# Patient Record
Sex: Female | Born: 1935 | Race: White | Hispanic: No | Smoking: Never smoker
Health system: Southern US, Community
[De-identification: ages and names within clinical notes are randomized; demographics above are authoritative.]

## PROBLEM LIST (undated history)

## (undated) DIAGNOSIS — J449 Chronic obstructive pulmonary disease, unspecified: Secondary | ICD-10-CM

## (undated) DIAGNOSIS — E46 Unspecified protein-calorie malnutrition: Secondary | ICD-10-CM

## (undated) DIAGNOSIS — J9620 Acute and chronic respiratory failure, unspecified whether with hypoxia or hypercapnia: Secondary | ICD-10-CM

## (undated) DIAGNOSIS — R600 Localized edema: Secondary | ICD-10-CM

## (undated) DIAGNOSIS — E039 Hypothyroidism, unspecified: Secondary | ICD-10-CM

## (undated) DIAGNOSIS — K589 Irritable bowel syndrome without diarrhea: Secondary | ICD-10-CM

## (undated) DIAGNOSIS — I10 Essential (primary) hypertension: Secondary | ICD-10-CM

## (undated) DIAGNOSIS — D509 Iron deficiency anemia, unspecified: Secondary | ICD-10-CM

## (undated) DIAGNOSIS — E079 Disorder of thyroid, unspecified: Secondary | ICD-10-CM

## (undated) DIAGNOSIS — R2681 Unsteadiness on feet: Secondary | ICD-10-CM

## (undated) DIAGNOSIS — I4891 Unspecified atrial fibrillation: Secondary | ICD-10-CM

## (undated) DIAGNOSIS — G4700 Insomnia, unspecified: Secondary | ICD-10-CM

## (undated) DIAGNOSIS — F329 Major depressive disorder, single episode, unspecified: Secondary | ICD-10-CM

## (undated) DIAGNOSIS — R1310 Dysphagia, unspecified: Secondary | ICD-10-CM

## (undated) DIAGNOSIS — N189 Chronic kidney disease, unspecified: Secondary | ICD-10-CM

## (undated) DIAGNOSIS — H532 Diplopia: Secondary | ICD-10-CM

## (undated) DIAGNOSIS — B370 Candidal stomatitis: Secondary | ICD-10-CM

## (undated) DIAGNOSIS — D72829 Elevated white blood cell count, unspecified: Secondary | ICD-10-CM

## (undated) DIAGNOSIS — J189 Pneumonia, unspecified organism: Secondary | ICD-10-CM

## (undated) DIAGNOSIS — R5381 Other malaise: Secondary | ICD-10-CM

## (undated) DIAGNOSIS — K224 Dyskinesia of esophagus: Secondary | ICD-10-CM

## (undated) DIAGNOSIS — F32A Depression, unspecified: Secondary | ICD-10-CM

## (undated) DIAGNOSIS — G7000 Myasthenia gravis without (acute) exacerbation: Secondary | ICD-10-CM

## (undated) DIAGNOSIS — K5900 Constipation, unspecified: Secondary | ICD-10-CM

## (undated) DIAGNOSIS — I480 Paroxysmal atrial fibrillation: Secondary | ICD-10-CM

## (undated) DIAGNOSIS — J309 Allergic rhinitis, unspecified: Secondary | ICD-10-CM

## (undated) DIAGNOSIS — R42 Dizziness and giddiness: Secondary | ICD-10-CM

## (undated) DIAGNOSIS — D649 Anemia, unspecified: Secondary | ICD-10-CM

## (undated) DIAGNOSIS — K219 Gastro-esophageal reflux disease without esophagitis: Secondary | ICD-10-CM

## (undated) HISTORY — PX: ABDOMINAL HYSTERECTOMY: SHX81

## (undated) HISTORY — DX: Diplopia: H53.2

## (undated) HISTORY — DX: Insomnia, unspecified: G47.00

## (undated) HISTORY — DX: Major depressive disorder, single episode, unspecified: F32.9

## (undated) HISTORY — DX: Myasthenia gravis without (acute) exacerbation: G70.00

## (undated) HISTORY — DX: Dyskinesia of esophagus: K22.4

## (undated) HISTORY — DX: Hypothyroidism, unspecified: E03.9

## (undated) HISTORY — PX: APPENDECTOMY: SHX54

## (undated) HISTORY — PX: TOTAL KNEE ARTHROPLASTY: SHX125

## (undated) HISTORY — DX: Dysphagia, unspecified: R13.10

## (undated) HISTORY — DX: Iron deficiency anemia, unspecified: D50.9

## (undated) HISTORY — DX: Unspecified atrial fibrillation: I48.91

## (undated) HISTORY — DX: Chronic kidney disease, unspecified: N18.9

## (undated) HISTORY — DX: Candidal stomatitis: B37.0

## (undated) HISTORY — PX: TOTAL HIP ARTHROPLASTY: SHX124

## (undated) HISTORY — DX: Acute and chronic respiratory failure, unspecified whether with hypoxia or hypercapnia: J96.20

## (undated) HISTORY — DX: Unsteadiness on feet: R26.81

## (undated) HISTORY — DX: Depression, unspecified: F32.A

## (undated) HISTORY — DX: Other malaise: R53.81

## (undated) HISTORY — DX: Allergic rhinitis, unspecified: J30.9

## (undated) HISTORY — DX: Unspecified protein-calorie malnutrition: E46

## (undated) HISTORY — DX: Paroxysmal atrial fibrillation: I48.0

## (undated) HISTORY — DX: Constipation, unspecified: K59.00

## (undated) HISTORY — DX: Gastro-esophageal reflux disease without esophagitis: K21.9

## (undated) HISTORY — DX: Irritable bowel syndrome, unspecified: K58.9

## (undated) HISTORY — DX: Localized edema: R60.0

## (undated) HISTORY — DX: Pneumonia, unspecified organism: J18.9

## (undated) HISTORY — DX: Disorder of thyroid, unspecified: E07.9

## (undated) HISTORY — DX: Elevated white blood cell count, unspecified: D72.829

## (undated) HISTORY — DX: Anemia, unspecified: D64.9

## (undated) HISTORY — DX: Chronic obstructive pulmonary disease, unspecified: J44.9

---

## 2011-05-15 DIAGNOSIS — R1900 Intra-abdominal and pelvic swelling, mass and lump, unspecified site: Secondary | ICD-10-CM | POA: Insufficient documentation

## 2011-05-15 DIAGNOSIS — Z800 Family history of malignant neoplasm of digestive organs: Secondary | ICD-10-CM | POA: Insufficient documentation

## 2011-10-05 DIAGNOSIS — I471 Supraventricular tachycardia: Secondary | ICD-10-CM | POA: Insufficient documentation

## 2011-10-05 DIAGNOSIS — I4719 Other supraventricular tachycardia: Secondary | ICD-10-CM | POA: Insufficient documentation

## 2012-03-24 DIAGNOSIS — Z0000 Encounter for general adult medical examination without abnormal findings: Secondary | ICD-10-CM | POA: Insufficient documentation

## 2012-09-07 DIAGNOSIS — R296 Repeated falls: Secondary | ICD-10-CM | POA: Insufficient documentation

## 2013-11-19 DIAGNOSIS — I272 Pulmonary hypertension, unspecified: Secondary | ICD-10-CM | POA: Insufficient documentation

## 2013-11-22 DIAGNOSIS — W19XXXA Unspecified fall, initial encounter: Secondary | ICD-10-CM | POA: Insufficient documentation

## 2013-11-22 DIAGNOSIS — Y92009 Unspecified place in unspecified non-institutional (private) residence as the place of occurrence of the external cause: Secondary | ICD-10-CM

## 2013-11-24 DIAGNOSIS — R001 Bradycardia, unspecified: Secondary | ICD-10-CM | POA: Insufficient documentation

## 2014-07-01 DIAGNOSIS — N390 Urinary tract infection, site not specified: Secondary | ICD-10-CM | POA: Insufficient documentation

## 2014-08-13 ENCOUNTER — Emergency Department (HOSPITAL_COMMUNITY)
Admission: EM | Admit: 2014-08-13 | Discharge: 2014-08-14 | Disposition: A | Discharge status: Home / Self Care | Payer: Medicare Other | Attending: Emergency Medicine | Admitting: Emergency Medicine

## 2014-08-13 ENCOUNTER — Emergency Department (HOSPITAL_COMMUNITY): Payer: Medicare Other

## 2014-08-13 ENCOUNTER — Encounter (HOSPITAL_COMMUNITY): Payer: Self-pay | Admitting: *Deleted

## 2014-08-13 DIAGNOSIS — Y998 Other external cause status: Secondary | ICD-10-CM | POA: Diagnosis not present

## 2014-08-13 DIAGNOSIS — W1830XA Fall on same level, unspecified, initial encounter: Secondary | ICD-10-CM | POA: Insufficient documentation

## 2014-08-13 DIAGNOSIS — Y92009 Unspecified place in unspecified non-institutional (private) residence as the place of occurrence of the external cause: Secondary | ICD-10-CM | POA: Diagnosis not present

## 2014-08-13 DIAGNOSIS — R300 Dysuria: Secondary | ICD-10-CM | POA: Insufficient documentation

## 2014-08-13 DIAGNOSIS — R0602 Shortness of breath: Secondary | ICD-10-CM | POA: Insufficient documentation

## 2014-08-13 DIAGNOSIS — W19XXXA Unspecified fall, initial encounter: Secondary | ICD-10-CM

## 2014-08-13 DIAGNOSIS — Y9389 Activity, other specified: Secondary | ICD-10-CM | POA: Diagnosis not present

## 2014-08-13 DIAGNOSIS — S0990XA Unspecified injury of head, initial encounter: Secondary | ICD-10-CM | POA: Diagnosis not present

## 2014-08-13 DIAGNOSIS — S199XXA Unspecified injury of neck, initial encounter: Secondary | ICD-10-CM | POA: Insufficient documentation

## 2014-08-13 HISTORY — DX: Disorder of thyroid, unspecified: E07.9

## 2014-08-13 HISTORY — DX: Essential (primary) hypertension: I10

## 2014-08-13 HISTORY — DX: Dizziness and giddiness: R42

## 2014-08-13 LAB — CBC
HCT: 37.5 % (ref 36.0–46.0)
Hemoglobin: 11.6 g/dL — ABNORMAL LOW (ref 12.0–15.0)
MCH: 25.8 pg — AB (ref 26.0–34.0)
MCHC: 30.9 g/dL (ref 30.0–36.0)
MCV: 83.3 fL (ref 78.0–100.0)
PLATELETS: 148 10*3/uL — AB (ref 150–400)
RBC: 4.5 MIL/uL (ref 3.87–5.11)
RDW: 14.8 % (ref 11.5–15.5)
WBC: 8.8 10*3/uL (ref 4.0–10.5)

## 2014-08-13 LAB — BASIC METABOLIC PANEL
ANION GAP: 9 (ref 5–15)
BUN: 20 mg/dL (ref 6–20)
CALCIUM: 8.9 mg/dL (ref 8.9–10.3)
CO2: 26 mmol/L (ref 22–32)
Chloride: 103 mmol/L (ref 101–111)
Creatinine, Ser: 0.59 mg/dL (ref 0.44–1.00)
GLUCOSE: 123 mg/dL — AB (ref 65–99)
POTASSIUM: 3.4 mmol/L — AB (ref 3.5–5.1)
Sodium: 138 mmol/L (ref 135–145)

## 2014-08-13 LAB — URINALYSIS, ROUTINE W REFLEX MICROSCOPIC
BILIRUBIN URINE: NEGATIVE
GLUCOSE, UA: NEGATIVE mg/dL
Hgb urine dipstick: NEGATIVE
KETONES UR: NEGATIVE mg/dL
NITRITE: NEGATIVE
PH: 6.5 (ref 5.0–8.0)
Protein, ur: NEGATIVE mg/dL
Specific Gravity, Urine: 1.016 (ref 1.005–1.030)
Urobilinogen, UA: 0.2 mg/dL (ref 0.0–1.0)

## 2014-08-13 LAB — URINE MICROSCOPIC-ADD ON

## 2014-08-13 LAB — I-STAT TROPONIN, ED: Troponin i, poc: 0.01 ng/mL (ref 0.00–0.08)

## 2014-08-13 LAB — D-DIMER, QUANTITATIVE (NOT AT ARMC): D DIMER QUANT: 2.18 ug{FEU}/mL — AB (ref 0.00–0.48)

## 2014-08-13 MED ORDER — SODIUM CHLORIDE 0.9 % IV BOLUS (SEPSIS)
1000.0000 mL | Freq: Once | INTRAVENOUS | Status: AC
  Administered 2014-08-13: 1000 mL via INTRAVENOUS

## 2014-08-13 MED ORDER — ACETAMINOPHEN 325 MG PO TABS
650.0000 mg | ORAL_TABLET | Freq: Once | ORAL | Status: AC
Start: 2014-08-13 — End: 2014-08-13
  Administered 2014-08-13: 650 mg via ORAL
  Filled 2014-08-13: qty 2

## 2014-08-13 MED ORDER — IOHEXOL 350 MG/ML SOLN
100.0000 mL | Freq: Once | INTRAVENOUS | Status: AC | PRN
  Administered 2014-08-13: 100 mL via INTRAVENOUS

## 2014-08-13 NOTE — ED Provider Notes (Signed)
CSN: [id number]     Arrival date & time 08/13/14  1940 History   First MD Initiated Contact with Patient 08/13/14 2007     Chief Complaint  Patient presents with  . Fall     (Consider location/radiation/quality/duration/timing/severity/associated sxs/prior Treatment) HPI  81 year old female presents from home after a fall. She states she has a long-standing history of vertigo and has been getting dizzy more more often. These falls have been ongoing for several months. Usually seen at [facility]. She states she's been taking her Antivert but with no relief. Today she has fallen 3 times, states that she is walking and it seems to feel lightheaded, no room spinning sensation. She denies passing out, but also seems to be having a hard time telling exactly what happened. She states she has been getting short of breath when this happens. Denies chest pain but does seem to think there is some palpitations involved. Denies a known history of arrhythmia or heart problems but is currently on amiodarone and she does not know why. The patient is a difficult historian. Does endorse dysuria but states she's had a UTI for over one year.  No past medical history on file. No past surgical history on file. No family history on file. History  Substance Use Topics  . Smoking status: Not on file  . Smokeless tobacco: Not on file  . Alcohol Use: Not on file   OB History    No data available     Review of Systems  Respiratory: Positive for shortness of breath.   Cardiovascular: Negative for chest pain and leg swelling.  Gastrointestinal: Negative for vomiting, abdominal pain and diarrhea.  Genitourinary: Positive for dysuria.  Musculoskeletal: Positive for arthralgias and neck pain.  Neurological: Positive for dizziness, light-headedness and headaches. Negative for syncope.  All other systems reviewed and are negative.     Allergies  Review of patient's allergies indicates not on file.  Home  Medications   Prior to Admission medications   Not on File   BP 154/78 mmHg  Pulse 62  Temp(Src) 98.5 F (36.9 C) (Oral)  Resp 20  SpO2 100% Physical Exam  Constitutional: She is oriented to person, place, and time. She appears well-developed and well-nourished. Cervical collar in place.  HENT:  Head: Normocephalic and atraumatic.  Right Ear: External ear normal.  Left Ear: External ear normal.  Nose: Nose normal.  Eyes: Right eye exhibits no discharge. Left eye exhibits no discharge.  Neck: Neck supple. Spinous process tenderness and muscular tenderness present.  Cardiovascular: Normal rate, regular rhythm and normal heart sounds.   No murmur heard. Pulmonary/Chest: Effort normal and breath sounds normal.  Abdominal: Soft. There is no tenderness.  Neurological: She is alert and oriented to person, place, and time.  CN 2-12 grossly intact. 5/5 strength in all 4 extremities. Normal finger to nose  Skin: Skin is warm and dry.  Vitals reviewed.   ED Course  Procedures (including critical care time) Labs Review Labs Reviewed  CBC - Abnormal; Notable for the following:    Hemoglobin 11.6 (*)    MCH 25.8 (*)    Platelets 148 (*)    All other components within normal limits  BASIC METABOLIC PANEL - Abnormal; Notable for the following:    Potassium 3.4 (*)    Glucose, Bld 123 (*)    All other components within normal limits  D-DIMER, QUANTITATIVE - Abnormal; Notable for the following:    D-Dimer, Quant 2.18 (*)    All  other components within normal limits  URINALYSIS, ROUTINE W REFLEX MICROSCOPIC  I-STAT TROPOININ, ED    Imaging Review Dg Chest 2 View  08/13/2014   CLINICAL DATA:  Dizziness, fall.  EXAM: CHEST  2 VIEW  COMPARISON:  None.  FINDINGS: Heart is borderline in size. No confluent airspace opacities or effusions. No acute bony abnormality.  IMPRESSION: Cardiomegaly.  No active disease.   Electronically Signed   By: Charlett Nose M.D.   On: 08/13/2014 20:39   Dg  Cervical Spine Complete  08/13/2014   CLINICAL DATA:  Patient with dizziness status post fall. Patient stated she hit her head. Complains of headaches.  EXAM: CERVICAL SPINE  4+ VIEWS  COMPARISON:  None.  FINDINGS: Visualization through the T1 vertebral body on the lateral view. Mild grade 1 anterolisthesis of C6 on C7, potentially secondary to degenerative facet changes. Relative preservation of the vertebral body and intervertebral disc space heights. Prevertebral soft tissues are unremarkable. Craniocervical junction is unremarkable. Lateral masses articulate appropriately with the dens. Nonspecific opacification within the right peritracheal region.  IMPRESSION: Markedly limited exam secondary to low bone density and difficulty with patient positioning. Grade 1 anterolisthesis of C6 on C7, potentially secondary to degenerative facet arthropathy. If there is persistent concern for nondisplaced cervical spine fracture, correlation with CT is recommended.  Opacification within the right peritracheal region visualized within the upper thorax. Recommend correlation with dedicated chest radiography and possible CT as clinically indicated.   Electronically Signed   By: Annia Belt M.D.   On: 08/13/2014 20:41   Ct Head Wo Contrast  08/13/2014   CLINICAL DATA:  Dizziness and fall, head injury. Now with headache. Multiple recent falls.  EXAM: CT HEAD WITHOUT CONTRAST  CT CERVICAL SPINE WITHOUT CONTRAST  TECHNIQUE: Multidetector CT imaging of the head and cervical spine was performed following the standard protocol without intravenous contrast. Multiplanar CT image reconstructions of the cervical spine were also generated.  COMPARISON:  None.  FINDINGS: CT HEAD FINDINGS  No intracranial hemorrhage, mass effect, or midline shift. No hydrocephalus. The basilar cisterns are patent. No evidence of territorial infarct. No intracranial fluid collection. Mild cerebral and cerebellar atrophy with moderate chronic small vessel  ischemic change. Calvarium is intact. Minimal opacification of left ethmoid air cells, paranasal sinuses are otherwise clear. Mastoid air cells are well aerated.  CT CERVICAL SPINE FINDINGS  Minimal degenerative-type anterolisthesis of C4 on C5 and C6 on C7. The alignment is otherwise maintained. There is near complete disc space loss and bony fusion of C7-T1, may be congenital. The cervical disc spaces are otherwise preserved, there is disc space narrowing at T1-T2. Vertebral body heights are preserved. There is no fracture. The dens is intact. There are no jumped or perched facets. There is multilevel diffuse facet arthropathy throughout the cervical spine. Mild endplate spurring throughout. No prevertebral soft tissue edema.  IMPRESSION: 1. No acute intracranial abnormality. Mild atrophy and moderate chronic small vessel ischemic change. 2. Degenerative change in the cervical spine without acute fracture or subluxation.   Electronically Signed   By: Rubye Oaks M.D.   On: 08/13/2014 22:34   Ct Angio Chest Pe W/cm &/or Wo Cm  08/14/2014   CLINICAL DATA:  Acute onset of dizziness and syncope. Elevated D-dimer. Initial encounter.  EXAM: CT ANGIOGRAPHY CHEST WITH CONTRAST  TECHNIQUE: Multidetector CT imaging of the chest was performed using the standard protocol during bolus administration of intravenous contrast. Multiplanar CT image reconstructions and MIPs were obtained to evaluate the  vascular anatomy.  CONTRAST:  100mL OMNIPAQUE IOHEXOL 350 MG/ML SOLN  COMPARISON:  Chest radiograph performed earlier today at 08:15 p.m.  FINDINGS: There is no evidence of pulmonary embolus.  Mild bibasilar atelectasis is noted. The lungs are otherwise clear. There is no evidence of significant focal consolidation, pleural effusion or pneumothorax. No masses are identified; no abnormal focal contrast enhancement is seen.  The thoracic aorta is borderline normal in size. Scattered coronary artery calcifications are seen. The  heart is mildly enlarged. Trace pericardial fluid remains within normal limits. The great vessels are grossly unremarkable in appearance. A 1.4 cm right paratracheal node is noted. No additional mediastinal lymphadenopathy is seen. No axillary lymphadenopathy is seen. The visualized portions of the thyroid gland are unremarkable in appearance.  The visualized portions of the liver and spleen are unremarkable.  No acute osseous abnormalities are seen. There is chronic osseous fusion at C7-T1. There is mild chronic compression deformity involving vertebral body T8, and the patient is status post vertebroplasty at T12.  Review of the MIP images confirms the above findings.  IMPRESSION: 1. No evidence of pulmonary embolus. 2. Mild bibasilar atelectasis noted; lungs otherwise clear. 3. Mild cardiomegaly. Scattered coronary artery calcifications seen. 4. 1.4 cm right paratracheal node noted. This is nonspecific and could reflect an underlying infectious or granulomatous process.   Electronically Signed   By: Roanna RaiderJeffery  Chang M.D.   On: 08/14/2014 00:25   Ct Cervical Spine Wo Contrast  08/13/2014   CLINICAL DATA:  Dizziness and fall, head injury. Now with headache. Multiple recent falls.  EXAM: CT HEAD WITHOUT CONTRAST  CT CERVICAL SPINE WITHOUT CONTRAST  TECHNIQUE: Multidetector CT imaging of the head and cervical spine was performed following the standard protocol without intravenous contrast. Multiplanar CT image reconstructions of the cervical spine were also generated.  COMPARISON:  None.  FINDINGS: CT HEAD FINDINGS  No intracranial hemorrhage, mass effect, or midline shift. No hydrocephalus. The basilar cisterns are patent. No evidence of territorial infarct. No intracranial fluid collection. Mild cerebral and cerebellar atrophy with moderate chronic small vessel ischemic change. Calvarium is intact. Minimal opacification of left ethmoid air cells, paranasal sinuses are otherwise clear. Mastoid air cells are well  aerated.  CT CERVICAL SPINE FINDINGS  Minimal degenerative-type anterolisthesis of C4 on C5 and C6 on C7. The alignment is otherwise maintained. There is near complete disc space loss and bony fusion of C7-T1, may be congenital. The cervical disc spaces are otherwise preserved, there is disc space narrowing at T1-T2. Vertebral body heights are preserved. There is no fracture. The dens is intact. There are no jumped or perched facets. There is multilevel diffuse facet arthropathy throughout the cervical spine. Mild endplate spurring throughout. No prevertebral soft tissue edema.  IMPRESSION: 1. No acute intracranial abnormality. Mild atrophy and moderate chronic small vessel ischemic change. 2. Degenerative change in the cervical spine without acute fracture or subluxation.   Electronically Signed   By: Rubye OaksMelanie  Ehinger M.D.   On: 08/13/2014 22:34     EKG Interpretation   Date/Time:  Friday Aug 13 2014 20:04:06 EDT Ventricular Rate:  60 PR Interval:  210 QRS Duration: 89 QT Interval:  483 QTC Calculation: 483 R Axis:   41 Text Interpretation:  Sinus rhythm Anteroseptal infarct, old Minimal ST  depression, anterolateral leads No old tracing to compare Confirmed by  Sigfredo Schreier  MD, Dequita Schleicher ([id number]) on 08/13/2014 8:53:34 PM      MDM   Final diagnoses:  Fall, initial encounter  Patient with frequent falls, son endorses that this is been ongoing for quite some time. No acute bony injury. [facility]. Did not pass out. Records viewed that show history of paroxysmal Afib but no palpitations or arrythmias noted currently. Echo from 8/15 shows no significant valvular disease. Given patient endorsed dyspnea a workup for PE was obtained and is negative. Discussed with hospitalist, Dr. Julian ReilGardner, who evaluated patient and after discussion with family they want to go home given this is more of a long-standing problem. D/c to nursing facility with strict return precautions and close f/u with  PCP.    Pricilla LovelessScott Karielle Davidow, MD 08/14/14 [id number]

## 2014-08-13 NOTE — ED Notes (Signed)
Pt in via EMS, pt was walking at nursing home and tripped and fell, unsure if she hit her head, c/o upper shoulder/neck pain, tender to palpation, denies LOC, alert and oriented, no distress noted

## 2014-08-13 NOTE — ED Notes (Signed)
Patient transported to X-ray 

## 2014-08-13 NOTE — ED Notes (Signed)
Per patient, she was feeling dizzy and fell, reports that she hit her head, c/o headaches, states she has had multiple episodes like this recently where she gets weak and falls, unsure of reason from her MD. Pt lives alone, fall was unwitnessed. Alert and oriented.

## 2014-08-13 NOTE — ED Notes (Signed)
[facility] Expected date:  Expected time:  Means of arrival:  Comments: EMS/fall/neck and back pain

## 2014-08-14 NOTE — ED Notes (Signed)
Pt ambulating independently w/ steady gait on d/c in no acute distress, A&Ox4.D/c instructions reviewed w/ pt and family - pt and family deny any further questions or concerns at present.  

## 2014-08-14 NOTE — Discharge Instructions (Signed)
Fall Prevention and Home Safety °Falls cause injuries and can affect all age groups. It is possible to use preventive measures to significantly decrease the likelihood of falls. There are many simple measures which can make your home safer and prevent falls. °OUTDOORS °· Repair cracks and edges of walkways and driveways. °· Remove high doorway thresholds. °· Trim shrubbery on the main path into your home. °· Have good outside lighting. °· Clear walkways of tools, rocks, debris, and clutter. °· Check that handrails are not broken and are securely fastened. Both sides of steps should have handrails. °· Have leaves, snow, and ice cleared regularly. °· Use sand or salt on walkways during winter months. °· In the garage, clean up grease or oil spills. °BATHROOM °· Install night lights. °· Install grab bars by the toilet and in the tub and shower. °· Use non-skid mats or decals in the tub or shower. °· Place a plastic non-slip stool in the shower to sit on, if needed. °· Keep floors dry and clean up all water on the floor immediately. °· Remove soap buildup in the tub or shower on a regular basis. °· Secure bath mats with non-slip, double-sided rug tape. °· Remove throw rugs and tripping hazards from the floors. °BEDROOMS °· Install night lights. °· Make sure a bedside light is easy to reach. °· Do not use oversized bedding. °· Keep a telephone by your bedside. °· Have a firm chair with side arms to use for getting dressed. °· Remove throw rugs and tripping hazards from the floor. °KITCHEN °· Keep handles on pots and pans turned toward the center of the stove. Use back burners when possible. °· Clean up spills quickly and allow time for drying. °· Avoid walking on wet floors. °· Avoid hot utensils and knives. °· Position shelves so they are not too high or low. °· Place commonly used objects within easy reach. °· If necessary, use a sturdy step stool with a grab bar when reaching. °· Keep electrical cables out of the  way. °· Do not use floor polish or wax that makes floors slippery. If you must use wax, use non-skid floor wax. °· Remove throw rugs and tripping hazards from the floor. °STAIRWAYS °· Never leave objects on stairs. °· Place handrails on both sides of stairways and use them. Fix any loose handrails. Make sure handrails on both sides of the stairways are as long as the stairs. °· Check carpeting to make sure it is firmly attached along stairs. Make repairs to worn or loose carpet promptly. °· Avoid placing throw rugs at the top or bottom of stairways, or properly secure the rug with carpet tape to prevent slippage. Get rid of throw rugs, if possible. °· Have an electrician put in a light switch at the top and bottom of the stairs. °OTHER FALL PREVENTION TIPS °· Wear low-heel or rubber-soled shoes that are supportive and fit well. Wear closed toe shoes. °· When using a stepladder, make sure it is fully opened and both spreaders are firmly locked. Do not climb a closed stepladder. °· Add color or contrast paint or tape to grab bars and handrails in your home. Place contrasting color strips on first and last steps. °· Learn and use mobility aids as needed. Install an electrical emergency response system. °· Turn on lights to avoid dark areas. Replace light bulbs that burn out immediately. Get light switches that glow. °· Arrange furniture to create clear pathways. Keep furniture in the same place. °·   Firmly attach carpet with non-skid or double-sided tape. °· Eliminate uneven floor surfaces. °· Select a carpet pattern that does not visually hide the edge of steps. °· Be aware of all pets. °OTHER HOME SAFETY TIPS °· Set the water temperature for 120° F (48.8° C). °· Keep emergency numbers on or near the telephone. °· Keep smoke detectors on every level of the home and near sleeping areas. °Document Released: 03/02/2002 Document Revised: 09/11/2011 Document Reviewed: 06/01/2011 °Exit[employer]® Patient Information ©2015  [employer]. This information is not intended to replace advice given to you by your health care provider. Make sure you discuss any questions you have with your health care provider. ° °Fall Prevention in Hospitals °As a hospital patient, your condition and the treatments you receive can increase your risk for falls. Some additional risk factors for falls in a hospital include: °· Being in an unfamiliar environment. °· Being on bed rest. °· Your surgery. °· Taking certain medicines. °· Your tubing requirements, such as intravenous (IV) therapy or catheters. °It is important that you learn how to decrease fall risks while at the hospital. Below are important tips that can help prevent falls. °SAFETY TIPS FOR PREVENTING FALLS °Talk about your risk of falling. °· Ask your caregiver why you are at risk for falling. Is it your medicine, illness, tubing placement, or something else? °· Make a plan with your caregiver to keep you safe from falls. °· Ask your caregiver or pharmacist about side effect of your medicines. Some medicines can make you dizzy or affect your coordination. °Ask for help. °· Ask for help before getting out of bed. You may need to press your call button. °· Ask for assistance in getting you safely to the toilet. °· Ask for a walker or cane to be put at your bedside. Ask that most of the side rails on your bed be placed up before your caregiver leaves the room. °· Ask family or friends to sit with you. °· Ask for things that are out of your reach, such as your glasses, hearing aids, telephone, bedside table, or call button. °Follow these tips to avoid falling: °· Stay lying or seated, rather than standing, while waiting for help. °· Wear rubber-soled slippers or shoes whenever you walk in the hospital. °· Avoid quick, sudden movements. °¨ Change positions slowly. °¨ Sit on the side of your bed before standing. °¨ Stand up slowly and wait before you start to walk. °· Let your caregiver know if there  is a spill on the floor. °· Pay careful attention to the medical equipment, electrical cords, and tubes around you. °· When you need help, use your call button by your bed or in the bathroom. Wait for one of your caregivers to help you. °· If you feel dizzy or unsure of your footing, return to bed and wait for assistance. °· Avoid being distracted by the TV, telephone, or another person in your room. °· Do not lean or support yourself on rolling objects, such as IV poles or bedside tables. °Document Released: 03/09/2000 Document Revised: 02/27/2012 Document Reviewed: 11/18/2011 °ExitCare® Patient Information ©2015 [employer]. This information is not intended to replace advice given to you by your health care provider. Make sure you discuss any questions you have with your health care provider. ° °

## 2014-08-15 LAB — URINE CULTURE
CULTURE: NO GROWTH
Colony Count: NO GROWTH

## 2014-08-19 ENCOUNTER — Emergency Department (HOSPITAL_COMMUNITY): Payer: Medicare Other

## 2014-08-19 ENCOUNTER — Encounter (HOSPITAL_COMMUNITY): Payer: Self-pay | Admitting: Emergency Medicine

## 2014-08-19 ENCOUNTER — Emergency Department (HOSPITAL_COMMUNITY)
Admission: EM | Admit: 2014-08-19 | Discharge: 2014-08-19 | Disposition: A | Discharge status: Home / Self Care | Payer: Medicare Other | Attending: Emergency Medicine | Admitting: Emergency Medicine

## 2014-08-19 DIAGNOSIS — Y9389 Activity, other specified: Secondary | ICD-10-CM | POA: Diagnosis not present

## 2014-08-19 DIAGNOSIS — Z79899 Other long term (current) drug therapy: Secondary | ICD-10-CM | POA: Diagnosis not present

## 2014-08-19 DIAGNOSIS — Y9289 Other specified places as the place of occurrence of the external cause: Secondary | ICD-10-CM | POA: Insufficient documentation

## 2014-08-19 DIAGNOSIS — W01198A Fall on same level from slipping, tripping and stumbling with subsequent striking against other object, initial encounter: Secondary | ICD-10-CM | POA: Diagnosis not present

## 2014-08-19 DIAGNOSIS — Z792 Long term (current) use of antibiotics: Secondary | ICD-10-CM | POA: Diagnosis not present

## 2014-08-19 DIAGNOSIS — Z7982 Long term (current) use of aspirin: Secondary | ICD-10-CM | POA: Diagnosis not present

## 2014-08-19 DIAGNOSIS — S0990XA Unspecified injury of head, initial encounter: Secondary | ICD-10-CM | POA: Diagnosis present

## 2014-08-19 DIAGNOSIS — Z7951 Long term (current) use of inhaled steroids: Secondary | ICD-10-CM | POA: Insufficient documentation

## 2014-08-19 DIAGNOSIS — Y998 Other external cause status: Secondary | ICD-10-CM | POA: Insufficient documentation

## 2014-08-19 DIAGNOSIS — I10 Essential (primary) hypertension: Secondary | ICD-10-CM | POA: Diagnosis not present

## 2014-08-19 DIAGNOSIS — E079 Disorder of thyroid, unspecified: Secondary | ICD-10-CM | POA: Insufficient documentation

## 2014-08-19 LAB — CBG MONITORING, ED: Glucose-Capillary: 97 mg/dL (ref 65–99)

## 2014-08-19 MED ORDER — ACETAMINOPHEN 325 MG PO TABS
650.0000 mg | ORAL_TABLET | Freq: Once | ORAL | Status: AC
  Administered 2014-08-19: 650 mg via ORAL
  Filled 2014-08-19: qty 2

## 2014-08-19 NOTE — ED Notes (Signed)
Bed: WA17 Expected date:  Expected time:  Means of arrival:  Comments: resus

## 2014-08-19 NOTE — ED Notes (Signed)
Pt also reports that yesterday she had some emesis, but none today

## 2014-08-19 NOTE — ED Provider Notes (Signed)
CSN: [id number]     Arrival date & time 08/19/14  1341 History   First MD Initiated Contact with Patient 08/19/14 1357     Chief Complaint  Patient presents with  . Fall      HPI Patient has a long-standing history of frequent falls.  She had another fall today at the nursing facility when she was transferring to a chair.  She fell and struck the back of her head.  She presents now with a small hematoma to her posterior scalp.  Patient's family is requesting evaluation of the hematoma for underlying injury.  The patient has long-standing history of frequent falls and the family does not want this worked up any further.  This is been occurring for approximately one year.  Her doctors are well aware of this and have done extensive workup.  Patient reports mild headache this time.  She denies neck pain.  No weakness of her arms or legs.  No chest pain shortness breath.  Denies abdominal pain.   Past Medical History  Diagnosis Date  . Hypertension   . Thyroid disease   . Vertigo    History reviewed. No pertinent past surgical history. History reviewed. No pertinent family history. History  Substance Use Topics  . Smoking status: Never Smoker   . Smokeless tobacco: Not on file  . Alcohol Use: Not on file   OB History    No data available     Review of Systems  All other systems reviewed and are negative.     Allergies  Review of patient's allergies indicates no known allergies.  Home Medications   Prior to Admission medications   Medication Sig Start Date End Date Taking? Authorizing Provider  acetaminophen (TYLENOL) 325 MG tablet Take 325 mg by mouth every 4 (four) hours as needed for mild pain, moderate pain, fever or headache.   Yes Historical Provider, MD  acidophilus (RISAQUAD) CAPS capsule Take 1 capsule by mouth 3 (three) times daily.   Yes Historical Provider, MD  amiodarone (PACERONE) 200 MG tablet Take 200 mg by mouth 2 (two) times daily.   Yes Historical Provider,  MD  amitriptyline (ELAVIL) 25 MG tablet Take 25 mg by mouth at bedtime.   Yes Historical Provider, MD  aspirin 325 MG tablet Take 325 mg by mouth daily.   Yes Historical Provider, MD  cholecalciferol (VITAMIN D) 1000 UNITS tablet Take 2,000 Units by mouth daily.   Yes Historical Provider, MD  doxycycline (VIBRA-TABS) 100 MG tablet Take 100 mg by mouth 2 (two) times daily.   Yes Historical Provider, MD  esomeprazole (NEXIUM) 40 MG capsule Take 40 mg by mouth daily at 12 noon.   Yes Historical Provider, MD  fluticasone (FLONASE) 50 MCG/ACT nasal spray Place 1 spray into both nostrils 2 (two) times daily.   Yes Historical Provider, MD  levothyroxine (SYNTHROID, LEVOTHROID) 75 MCG tablet Take 75 mcg by mouth daily before breakfast.   Yes Historical Provider, MD  meclizine (ANTIVERT) 12.5 MG tablet Take 12.5 mg by mouth 2 (two) times daily.   Yes Historical Provider, MD  metoprolol (LOPRESSOR) 50 MG tablet Take 50 mg by mouth daily.   Yes Historical Provider, MD  Multiple Vitamins-Minerals (MULTIVITAMIN GUMMIES ADULT PO) Take 1 tablet by mouth daily.   Yes Historical Provider, MD  ondansetron (ZOFRAN) 4 MG tablet Take 4 mg by mouth every 6 (six) hours as needed for nausea or vomiting.   Yes Historical Provider, MD  PARoxetine (PAXIL) 40 MG tablet  Take 40 mg by mouth at bedtime.   Yes Historical Provider, MD  polyethylene glycol (MIRALAX / GLYCOLAX) packet Take 17 g by mouth every Monday, Wednesday, and Friday.   Yes Historical Provider, MD  senna (SENOKOT) 8.6 MG tablet Take 1 tablet by mouth 2 (two) times daily.   Yes Historical Provider, MD  sulfamethoxazole-trimethoprim (BACTRIM DS,SEPTRA DS) 800-160 MG per tablet Take 1 tablet by mouth 2 (two) times daily.   Yes Historical Provider, MD  traZODone (DESYREL) 100 MG tablet Take 100 mg by mouth at bedtime.   Yes Historical Provider, MD  vitamin C (ASCORBIC ACID) 500 MG tablet Take 500 mg by mouth daily.   Yes Historical Provider, MD   BP 94/54 mmHg   Pulse 43  Temp(Src) 98.8 F (37.1 C) (Oral)  Resp 16  SpO2 94% Physical Exam  Constitutional: She is oriented to person, place, and time. She appears well-developed and well-nourished. No distress.  HENT:  Head: Normocephalic.  Small hematoma to posterior left parietal scalp.  No laceration.  Eyes: EOM are normal.  Neck: Normal range of motion.  No C-spine tenderness or step-off.  C-spine cleared by [facility].  Cardiovascular: Normal rate, regular rhythm and normal heart sounds.   Pulmonary/Chest: Effort normal and breath sounds normal.  Abdominal: Soft. She exhibits no distension. There is no tenderness.  Musculoskeletal: Normal range of motion.  Neurological: She is alert and oriented to person, place, and time.  Skin: Skin is warm and dry.  Psychiatric: She has a normal mood and affect. Judgment normal.  Nursing note and vitals reviewed.   ED Course  Procedures (including critical care time) Labs Review Labs Reviewed  CBG MONITORING, ED    Imaging Review No results found.   EKG Interpretation   Date/Time:  Thursday Aug 19 2014 13:47:19 EDT Ventricular Rate:  59 PR Interval:  243 QRS Duration: 90 QT Interval:  470 QTC Calculation: 466 R Axis:   -16 Text Interpretation:  Sinus rhythm Prolonged PR interval Borderline left  axis deviation No significant change was found Confirmed by Harmon Bommarito  MD,  Caryn Bee ([id number]) on 08/19/2014 3:19:57 PM      MDM   Final diagnoses:  None    Patient will undergo CT imaging of her head.  This is negative she will be discharged home.  She'll continue to follow-up with her primary care physicians.  I recommended that with her increasing frequent falls over the past year the family may want to move the patient to wheelchair for any movement.  Azalia Bilis, MD 08/19/14 [phone]

## 2014-08-19 NOTE — Progress Notes (Signed)
CSW attempted to meet with pt at bedside. However, she was transported to Radiology. Son was present. Son confirms [facility]. Also, he confirms that the pt is from [facility].  Son states that the pt falls often. He states that she receives assistance from staff with completing ADL's. She states that she can usually put on her own clothes.  Son informed CSW that the pt receives PT X3 a week while at the facility.  Paula DublinSon/Guy Bergemann [phone]  Trish MageBrittney Lorrine Killilea, LCSWA [phone] ED CSW 08/19/2014 6:26 PM

## 2014-08-19 NOTE — ED Notes (Signed)
Pt from [facility] [address]. Hx dementia, oriented per norm. Pt was eating in dining room, was standing up but slipped instead of standing. Pt has knot on the back of her head. Pt on aspirin, no other thinners. Pt also complains of dizziness, but says she is normally dizzy. Also complains of neck pain, but has chronic neck pain. EMS reports pt's HR varied between low 40s and 60s during transport.

## 2014-08-19 NOTE — ED Notes (Signed)
Bed: RESA Expected date:  Expected time:  Means of arrival:  Comments: 81 y/o F fall

## 2014-08-20 ENCOUNTER — Encounter (HOSPITAL_COMMUNITY): Payer: Self-pay | Admitting: Emergency Medicine

## 2014-08-20 ENCOUNTER — Emergency Department (HOSPITAL_COMMUNITY): Payer: Medicare Other

## 2014-08-20 ENCOUNTER — Emergency Department (HOSPITAL_COMMUNITY)
Admission: EM | Admit: 2014-08-20 | Discharge: 2014-08-20 | Disposition: A | Discharge status: Home / Self Care | Payer: Medicare Other | Attending: Emergency Medicine | Admitting: Emergency Medicine

## 2014-08-20 DIAGNOSIS — Y998 Other external cause status: Secondary | ICD-10-CM | POA: Insufficient documentation

## 2014-08-20 DIAGNOSIS — Z7951 Long term (current) use of inhaled steroids: Secondary | ICD-10-CM | POA: Diagnosis not present

## 2014-08-20 DIAGNOSIS — S60811A Abrasion of right wrist, initial encounter: Secondary | ICD-10-CM | POA: Insufficient documentation

## 2014-08-20 DIAGNOSIS — S065X0A Traumatic subdural hemorrhage without loss of consciousness, initial encounter: Secondary | ICD-10-CM | POA: Diagnosis not present

## 2014-08-20 DIAGNOSIS — Z79899 Other long term (current) drug therapy: Secondary | ICD-10-CM | POA: Insufficient documentation

## 2014-08-20 DIAGNOSIS — Z7982 Long term (current) use of aspirin: Secondary | ICD-10-CM | POA: Diagnosis not present

## 2014-08-20 DIAGNOSIS — W1839XA Other fall on same level, initial encounter: Secondary | ICD-10-CM | POA: Diagnosis not present

## 2014-08-20 DIAGNOSIS — Z23 Encounter for immunization: Secondary | ICD-10-CM | POA: Insufficient documentation

## 2014-08-20 DIAGNOSIS — F0390 Unspecified dementia without behavioral disturbance: Secondary | ICD-10-CM | POA: Insufficient documentation

## 2014-08-20 DIAGNOSIS — S199XXA Unspecified injury of neck, initial encounter: Secondary | ICD-10-CM | POA: Insufficient documentation

## 2014-08-20 DIAGNOSIS — S51811A Laceration without foreign body of right forearm, initial encounter: Secondary | ICD-10-CM | POA: Diagnosis not present

## 2014-08-20 DIAGNOSIS — Y9389 Activity, other specified: Secondary | ICD-10-CM | POA: Insufficient documentation

## 2014-08-20 DIAGNOSIS — E039 Hypothyroidism, unspecified: Secondary | ICD-10-CM | POA: Diagnosis not present

## 2014-08-20 DIAGNOSIS — I10 Essential (primary) hypertension: Secondary | ICD-10-CM | POA: Diagnosis not present

## 2014-08-20 DIAGNOSIS — S0990XA Unspecified injury of head, initial encounter: Secondary | ICD-10-CM | POA: Diagnosis present

## 2014-08-20 DIAGNOSIS — Y92008 Other place in unspecified non-institutional (private) residence as the place of occurrence of the external cause: Secondary | ICD-10-CM | POA: Diagnosis not present

## 2014-08-20 DIAGNOSIS — S065X9A Traumatic subdural hemorrhage with loss of consciousness of unspecified duration, initial encounter: Secondary | ICD-10-CM

## 2014-08-20 DIAGNOSIS — R42 Dizziness and giddiness: Secondary | ICD-10-CM

## 2014-08-20 DIAGNOSIS — Z792 Long term (current) use of antibiotics: Secondary | ICD-10-CM | POA: Insufficient documentation

## 2014-08-20 DIAGNOSIS — S065XAA Traumatic subdural hemorrhage with loss of consciousness status unknown, initial encounter: Secondary | ICD-10-CM

## 2014-08-20 LAB — BASIC METABOLIC PANEL
Anion gap: 12 (ref 5–15)
BUN: 24 mg/dL — ABNORMAL HIGH (ref 6–20)
CALCIUM: 9.1 mg/dL (ref 8.9–10.3)
CO2: 22 mmol/L (ref 22–32)
CREATININE: 0.41 mg/dL — AB (ref 0.44–1.00)
Chloride: 103 mmol/L (ref 101–111)
GFR calc Af Amer: >60 mL/min (ref >60)
GFR calc non Af Amer: >60 mL/min (ref >60)
Glucose, Bld: 131 mg/dL — ABNORMAL HIGH (ref 65–99)
POTASSIUM: 4.2 mmol/L (ref 3.5–5.1)
Sodium: 137 mmol/L (ref 135–145)

## 2014-08-20 LAB — CBC
HEMATOCRIT: 38.3 % (ref 36.0–46.0)
Hemoglobin: 12.1 g/dL (ref 12.0–15.0)
MCH: 26.2 pg (ref 26.0–34.0)
MCHC: 31.6 g/dL (ref 30.0–36.0)
MCV: 83.1 fL (ref 78.0–100.0)
Platelets: 180 10*3/uL (ref 150–400)
RBC: 4.61 MIL/uL (ref 3.87–5.11)
RDW: 14.8 % (ref 11.5–15.5)
WBC: 6.7 10*3/uL (ref 4.0–10.5)

## 2014-08-20 MED ORDER — TETANUS-DIPHTH-ACELL PERTUSSIS 5-2.5-18.5 LF-MCG/0.5 IM SUSP
0.5000 mL | Freq: Once | INTRAMUSCULAR | Status: AC
  Administered 2014-08-20: 0.5 mL via INTRAMUSCULAR
  Filled 2014-08-20: qty 0.5

## 2014-08-20 MED ORDER — ACETAMINOPHEN 325 MG PO TABS
650.0000 mg | ORAL_TABLET | Freq: Once | ORAL | Status: AC
  Administered 2014-08-20: 650 mg via ORAL
  Filled 2014-08-20: qty 2

## 2014-08-20 NOTE — ED Notes (Signed)
Bed: [id number] Expected date:  Expected time:  Means of arrival:  Comments: EMS- 81yo F, dizzy/fall, no thinners

## 2014-08-20 NOTE — ED Notes (Signed)
All paperwork sent with [facility]AR.

## 2014-08-20 NOTE — ED Notes (Addendum)
Per EMS-from [facility], c/o multiple falls over the past few days d/t dizziness. Unknown if patient has hx dementia. Alert with some memory impairment. [facility] staff reports patient's orientation is baseline. In c-collar, reported cervical spine pain on palpation. C/o lumbar pain but this pain is not new/worse. Reports she is usually on Trazadone but this medication was stopped approximately a week ago and patient reports decreased rest/sleep. Able to stand. Orthostatic vitals-negative. VS: CBG 135 mg/dl, 12 lead EKG unremarkable (SR), HR 60. Also c/o nausea.   20 G PIV in right FA. 4 mg Zofran given.

## 2014-08-20 NOTE — Progress Notes (Signed)
Pt seen by this ED CM on 08/19/14 after no pcp was listed.  Cm called and spoke with med tech to find out pt is seen by Dr Florentina JennyHenry Tripp - house MD. CM had updated this information and provided contact information for Dr Redmond Schoolripp to son that was at bedside and  he stated he did not have name of facility MD. Son was at bedside Reports he was just returning back to town from a trip and drove directly to ED after called from Facility. Son reported pt had therapy seeing her 3 times a week and pt & son confirmed she has a walker and a w/c.  Cm had recommended to son that the pt start to use her w/c more to prevent future falls   08/20/14 1131 Cm spoke with ED SW about pt after noting 3 ED visits in last 6 months - mostly in May 2016 for falls - ? Higher level of care. SW confirms facility offers Assisted living services.  No son in pt room , nursing states son not present with pt 1143 Received call from ED SW who spoke with facility staff Pt is forgetting to use walker. Facility will increase checks on pt and leave her room door open and son will encourage use of w/c vs walker to possible prevent further falls

## 2014-08-20 NOTE — ED Provider Notes (Signed)
CSN: [id number]     Arrival date & time 08/20/14  0754 History   First MD Initiated Contact with Patient 08/20/14 [phone]     Chief Complaint  Patient presents with  . Fall  . Dizziness   Level V caveat dementia History is obtained from patient's son, Amilia Vandenbrink and from nurse Milderd Meager at assisted-living facility. (Consider location/radiation/quality/duration/timing/severity/associated sxs/prior Treatment) HPI Patient was found in her room 6 AM today after she had fallen in her room. She has had several falls over the past 3 days. Seen here yesterday had CT scan of the brain, released. Patient had not been falling until 3 days ago. She was started on meclizine 12.5 mg twice a day earlier this week for dizziness. As result today's fall she suffered a skin once to left forearm and right wrist. EMS treated patient with Zofran 4 mg IV while in route. Endocrine please patient heart cervical collar. Patient denies pain anywhere presently. Past Medical History  Diagnosis Date  . Hypertension   . Thyroid disease   . Vertigo    dementia History reviewed. No pertinent past surgical history. History reviewed. No pertinent family history. History  Substance Use Topics  . Smoking status: Never Smoker   . Smokeless tobacco: Not on file  . Alcohol Use: Not on file    lives in assisted living OB History    No data available     Review of Systems  Unable to perform ROS Musculoskeletal: Positive for gait problem.       Use his walker and wheelchair  Skin: Positive for wound.  Neurological: Positive for dizziness.    unable to perform complete review of systems, dementia   Allergies  Review of patient's allergies indicates no known allergies.  Home Medications   Prior to Admission medications   Medication Sig Start Date End Date Taking? Authorizing Provider  acetaminophen (TYLENOL) 325 MG tablet Take 325 mg by mouth every 4 (four) hours as needed for mild pain, moderate pain,  fever or headache.   Yes Historical Provider, MD  acidophilus (RISAQUAD) CAPS capsule Take 1 capsule by mouth 3 (three) times daily.   Yes Historical Provider, MD  amiodarone (PACERONE) 200 MG tablet Take 200 mg by mouth 2 (two) times daily.   Yes Historical Provider, MD  amitriptyline (ELAVIL) 25 MG tablet Take 25 mg by mouth at bedtime.   Yes Historical Provider, MD  aspirin 325 MG tablet Take 325 mg by mouth daily.   Yes Historical Provider, MD  cholecalciferol (VITAMIN D) 1000 UNITS tablet Take 2,000 Units by mouth daily.   Yes Historical Provider, MD  doxycycline (VIBRA-TABS) 100 MG tablet Take 100 mg by mouth 2 (two) times daily.   Yes Historical Provider, MD  esomeprazole (NEXIUM) 40 MG capsule Take 40 mg by mouth daily at 12 noon.   Yes Historical Provider, MD  fluticasone (FLONASE) 50 MCG/ACT nasal spray Place 1 spray into both nostrils 2 (two) times daily.   Yes Historical Provider, MD  levothyroxine (SYNTHROID, LEVOTHROID) 75 MCG tablet Take 75 mcg by mouth daily before breakfast.   Yes Historical Provider, MD  meclizine (ANTIVERT) 12.5 MG tablet Take 12.5 mg by mouth 2 (two) times daily.   Yes Historical Provider, MD  metoprolol (LOPRESSOR) 50 MG tablet Take 50 mg by mouth daily.   Yes Historical Provider, MD  Multiple Vitamins-Minerals (MULTIVITAMIN GUMMIES ADULT PO) Take 1 tablet by mouth daily.   Yes Historical Provider, MD  ondansetron (ZOFRAN) 4 MG tablet Take  4 mg by mouth every 6 (six) hours as needed for nausea or vomiting.   Yes Historical Provider, MD  PARoxetine (PAXIL) 40 MG tablet Take 40 mg by mouth at bedtime.   Yes Historical Provider, MD  polyethylene glycol (MIRALAX / GLYCOLAX) packet Take 17 g by mouth every Monday, Wednesday, and Friday.   Yes Historical Provider, MD  senna (SENOKOT) 8.6 MG tablet Take 1 tablet by mouth 2 (two) times daily.   Yes Historical Provider, MD  sulfamethoxazole-trimethoprim (BACTRIM DS,SEPTRA DS) 800-160 MG per tablet Take 1 tablet by mouth  2 (two) times daily.   Yes Historical Provider, MD  traZODone (DESYREL) 100 MG tablet Take 100 mg by mouth at bedtime.   Yes Historical Provider, MD  vitamin C (ASCORBIC ACID) 500 MG tablet Take 500 mg by mouth daily.   Yes Historical Provider, MD   BP 109/91 mmHg  Pulse 60  Temp(Src) 98.4 F (36.9 C) (Oral)  Resp 20  SpO2 94% Physical Exam  Constitutional: She appears well-developed and well-nourished.  HENT:  Head: Normocephalic and atraumatic.  Eyes: Conjunctivae are normal. Pupils are equal, round, and reactive to light.  Neck: Neck supple. No tracheal deviation present. No thyromegaly present.  Cardiovascular: Normal rate and regular rhythm.   No murmur heard. Pulmonary/Chest: Effort normal and breath sounds normal.  Abdominal: Soft. Bowel sounds are normal. She exhibits no distension. There is no tenderness.  Musculoskeletal: Normal range of motion. She exhibits tenderness. She exhibits no edema.  Cervical spine tender. Thoracic and lumbar spine nontender. Pelvis stable nontender. All 4 extremities without deformity or tenderness neurovascularly intact  Neurological: She is alert. Coordination normal.  Oriented to name and hospital states month is June, year 2016. Motor strength 5 over 5 overall cranial nerves II through XII grossly intact. Moves all extremities follow simple commands complains of dizziness when changing position in bed  Skin: Skin is warm and dry. No rash noted.  5 cm laceration, superficial over the dorsal aspect of forearm. Dime-sized abrasion over dorsal right wrist. No active bleeding  Psychiatric: She has a normal mood and affect.  Nursing note and vitals reviewed.   ED Course  Procedures (including critical care time) Labs Review Labs Reviewed  CBC  BASIC METABOLIC PANEL    Imaging Review Ct Head Wo Contrast  08/19/2014   CLINICAL DATA:  Dementia.  Dizziness after fall  EXAM: CT HEAD WITHOUT CONTRAST  TECHNIQUE: Contiguous axial images were  obtained from the base of the skull through the vertex without intravenous contrast.  COMPARISON:  Aug 13, 2014  FINDINGS: Mild diffuse atrophy is stable. There is no intracranial mass, hemorrhage, extra-axial fluid collection, or midline shift. There is patchy small vessel disease throughout the centra semiovale bilaterally, stable. No new gray-white compartment lesion. No acute infarct.  Bony calvarium appears intact. The mastoid air cells are clear. There is advanced osteoarthritic change with bony remodeling in the right mandibular condyle/right temporomandibular joint region. There is opacification of a posterior left ethmoid air cell, stable.  IMPRESSION: Stable atrophy with supratentorial small vessel disease. No intracranial mass, hemorrhage, or extra-axial fluid collection. No acute infarct. Advanced arthropathy in the right temporomandibular joint. Stable opacification of a left posterior ethmoid air cell.   Electronically Signed   By: Bretta Bang III M.D.   On: 08/19/2014 16:28     EKG Interpretation None     patient noted to have subdural hematoma on MRI scan discussed with Dr. Alfredo Batty.I also Fish farm manager. In his opinion ,  Patient not requiring surgery readily. Subdural hematoma small. He feels that she can be discharged back to assisted living facility with arrangements to have repeat CT scan in 1 week to check for expansion. He will check on CT scan and CT patient if subdural hematoma changes.Tap administered Results for orders placed or performed during the hospital encounter of 08/20/14  CBC  Result Value Ref Range   WBC 6.7 4.0 - 10.5 K/uL   RBC 4.61 3.87 - 5.11 MIL/uL   Hemoglobin 12.1 12.0 - 15.0 g/dL   HCT 16.1 09.6 - 04.5 %   MCV 83.1 78.0 - 100.0 fL   MCH 26.2 26.0 - 34.0 pg   MCHC 31.6 30.0 - 36.0 g/dL   RDW 40.9 81.1 - 91.4 %   Platelets 180 150 - 400 K/uL  Basic metabolic panel  Result Value Ref Range   Sodium 137 135 - 145 mmol/L   Potassium 4.2 3.5 - 5.1  mmol/L   Chloride 103 101 - 111 mmol/L   CO2 22 22 - 32 mmol/L   Glucose, Bld 131 (H) 65 - 99 mg/dL   BUN 24 (H) 6 - 20 mg/dL   Creatinine, Ser 7.82 (L) 0.44 - 1.00 mg/dL   Calcium 9.1 8.9 - 95.6 mg/dL   GFR calc non Af Amer >60 >60 mL/min   GFR calc Af Amer >60 >60 mL/min   Anion gap 12 5 - 15   Dg Chest 2 View  08/13/2014   CLINICAL DATA:  Dizziness, fall.  EXAM: CHEST  2 VIEW  COMPARISON:  None.  FINDINGS: Heart is borderline in size. No confluent airspace opacities or effusions. No acute bony abnormality.  IMPRESSION: Cardiomegaly.  No active disease.   Electronically Signed   By: Charlett Nose M.D.   On: 08/13/2014 20:39   Dg Cervical Spine Complete  08/13/2014   CLINICAL DATA:  Patient with dizziness status post fall. Patient stated she hit her head. Complains of headaches.  EXAM: CERVICAL SPINE  4+ VIEWS  COMPARISON:  None.  FINDINGS: Visualization through the T1 vertebral body on the lateral view. Mild grade 1 anterolisthesis of C6 on C7, potentially secondary to degenerative facet changes. Relative preservation of the vertebral body and intervertebral disc space heights. Prevertebral soft tissues are unremarkable. Craniocervical junction is unremarkable. Lateral masses articulate appropriately with the dens. Nonspecific opacification within the right peritracheal region.  IMPRESSION: Markedly limited exam secondary to low bone density and difficulty with patient positioning. Grade 1 anterolisthesis of C6 on C7, potentially secondary to degenerative facet arthropathy. If there is persistent concern for nondisplaced cervical spine fracture, correlation with CT is recommended.  Opacification within the right peritracheal region visualized within the upper thorax. Recommend correlation with dedicated chest radiography and possible CT as clinically indicated.   Electronically Signed   By: Annia Belt M.D.   On: 08/13/2014 20:41   Ct Head Wo Contrast  08/19/2014   CLINICAL DATA:  Dementia.   Dizziness after fall  EXAM: CT HEAD WITHOUT CONTRAST  TECHNIQUE: Contiguous axial images were obtained from the base of the skull through the vertex without intravenous contrast.  COMPARISON:  Aug 13, 2014  FINDINGS: Mild diffuse atrophy is stable. There is no intracranial mass, hemorrhage, extra-axial fluid collection, or midline shift. There is patchy small vessel disease throughout the centra semiovale bilaterally, stable. No new gray-white compartment lesion. No acute infarct.  Bony calvarium appears intact. The mastoid air cells are clear. There is advanced osteoarthritic change with bony remodeling  in the right mandibular condyle/right temporomandibular joint region. There is opacification of a posterior left ethmoid air cell, stable.  IMPRESSION: Stable atrophy with supratentorial small vessel disease. No intracranial mass, hemorrhage, or extra-axial fluid collection. No acute infarct. Advanced arthropathy in the right temporomandibular joint. Stable opacification of a left posterior ethmoid air cell.   Electronically Signed   By: Bretta Bang III M.D.   On: 08/19/2014 16:28   Ct Head Wo Contrast  08/13/2014   CLINICAL DATA:  Dizziness and fall, head injury. Now with headache. Multiple recent falls.  EXAM: CT HEAD WITHOUT CONTRAST  CT CERVICAL SPINE WITHOUT CONTRAST  TECHNIQUE: Multidetector CT imaging of the head and cervical spine was performed following the standard protocol without intravenous contrast. Multiplanar CT image reconstructions of the cervical spine were also generated.  COMPARISON:  None.  FINDINGS: CT HEAD FINDINGS  No intracranial hemorrhage, mass effect, or midline shift. No hydrocephalus. The basilar cisterns are patent. No evidence of territorial infarct. No intracranial fluid collection. Mild cerebral and cerebellar atrophy with moderate chronic small vessel ischemic change. Calvarium is intact. Minimal opacification of left ethmoid air cells, paranasal sinuses are otherwise  clear. Mastoid air cells are well aerated.  CT CERVICAL SPINE FINDINGS  Minimal degenerative-type anterolisthesis of C4 on C5 and C6 on C7. The alignment is otherwise maintained. There is near complete disc space loss and bony fusion of C7-T1, may be congenital. The cervical disc spaces are otherwise preserved, there is disc space narrowing at T1-T2. Vertebral body heights are preserved. There is no fracture. The dens is intact. There are no jumped or perched facets. There is multilevel diffuse facet arthropathy throughout the cervical spine. Mild endplate spurring throughout. No prevertebral soft tissue edema.  IMPRESSION: 1. No acute intracranial abnormality. Mild atrophy and moderate chronic small vessel ischemic change. 2. Degenerative change in the cervical spine without acute fracture or subluxation.   Electronically Signed   By: Rubye Oaks M.D.   On: 08/13/2014 22:34   Ct Angio Chest Pe W/cm &/or Wo Cm  08/14/2014   CLINICAL DATA:  Acute onset of dizziness and syncope. Elevated D-dimer. Initial encounter.  EXAM: CT ANGIOGRAPHY CHEST WITH CONTRAST  TECHNIQUE: Multidetector CT imaging of the chest was performed using the standard protocol during bolus administration of intravenous contrast. Multiplanar CT image reconstructions and MIPs were obtained to evaluate the vascular anatomy.  CONTRAST:  OMNIPAQUE IOHEXOL 350 MG/ML SOLN  COMPARISON:  Chest radiograph performed earlier today at 08:15 p.m.  FINDINGS: There is no evidence of pulmonary embolus.  Mild bibasilar atelectasis is noted. The lungs are otherwise clear. There is no evidence of significant focal consolidation, pleural effusion or pneumothorax. No masses are identified; no abnormal focal contrast enhancement is seen.  The thoracic aorta is borderline normal in size. Scattered coronary artery calcifications are seen. The heart is mildly enlarged. Trace pericardial fluid remains within normal limits. The great vessels are grossly  unremarkable in appearance. A 1.4 cm right paratracheal node is noted. No additional mediastinal lymphadenopathy is seen. No axillary lymphadenopathy is seen. The visualized portions of the thyroid gland are unremarkable in appearance.  The visualized portions of the liver and spleen are unremarkable.  No acute osseous abnormalities are seen. There is chronic osseous fusion at C7-T1. There is mild chronic compression deformity involving vertebral body T8, and the patient is status post vertebroplasty at T12.  Review of the MIP images confirms the above findings.  IMPRESSION: 1. No evidence of pulmonary embolus. 2.  Mild bibasilar atelectasis noted; lungs otherwise clear. 3. Mild cardiomegaly. Scattered coronary artery calcifications seen. 4. 1.4 cm right paratracheal node noted. This is nonspecific and could reflect an underlying infectious or granulomatous process.   Electronically Signed   By: Roanna Raider M.D.   On: 08/14/2014 00:25   Ct Cervical Spine Wo Contrast  08/20/2014   CLINICAL DATA:  Multiple recent falls with bilateral neck pain  EXAM: CT CERVICAL SPINE WITHOUT CONTRAST  TECHNIQUE: Multidetector CT imaging of the cervical spine was performed without intravenous contrast. Multiplanar CT image reconstructions were also generated.  COMPARISON:  Aug 13, 2014  FINDINGS: There is no demonstrable fracture. Slight anterolisthesis of C4 on C5 and C6 on C7 remain stable and is felt to be due to spondylosis. There is no new spondylolisthesis. Prevertebral soft tissues and predental space regions are normal. Bony fusion at C7-T1 is stable. Other disc spaces appear intact. There is multilevel facet hypertrophy, stable. No frank disc extrusion or stenosis. There are foci of carotid artery calcification bilaterally.  IMPRESSION: Stable multilevel osteoarthritic change. No frank disc extrusion or stenosis. Slight spondylolisthesis at C4-5 and C6-7 is stable. No new spondylolisthesis. No fracture. Calcification is  noted in both carotid arteries. There is no appreciable change compared to recent prior study.   Electronically Signed   By: Bretta Bang III M.D.   On: 08/20/2014 09:17   Ct Cervical Spine Wo Contrast  08/13/2014   CLINICAL DATA:  Dizziness and fall, head injury. Now with headache. Multiple recent falls.  EXAM: CT HEAD WITHOUT CONTRAST  CT CERVICAL SPINE WITHOUT CONTRAST  TECHNIQUE: Multidetector CT imaging of the head and cervical spine was performed following the standard protocol without intravenous contrast. Multiplanar CT image reconstructions of the cervical spine were also generated.  COMPARISON:  None.  FINDINGS: CT HEAD FINDINGS  No intracranial hemorrhage, mass effect, or midline shift. No hydrocephalus. The basilar cisterns are patent. No evidence of territorial infarct. No intracranial fluid collection. Mild cerebral and cerebellar atrophy with moderate chronic small vessel ischemic change. Calvarium is intact. Minimal opacification of left ethmoid air cells, paranasal sinuses are otherwise clear. Mastoid air cells are well aerated.  CT CERVICAL SPINE FINDINGS  Minimal degenerative-type anterolisthesis of C4 on C5 and C6 on C7. The alignment is otherwise maintained. There is near complete disc space loss and bony fusion of C7-T1, may be congenital. The cervical disc spaces are otherwise preserved, there is disc space narrowing at T1-T2. Vertebral body heights are preserved. There is no fracture. The dens is intact. There are no jumped or perched facets. There is multilevel diffuse facet arthropathy throughout the cervical spine. Mild endplate spurring throughout. No prevertebral soft tissue edema.  IMPRESSION: 1. No acute intracranial abnormality. Mild atrophy and moderate chronic small vessel ischemic change. 2. Degenerative change in the cervical spine without acute fracture or subluxation.   Electronically Signed   By: Rubye Oaks M.D.   On: 08/13/2014 22:34   Mr Brain Wo  Contrast  08/20/2014   CLINICAL DATA:  Multiple falls.  Neck pain and headaches.  Vertigo.  EXAM: MRI HEAD WITHOUT CONTRAST  TECHNIQUE: Multiplanar, multiecho pulse sequences of the brain and surrounding structures were obtained without intravenous contrast.  COMPARISON:  CT head without contrast 08/19/2014.  FINDINGS: A heterogeneous right extra-axial/subdural hematoma is new since yesterday's study. Collection measures 11 mm in maximal thickness on coronal images. There is some local mass effect with partial effacement of the sulci. Midline shift measures 4 mm maximally.  No acute parenchymal infarct is present. The ventricles are of normal size.  Moderate periventricular and subcortical white matter changes likely reflect the sequela of chronic microvascular ischemia. Mild to moderate generalized atrophy is present as well.  Flow is present in the major intracranial arteries. Bilateral lens replacements are noted. The globes and orbits are otherwise intact. The paranasal sinuses and mastoid air cells are clear. The skullbase is within normal limits. Midline structures are otherwise within normal limits.  IMPRESSION: 1. New right subdural hematoma measuring up to 11 mm in maximal thickness on coronal images. Midline shift measures up to 4 mm right to left. There is effacement of the underlying sulci. 2. Moderate generalized atrophy and diffuse white matter disease likely reflects chronic microvascular ischemic change. The atrophy great some accommodation for mass effect from the hemorrhage. 3. No acute parenchymal infarct. Critical Value/emergent results were called by telephone at the time of interpretation on 08/20/2014 at 10:14 am to Dr. Doug SouSAM Meyer Arora , who verbally acknowledged these results.   Electronically Signed   By: Marin Robertshristopher  Mattern M.D.   On: 08/20/2014 10:14     MDM   MRI brain ordered today to rule out posterior circulation stroke . I spoke Colletta MarylandwithTasha Giles , triage RN for Dr Redmond Schoolripp who will  pass the message on that patient needs CT scan of brain 1 week from now. Laceration of left forearm does not require repair. Local wound care with topical anabiotic' Will increase meclizine to 25 mg twice a day when necessary  Diagnoses #1 subdural hematoma #2 fall #3 dizziness #4 laceration left forearm #5 abrasion to right wrist  Final diagnoses:  None        Doug SouSam Leiliana Foody, MD 08/20/14 1134

## 2014-08-20 NOTE — Progress Notes (Addendum)
CSW consulted by RN CM. [facility], who shared that patient forgets to use her walker. Pt is experiencing these falls when she forgets to use her walker, especially walking to the bathroom, leaving the walker in bathroom and walking back to her room. [facility], patient is not a memory care unit, and is struggling mostly with remembering to use her walker. Pt is also being reminded to use her walker. Pt is receiving physical therapy 3x per week. [facility], patient can have increased checks due to increased falls. CSW will discuss with pt son: Paula Bird. Pt room is close to administrators and extra checks will be easy to increase. Per pt son, Paula Bird, plans to [facility] to discuss pt utilizing wheelchair more. Pt to return to ALF.   Paula CoasterKristen Raylei Losurdo, LCSW  Clinical Social Work  [facility] [phone]

## 2014-08-20 NOTE — ED Notes (Signed)
Pt has large skin tear on left forearm extending to lateral posterior side and a small skin tear on right wrist. Both dressed with xeroform, telfa pads, and a dry gauze wrapping. Left skin tear actively bleeding. Pressure applied with gauze dressing.

## 2014-08-20 NOTE — Discharge Instructions (Signed)
Increase meclizine to 25 mg twice daily as needed for dizziness. Paula Bird is prone to falls and should be kept in wheelchair at all times when not in bed. Fall precautions need to be observed. Wash wounds on left forearm and right wrist  daily with soap and water, and then place a thin layer of anabiotic ointment over the wounds and cover with a bandage. Dr. Pilar Grammesripp's office has been contacted to order her an outpatient CT scan of the brain 1 week from today. If her condition worsens for any reason [facility]. She was given a tetanus shot (Tdap) today

## 2014-08-20 NOTE — ED Notes (Signed)
Wynne DustGuy Venables (son) updated on patient condition and knows to make sure MRI/CT is repeated in one week. Also knows to make sure she is brought back to ER if any neurological changes.

## 2014-08-20 NOTE — ED Notes (Signed)
Patient transported to MRI 

## 2014-08-27 ENCOUNTER — Emergency Department (HOSPITAL_COMMUNITY)
Admission: EM | Admit: 2014-08-27 | Discharge: 2014-08-27 | Disposition: A | Discharge status: Home / Self Care | Payer: Medicare Other | Attending: Emergency Medicine | Admitting: Emergency Medicine

## 2014-08-27 ENCOUNTER — Ambulatory Visit (HOSPITAL_COMMUNITY)
Admission: RE | Admit: 2014-08-27 | Discharge: 2014-08-27 | Disposition: A | Discharge status: Home / Self Care | Payer: Medicare Other | Source: Ambulatory Visit | Attending: Emergency Medicine | Admitting: Emergency Medicine

## 2014-08-27 ENCOUNTER — Encounter (HOSPITAL_COMMUNITY): Payer: Self-pay | Admitting: Emergency Medicine

## 2014-08-27 DIAGNOSIS — Z7951 Long term (current) use of inhaled steroids: Secondary | ICD-10-CM | POA: Diagnosis not present

## 2014-08-27 DIAGNOSIS — E079 Disorder of thyroid, unspecified: Secondary | ICD-10-CM | POA: Insufficient documentation

## 2014-08-27 DIAGNOSIS — Z792 Long term (current) use of antibiotics: Secondary | ICD-10-CM | POA: Diagnosis not present

## 2014-08-27 DIAGNOSIS — Z7982 Long term (current) use of aspirin: Secondary | ICD-10-CM | POA: Insufficient documentation

## 2014-08-27 DIAGNOSIS — I10 Essential (primary) hypertension: Secondary | ICD-10-CM | POA: Diagnosis not present

## 2014-08-27 DIAGNOSIS — I6200 Nontraumatic subdural hemorrhage, unspecified: Secondary | ICD-10-CM | POA: Diagnosis present

## 2014-08-27 DIAGNOSIS — S065XAA Traumatic subdural hemorrhage with loss of consciousness status unknown, initial encounter: Secondary | ICD-10-CM

## 2014-08-27 DIAGNOSIS — K5900 Constipation, unspecified: Secondary | ICD-10-CM | POA: Insufficient documentation

## 2014-08-27 DIAGNOSIS — W1839XA Other fall on same level, initial encounter: Secondary | ICD-10-CM | POA: Diagnosis not present

## 2014-08-27 DIAGNOSIS — Y9289 Other specified places as the place of occurrence of the external cause: Secondary | ICD-10-CM | POA: Insufficient documentation

## 2014-08-27 DIAGNOSIS — Y998 Other external cause status: Secondary | ICD-10-CM | POA: Diagnosis not present

## 2014-08-27 DIAGNOSIS — Y9389 Activity, other specified: Secondary | ICD-10-CM | POA: Insufficient documentation

## 2014-08-27 DIAGNOSIS — Z79899 Other long term (current) drug therapy: Secondary | ICD-10-CM | POA: Insufficient documentation

## 2014-08-27 DIAGNOSIS — S065X9A Traumatic subdural hemorrhage with loss of consciousness of unspecified duration, initial encounter: Secondary | ICD-10-CM

## 2014-08-27 DIAGNOSIS — R296 Repeated falls: Secondary | ICD-10-CM

## 2014-08-27 DIAGNOSIS — S065X0A Traumatic subdural hemorrhage without loss of consciousness, initial encounter: Secondary | ICD-10-CM | POA: Diagnosis present

## 2014-08-27 MED ORDER — DICYCLOMINE HCL 20 MG PO TABS: 20.0000 mg | ORAL_TABLET | Freq: Two times a day (BID) | ORAL | Status: DC

## 2014-08-27 MED ORDER — FLEET ENEMA 7-19 GM/118ML RE ENEM
1.0000 | ENEMA | Freq: Once | RECTAL | Status: AC
  Administered 2014-08-27: 1 via RECTAL
  Filled 2014-08-27: qty 1

## 2014-08-27 MED ORDER — POLYETHYLENE GLYCOL 3350 17 G PO PACK: 17.0000 g | PACK | Freq: Every day | ORAL | Status: DC

## 2014-08-27 MED ORDER — ACETAMINOPHEN-CODEINE #3 300-30 MG PO TABS: 1.0000 | ORAL_TABLET | Freq: Four times a day (QID) | ORAL | Status: DC | PRN

## 2014-08-27 MED ORDER — DICYCLOMINE HCL 20 MG PO TABS
20.0000 mg | ORAL_TABLET | Freq: Once | ORAL | Status: AC
  Administered 2014-08-27: 20 mg via ORAL
  Filled 2014-08-27: qty 1

## 2014-08-27 NOTE — ED Provider Notes (Signed)
CSN: [id number]     Arrival date & time 08/27/14  1254 History   First MD Initiated Contact with Patient 08/27/14 1354     Chief Complaint  Patient presents with  .  subdural hematoma   . sent from [facility]      (Consider location/radiation/quality/duration/timing/severity/associated sxs/prior Treatment) HPI  This is an 81 year old female who presents the emergency department under the advice of radiology. Worsening subdural hematoma. The patient was seen one week ago by Dr. Remi Haggard at the [facility] emergency department. She had been seen for frequent falls. At that time. She is found to have a subdural hematoma. Dr. Rennis Chris consult with Dr. Mikal Plane who advised repeat CT scan in 1 week. Patient is noted to have had one fall during the week on Tuesday, 4 days ago. Her son who attends her states that she has had no worsening mental status changes. She does have a continued headache. She is currently taking Tylenol without significant relief. The patient is also complaining of vertigo which is poorly relieved with her meclizine. Son states that she is currently at her baseline mental status. The patient is also complaining of pain on her bruising sites as well as constipation. She complains of cramping abdominal pain. She denies any other symptoms at this time  Past Medical History  Diagnosis Date  . Hypertension   . Thyroid disease   . Vertigo    Past Surgical History  Procedure Laterality Date  . Abdominal hysterectomy    . Appendectomy     No family history on file. History  Substance Use Topics  . Smoking status: Never Smoker   . Smokeless tobacco: Not on file  . Alcohol Use: No   OB History    No data available     Review of Systems   Ten systems reviewed and are negative for acute change, except as noted in the HPI.   Allergies  Review of patient's allergies indicates no known allergies.  Home Medications   Prior to Admission medications   Medication Sig Start  Date End Date Taking? Authorizing Provider  acetaminophen (TYLENOL) 325 MG tablet Take 325 mg by mouth every 4 (four) hours as needed for mild pain, moderate pain, fever or headache.    Historical Provider, MD  acidophilus (RISAQUAD) CAPS capsule Take 1 capsule by mouth 3 (three) times daily.    Historical Provider, MD  amiodarone (PACERONE) 200 MG tablet Take 200 mg by mouth 2 (two) times daily.    Historical Provider, MD  amitriptyline (ELAVIL) 25 MG tablet Take 25 mg by mouth at bedtime.    Historical Provider, MD  aspirin 325 MG tablet Take 325 mg by mouth daily.    Historical Provider, MD  cholecalciferol (VITAMIN D) 1000 UNITS tablet Take 2,000 Units by mouth daily.    Historical Provider, MD  doxycycline (VIBRA-TABS) 100 MG tablet Take 100 mg by mouth 2 (two) times daily.    Historical Provider, MD  esomeprazole (NEXIUM) 40 MG capsule Take 40 mg by mouth daily at 12 noon.    Historical Provider, MD  fluticasone (FLONASE) 50 MCG/ACT nasal spray Place 1 spray into both nostrils 2 (two) times daily.    Historical Provider, MD  levothyroxine (SYNTHROID, LEVOTHROID) 75 MCG tablet Take 75 mcg by mouth daily before breakfast.    Historical Provider, MD  meclizine (ANTIVERT) 12.5 MG tablet Take 12.5 mg by mouth 2 (two) times daily.    Historical Provider, MD  metoprolol (LOPRESSOR) 50 MG tablet  Take 50 mg by mouth daily.    Historical Provider, MD  Multiple Vitamins-Minerals (MULTIVITAMIN GUMMIES ADULT PO) Take 1 tablet by mouth daily.    Historical Provider, MD  ondansetron (ZOFRAN) 4 MG tablet Take 4 mg by mouth every 6 (six) hours as needed for nausea or vomiting.    Historical Provider, MD  PARoxetine (PAXIL) 40 MG tablet Take 40 mg by mouth at bedtime.    Historical Provider, MD  polyethylene glycol (MIRALAX / GLYCOLAX) packet Take 17 g by mouth every Monday, Wednesday, and Friday.    Historical Provider, MD  senna (SENOKOT) 8.6 MG tablet Take 1 tablet by mouth 2 (two) times daily.    Historical  Provider, MD  sulfamethoxazole-trimethoprim (BACTRIM DS,SEPTRA DS) 800-160 MG per tablet Take 1 tablet by mouth 2 (two) times daily.    Historical Provider, MD  traZODone (DESYREL) 100 MG tablet Take 100 mg by mouth at bedtime.    Historical Provider, MD  vitamin C (ASCORBIC ACID) 500 MG tablet Take 500 mg by mouth daily.    Historical Provider, MD   BP 132/98 mmHg  Pulse 63  Resp 17  Ht 5' (1.524 m)  Wt 130 lb (58.968 kg)  BMI 25.39 kg/m2  SpO2 95% Physical Exam  Constitutional: She is oriented to person, place, and time. She appears well-developed and well-nourished. No distress.  HENT:  Head: Normocephalic and atraumatic.  Eyes: Conjunctivae and EOM are normal. Pupils are equal, round, and reactive to light. No scleral icterus.  Neck: Normal range of motion.  Cardiovascular: Normal rate, regular rhythm and normal heart sounds.  Exam reveals no gallop and no friction rub.   No murmur heard. Pulmonary/Chest: Effort normal and breath sounds normal. No respiratory distress.  Abdominal: Soft. Bowel sounds are normal. She exhibits distension. She exhibits no mass. There is tenderness. There is no guarding.  Diffuse abdominal tenderness and distention. No signs of trauma to the abdomen  Neurological: She is alert and oriented to person, place, and time.  GCS of 15 Alert and oriented to person, place and time Speech is clear and goal oriented, follows commands Major Cranial nerves without deficit, no facial droop Normal strength in upper and lower extremities bilaterally including dorsiflexion and plantar flexion, strong and equal grip strength Sensation normal to light and sharp touch Moves extremities without ataxia, coordination intact Normal finger to nose and rapid alternating movements Neg romberg, no pronator drift Gait deferred   Skin: Skin is warm and dry. She is not diaphoretic.  Multiple bruises in various stages of healing, some skin tears. No signs of infection.  Nursing  note and vitals reviewed.   ED Course  Procedures (including critical care time) Labs Review Labs Reviewed - No data to display  Imaging Review Ct Head Wo Contrast  08/27/2014   CLINICAL DATA:  [age] year old female with a history of fall. Subdural hematoma on right, identified on MRI.  EXAM: CT HEAD WITHOUT CONTRAST  TECHNIQUE: Contiguous axial images were obtained from the base of the skull through the vertex without intravenous contrast.  COMPARISON:  Multiple prior head CT, most recent 08/20/2014. Recent brain MRI 08/20/2014  FINDINGS: Unremarkable appearance of the calvarium without acute fracture or aggressive lesion.  Unremarkable appearance of the scalp soft tissues.  Unremarkable appearance of the bilateral orbits.  Bilateral lens extraction  Mastoid air cells are clear.  Trace mucosal disease of the left-sided ethmoid air cells.  Interval development hyperdense lentiform focus overlying the high frontal lobe, with greatest thickness  measuring 14 mm, an greatest diameter measuring 4.1 cm along the a inner table of the calvarium. There is focal compression of the right frontal lobe at this area.  In addition, there is enlarging mixed density right-sided subdural hemorrhage, the hyperdense component is layered posteriorly measuring as great as 11 mm.  Enlarging right-sided hemorrhage, extending from nearly the vertex to the sylvian fissure, results in worsening of right to left midline shift. Greatest shift on prior MRI measures 4 mm. There is 6 mm of right-to-left shift measured on the current at the level of the foramen of Monro. Compression of the right lateral ventricle, increased from comparison study.  New hyperdense hemorrhage layered along the posterior falx. No subarachnoid hemorrhage identified.  Gray-white attenuation/differentiation is maintained.  Calcifications of the intracranial vasculature.  IMPRESSION: Since the prior CT and MRI, there has been new, acute intracranial hemorrhage,  including a lentiform focus of new hemorrhage overlying the high right frontal lobe, resulting in focal compression of the frontal lobe. There is also a small focus of acute subdural layered along the posterior falx.  Interval enlargement of mixed density right-sided subdural hemorrhage, which extends from the vertex to the Sylvian fissure, and now results in increased right to left shift at the septum pellucidum, previously 4 mm, now 6 mm. There is associated compression of the right lateral ventricle. Greatest thickness of the subdural now measures approximately 11 mm adjacent to the right temporoparietal region.  No skull fracture.  Intracranial atherosclerosis.  These results were called by telephone at the time of interpretation on 08/27/2014 at 10:47 am to the nurse of Dr. Coletta Memos, Ms Charlsie Quest, who verbally acknowledged these results.  Signed,  Yvone Neu. Loreta Ave, DO  [facility]  [facility]   Electronically Signed   By: Gilmer Mor D.O.   On: 08/27/2014 10:50     EKG Interpretation None      MDM   Final diagnoses:  Subdural hematoma  Frequent falls  Constipation, unspecified constipation type     I spoke with Dr. Conchita Paris about the patient. He states that he is arteries spoken with Dr. Mikal Plane about the case. He states, considering her age and her DO NOT RESUSCITATE status . He is not a good candidate for surgical intervention. The patient is also alert and oriented with headache and vertigo being her only signs, although there did appear to be some new bleeds. I discussed this with her son who is in agreement with the plan as he does not wish for surgical intervention to "ever occur." He is the patient's legal healthcare power of attorney. The patient's continued complaint of constipation and asks for an enema. I'm giving the patient an enema. She is currently trying to void her bowels. She is complaining of cramping and I have given her  bentyl. Discharge patient with some changes in her medication. Dr. Conchita Paris suggests Tylenol 3 for better pain control. She is asked to follow up with Dr. Mikal Plane in his office. I have given sign out to [facility] who will discharge the patient    Arthor Captain, PA-C 08/27/14 1708  Toy Cookey, MD 08/28/14 [phone]

## 2014-08-27 NOTE — ED Notes (Signed)
[facility], has been falling in past week at nursing home [facility] [facility]) was sent here for subdural hematoma-- Dr. Shela CommonsJ. Expecting pt.

## 2014-08-27 NOTE — Discharge Instructions (Signed)
Subdural Hematoma A subdural hematoma is a collection of blood between the brain and its tough outermost membrane covering (the dura). Blood clots that form in this area push down on the brain and cause irritation. A subdural hematoma may cause parts of the brain to stop working and eventually cause death.  CAUSES A subdural hematoma is caused by bleeding from a ruptured blood vessel (hemorrhage). The bleeding results from trauma to the head, such as from a fall or motor vehicle accident. There are two types of subdural hemorrhages:  Acute. This type develops shortly after a serious blow to the head and causes blood to collect very quickly. If not diagnosed and treated promptly, severe brain injury or death can occur.  Chronic. This is when bleeding develops more slowly, over weeks or months. RISK FACTORS People at risk for subdural hematoma include older persons, infants, and alcoholics. SYMPTOMS An acute subdural hemorrhage develops over minutes to hours. Symptoms can include:  Temporary loss of consciousness.  Weakness of arms or legs on one side of the body.  Changes in vision or speech.  A severe headache.  Seizures.  Nausea and vomiting.  Increased sleepiness. A chronic subdural hemorrhage develops over weeks to months. Symptoms may develop slowly and produce less noticeable problems or changes. Symptoms include:  A mild headache.  A change in personality.  Loss of balance or difficulty walking.  Weakness, numbness, or tingling in the arms or legs.  Nausea or vomiting.  Memory loss.  Double vision.  Increased sleepiness. DIAGNOSIS Your health care provider will perform a thorough physical and neurological exam. A CT scan or MRI may also be done. If there is blood on the scan, its color will help your health care provider determine how long the hemorrhage has been there. TREATMENT If the cause is an acute subdural hemorrhage, immediate treatment is needed. In many  cases an emergency surgery is performed to drain accumulated blood or to remove the blood clot. Sometimes steroid or diuretic medicines or controlled breathing through a ventilator is needed to decrease pressure in the brain. This is especially true if there is any swelling of the brain. If the cause is a chronic subdural hemorrhage, treatment depends on a variety of factors. Sometimes no treatment is needed. If the subdural hematoma is small and causes minimal or no symptoms, you may be treated with bed rest, medicines, and observation. If the hemorrhage is large or if you have neurological symptoms, an emergency surgery is usually needed to remove the blood clot. People who develop a subdural hemorrhage are at risk of developing seizures, even after the subdural hematoma has been treated. You may be prescribed an anti-seizure (anticonvulsant) medicine for a year or longer. HOME CARE INSTRUCTIONS  Only take medicines as directed by your health care provider.  Rest if directed by your health care provider.  Keep all follow-up appointments with your health care provider.  If you play a contact sport such as football, hockey or soccer and you experienced a significant head injury, allow enough time for healing (up to 15 days) before you start playing again. A repeated injury that occurs during this fragile repair period is likely to result in hemorrhage. This is called the second impact syndrome. SEEK IMMEDIATE MEDICAL CARE IF:  You fall or experience minor trauma to your head and you are taking blood thinners. If you are on any blood thinners even a very small injury can cause a subdural hematoma. You should not hesitate to seek  medical attention regardless of how minor you think your symptoms are.  You experience a head injury and have:  Drowsiness or a decrease in alertness.  Confusion or forgetfulness.  Slurred speech.  Irrational or aggressive behavior.  Numbness or paralysis in any part  of the body.  A feeling of being sick to your stomach (nauseous) or you throw up (vomit).  Difficulty walking or poor coordination.  Double vision.  Seizures.  A bleeding disorder.  A history of heavy alcohol use.  Clear fluid draining from your nose or ears.  Personality changes.  Difficulty thinking.  Worsening symptoms. MAKE SURE YOU:  Understand these instructions.  Will watch your condition.  Will get help right away if you are not doing well or get worse. FOR MORE INFORMATION National Institute of Neurological Disorders and Stroke: [place]edoAutomobile.co.ukwww.ninds.nih.gov American Association of Neurological Surgeons: www.neurosurgerytoday.org American Academy of Neurology (AAN): ComparePet.czwww.aan.com Brain Injury Association of America: www.biausa.org Document Released: 01/28/2004 Document Revised: 12/31/2012 Document Reviewed: 09/12/2012 [facility] Patient Information 2015 [place], [place]. This information is not intended to replace advice given to you by your health care provider. Make sure you discuss any questions you have with your health care provider.  Constipation Constipation is when a person has fewer than three bowel movements a week, has difficulty having a bowel movement, or has stools that are dry, hard, or larger than normal. As people grow older, constipation is more common. If you try to fix constipation with medicines that make you have a bowel movement (laxatives), the problem may get worse. Long-term laxative use may cause the muscles of the colon to become weak. A low-fiber diet, not taking in enough fluids, and taking certain medicines may make constipation worse.  CAUSES   Certain medicines, such as antidepressants, pain medicine, iron supplements, antacids, and water pills.   Certain diseases, such as diabetes, irritable bowel syndrome (IBS), thyroid disease, or depression.   Not drinking enough water.   Not eating enough fiber-rich foods.   Stress or travel.   Lack  of physical activity or exercise.   Ignoring the urge to have a bowel movement.   Using laxatives too much.  SIGNS AND SYMPTOMS   Having fewer than three bowel movements a week.   Straining to have a bowel movement.   Having stools that are hard, dry, or larger than normal.   Feeling full or bloated.   Pain in the lower abdomen.   Not feeling relief after having a bowel movement.  DIAGNOSIS  Your health care provider will take a medical history and perform a physical exam. Further testing may be done for severe constipation. Some tests may include:  A barium enema X-ray to examine your rectum, colon, and, sometimes, your small intestine.   A sigmoidoscopy to examine your lower colon.   A colonoscopy to examine your entire colon. TREATMENT  Treatment will depend on the severity of your constipation and what is causing it. Some dietary treatments include drinking more fluids and eating more fiber-rich foods. Lifestyle treatments may include regular exercise. If these diet and lifestyle recommendations do not help, your health care provider may recommend taking over-the-counter laxative medicines to help you have bowel movements. Prescription medicines may be prescribed if over-the-counter medicines do not work.  HOME CARE INSTRUCTIONS   Eat foods that have a lot of fiber, such as fruits, vegetables, whole grains, and beans.  Limit foods high in fat and processed sugars, such as french fries, hamburgers, cookies, candies, and soda.  A fiber supplement may be added to your diet if you cannot get enough fiber from foods.   Drink enough fluids to keep your urine clear or pale yellow.   Exercise regularly or as directed by your health care provider.   Go to the restroom when you have the urge to go. Do not hold it.   Only take over-the-counter or prescription medicines as directed by your health care provider. Do not take other medicines for constipation without  talking to your health care provider first.  SEEK IMMEDIATE MEDICAL CARE IF:   You have bright red blood in your stool.   Your constipation lasts for more than 4 days or gets worse.   You have abdominal or rectal pain.   You have thin, pencil-like stools.   You have unexplained weight loss. MAKE SURE YOU:   Understand these instructions.  Will watch your condition.  Will get help right away if you are not doing well or get worse. Document Released: 12/09/2003 Document Revised: 03/17/2013 Document Reviewed: 12/22/2012 [facility] Patient Information 2015 [place], [place]. This information is not intended to replace advice given to you by your health care provider. Make sure you discuss any questions you have with your health care provider.

## 2014-09-07 ENCOUNTER — Other Ambulatory Visit (HOSPITAL_COMMUNITY): Payer: Self-pay | Admitting: Geriatric Medicine

## 2014-09-07 DIAGNOSIS — R1314 Dysphagia, pharyngoesophageal phase: Secondary | ICD-10-CM

## 2014-09-21 ENCOUNTER — Ambulatory Visit (HOSPITAL_COMMUNITY)
Admission: RE | Admit: 2014-09-21 | Discharge: 2014-09-21 | Disposition: A | Discharge status: Home / Self Care | Payer: Medicare Other | Source: Ambulatory Visit | Attending: Geriatric Medicine | Admitting: Geriatric Medicine

## 2014-09-21 DIAGNOSIS — R1310 Dysphagia, unspecified: Secondary | ICD-10-CM | POA: Diagnosis present

## 2014-09-21 DIAGNOSIS — R1314 Dysphagia, pharyngoesophageal phase: Secondary | ICD-10-CM

## 2014-09-21 NOTE — Procedures (Signed)
  Objective Swallowing Evaluation: Other (Comment)  Patient Details  Name: Paula Bird MRN: [id number] Date of Birth: 07/05/1925  Today's Date: 09/21/2014 Time: SLP Start Time (ACUTE ONLY): 1255-SLP Stop Time (ACUTE ONLY): 1335 SLP Time Calculation (min) (ACUTE ONLY): 40 min  Past Medical History:  Past Medical History  Diagnosis Date  . Hypertension   . Thyroid disease   . Vertigo    Past Surgical History:  Past Surgical History  Procedure Laterality Date  . Abdominal hysterectomy    . Appendectomy     HPI:  Other Pertinent Information: 81 yo female resident of [place], referred for MBS due to pt having dysphagia.  PMH + for recent fall with SDH, HTN, thyroid dx, osteoporosis, reflux, constipation, pna and vertigo.  Pt reports recently losing her dentures *2 months ago* with worsening dysphagia.  Pt symptoms include "food coming back up" - occuring 2-3 times a week.    No Data Recorded  Assessment / Plan / Recommendation CHL IP CLINICAL IMPRESSIONS 09/21/2014  Therapy Diagnosis WFL  Clinical Impression Pt with functional oropharyngeal swallow without aspiration or penetration of any consistency tested.  Swallow was timely without significant residuals.  Pt taxed by consumption of sequential boluses of thin with excellent airway protection.  Barium tablet given with thin transited easily through oropharynx into esophagus.  Tablet appeared to lodge at distal esophagus with referrant senation to pharynx - nectar thick liquid transited tablet into stomach.  Trace pharyngeal residuals of nectar noted at completion of MBS.   Based on findings of MBS and pt report of food "coming back up", suspect esophageal dysphagia component.  Advised pt to consume liquids with her meals and take frequent rest breaks if sense residuals that do not clear.  Small meals may be helpful to maximize comfort with intake.     Using live video, verbal and visual cues, educated pt and Bird [name] to findings and  recommendations.       CHL IP TREATMENT RECOMMENDATION 09/21/2014  Treatment Recommendations No treatment recommended at this time     CHL IP DIET RECOMMENDATION 09/21/2014  SLP Diet Recommendations Dysphagia 3 (Mech soft);Thin  Liquid Administration via Cup, straw  Medication Administration Whole meds with liquids  Compensations Small sips/bites;Slow rate;Follow solids with liquid  Postural Changes and/or Swallow Maneuvers Reflux precautions     CHL IP OTHER RECOMMENDATIONS 09/21/2014  Recommended Consults Consider esophageal assessment  Oral Care Recommendations Oral care BID  Other Recommendations (None)         CHL IP REASON FOR REFERRAL 09/21/2014  Reason for Referral Objectively evaluate swallowing function     CHL IP ORAL PHASE 09/21/2014  Oral Phase Impaired      CHL IP PHARYNGEAL PHASE 09/21/2014  Pharyngeal Phase Impaired      CHL IP CERVICAL ESOPHAGEAL PHASE 09/21/2014  Cervical Esophageal Phase Impaired  Cervical Esophageal Comment barium tablet given with thin appeared to lodge at distal esophagus with referrant sensation to pharynx, nectar bolus faciliated clearance, pt reported sensation of residuals pointing to mid-sternum upon completion of MBS     CHL IP GO 09/21/2014  Functional Assessment Tool Used mbs, clinical judgement  Functional Limitations Swallowing  Swallow Current Status (W0981(G8996) CI  Swallow Goal Status (X9147(G8997) CI  Swallow Discharge Status (W2956(G8998) CI          Donavan Burnetamara Raman Feather[place], MS [facility] SLP [phone]

## 2015-02-08 ENCOUNTER — Encounter: Payer: Self-pay | Admitting: Hematology

## 2015-02-08 ENCOUNTER — Ambulatory Visit (HOSPITAL_BASED_OUTPATIENT_CLINIC_OR_DEPARTMENT_OTHER): Payer: Medicare Other | Admitting: Hematology

## 2015-02-08 ENCOUNTER — Ambulatory Visit (HOSPITAL_BASED_OUTPATIENT_CLINIC_OR_DEPARTMENT_OTHER): Payer: Medicare Other

## 2015-02-08 VITALS — BP 182/61 | HR 58 | Temp 97.7°F | Resp 20 | Ht 60.0 in | Wt 127.9 lb

## 2015-02-08 DIAGNOSIS — D649 Anemia, unspecified: Secondary | ICD-10-CM

## 2015-02-08 DIAGNOSIS — D696 Thrombocytopenia, unspecified: Secondary | ICD-10-CM

## 2015-02-08 LAB — CBC & DIFF AND RETIC
BASO%: 0.4 % (ref 0.0–2.0)
Basophils Absolute: 0 10*3/uL (ref 0.0–0.1)
EOS%: 1.1 % (ref 0.0–7.0)
Eosinophils Absolute: 0.1 10*3/uL (ref 0.0–0.5)
HCT: 40.4 % (ref 34.8–46.6)
HGB: 13 g/dL (ref 11.6–15.9)
IMMATURE RETIC FRACT: 4.7 % (ref 1.60–10.00)
LYMPH%: 20.3 % (ref 14.0–49.7)
MCH: 28.2 pg (ref 25.1–34.0)
MCHC: 32.2 g/dL (ref 31.5–36.0)
MCV: 87.6 fL (ref 79.5–101.0)
MONO#: 0.4 10*3/uL (ref 0.1–0.9)
MONO%: 9.4 % (ref 0.0–14.0)
NEUT%: 68.8 % (ref 38.4–76.8)
NEUTROS ABS: 3.1 10*3/uL (ref 1.5–6.5)
RBC: 4.61 10*6/uL (ref 3.70–5.45)
RDW: 14.9 % — ABNORMAL HIGH (ref 11.2–14.5)
Retic %: 0.88 % (ref 0.70–2.10)
Retic Ct Abs: 40.57 10*3/uL (ref 33.70–90.70)
WBC: 4.5 10*3/uL (ref 3.9–10.3)
lymph#: 0.9 10*3/uL (ref 0.9–3.3)
nRBC: 0 % (ref 0–0)

## 2015-02-08 LAB — DRAW EXTRA CLOT TUBE

## 2015-02-08 NOTE — Progress Notes (Signed)
[facility]  [phone] [phone]  Clinic New Consult Note   Patient Care Team: [name], MD as PCP - General (Family Medicine) 02/08/2015  Referral requested by [facility]   CHIEF COMPLAINTS/PURPOSE OF CONSULTATION:  Abnormal lab, suspect thrombocytopenia  HISTORY OF PRESENTING ILLNESS:  Paula Bird 81 y.o. female is here because of abnormal lab which suspect thrombocytopenia. She is accompanied to the clinic by her daughter.  She had multiple fall at home, developed subcutaneous hematoma in June 2016, was seen at the ED, and CBC was normal at that time. Due to the frequent fall, she was moved to a skilled nursing home. According to her daughter, she is not very happy about her care at another facility. She states there is a doctor who comes to the facility, but she has not been able to see him despite her multiple requests. Due to her balance issues and multiple fall, she uses a wheelchair most of time to move around. She does have a walker, but does not use much.   She had lab tests done at the facility on 12/27/2014, which showed decreased platelet, less than 10 platelets/OIF, the platelet count was not given on the report. Her WBC was normal, hemoglobin 11.8. She was referred for further evaluation.  She has some skin bruising from the previous fall, but denies any spontaneous bruising or bleeding.  She denied any bleeding episodes including hematochezia, melana, hemoptysis, hematuria or epitaxis. No mucosal bleeding or easy bruising.    MEDICAL HISTORY:  Past Medical History  Diagnosis Date  . Hypertension   . Thyroid disease   . Vertigo     SURGICAL HISTORY: Past Surgical History  Procedure Laterality Date  . Abdominal hysterectomy    . Appendectomy      SOCIAL HISTORY: Social History   Social History  . Marital Status: Widowed    Spouse Name: N/A  . Number of Children: N/A  . Years of Education:  N/A   Occupational History  . Not on file.   Social History Main Topics  . Smoking status: Never Smoker   . Smokeless tobacco: Not on file  . Alcohol Use: No  . Drug Use: No  . Sexual Activity: Not on file   Other Topics Concern  . Not on file   Social History Narrative    FAMILY HISTORY: History reviewed. No pertinent family history.  ALLERGIES:  has No Known Allergies.  MEDICATIONS:  Current Outpatient Prescriptions  Medication Sig Dispense Refill  . acetaminophen (TYLENOL) 325 MG tablet Take 325 mg by mouth every 4 (four) hours as needed for mild pain, moderate pain, fever or headache.    Marland Kitchen acetaminophen-codeine (TYLENOL #3) 300-30 MG per tablet Take 1 tablet by mouth every 6 (six) hours as needed for moderate pain or severe pain. 15 tablet 0  . amiodarone (PACERONE) 200 MG tablet Take 200 mg by mouth 2 (two) times daily.    Marland Kitchen aspirin 325 MG tablet Take 325 mg by mouth daily.    . cholecalciferol (VITAMIN D) 1000 UNITS tablet Take 2,000 Units by mouth daily.    Marland Kitchen dicyclomine (BENTYL) 20 MG tablet Take 1 tablet (20 mg total) by mouth 2 (two) times daily. 20 tablet 0  . esomeprazole (NEXIUM) 40 MG capsule Take 40 mg by mouth daily at 12 noon.    . fluticasone (FLONASE) 50 MCG/ACT nasal spray Place 1 spray into both nostrils 2 (two) times daily.    . hydrochlorothiazide (HYDRODIURIL)  25 MG tablet Take 25 mg by mouth daily.  10  . levothyroxine (SYNTHROID, LEVOTHROID) 75 MCG tablet Take 75 mcg by mouth daily before breakfast.    . meclizine (ANTIVERT) 25 MG tablet Take 25 mg by mouth 2 (two) times daily.  3  . methimazole (TAPAZOLE) 5 MG tablet Take 5 mg by mouth 3 (three) times daily.  2  . Multiple Vitamins-Minerals (MULTIVITAMIN GUMMIES ADULT PO) Take 1 tablet by mouth daily.    . polyethylene glycol (MIRALAX / GLYCOLAX) packet Take 17 g by mouth daily. 14 each 12  . promethazine (PHENERGAN) 12.5 MG tablet Take 12.5 mg by mouth every 12 (twelve) hours as needed for nausea or  vomiting.    . ranitidine (ZANTAC) 150 MG tablet Take 150 mg by mouth at bedtime.    . senna (SENOKOT) 8.6 MG tablet Take 1 tablet by mouth 2 (two) times daily.    . traZODone (DESYREL) 100 MG tablet Take 100 mg by mouth at bedtime.    . ondansetron (ZOFRAN) 4 MG tablet Take 4 mg by mouth every 6 (six) hours as needed for nausea or vomiting.     No current facility-administered medications for this visit.    REVIEW OF SYSTEMS:   Constitutional: Denies fevers, chills or abnormal night sweats Eyes: Denies blurriness of vision, double vision or watery eyes Ears, nose, mouth, throat, and face: Denies mucositis or sore throat Respiratory: Denies cough, dyspnea or wheezes Cardiovascular: Denies palpitation, chest discomfort or lower extremity swelling Gastrointestinal:  Denies nausea, heartburn or change in bowel habits Skin: Denies abnormal skin rashes Lymphatics: Denies new lymphadenopathy or easy bruising Neurological:Denies numbness, tingling or new weaknesses Behavioral/Psych: Mood is stable, no new changes  All other systems were reviewed with the patient and are negative.  PHYSICAL EXAMINATION:   Filed Vitals:   02/08/15 1027  BP: 182/61  Pulse:   Temp:   Resp:    Filed Weights   02/08/15 1026  Weight: 127 lb 14.4 oz (58.015 kg)    GENERAL:alert, no distress and comfortable SKIN: skin color, texture, turgor are normal, no rashes or significant lesions EYES: normal, conjunctiva are pink and non-injected, sclera clear OROPHARYNX:no exudate, no erythema and lips, buccal mucosa, and tongue normal  NECK: supple, thyroid normal size, non-tender, without nodularity LYMPH:  no palpable lymphadenopathy in the cervical, axillary or inguinal LUNGS: clear to auscultation and percussion with normal breathing effort HEART: regular rate & rhythm and no murmurs and no lower extremity edema ABDOMEN:abdomen soft, non-tender and normal bowel sounds Musculoskeletal:no cyanosis of digits  and no clubbing  PSYCH: alert & oriented x 3 with fluent speech NEURO: no focal motor/sensory deficits  LABORATORY DATA:  I have reviewed the data as listed CBC Latest Ref Rng 02/08/2015 08/20/2014 08/13/2014  WBC 3.9 - 10.3 10e3/uL 4.5 6.7 8.8  Hemoglobin 11.6 - 15.9 g/dL 16.1 09.6 11.6(L)  Hematocrit 34.8 - 46.6 % 40.4 38.3 37.5  Platelets 145 - 400 10e3/uL 191 Platelet count consistent in citrate 180 148(L)      Recent Labs  08/13/14 2003 08/20/14 0818  NA 138 137  K 3.4* 4.2  CL 103 103  CO2 26 22  GLUCOSE 123* 131*  BUN 20 24*  CREATININE 0.59 0.41*  CALCIUM 8.9 9.1  GFRNONAA >60 >60  GFRAA >60 >60    RADIOGRAPHIC STUDIES: I have personally reviewed the radiological images as listed and agreed with the findings in the report. No results found.  ASSESSMENT & PLAN:  81 year old Caucasian  female, was referred for abnormal CBC   1. Questionable thrombocytopenia, probable lab error  -Her outside CBC report showed decreased platelet, the platelet count was not reported. She did not have any significant clinical signs of bleeding or easy bruising -I repeated her CBC today, including a citrated tube for platelet, which both showed platelet count normal at 190K.  -The rest of CBC were also normal.Her previous CBC was also normal in 08/2014.  -I do not think she needs further workup at this point. The previous outside abnormal platelet on her CBC on 12/27/2014 was probably a lab error.  -I suggest her to follow-up with her primary care physician, and repeat CBC in 6 months.  No need for further follow up with me, she will call if she has any questions or concerns.   All questions were answered. The patient knows to call the clinic with any problems, questions or concerns. I spent 30 minutes counseling the patient face to face. The total time spent in the appointment was 40 minutes and more than 50% was on counseling.     Malachy MoodFeng, Melford Tullier, MD 02/08/2015 11:23 AM

## 2015-08-04 ENCOUNTER — Emergency Department (HOSPITAL_COMMUNITY): Payer: Medicare Other

## 2015-08-04 ENCOUNTER — Inpatient Hospital Stay (HOSPITAL_COMMUNITY)
Admission: EM | Admit: 2015-08-04 | Discharge: 2015-08-06 | DRG: 192 | Disposition: A | Discharge status: Skilled Nursing Facility | Payer: Medicare Other | Attending: Internal Medicine | Admitting: Internal Medicine

## 2015-08-04 ENCOUNTER — Encounter (HOSPITAL_COMMUNITY): Payer: Self-pay | Admitting: Emergency Medicine

## 2015-08-04 DIAGNOSIS — Z8249 Family history of ischemic heart disease and other diseases of the circulatory system: Secondary | ICD-10-CM

## 2015-08-04 DIAGNOSIS — Z96652 Presence of left artificial knee joint: Secondary | ICD-10-CM | POA: Diagnosis present

## 2015-08-04 DIAGNOSIS — F329 Major depressive disorder, single episode, unspecified: Secondary | ICD-10-CM | POA: Diagnosis not present

## 2015-08-04 DIAGNOSIS — Z96642 Presence of left artificial hip joint: Secondary | ICD-10-CM | POA: Diagnosis present

## 2015-08-04 DIAGNOSIS — E038 Other specified hypothyroidism: Secondary | ICD-10-CM

## 2015-08-04 DIAGNOSIS — J441 Chronic obstructive pulmonary disease with (acute) exacerbation: Principal | ICD-10-CM | POA: Diagnosis present

## 2015-08-04 DIAGNOSIS — E039 Hypothyroidism, unspecified: Secondary | ICD-10-CM | POA: Diagnosis present

## 2015-08-04 DIAGNOSIS — I10 Essential (primary) hypertension: Secondary | ICD-10-CM | POA: Diagnosis present

## 2015-08-04 DIAGNOSIS — Z79899 Other long term (current) drug therapy: Secondary | ICD-10-CM

## 2015-08-04 DIAGNOSIS — D696 Thrombocytopenia, unspecified: Secondary | ICD-10-CM | POA: Diagnosis present

## 2015-08-04 DIAGNOSIS — I480 Paroxysmal atrial fibrillation: Secondary | ICD-10-CM | POA: Diagnosis present

## 2015-08-04 DIAGNOSIS — Z7982 Long term (current) use of aspirin: Secondary | ICD-10-CM

## 2015-08-04 DIAGNOSIS — Z96649 Presence of unspecified artificial hip joint: Secondary | ICD-10-CM | POA: Diagnosis present

## 2015-08-04 DIAGNOSIS — F32A Depression, unspecified: Secondary | ICD-10-CM | POA: Diagnosis present

## 2015-08-04 DIAGNOSIS — E876 Hypokalemia: Secondary | ICD-10-CM | POA: Diagnosis present

## 2015-08-04 DIAGNOSIS — R0600 Dyspnea, unspecified: Secondary | ICD-10-CM | POA: Insufficient documentation

## 2015-08-04 DIAGNOSIS — G4700 Insomnia, unspecified: Secondary | ICD-10-CM | POA: Diagnosis present

## 2015-08-04 DIAGNOSIS — Z66 Do not resuscitate: Secondary | ICD-10-CM | POA: Diagnosis present

## 2015-08-04 LAB — COMPREHENSIVE METABOLIC PANEL
ALBUMIN: 3.4 g/dL — AB (ref 3.5–5.0)
ALT: 18 U/L (ref 14–54)
AST: 20 U/L (ref 15–41)
Alkaline Phosphatase: 63 U/L (ref 38–126)
Anion gap: 9 (ref 5–15)
BUN: 24 mg/dL — AB (ref 6–20)
CHLORIDE: 99 mmol/L — AB (ref 101–111)
CO2: 29 mmol/L (ref 22–32)
CREATININE: 0.6 mg/dL (ref 0.44–1.00)
Calcium: 8.9 mg/dL (ref 8.9–10.3)
GFR calc Af Amer: >60 mL/min (ref >60)
GLUCOSE: 125 mg/dL — AB (ref 65–99)
Potassium: 3.5 mmol/L (ref 3.5–5.1)
Sodium: 137 mmol/L (ref 135–145)
Total Bilirubin: 0.3 mg/dL (ref 0.3–1.2)
Total Protein: 7 g/dL (ref 6.5–8.1)

## 2015-08-04 LAB — CBC WITH DIFFERENTIAL/PLATELET
BASOS ABS: 0 10*3/uL (ref 0.0–0.1)
Basophils Relative: 0 %
Eosinophils Absolute: 0.2 10*3/uL (ref 0.0–0.7)
Eosinophils Relative: 5 %
HEMATOCRIT: 38.1 % (ref 36.0–46.0)
Hemoglobin: 12.2 g/dL (ref 12.0–15.0)
LYMPHS ABS: 1 10*3/uL (ref 0.7–4.0)
LYMPHS PCT: 19 %
MCH: 26.2 pg (ref 26.0–34.0)
MCHC: 32 g/dL (ref 30.0–36.0)
MCV: 81.9 fL (ref 78.0–100.0)
Monocytes Absolute: 0.6 10*3/uL (ref 0.1–1.0)
Monocytes Relative: 13 %
NEUTROS PCT: 63 %
Neutro Abs: 3.1 10*3/uL (ref 1.7–7.7)
Platelets: 105 10*3/uL — ABNORMAL LOW (ref 150–400)
RBC: 4.65 MIL/uL (ref 3.87–5.11)
RDW: 15 % (ref 11.5–15.5)
WBC: 4.9 10*3/uL (ref 4.0–10.5)

## 2015-08-04 LAB — PROCALCITONIN: Procalcitonin: <0.1 ng/mL

## 2015-08-04 LAB — I-STAT TROPONIN, ED: TROPONIN I, POC: 0.01 ng/mL (ref 0.00–0.08)

## 2015-08-04 MED ORDER — BISACODYL 10 MG RE SUPP: 10.0000 mg | Freq: Every day | RECTAL | Status: DC | PRN

## 2015-08-04 MED ORDER — PAROXETINE HCL 20 MG PO TABS
40.0000 mg | ORAL_TABLET | Freq: Every day | ORAL | Status: DC
  Administered 2015-08-05 – 2015-08-06 (×2): 40 mg via ORAL
  Filled 2015-08-04 (×2): qty 2

## 2015-08-04 MED ORDER — ACETAMINOPHEN 325 MG PO TABS
325.0000 mg | ORAL_TABLET | ORAL | Status: DC | PRN
  Administered 2015-08-04: 325 mg via ORAL
  Filled 2015-08-04 (×2): qty 1

## 2015-08-04 MED ORDER — TRAZODONE HCL 100 MG PO TABS
100.0000 mg | ORAL_TABLET | Freq: Every day | ORAL | Status: DC
  Administered 2015-08-04 – 2015-08-05 (×2): 100 mg via ORAL
  Filled 2015-08-04 (×3): qty 1

## 2015-08-04 MED ORDER — METHYLPREDNISOLONE SODIUM SUCC 125 MG IJ SOLR
60.0000 mg | Freq: Four times a day (QID) | INTRAMUSCULAR | Status: DC
  Administered 2015-08-04 – 2015-08-06 (×6): 60 mg via INTRAVENOUS
  Filled 2015-08-04 (×10): qty 0.96

## 2015-08-04 MED ORDER — LEVOFLOXACIN 500 MG PO TABS
500.0000 mg | ORAL_TABLET | Freq: Once | ORAL | Status: AC
  Administered 2015-08-04: 500 mg via ORAL
  Filled 2015-08-04: qty 1

## 2015-08-04 MED ORDER — AMLODIPINE BESYLATE 5 MG PO TABS
5.0000 mg | ORAL_TABLET | Freq: Every day | ORAL | Status: DC
  Administered 2015-08-05 – 2015-08-06 (×2): 5 mg via ORAL
  Filled 2015-08-04 (×2): qty 1

## 2015-08-04 MED ORDER — PROMETHAZINE HCL 25 MG PO TABS
25.0000 mg | ORAL_TABLET | Freq: Four times a day (QID) | ORAL | Status: DC | PRN
  Administered 2015-08-05: 25 mg via ORAL
  Filled 2015-08-04 (×2): qty 1

## 2015-08-04 MED ORDER — POLYETHYLENE GLYCOL 3350 17 G PO PACK
17.0000 g | PACK | Freq: Two times a day (BID) | ORAL | Status: DC
  Administered 2015-08-04 – 2015-08-06 (×4): 17 g via ORAL
  Filled 2015-08-04 (×6): qty 1

## 2015-08-04 MED ORDER — ADULT MULTIVITAMIN W/MINERALS CH
1.0000 | ORAL_TABLET | Freq: Every day | ORAL | Status: DC
  Administered 2015-08-05 – 2015-08-06 (×2): 1 via ORAL
  Filled 2015-08-04 (×2): qty 1

## 2015-08-04 MED ORDER — LORAZEPAM 0.5 MG PO TABS
0.2500 mg | ORAL_TABLET | Freq: Once | ORAL | Status: AC
  Administered 2015-08-05: 0.25 mg via ORAL
  Filled 2015-08-04: qty 1

## 2015-08-04 MED ORDER — METHYLPREDNISOLONE SODIUM SUCC 125 MG IJ SOLR
125.0000 mg | Freq: Once | INTRAMUSCULAR | Status: DC
  Filled 2015-08-04: qty 2

## 2015-08-04 MED ORDER — LEVOFLOXACIN IN D5W 750 MG/150ML IV SOLN: 750.0000 mg | INTRAVENOUS | Status: DC

## 2015-08-04 MED ORDER — ENOXAPARIN SODIUM 40 MG/0.4ML ~~LOC~~ SOLN
40.0000 mg | SUBCUTANEOUS | Status: DC
  Administered 2015-08-04 – 2015-08-05 (×2): 40 mg via SUBCUTANEOUS
  Filled 2015-08-04 (×3): qty 0.4

## 2015-08-04 MED ORDER — IPRATROPIUM-ALBUTEROL 0.5-2.5 (3) MG/3ML IN SOLN
3.0000 mL | RESPIRATORY_TRACT | Status: DC | PRN
  Filled 2015-08-04: qty 3

## 2015-08-04 MED ORDER — ALBUTEROL SULFATE (2.5 MG/3ML) 0.083% IN NEBU
5.0000 mg | INHALATION_SOLUTION | Freq: Once | RESPIRATORY_TRACT | Status: AC
  Administered 2015-08-04: 5 mg via RESPIRATORY_TRACT
  Filled 2015-08-04: qty 6

## 2015-08-04 MED ORDER — LISINOPRIL 40 MG PO TABS
40.0000 mg | ORAL_TABLET | Freq: Every day | ORAL | Status: DC
  Administered 2015-08-05 – 2015-08-06 (×2): 40 mg via ORAL
  Filled 2015-08-04 (×2): qty 1

## 2015-08-04 MED ORDER — MIRTAZAPINE 7.5 MG PO TABS
7.5000 mg | ORAL_TABLET | Freq: Every day | ORAL | Status: DC
  Administered 2015-08-04 – 2015-08-05 (×2): 7.5 mg via ORAL
  Filled 2015-08-04 (×3): qty 1

## 2015-08-04 MED ORDER — SENNOSIDES-DOCUSATE SODIUM 8.6-50 MG PO TABS
1.0000 | ORAL_TABLET | Freq: Two times a day (BID) | ORAL | Status: DC
  Administered 2015-08-04 – 2015-08-05 (×2): 1 via ORAL
  Filled 2015-08-04 (×5): qty 1

## 2015-08-04 MED ORDER — VITAMIN D3 25 MCG (1000 UNIT) PO TABS
2000.0000 [IU] | ORAL_TABLET | Freq: Every day | ORAL | Status: DC
  Administered 2015-08-05 – 2015-08-06 (×2): 2000 [IU] via ORAL
  Filled 2015-08-04 (×2): qty 2

## 2015-08-04 MED ORDER — AMITRIPTYLINE HCL 75 MG PO TABS
75.0000 mg | ORAL_TABLET | Freq: Every day | ORAL | Status: DC
  Administered 2015-08-04 – 2015-08-05 (×2): 75 mg via ORAL
  Filled 2015-08-04 (×3): qty 1

## 2015-08-04 MED ORDER — HYDROCHLOROTHIAZIDE 25 MG PO TABS
25.0000 mg | ORAL_TABLET | Freq: Every day | ORAL | Status: DC
  Administered 2015-08-05 – 2015-08-06 (×2): 25 mg via ORAL
  Filled 2015-08-04 (×2): qty 1

## 2015-08-04 MED ORDER — DICLOFENAC SODIUM 1 % TD GEL
1.0000 "application " | Freq: Two times a day (BID) | TRANSDERMAL | Status: DC | PRN
  Filled 2015-08-04: qty 100

## 2015-08-04 MED ORDER — METHYLPREDNISOLONE SODIUM SUCC 125 MG IJ SOLR
125.0000 mg | Freq: Once | INTRAMUSCULAR | Status: AC
  Administered 2015-08-04: 125 mg via INTRAMUSCULAR
  Filled 2015-08-04: qty 2

## 2015-08-04 MED ORDER — GUAIFENESIN 100 MG/5ML PO SOLN: 10.0000 mL | Freq: Four times a day (QID) | ORAL | Status: DC | PRN

## 2015-08-04 MED ORDER — AMIODARONE HCL 100 MG PO TABS
100.0000 mg | ORAL_TABLET | Freq: Every day | ORAL | Status: DC
  Administered 2015-08-05 – 2015-08-06 (×2): 100 mg via ORAL
  Filled 2015-08-04 (×2): qty 1

## 2015-08-04 MED ORDER — POTASSIUM CHLORIDE IN NACL 20-0.9 MEQ/L-% IV SOLN
INTRAVENOUS | Status: AC
  Administered 2015-08-04: 21:00:00 via INTRAVENOUS
  Filled 2015-08-04 (×2): qty 1000

## 2015-08-04 MED ORDER — ALBUTEROL (5 MG/ML) CONTINUOUS INHALATION SOLN
10.0000 mg/h | INHALATION_SOLUTION | RESPIRATORY_TRACT | Status: DC
  Administered 2015-08-04: 10 mg/h via RESPIRATORY_TRACT
  Filled 2015-08-04: qty 20

## 2015-08-04 MED ORDER — SACCHAROMYCES BOULARDII 250 MG PO CAPS
250.0000 mg | ORAL_CAPSULE | Freq: Two times a day (BID) | ORAL | Status: DC
  Administered 2015-08-04 – 2015-08-06 (×4): 250 mg via ORAL
  Filled 2015-08-04 (×5): qty 1

## 2015-08-04 MED ORDER — LEVOTHYROXINE SODIUM 50 MCG PO TABS
50.0000 ug | ORAL_TABLET | Freq: Every day | ORAL | Status: DC
  Administered 2015-08-05 – 2015-08-06 (×2): 50 ug via ORAL
  Filled 2015-08-04 (×3): qty 1

## 2015-08-04 NOTE — ED Notes (Signed)
Pt is on RA not 6L

## 2015-08-04 NOTE — Clinical Social Work Note (Signed)
Clinical Social Work Assessment  Patient Details  Name: Paula Bird MRN: [id number] Date of Birth: 11/14/1925  Date of referral:  08/04/15               Reason for consult:   (Patient is from facility. [facility].)                Permission sought to share information with:   (None.) Permission granted to share information::  No  Name::        Agency::     Relationship::     Contact Information:     Housing/Transportation Living arrangements for the past 2 months:  [place] (Patient informed CSW that she has been living at [facility] for 1 year,) Source of Information:  Patient, Adult Children Patient Interpreter Needed:  None Criminal Activity/Legal Involvement Pertinent to Current Situation/Hospitalization:  No - Comment as needed Significant Relationships:  Adult Children (Son states that she is pt's POA.) Lives with:  Facility Resident Do you feel safe going back to the place where you live?  Yes Need for family participation in patient care:  Yes (Comment) (Son is primary support.)  Care giving concerns:  There are no care giving concerns at this time. Patient is from facility. She states that she receives assistance from staff when needed, but usually completes her ADL's independently.    Social Worker assessment / plan:  CSW met with patient at bedside. Patient was alert and oriented. Son was present. Patient confirms that she is from [place]. Patient confirms that she presents to [facility] due to cough.  Patient informed CSW that she ambulates using a walker. Son states that pt has not fallen within a year. Patient states that upon discharge she would feel safe to return to facility.  Employment status:  Retired Forensic scientist:   Nurse, mental health) PT Recommendations:  Not assessed at this time Information / Referral to community resources:   (Patient is from facility. Patient states if rehab is needes he would like home health. )  Patient/Family's Response to  care:  Patient is aware that she will be admitted and is accepting at this time.  Patient/Family's Understanding of and Emotional Response to Diagnosis, Current Treatment, and Prognosis:  Patient states that she has no questions for CSW.  Emotional Assessment Appearance:  Appears stated age Attitude/Demeanor/Rapport:   (Appropriate.) Affect (typically observed):  Accepting, Appropriate Orientation:  Oriented to Self, Oriented to Place, Oriented to  Time, Oriented to Situation Alcohol / Substance use:  Not Applicable Psych involvement (Current and /or in the community):  No (Comment)  Discharge Needs  Concerns to be addressed:  Adjustment to Illness Readmission within the last 30 days:  No Current discharge risk:  None Barriers to Discharge:  No Barriers Identified   Bernita Buffy, LCSW 08/04/2015, 10:46 PM

## 2015-08-04 NOTE — ED Provider Notes (Signed)
CSN: [id number]     Arrival date & time 08/04/15  1520 History   First MD Initiated Contact with Patient 08/04/15 1528     Chief Complaint  Patient presents with  . Cough  . Nasal Congestion     (Consider location/radiation/quality/duration/timing/severity/associated sxs/prior Treatment) HPI Patient presents by EMS from nursing home. States she's had cough and shortness of breath the last 2-3 days. Increased fatigue. Recently placed on antibiotics but has had no improvement. No new lower extremity swelling or pain. No recent falls. States she has a breathing machine that she uses occasionally. Patient does complain of central chest pain. This is worse with deep breathing and coughing. Past Medical History  Diagnosis Date  . Hypertension   . Thyroid disease   . Vertigo    Past Surgical History  Procedure Laterality Date  . Abdominal hysterectomy    . Appendectomy    . Total hip arthroplasty Left   . Total knee arthroplasty Left    Family History  Problem Relation Age of Onset  . Hypertension Mother   . Hypertension Son   . Diabetes Neg Hx    Social History  Substance Use Topics  . Smoking status: Never Smoker   . Smokeless tobacco: None  . Alcohol Use: No   OB History    No data available     Review of Systems  Constitutional: Positive for fatigue. Negative for fever and chills.  HENT: Negative for sinus pressure and sore throat.   Respiratory: Positive for cough, shortness of breath and wheezing.   Cardiovascular: Positive for chest pain.  Gastrointestinal: Negative for nausea, vomiting, abdominal pain, diarrhea and constipation.  Musculoskeletal: Negative for myalgias, back pain, neck pain and neck stiffness.  Skin: Negative for rash and wound.  Neurological: Negative for dizziness, weakness, light-headedness, numbness and headaches.  All other systems reviewed and are negative.     Allergies  Review of patient's allergies indicates no known  allergies.  Home Medications   Prior to Admission medications   Medication Sig Start Date End Date Taking? Authorizing Provider  acetaminophen (TYLENOL) 325 MG tablet Take 325 mg by mouth every 4 (four) hours as needed for mild pain, moderate pain, fever or headache.   Yes Historical Provider, MD  amiodarone (PACERONE) 200 MG tablet Take 100 mg by mouth daily.    Yes Historical Provider, MD  amitriptyline (ELAVIL) 75 MG tablet Take 75 mg by mouth at bedtime. 07/12/15  Yes Historical Provider, MD  amLODipine (NORVASC) 5 MG tablet Take 5 mg by mouth daily.   Yes Historical Provider, MD  aspirin 325 MG tablet Take 325 mg by mouth daily.   Yes Historical Provider, MD  bisacodyl (DULCOLAX) 10 MG suppository Place 10 mg rectally daily as needed for moderate constipation.   Yes Historical Provider, MD  cholecalciferol (VITAMIN D) 1000 UNITS tablet Take 2,000 Units by mouth daily.   Yes Historical Provider, MD  Cranberry 425 MG CAPS Take 425 mg by mouth 2 (two) times daily.   Yes Historical Provider, MD  diclofenac sodium (VOLTAREN) 1 % GEL Apply 1 application topically daily. 06/23/15  Yes Historical Provider, MD  GuaiFENesin 150 MG/15ML LIQD Take 1 Dose by mouth 4 (four) times daily as needed (cough).   Yes Historical Provider, MD  hydrochlorothiazide (HYDRODIURIL) 25 MG tablet Take 25 mg by mouth daily. 01/12/15  Yes Historical Provider, MD  levofloxacin (LEVAQUIN) 500 MG tablet Take 500 mg by mouth daily.   Yes Historical Provider, MD  levothyroxine (  SYNTHROID, LEVOTHROID) 50 MCG tablet Take 50 mcg by mouth daily before breakfast.   Yes Historical Provider, MD  lisinopril (PRINIVIL,ZESTRIL) 40 MG tablet Take 40 mg by mouth daily. 08/04/15  Yes Historical Provider, MD  mirtazapine (REMERON) 7.5 MG tablet Take 7.5 mg by mouth at bedtime. 07/12/15  Yes Historical Provider, MD  Multiple Vitamins-Minerals (MULTIVITAMIN GUMMIES ADULT PO) Take 1 tablet by mouth daily.   Yes Historical Provider, MD  PARoxetine  (PAXIL) 40 MG tablet Take 40 mg by mouth daily. 07/23/15  Yes Historical Provider, MD  polyethylene glycol (MIRALAX / GLYCOLAX) packet Take 17 g by mouth daily. Patient taking differently: Take 17 g by mouth 2 (two) times daily.  08/27/14  Yes Arthor CaptainAbigail Harris, PA-C  promethazine (PHENERGAN) 25 MG tablet Take 25 mg by mouth every 6 (six) hours as needed for nausea or vomiting.   Yes Historical Provider, MD  saccharomyces boulardii (FLORASTOR) 250 MG capsule Take 250 mg by mouth 2 (two) times daily.   Yes Historical Provider, MD  sennosides-docusate sodium (SENOKOT-S) 8.6-50 MG tablet Take 1 tablet by mouth 2 (two) times daily.   Yes Historical Provider, MD  SUMAtriptan (IMITREX) 25 MG tablet Take 25 mg by mouth daily as needed for migraine. May repeat in 2 hours if headache persists or recurs.   Yes Historical Provider, MD  traZODone (DESYREL) 100 MG tablet Take 100 mg by mouth at bedtime.   Yes Historical Provider, MD  acetaminophen-codeine (TYLENOL #3) 300-30 MG per tablet Take 1 tablet by mouth every 6 (six) hours as needed for moderate pain or severe pain. Patient not taking: Reported on 08/04/2015 08/27/14   Arthor CaptainAbigail Harris, PA-C  dicyclomine (BENTYL) 20 MG tablet Take 1 tablet (20 mg total) by mouth 2 (two) times daily. Patient not taking: Reported on 08/04/2015 08/27/14   Arthor CaptainAbigail Harris, PA-C   BP 122/57 mmHg  Pulse 68  Temp(Src) 97.7 F (36.5 C) (Oral)  Resp 18  Ht 5\' 1"  (1.549 m)  Wt 139 lb 15.9 oz (63.5 kg)  BMI 26.46 kg/m2  SpO2 92% Physical Exam  Constitutional: She is oriented to person, place, and time. She appears well-developed and well-nourished. No distress.  HENT:  Head: Normocephalic.  Patient with excoriated skin on the tip of her nose. There is no active bleeding. Mucosa is dry.  Eyes: EOM are normal. Pupils are equal, round, and reactive to light.  Neck: Normal range of motion. Neck supple. No JVD present.  No meningismus.  Cardiovascular: Normal rate and regular rhythm.   Exam reveals no gallop and no friction rub.   No murmur heard. Pulmonary/Chest: Effort normal. No respiratory distress. She has wheezes. She has no rales. She exhibits tenderness (tenderness to palpation across the central chest.).  Scattered rhonchi with prolonged expiratory phase and expiratory wheezing  Abdominal: Soft. Bowel sounds are normal. She exhibits no distension and no mass. There is no tenderness. There is no rebound and no guarding.  Musculoskeletal: Normal range of motion. She exhibits no edema or tenderness.  No lower extremity asymmetry or tenderness. Distal pulses are equal and intact.  Neurological: She is alert and oriented to person, place, and time.  5/5 motor in all extremities. Sensation is fully intact.  Skin: Skin is warm and dry. No rash noted. No erythema.  Psychiatric: She has a normal mood and affect. Her behavior is normal.  Nursing note and vitals reviewed.   ED Course  Procedures (including critical care time) Labs Review Labs Reviewed  CBC WITH DIFFERENTIAL/PLATELET -  Abnormal; Notable for the following:    Platelets 105 (*)    All other components within normal limits  COMPREHENSIVE METABOLIC PANEL - Abnormal; Notable for the following:    Chloride 99 (*)    Glucose, Bld 125 (*)    BUN 24 (*)    Albumin 3.4 (*)    All other components within normal limits  BASIC METABOLIC PANEL - Abnormal; Notable for the following:    Potassium 3.4 (*)    Glucose, Bld 199 (*)    BUN 30 (*)    Calcium 8.8 (*)    All other components within normal limits  CBC - Abnormal; Notable for the following:    Hemoglobin 11.0 (*)    HCT 35.2 (*)    All other components within normal limits  GLUCOSE, CAPILLARY - Abnormal; Notable for the following:    Glucose-Capillary 153 (*)    All other components within normal limits  BASIC METABOLIC PANEL - Abnormal; Notable for the following:    Glucose, Bld 164 (*)    BUN 27 (*)    All other components within normal limits   CBC - Abnormal; Notable for the following:    WBC 11.6 (*)    Hemoglobin 11.0 (*)    HCT 35.2 (*)    MCH 25.9 (*)    Platelets 122 (*)    All other components within normal limits  MRSA PCR SCREENING  CULTURE, EXPECTORATED SPUTUM-ASSESSMENT  PROCALCITONIN  PROCALCITONIN  I-STAT TROPOININ, ED    Imaging Review Dg Chest 2 View  08/04/2015  CLINICAL DATA:  Productive cough. EXAM: CHEST  2 VIEW COMPARISON:  Aug 13, 2014. FINDINGS: The heart size and mediastinal contours are within normal limits. Both lungs are clear. Old left rib fracture is noted. Atherosclerosis of thoracic aorta is noted. Anterior osteophyte formation is noted in lower thoracic spine. IMPRESSION: No active cardiopulmonary disease. Electronically Signed   By: Lupita Raider, M.D.   On: 08/04/2015 16:28   I have personally reviewed and evaluated these images and lab results as part of my medical decision-making.   EKG Interpretation   Date/Time:  Thursday Aug 04 2015 16:01:26 EDT Ventricular Rate:  77 PR Interval:  215 QRS Duration: 79 QT Interval:  383 QTC Calculation: 433 R Axis:   -29 Text Interpretation:  Sinus rhythm Borderline prolonged PR interval  Borderline left axis deviation Consider anterior infarct Nonspecific T  abnormalities, lateral leads since last tracing no significant change  Confirmed by MILLER  MD, BRIAN ([id number]) on 08/05/2015 8:10:16 PM      MDM   Final diagnoses:  Dyspnea    Patient presents with cough, shortness breath, wheezing and pleuritic chest pain.  She breathing treatment with some improvement of wheezing though in the door wheezes still present. We'll cover with Levaquin and will likely need admission. Signed out to oncoming emergency physician.  Loren Racer, MD 08/06/15 [phone]

## 2015-08-04 NOTE — ED Notes (Signed)
Bed: WA08 Expected date:  Expected time:  Means of arrival:  Comments: EMS-eval from nursing home

## 2015-08-04 NOTE — ED Notes (Signed)
Pt has not felt well for last 2 weeks.  Was seen by in house provider yesterday, given antibiotics and began them yesterday.  Feels no better today so staff wants her evaluated by ED today.  BP 140/76 P 78 95% on 2L. Afebrile.  Pt does not use O2 at facility.  Pt is from [facility] and is a DNR.

## 2015-08-04 NOTE — ED Notes (Signed)
2 attempts at IV by Cordelia PenSherry, RN

## 2015-08-04 NOTE — ED Notes (Signed)
Neb paused to give Pt sandwich and soda

## 2015-08-04 NOTE — ED Notes (Signed)
2 IV attempts by this RN 

## 2015-08-04 NOTE — ED Notes (Signed)
Pt reports good improvement in breathing and diminished coughing with completion of breathing Tx.

## 2015-08-05 DIAGNOSIS — F329 Major depressive disorder, single episode, unspecified: Secondary | ICD-10-CM | POA: Diagnosis present

## 2015-08-05 DIAGNOSIS — Z8249 Family history of ischemic heart disease and other diseases of the circulatory system: Secondary | ICD-10-CM | POA: Diagnosis not present

## 2015-08-05 DIAGNOSIS — Z66 Do not resuscitate: Secondary | ICD-10-CM | POA: Diagnosis present

## 2015-08-05 DIAGNOSIS — J441 Chronic obstructive pulmonary disease with (acute) exacerbation: Secondary | ICD-10-CM | POA: Diagnosis present

## 2015-08-05 DIAGNOSIS — D696 Thrombocytopenia, unspecified: Secondary | ICD-10-CM | POA: Diagnosis present

## 2015-08-05 DIAGNOSIS — E039 Hypothyroidism, unspecified: Secondary | ICD-10-CM | POA: Diagnosis present

## 2015-08-05 DIAGNOSIS — E876 Hypokalemia: Secondary | ICD-10-CM | POA: Diagnosis present

## 2015-08-05 DIAGNOSIS — I480 Paroxysmal atrial fibrillation: Secondary | ICD-10-CM | POA: Diagnosis present

## 2015-08-05 DIAGNOSIS — Z96649 Presence of unspecified artificial hip joint: Secondary | ICD-10-CM | POA: Diagnosis present

## 2015-08-05 DIAGNOSIS — I10 Essential (primary) hypertension: Secondary | ICD-10-CM | POA: Diagnosis present

## 2015-08-05 DIAGNOSIS — Z79899 Other long term (current) drug therapy: Secondary | ICD-10-CM | POA: Diagnosis not present

## 2015-08-05 DIAGNOSIS — G4700 Insomnia, unspecified: Secondary | ICD-10-CM | POA: Diagnosis present

## 2015-08-05 DIAGNOSIS — R0600 Dyspnea, unspecified: Secondary | ICD-10-CM | POA: Diagnosis present

## 2015-08-05 DIAGNOSIS — Z7982 Long term (current) use of aspirin: Secondary | ICD-10-CM | POA: Diagnosis not present

## 2015-08-05 DIAGNOSIS — Z96642 Presence of left artificial hip joint: Secondary | ICD-10-CM | POA: Diagnosis present

## 2015-08-05 DIAGNOSIS — Z96652 Presence of left artificial knee joint: Secondary | ICD-10-CM | POA: Diagnosis present

## 2015-08-05 LAB — CBC
HEMATOCRIT: 35.2 % — AB (ref 36.0–46.0)
HEMOGLOBIN: 11 g/dL — AB (ref 12.0–15.0)
MCH: 26.1 pg (ref 26.0–34.0)
MCHC: 31.3 g/dL (ref 30.0–36.0)
MCV: 83.6 fL (ref 78.0–100.0)
PLATELETS: UNDETERMINED 10*3/uL (ref 150–400)
RBC: 4.21 MIL/uL (ref 3.87–5.11)
RDW: 15.3 % (ref 11.5–15.5)
WBC: 4.7 10*3/uL (ref 4.0–10.5)

## 2015-08-05 LAB — BASIC METABOLIC PANEL
Anion gap: 12 (ref 5–15)
BUN: 30 mg/dL — ABNORMAL HIGH (ref 6–20)
CHLORIDE: 105 mmol/L (ref 101–111)
CO2: 24 mmol/L (ref 22–32)
CREATININE: 0.55 mg/dL (ref 0.44–1.00)
Calcium: 8.8 mg/dL — ABNORMAL LOW (ref 8.9–10.3)
GFR calc non Af Amer: >60 mL/min (ref >60)
Glucose, Bld: 199 mg/dL — ABNORMAL HIGH (ref 65–99)
POTASSIUM: 3.4 mmol/L — AB (ref 3.5–5.1)
Sodium: 141 mmol/L (ref 135–145)

## 2015-08-05 LAB — GLUCOSE, CAPILLARY: Glucose-Capillary: 153 mg/dL — ABNORMAL HIGH (ref 65–99)

## 2015-08-05 LAB — MRSA PCR SCREENING: MRSA by PCR: NEGATIVE

## 2015-08-05 MED ORDER — POTASSIUM CHLORIDE CRYS ER 20 MEQ PO TBCR
40.0000 meq | EXTENDED_RELEASE_TABLET | Freq: Once | ORAL | Status: AC
  Administered 2015-08-05: 40 meq via ORAL
  Filled 2015-08-05: qty 2

## 2015-08-05 MED ORDER — LEVOFLOXACIN IN D5W 250 MG/50ML IV SOLN
250.0000 mg | INTRAVENOUS | Status: DC
  Administered 2015-08-05: 250 mg via INTRAVENOUS
  Filled 2015-08-05: qty 50

## 2015-08-05 MED ORDER — HYDROCODONE-ACETAMINOPHEN 5-325 MG PO TABS
2.0000 | ORAL_TABLET | Freq: Once | ORAL | Status: AC
  Administered 2015-08-05: 2 via ORAL
  Filled 2015-08-05: qty 2

## 2015-08-05 NOTE — Progress Notes (Signed)
PROGRESS NOTE    Paula Bird  ZOX:[id number] DOB: 07/26/1925 DOA: 08/04/2015 PCP: Paula JennyRIPP, [name], MD  Outpatient Specialists:   Brief Narrative: Paula Bird is a pleasant 82 year old female with multiple comorbidities including chronic obstructive pulmonary disease to presented as a transfer from her skilled nursing facility to the emergency room on 08/04/2015 presented with complaints of cough, shortness of breath and wheezing. Symptoms felt to be secondary to COPD exacerbation as she was started on systemic steroids, and a religion treatments, empiric antibiotic therapy.   Assessment & Plan:   Principal Problem:   Obstructive chronic bronchitis with acute exacerbation (HCC) Active Problems:   Hypertension   Depression   Insomnia   Thrombocytopenia (HCC)   Paroxysmal atrial fibrillation (HCC)   Acute exacerbation of chronic obstructive bronchitis (HCC)   Hypothyroidism  1.  COPD exacerbation.  -Paula Bird presenting to the emergency room with complaints of cough associate with wheezing, shortness of breath, symptoms felt to be secondary to COPD exacerbation. She reported having history of secondhand exposure. -Chest x-ray did not reveal acute cardiopulmonary disease -Plan to continue Solu-Medrol 60 mg IV every 6 hours, duo nebs every 4 hours, empiric antibiotic therapy with Levaquin.  2.  History of paroxysmal atrial fibrillation -She is not anticoagulated, has CHADSVasc score of 4 -Continue aspirin therapy and amiodarone 100 mg by mouth daily  3.  Hypertension -Blood pressure stable -Continue hydrochlorothiazide 25 mg by mouth daily, lisinopril 40 mg by mouth daily and amlodipine 5 mg by mouth daily  4.  Hypothyroidism -Continue Synthroid 50 g by mouth daily  5. Hypokalemia -AM labs show potassium of 3.4, will provide oral potassium replacement with Kdur   DVT prophylaxis: Lovenox Code Status: DO NOT RESUSCITATE Family Communication:  Disposition Plan:  Anticipate discharge back to skilled nursing facility when medically stable  Antimicrobials:  Levaquin started on 08/04/2015   Subjective: This morning she complains of ongoing shortness of breath, cough, wheezing  Objective: Filed Vitals:   08/04/15 2200 08/05/15 0600 08/05/15 1454 08/05/15 1513  BP: 128/90 137/61 131/62 147/64  Pulse: 90 71 88 88  Temp: 99 F (37.2 C) 99.2 F (37.3 C) 98.6 F (37 C) 98.5 F (36.9 C)  TempSrc: Oral Oral Oral Oral  Resp: 20 22 20 18   Height:      Weight:      SpO2: 98% 98% 93% 92%    Intake/Output Summary (Last 24 hours) at 08/05/15 1643 Last data filed at 08/05/15 1417  Gross per 24 hour  Intake 1122.5 ml  Output      1 ml  Net 1121.5 ml   Filed Weights   08/04/15 2015  Weight: 63.5 kg (139 lb 15.9 oz)    Examination:  General exam: Ill-appearing although does not appear to be in acute respiratory distress Respiratory system: Diminished breath sounds bilaterally with extensive bilateral expiratory wheezes, having prolonged expiratory phase Cardiovascular system: S1 & S2 heard, RRR. No JVD, murmurs, rubs, gallops or clicks. No pedal edema. Gastrointestinal system: Abdomen is nondistended, soft and nontender. No organomegaly or masses felt. Normal bowel sounds heard. Central nervous system: Alert and oriented. No focal neurological deficits. Extremities: Symmetric 5 x 5 power. Psychiatry: She has some mild cognitive impairment    Data Reviewed: I have personally reviewed following labs and imaging studies  CBC:  Recent Labs Lab 08/04/15 1645 08/05/15 0520  WBC 4.9 4.7  NEUTROABS 3.1  --   HGB 12.2 11.0*  HCT 38.1 35.2*  MCV 81.9 83.6  PLT 105*  PLATELET CLUMPS NOTED ON SMEAR, UNABLE TO ESTIMATE   Basic Metabolic Panel:  Recent Labs Lab 08/04/15 1645 08/05/15 0520  NA 137 141  K 3.5 3.4*  CL 99* 105  CO2 29 24  GLUCOSE 125* 199*  BUN 24* 30*  CREATININE 0.60 0.55  CALCIUM 8.9 8.8*   GFR: Estimated  Creatinine Clearance: 42.3 mL/min (by C-G formula based on Cr of 0.55). Liver Function Tests:  Recent Labs Lab 08/04/15 1645  AST 20  ALT 18  ALKPHOS 63  BILITOT 0.3  PROT 7.0  ALBUMIN 3.4*   No results for input(s): LIPASE, AMYLASE in the last 168 hours. No results for input(s): AMMONIA in the last 168 hours. Coagulation Profile: No results for input(s): INR, PROTIME in the last 168 hours. Cardiac Enzymes: No results for input(s): CKTOTAL, CKMB, CKMBINDEX, TROPONINI in the last 168 hours. BNP (last 3 results) No results for input(s): PROBNP in the last 8760 hours. HbA1C: No results for input(s): HGBA1C in the last 72 hours. CBG:  Recent Labs Lab 08/05/15 0736  GLUCAP 153*   Lipid Profile: No results for input(s): CHOL, HDL, LDLCALC, TRIG, CHOLHDL, LDLDIRECT in the last 72 hours. Thyroid Function Tests: No results for input(s): TSH, T4TOTAL, FREET4, T3FREE, THYROIDAB in the last 72 hours. Anemia Panel: No results for input(s): VITAMINB12, FOLATE, FERRITIN, TIBC, IRON, RETICCTPCT in the last 72 hours. Urine analysis:    Component Value Date/Time   COLORURINE YELLOW 08/13/2014 2256   APPEARANCEUR CLEAR 08/13/2014 2256   LABSPEC 1.016 08/13/2014 2256   PHURINE 6.5 08/13/2014 2256   GLUCOSEU NEGATIVE 08/13/2014 2256   HGBUR NEGATIVE 08/13/2014 2256   BILIRUBINUR NEGATIVE 08/13/2014 2256   KETONESUR NEGATIVE 08/13/2014 2256   PROTEINUR NEGATIVE 08/13/2014 2256   UROBILINOGEN 0.2 08/13/2014 2256   NITRITE NEGATIVE 08/13/2014 2256   LEUKOCYTESUR MODERATE* 08/13/2014 2256   Sepsis Labs:  Recent Labs Lab 08/04/15 2011  PROCALCITON <0.10    Recent Results (from the past 240 hour(s))  MRSA PCR Screening     Status: None   Collection Time: 08/05/15  6:23 AM  Result Value Ref Range Status   MRSA by PCR NEGATIVE NEGATIVE Final    Comment:        The GeneXpert MRSA Assay (FDA approved for NASAL specimens only), is one component of a comprehensive MRSA  colonization surveillance program. It is not intended to diagnose MRSA infection nor to guide or monitor treatment for MRSA infections.          Radiology Studies: Dg Chest 2 View  08/04/2015  CLINICAL DATA:  Productive cough. EXAM: CHEST  2 VIEW COMPARISON:  Aug 13, 2014. FINDINGS: The heart size and mediastinal contours are within normal limits. Both lungs are clear. Old left rib fracture is noted. Atherosclerosis of thoracic aorta is noted. Anterior osteophyte formation is noted in lower thoracic spine. IMPRESSION: No active cardiopulmonary disease. Electronically Signed   By: Lupita Raider, M.D.   On: 08/04/2015 16:28        Scheduled Meds: . amiodarone  100 mg Oral Daily  . amitriptyline  75 mg Oral QHS  . amLODipine  5 mg Oral Daily  . cholecalciferol  2,000 Units Oral Daily  . enoxaparin (LOVENOX) injection  40 mg Subcutaneous Q24H  . hydrochlorothiazide  25 mg Oral Daily  . levofloxacin (LEVAQUIN) IV  250 mg Intravenous Q24H  . levothyroxine  50 mcg Oral QAC breakfast  . lisinopril  40 mg Oral Daily  . methylPREDNISolone (SOLU-MEDROL) injection  60 mg Intravenous Q6H  . mirtazapine  7.5 mg Oral QHS  . multivitamin with minerals  1 tablet Oral Daily  . PARoxetine  40 mg Oral Daily  . polyethylene glycol  17 g Oral BID  . saccharomyces boulardii  250 mg Oral BID  . senna-docusate  1 tablet Oral BID  . traZODone  100 mg Oral QHS   Continuous Infusions: . albuterol 10 mg/hr (08/04/15 1806)     LOS: 1 day    Time spent: 35 min    Jeralyn Bennett, MD [facility] Pager [phone]  If 7PM-7AM, please contact night-coverage [email or web address] Password [facility] 08/05/2015, 4:43 PM

## 2015-08-05 NOTE — H&P (Signed)
History and Physical    Paula Bird ZOX:[id number] DOB: [birth year] DOA: 08/04/2015  Referring Provider: Dr. Ranae Palms (EDP)  PCP: [name], MD  Outpatient Specialists: Dr. Mosetta Putt (hematology/oncology)   Patient coming from: SNF  Chief Complaint: Dyspnea, wheezing   HPI: Paula Bird is a 82 y.o. female with medical history significant for hypertension, hypothyroidism, depression, insomnia, paroxysmal atrial fibrillation, and chronic obstructive airways disease who presents from her SNF with 2 weeks of progressive dyspnea, cough, and wheezing. Patient reports pain in her usual state until approximately 2 weeks ago when she experienced some upper airway symptoms that she attributed to seasonal allergies. These symptoms soon gave way to dyspnea, cough productive of thick yellow sputum, and wheezing. Patient has a nebulizer at the SNF which she has been using periodically with minimal relief. She was evaluated by clinician at the SNF yesterday and started on Levaquin for suspected exacerbation in her lung disease. Her condition continued to worsen despite this therapy and she was sent into the ED for evaluation. Patient endorses some subjective fever earlier in the day, but denies chills or sweats. She endorses muscle soreness across the anterior chest that she attributes to coughing for the past 2 weeks. This chest discomfort is unchanged with exertion, but worse with cough or deep inspiration. There has been no unilateral leg swelling or tenderness, no abdominal pain, and no dysuria.  ED Course: Upon arrival to the ED, patient is found to be afebrile, saturating adequately on room air, and with vital signs stable. EKG features a sinus rhythm with first-degree AV block and T-wave flattening in the lateral leads. Chest x-ray is negative for acute cardiopulmonary disease. CMP features and elevated BUN and serum creatinine ratio and CBC is notable for thrombocytopenia with platelet count 105,000.  Troponin returns normal at 0.01. Patient exhibited significant respiratory distress and was treated with albuterol nebulizer 2. Her symptoms improved modestly with the nebulized albuterol and she was given 125 mg IV Solu-Medrol. Levaquin was also continued in the ED. Patient enjoyed some modest improvement with breathing treatments in the emergency department, but it remains significantly dyspneic with audible wheezing. She'll be admitted to the hospital for ongoing evaluation and management of dyspnea, cough, and wheeze suspected secondary to acute exacerbation and her chronic obstructive lung disease.  Review of Systems:  All other systems reviewed and apart from HPI, are negative.  Past Medical History  Diagnosis Date  . Hypertension   . Thyroid disease   . Vertigo     Past Surgical History  Procedure Laterality Date  . Abdominal hysterectomy    . Appendectomy    . Total hip arthroplasty Left   . Total knee arthroplasty Left      reports that she has never smoked. She does not have any smokeless tobacco history on file. She reports that she does not drink alcohol or use illicit drugs.  No Known Allergies  Family History  Problem Relation Age of Onset  . Hypertension Mother   . Hypertension Son   . Diabetes Neg Hx      Prior to Admission medications   Medication Sig Start Date End Date Taking? Authorizing Provider  acetaminophen (TYLENOL) 325 MG tablet Take 325 mg by mouth every 4 (four) hours as needed for mild pain, moderate pain, fever or headache.   Yes Historical Provider, MD  amiodarone (PACERONE) 200 MG tablet Take 100 mg by mouth daily.    Yes Historical Provider, MD  amitriptyline (ELAVIL) 75 MG tablet Take 75 mg  by mouth at bedtime. 07/12/15  Yes Historical Provider, MD  amLODipine (NORVASC) 5 MG tablet Take 5 mg by mouth daily.   Yes Historical Provider, MD  aspirin 325 MG tablet Take 325 mg by mouth daily.   Yes Historical Provider, MD  bisacodyl (DULCOLAX) 10 MG  suppository Place 10 mg rectally daily as needed for moderate constipation.   Yes Historical Provider, MD  cholecalciferol (VITAMIN D) 1000 UNITS tablet Take 2,000 Units by mouth daily.   Yes Historical Provider, MD  Cranberry 425 MG CAPS Take 425 mg by mouth 2 (two) times daily.   Yes Historical Provider, MD  diclofenac sodium (VOLTAREN) 1 % GEL Apply 1 application topically daily. 06/23/15  Yes Historical Provider, MD  GuaiFENesin 150 MG/15ML LIQD Take 1 Dose by mouth 4 (four) times daily as needed (cough).   Yes Historical Provider, MD  hydrochlorothiazide (HYDRODIURIL) 25 MG tablet Take 25 mg by mouth daily. 01/12/15  Yes Historical Provider, MD  levofloxacin (LEVAQUIN) 500 MG tablet Take 500 mg by mouth daily.   Yes Historical Provider, MD  levothyroxine (SYNTHROID, LEVOTHROID) 50 MCG tablet Take 50 mcg by mouth daily before breakfast.   Yes Historical Provider, MD  lisinopril (PRINIVIL,ZESTRIL) 40 MG tablet Take 40 mg by mouth daily. 08/04/15  Yes Historical Provider, MD  mirtazapine (REMERON) 7.5 MG tablet Take 7.5 mg by mouth at bedtime. 07/12/15  Yes Historical Provider, MD  Multiple Vitamins-Minerals (MULTIVITAMIN GUMMIES ADULT PO) Take 1 tablet by mouth daily.   Yes Historical Provider, MD  PARoxetine (PAXIL) 40 MG tablet Take 40 mg by mouth daily. 07/23/15  Yes Historical Provider, MD  polyethylene glycol (MIRALAX / GLYCOLAX) packet Take 17 g by mouth daily. Patient taking differently: Take 17 g by mouth 2 (two) times daily.  08/27/14  Yes Arthor CaptainAbigail Harris, PA-C  promethazine (PHENERGAN) 25 MG tablet Take 25 mg by mouth every 6 (six) hours as needed for nausea or vomiting.   Yes Historical Provider, MD  saccharomyces boulardii (FLORASTOR) 250 MG capsule Take 250 mg by mouth 2 (two) times daily.   Yes Historical Provider, MD  sennosides-docusate sodium (SENOKOT-S) 8.6-50 MG tablet Take 1 tablet by mouth 2 (two) times daily.   Yes Historical Provider, MD  SUMAtriptan (IMITREX) 25 MG tablet Take 25  mg by mouth daily as needed for migraine. May repeat in 2 hours if headache persists or recurs.   Yes Historical Provider, MD  traZODone (DESYREL) 100 MG tablet Take 100 mg by mouth at bedtime.   Yes Historical Provider, MD  acetaminophen-codeine (TYLENOL #3) 300-30 MG per tablet Take 1 tablet by mouth every 6 (six) hours as needed for moderate pain or severe pain. Patient not taking: Reported on 08/04/2015 08/27/14   Arthor CaptainAbigail Harris, PA-C  dicyclomine (BENTYL) 20 MG tablet Take 1 tablet (20 mg total) by mouth 2 (two) times daily. Patient not taking: Reported on 08/04/2015 08/27/14   Arthor CaptainAbigail Harris, PA-C    Physical Exam: Filed Vitals:   08/04/15 1526 08/04/15 1806 08/04/15 1817  BP: 145/65  119/96  Pulse: 80  80  Temp: 98.5 F (36.9 C)    TempSrc: Oral    Resp: 20  20  SpO2: 97% 95% 97%      Constitutional: In moderate respiratory distress with accessory muscle recruitment and dyspnea between sentences Eyes: PERTLA, lids and conjunctivae normal ENMT: Mucous membranes are moist. Posterior pharynx clear of any exudate or lesions.   Neck: normal, supple, no masses, no thyromegaly Respiratory: Accessory muscle use noted.  Coarse upper airways noises, audible wheezing Cardiovascular: S1 & S2 heard, regular rate and rhythm. No extremity edema. 2+ pedal pulses.    Abdomen: No distension, no tenderness, no masses palpated.  Bowel sounds normal.  Musculoskeletal: no clubbing / cyanosis. No joint deformity upper and lower extremities. Normal muscle tone.  Skin: no rashes, lesions, ulcers. No induration Neurologic: CN 2-12 grossly intact. Sensation intact, DTR normal. Strength 5/5 in all 4 limbs.  Psychiatric: Normal judgment and insight. Alert and oriented x 3. Normal mood.     Labs on Admission: I have personally reviewed following labs and imaging studies  CBC:  Recent Labs Lab 08/04/15 1645  WBC 4.9  NEUTROABS 3.1  HGB 12.2  HCT 38.1  MCV 81.9  PLT 105*   Basic Metabolic  Panel:  Recent Labs Lab 08/04/15 1645  NA 137  K 3.5  CL 99*  CO2 29  GLUCOSE 125*  BUN 24*  CREATININE 0.60  CALCIUM 8.9   GFR: CrCl cannot be calculated (Unknown ideal weight.). Liver Function Tests:  Recent Labs Lab 08/04/15 1645  AST 20  ALT 18  ALKPHOS 63  BILITOT 0.3  PROT 7.0  ALBUMIN 3.4*   No results for input(s): LIPASE, AMYLASE in the last 168 hours. No results for input(s): AMMONIA in the last 168 hours. Coagulation Profile: No results for input(s): INR, PROTIME in the last 168 hours. Cardiac Enzymes: No results for input(s): CKTOTAL, CKMB, CKMBINDEX, TROPONINI in the last 168 hours. BNP (last 3 results) No results for input(s): PROBNP in the last 8760 hours. HbA1C: No results for input(s): HGBA1C in the last 72 hours. CBG: No results for input(s): GLUCAP in the last 168 hours. Lipid Profile: No results for input(s): CHOL, HDL, LDLCALC, TRIG, CHOLHDL, LDLDIRECT in the last 72 hours. Thyroid Function Tests: No results for input(s): TSH, T4TOTAL, FREET4, T3FREE, THYROIDAB in the last 72 hours. Anemia Panel: No results for input(s): VITAMINB12, FOLATE, FERRITIN, TIBC, IRON, RETICCTPCT in the last 72 hours. Urine analysis:    Component Value Date/Time   COLORURINE YELLOW 08/13/2014 2256   APPEARANCEUR CLEAR 08/13/2014 2256   LABSPEC 1.016 08/13/2014 2256   PHURINE 6.5 08/13/2014 2256   GLUCOSEU NEGATIVE 08/13/2014 2256   HGBUR NEGATIVE 08/13/2014 2256   BILIRUBINUR NEGATIVE 08/13/2014 2256   KETONESUR NEGATIVE 08/13/2014 2256   PROTEINUR NEGATIVE 08/13/2014 2256   UROBILINOGEN 0.2 08/13/2014 2256   NITRITE NEGATIVE 08/13/2014 2256   LEUKOCYTESUR MODERATE* 08/13/2014 2256   Sepsis Labs: @LABRCNTIP (procalcitonin:4,lacticidven:4) )No results found for this or any previous visit (from the past 240 hour(s)).   Radiological Exams on Admission: Dg Chest 2 View  08/04/2015  CLINICAL DATA:  Productive cough. EXAM: CHEST  2 VIEW COMPARISON:  Aug 13, 2014. FINDINGS: The heart size and mediastinal contours are within normal limits. Both lungs are clear. Old left rib fracture is noted. Atherosclerosis of thoracic aorta is noted. Anterior osteophyte formation is noted in lower thoracic spine. IMPRESSION: No active cardiopulmonary disease. Electronically Signed   By: Lupita Raider, M.D.   On: 08/04/2015 16:28    EKG: Independently reviewed. Sinus rhythm, 1st degree AV block, T-wave flattening in lateral leads  Assessment/Plan  1. Acute exacerbation in chronic obstructive airways disease  - Does not carry formal COPD diagnosis but reports exacerbations ~once yearly that respond well to breathing treatments - Denies smoking history, significant 2nd hand smoke, or occupational exposures  - Treated with Levaquin, 125 mg IV Solu-Medrol, and continuous neb in ED  - Continue  Levaquin, Solu-Medrol 60 mg q6h, and DuoNeb q4h prn  - CXR is without infiltrate  - Supplemental O2 prn sat <92%    2. Thrombocytopenia  - Platelet count 105,000 on admission  - Has history of intermittent thrombocytopenia and had been referred to hematologist who attributed this to lab errors  - No sign of bleeding  - Hold ASA  - Repeat CBC periodically    3. Paroxysmal atrial fibrillation - In sinus rhythm on admission  - CHADS-VASc is 56 (age x2, gender, HTN)  - Not anticoagulated, taking ASA 325 qD  - Rhythm-controlled with amiodarone   4. Hypertension - At goal currently  - Managed with Norvasc, lisinopril, HCTZ at home  - Continue home medications at current dose    5. Hypothyroidism  - Appears stable  - Continue current-dose Synthroid    6. Depression, insomnia  - Stable, pt denies SI, HI, or hallucinations  - Continue Remeron, Elavil, Paxil, trazodone    DVT prophylaxis: sq Lovenox Code Status: DNR  Family Communication: Discussed with patient  Disposition Plan: Observe on med/surg unit Consults called: Respiratory therapy  Admission status:  Observation   Briscoe Deutscher MD [facility] Pager [phone]  If 7PM-7AM, please contact night-coverage [email or web address] Password [email or web address]  08/04/2015, 7:06 PM

## 2015-08-05 NOTE — Care Management Obs Status (Signed)
MEDICARE OBSERVATION STATUS NOTIFICATION   Patient Details  Name: Paula Bird MRN: [id number] Date of Birth: 11/29/1925   Medicare Observation Status Notification Given:  Yes    Geni BersMcGibboney, Thurman Sarver, RN 08/05/2015, 3:28 PM

## 2015-08-06 LAB — BASIC METABOLIC PANEL
Anion gap: 8 (ref 5–15)
BUN: 27 mg/dL — ABNORMAL HIGH (ref 6–20)
CHLORIDE: 105 mmol/L (ref 101–111)
CO2: 27 mmol/L (ref 22–32)
Calcium: 8.9 mg/dL (ref 8.9–10.3)
Creatinine, Ser: 0.45 mg/dL (ref 0.44–1.00)
GFR calc non Af Amer: >60 mL/min (ref >60)
Glucose, Bld: 164 mg/dL — ABNORMAL HIGH (ref 65–99)
POTASSIUM: 3.8 mmol/L (ref 3.5–5.1)
SODIUM: 140 mmol/L (ref 135–145)

## 2015-08-06 LAB — CBC
HEMATOCRIT: 35.2 % — AB (ref 36.0–46.0)
Hemoglobin: 11 g/dL — ABNORMAL LOW (ref 12.0–15.0)
MCH: 25.9 pg — ABNORMAL LOW (ref 26.0–34.0)
MCHC: 31.3 g/dL (ref 30.0–36.0)
MCV: 82.8 fL (ref 78.0–100.0)
PLATELETS: 122 10*3/uL — AB (ref 150–400)
RBC: 4.25 MIL/uL (ref 3.87–5.11)
RDW: 15.2 % (ref 11.5–15.5)
WBC: 11.6 10*3/uL — AB (ref 4.0–10.5)

## 2015-08-06 LAB — PROCALCITONIN: Procalcitonin: 0.21 ng/mL

## 2015-08-06 MED ORDER — IPRATROPIUM-ALBUTEROL 0.5-2.5 (3) MG/3ML IN SOLN: 3.0000 mL | RESPIRATORY_TRACT | Status: DC | PRN

## 2015-08-06 MED ORDER — PREDNISONE 10 MG (21) PO TBPK: ORAL_TABLET | ORAL | Status: DC

## 2015-08-06 NOTE — Progress Notes (Addendum)
[facility]. CSW spoke with Alida at ALF and have faxed dc summary and RX (no FL2 needed) to her. RNCM contacted for DME (nebulizer).   Patient agreeable to this plan- plan transfer via son [name](Guy). Reece LevyJanet Aerianna Losey, MSW, LCSW [phone]

## 2015-08-06 NOTE — Discharge Summary (Signed)
Physician Discharge Summary  Paula Bird:[id number] DOB: [birth year] DOA: 08/04/2015  PCP: [name], MD  Admit date: 08/04/2015 Discharge date: 08/06/2015  Time spent: 35 minutes  Recommendations for Outpatient Follow-up:   1. Please follow-up on respiratory status, she was admitted for COPD exacerbation, discharged on a steroid taper 2. She was discharged to her skilled nursing facility   Discharge Diagnoses:  Principal Problem:   Obstructive chronic bronchitis with acute exacerbation (HCC) Active Problems:   Hypertension   Depression   Insomnia   Thrombocytopenia (HCC)   Paroxysmal atrial fibrillation (HCC)   Acute exacerbation of chronic obstructive bronchitis (HCC)   Hypothyroidism   Discharge Condition: Stable  Diet recommendation: Heart healthy diet  Filed Weights   08/04/15 2015  Weight: 63.5 kg (139 lb 15.9 oz)    History of present illness:  Paula Bird is a 82 y.o. female with medical history significant for hypertension, hypothyroidism, depression, insomnia, paroxysmal atrial fibrillation, and chronic obstructive airways disease who presents from her SNF with 2 weeks of progressive dyspnea, cough, and wheezing. Patient reports pain in her usual state until approximately 2 weeks ago when she experienced some upper airway symptoms that she attributed to seasonal allergies. These symptoms soon gave way to dyspnea, cough productive of thick yellow sputum, and wheezing. Patient has a nebulizer at the SNF which she has been using periodically with minimal relief. She was evaluated by clinician at the SNF yesterday and started on Levaquin for suspected exacerbation in her lung disease. Her condition continued to worsen despite this therapy and she was sent into the ED for evaluation. Patient endorses some subjective fever earlier in the day, but denies chills or sweats. She endorses muscle soreness across the anterior chest that she attributes to coughing for the  past 2 weeks. This chest discomfort is unchanged with exertion, but worse with cough or deep inspiration. There has been no unilateral leg swelling or tenderness, no abdominal pain, and no dysuria.  Hospital Course:  [name] is a pleasant 82 year old female with multiple comorbidities including chronic obstructive pulmonary disease to presented as a transfer from her skilled nursing facility to the emergency room on 08/04/2015 presented with complaints of cough, shortness of breath and wheezing. Symptoms felt to be secondary to COPD exacerbation as she was started on systemic steroids, and a religion treatments, empiric antibiotic therapy.  1. COPD exacerbation.  -Mrs. Grafton presenting to the emergency room with complaints of cough associate with wheezing, shortness of breath, symptoms felt to be secondary to COPD exacerbation. She reported having history of secondhand exposure. -Chest x-ray did not reveal acute cardiopulmonary disease -She was discharged on a prednisone taper and albuterol nebulizer treatments as needed  2. History of paroxysmal atrial fibrillation -She is not anticoagulated, has CHADSVasc score of 4 -Continue aspirin therapy and amiodarone 100 mg by mouth daily  3. Hypertension -Blood pressure stable -Continue hydrochlorothiazide 25 mg by mouth daily, lisinopril 40 mg by mouth daily and amlodipine 5 mg by mouth daily  4. Hypothyroidism -Continue Synthroid 50 g by mouth daily  5. Hypokalemia -Resolved with potassium replacement having a potassium of 3.8 on day of discharge   Discharge Exam: Filed Vitals:   08/05/15 2106 08/06/15 0526  BP: 115/82 122/57  Pulse: 84 68  Temp: 98.6 F (37 C) 97.7 F (36.5 C)  Resp: 18 18    General: Looks better, breathing comfortably on room air, asking to go home today. Cardiovascular: Regular rate and rhythm normal S1-S2 Respiratory: Interim improvement  to lung exam, good air movement, resolving wheezes. Abdomen:  Soft nontender nondistended  Discharge Instructions   Discharge Instructions    Call MD for:  difficulty breathing, headache or visual disturbances    Complete by:  As directed      Call MD for:  extreme fatigue    Complete by:  As directed      Call MD for:  hives    Complete by:  As directed      Call MD for:  persistant dizziness or light-headedness    Complete by:  As directed      Call MD for:  persistant nausea and vomiting    Complete by:  As directed      Call MD for:  redness, tenderness, or signs of infection (pain, swelling, redness, odor or green/yellow discharge around incision site)    Complete by:  As directed      Call MD for:  severe uncontrolled pain    Complete by:  As directed      Call MD for:  temperature >100.4    Complete by:  As directed      Call MD for:    Complete by:  As directed      Diet - low sodium heart healthy    Complete by:  As directed      Increase activity slowly    Complete by:  As directed           Current Discharge Medication List    START taking these medications   Details  ipratropium-albuterol (DUONEB) 0.5-2.5 (3) MG/3ML SOLN Take 3 mLs by nebulization every 4 (four) hours as needed. Qty: 360 mL, Refills: 1    predniSONE (STERAPRED UNI-PAK 21 TAB) 10 MG (21) TBPK tablet Take 6-5-4-3-2-1 tablets by mouth daily till gone. Qty: 21 tablet, Refills: 0      CONTINUE these medications which have NOT CHANGED   Details  acetaminophen (TYLENOL) 325 MG tablet Take 325 mg by mouth every 4 (four) hours as needed for mild pain, moderate pain, fever or headache.    amiodarone (PACERONE) 200 MG tablet Take 100 mg by mouth daily.     amitriptyline (ELAVIL) 75 MG tablet Take 75 mg by mouth at bedtime. Refills: 10    amLODipine (NORVASC) 5 MG tablet Take 5 mg by mouth daily.    aspirin 325 MG tablet Take 325 mg by mouth daily.    bisacodyl (DULCOLAX) 10 MG suppository Place 10 mg rectally daily as needed for moderate constipation.     cholecalciferol (VITAMIN D) 1000 UNITS tablet Take 2,000 Units by mouth daily.    Cranberry 425 MG CAPS Take 425 mg by mouth 2 (two) times daily.    diclofenac sodium (VOLTAREN) 1 % GEL Apply 1 application topically daily. Refills: 10    GuaiFENesin 150 MG/15ML LIQD Take 1 Dose by mouth 4 (four) times daily as needed (cough).    hydrochlorothiazide (HYDRODIURIL) 25 MG tablet Take 25 mg by mouth daily. Refills: 10   Associated Diagnoses: Thrombocytopenia (HCC); Anemia, unspecified anemia type    levothyroxine (SYNTHROID, LEVOTHROID) 50 MCG tablet Take 50 mcg by mouth daily before breakfast.    lisinopril (PRINIVIL,ZESTRIL) 40 MG tablet Take 40 mg by mouth daily. Refills: 10    mirtazapine (REMERON) 7.5 MG tablet Take 7.5 mg by mouth at bedtime. Refills: 10    Multiple Vitamins-Minerals (MULTIVITAMIN GUMMIES ADULT PO) Take 1 tablet by mouth daily.    PARoxetine (PAXIL) 40 MG tablet Take  40 mg by mouth daily. Refills: 3    polyethylene glycol (MIRALAX / GLYCOLAX) packet Take 17 g by mouth daily. Qty: 14 each, Refills: 12    promethazine (PHENERGAN) 25 MG tablet Take 25 mg by mouth every 6 (six) hours as needed for nausea or vomiting.    saccharomyces boulardii (FLORASTOR) 250 MG capsule Take 250 mg by mouth 2 (two) times daily.    sennosides-docusate sodium (SENOKOT-S) 8.6-50 MG tablet Take 1 tablet by mouth 2 (two) times daily.    SUMAtriptan (IMITREX) 25 MG tablet Take 25 mg by mouth daily as needed for migraine. May repeat in 2 hours if headache persists or recurs.    traZODone (DESYREL) 100 MG tablet Take 100 mg by mouth at bedtime.    acetaminophen-codeine (TYLENOL #3) 300-30 MG per tablet Take 1 tablet by mouth every 6 (six) hours as needed for moderate pain or severe pain. Qty: 15 tablet, Refills: 0    dicyclomine (BENTYL) 20 MG tablet Take 1 tablet (20 mg total) by mouth 2 (two) times daily. Qty: 20 tablet, Refills: 0      STOP taking these medications      levofloxacin (LEVAQUIN) 500 MG tablet        No Known Allergies Follow-up Information    Follow up with [name], MD In 1 week.   Specialty:  Family Medicine   Contact information:   [address] [place] [address] [phone]        The results of significant diagnostics from this hospitalization (including imaging, microbiology, ancillary and laboratory) are listed below for reference.    Significant Diagnostic Studies: Dg Chest 2 View  08/04/2015  CLINICAL DATA:  Productive cough. EXAM: CHEST  2 VIEW COMPARISON:  Aug 13, 2014. FINDINGS: The heart size and mediastinal contours are within normal limits. Both lungs are clear. Old left rib fracture is noted. Atherosclerosis of thoracic aorta is noted. Anterior osteophyte formation is noted in lower thoracic spine. IMPRESSION: No active cardiopulmonary disease. Electronically Signed   By: Lupita Raider, M.D.   On: 08/04/2015 16:28    Microbiology: Recent Results (from the past 240 hour(s))  MRSA PCR Screening     Status: None   Collection Time: 08/05/15  6:23 AM  Result Value Ref Range Status   MRSA by PCR NEGATIVE NEGATIVE Final    Comment:        The GeneXpert MRSA Assay (FDA approved for NASAL specimens only), is one component of a comprehensive MRSA colonization surveillance program. It is not intended to diagnose MRSA infection nor to guide or monitor treatment for MRSA infections.      Labs: Basic Metabolic Panel:  Recent Labs Lab 08/04/15 1645 08/05/15 0520 08/06/15 0519  NA 137 141 140  K 3.5 3.4* 3.8  CL 99* 105 105  CO2 GLUCOSE 125* 199* 164*  BUN 24* 30* 27*  CREATININE 0.60 0.55 0.45  CALCIUM 8.9 8.8* 8.9   Liver Function Tests:  Recent Labs Lab 08/04/15 1645  AST 20  ALT 18  ALKPHOS 63  BILITOT 0.3  PROT 7.0  ALBUMIN 3.4*   No results for input(s): LIPASE, AMYLASE in the last 168 hours. No results for input(s): AMMONIA in the last 168  hours. CBC:  Recent Labs Lab 08/04/15 1645 08/05/15 0520 08/06/15 0519  WBC 4.9 4.7 11.6*  NEUTROABS 3.1  --   --   HGB 12.2 11.0* 11.0*  HCT 38.1 35.2* 35.2*  MCV 81.9 83.6 82.8  PLT 105* PLATELET CLUMPS NOTED ON SMEAR, UNABLE TO ESTIMATE 122*   Cardiac Enzymes: No results for input(s): CKTOTAL, CKMB, CKMBINDEX, TROPONINI in the last 168 hours. BNP: BNP (last 3 results) No results for input(s): BNP in the last 8760 hours.  ProBNP (last 3 results) No results for input(s): PROBNP in the last 8760 hours.  CBG:  Recent Labs Lab 08/05/15 0736  GLUCAP 153*       Signed:  Jeralyn BennettZAMORA, Harold Moncus MD.  [facility] 08/06/2015, 8:29 AM

## 2015-08-06 NOTE — Care Management Note (Signed)
Case Management Note  Patient Details  Name: Paula Bird MRN: [id number] Date of Birth: Jul 31, 1925  Subjective/Objective:    Obstructive chronic bronchitis with acute exacerbation                 Action/Plan: Discharge Planning: AVS reviewed:  Scheduled dc to ALF. CSW following for SNF. Contacted [facility] DME rep for neb machine for home. Son will transport pt back to ALF.    Expected Discharge Date:  08/06/2015             Expected Discharge Plan:  Assisted Living / Rest Home  In-House Referral:  Clinical Social Work  Discharge planning Services  CM Consult  Post Acute Care Choice:  NA Choice offered to:  NA  DME Arranged:  Nebulizer/meds DME Agency:  [facility]  HH Arranged:  NA HH Agency:  NA  Status of Service:  Completed, signed off  Medicare Important Message Given:    Date Medicare IM Given:    Medicare IM give by:    Date Additional Medicare IM Given:    Additional Medicare Important Message give by:     If discussed at Long Length of Stay Meetings, dates discussed:    Additional Comments:  Elliot CousinShavis, Amire Gossen Ellen, RN 08/06/2015, 11:12 AM

## 2015-08-08 ENCOUNTER — Inpatient Hospital Stay (HOSPITAL_COMMUNITY)
Admission: EM | Admit: 2015-08-08 | Discharge: 2015-08-16 | DRG: 190 | Disposition: A | Discharge status: Skilled Nursing Facility | Payer: Medicare Other | Attending: Internal Medicine | Admitting: Internal Medicine

## 2015-08-08 ENCOUNTER — Encounter (HOSPITAL_COMMUNITY): Payer: Self-pay | Admitting: Emergency Medicine

## 2015-08-08 ENCOUNTER — Emergency Department (HOSPITAL_COMMUNITY)
Admission: EM | Admit: 2015-08-08 | Discharge: 2015-08-08 | Disposition: A | Discharge status: Home / Self Care | Payer: Medicare Other | Source: Home / Self Care | Attending: Emergency Medicine | Admitting: Emergency Medicine

## 2015-08-08 ENCOUNTER — Emergency Department (HOSPITAL_COMMUNITY): Payer: Medicare Other

## 2015-08-08 DIAGNOSIS — R0603 Acute respiratory distress: Secondary | ICD-10-CM

## 2015-08-08 DIAGNOSIS — R05 Cough: Secondary | ICD-10-CM

## 2015-08-08 DIAGNOSIS — J189 Pneumonia, unspecified organism: Secondary | ICD-10-CM | POA: Diagnosis present

## 2015-08-08 DIAGNOSIS — K224 Dyskinesia of esophagus: Secondary | ICD-10-CM | POA: Diagnosis present

## 2015-08-08 DIAGNOSIS — J690 Pneumonitis due to inhalation of food and vomit: Secondary | ICD-10-CM | POA: Diagnosis present

## 2015-08-08 DIAGNOSIS — I10 Essential (primary) hypertension: Secondary | ICD-10-CM | POA: Diagnosis present

## 2015-08-08 DIAGNOSIS — F419 Anxiety disorder, unspecified: Secondary | ICD-10-CM | POA: Diagnosis present

## 2015-08-08 DIAGNOSIS — E038 Other specified hypothyroidism: Secondary | ICD-10-CM | POA: Diagnosis not present

## 2015-08-08 DIAGNOSIS — Z792 Long term (current) use of antibiotics: Secondary | ICD-10-CM

## 2015-08-08 DIAGNOSIS — J440 Chronic obstructive pulmonary disease with acute lower respiratory infection: Secondary | ICD-10-CM | POA: Diagnosis present

## 2015-08-08 DIAGNOSIS — Z79899 Other long term (current) drug therapy: Secondary | ICD-10-CM | POA: Insufficient documentation

## 2015-08-08 DIAGNOSIS — J40 Bronchitis, not specified as acute or chronic: Secondary | ICD-10-CM | POA: Insufficient documentation

## 2015-08-08 DIAGNOSIS — E039 Hypothyroidism, unspecified: Secondary | ICD-10-CM | POA: Diagnosis present

## 2015-08-08 DIAGNOSIS — R627 Adult failure to thrive: Secondary | ICD-10-CM | POA: Diagnosis present

## 2015-08-08 DIAGNOSIS — I480 Paroxysmal atrial fibrillation: Secondary | ICD-10-CM | POA: Diagnosis present

## 2015-08-08 DIAGNOSIS — R059 Cough, unspecified: Secondary | ICD-10-CM

## 2015-08-08 DIAGNOSIS — J9811 Atelectasis: Secondary | ICD-10-CM | POA: Diagnosis present

## 2015-08-08 DIAGNOSIS — R0902 Hypoxemia: Secondary | ICD-10-CM

## 2015-08-08 DIAGNOSIS — R0602 Shortness of breath: Secondary | ICD-10-CM | POA: Diagnosis present

## 2015-08-08 DIAGNOSIS — Z9181 History of falling: Secondary | ICD-10-CM

## 2015-08-08 DIAGNOSIS — Z96642 Presence of left artificial hip joint: Secondary | ICD-10-CM | POA: Diagnosis present

## 2015-08-08 DIAGNOSIS — Z96652 Presence of left artificial knee joint: Secondary | ICD-10-CM

## 2015-08-08 DIAGNOSIS — Z7982 Long term (current) use of aspirin: Secondary | ICD-10-CM

## 2015-08-08 DIAGNOSIS — J441 Chronic obstructive pulmonary disease with (acute) exacerbation: Secondary | ICD-10-CM | POA: Diagnosis present

## 2015-08-08 DIAGNOSIS — K219 Gastro-esophageal reflux disease without esophagitis: Secondary | ICD-10-CM | POA: Diagnosis present

## 2015-08-08 DIAGNOSIS — Z8249 Family history of ischemic heart disease and other diseases of the circulatory system: Secondary | ICD-10-CM

## 2015-08-08 DIAGNOSIS — D696 Thrombocytopenia, unspecified: Secondary | ICD-10-CM

## 2015-08-08 DIAGNOSIS — R918 Other nonspecific abnormal finding of lung field: Secondary | ICD-10-CM

## 2015-08-08 DIAGNOSIS — Z66 Do not resuscitate: Secondary | ICD-10-CM | POA: Diagnosis present

## 2015-08-08 DIAGNOSIS — Y95 Nosocomial condition: Secondary | ICD-10-CM | POA: Diagnosis present

## 2015-08-08 DIAGNOSIS — J9601 Acute respiratory failure with hypoxia: Secondary | ICD-10-CM | POA: Diagnosis present

## 2015-08-08 DIAGNOSIS — D649 Anemia, unspecified: Secondary | ICD-10-CM

## 2015-08-08 DIAGNOSIS — Z7952 Long term (current) use of systemic steroids: Secondary | ICD-10-CM | POA: Insufficient documentation

## 2015-08-08 DIAGNOSIS — R1310 Dysphagia, unspecified: Secondary | ICD-10-CM

## 2015-08-08 LAB — BASIC METABOLIC PANEL
ANION GAP: 6 (ref 5–15)
BUN: 26 mg/dL — ABNORMAL HIGH (ref 6–20)
CALCIUM: 8.6 mg/dL — AB (ref 8.9–10.3)
CO2: 31 mmol/L (ref 22–32)
CREATININE: 0.58 mg/dL (ref 0.44–1.00)
Chloride: 101 mmol/L (ref 101–111)
Glucose, Bld: 116 mg/dL — ABNORMAL HIGH (ref 65–99)
Potassium: 3.6 mmol/L (ref 3.5–5.1)
SODIUM: 138 mmol/L (ref 135–145)

## 2015-08-08 LAB — CBC
HCT: 37.4 % (ref 36.0–46.0)
HEMOGLOBIN: 11.8 g/dL — AB (ref 12.0–15.0)
MCH: 26.4 pg (ref 26.0–34.0)
MCHC: 31.6 g/dL (ref 30.0–36.0)
MCV: 83.7 fL (ref 78.0–100.0)
PLATELETS: 131 10*3/uL — AB (ref 150–400)
RBC: 4.47 MIL/uL (ref 3.87–5.11)
RDW: 15.4 % (ref 11.5–15.5)
WBC: 7.7 10*3/uL (ref 4.0–10.5)

## 2015-08-08 LAB — LACTIC ACID, PLASMA: LACTIC ACID, VENOUS: 2.1 mmol/L — AB (ref 0.5–2.0)

## 2015-08-08 LAB — GLUCOSE, CAPILLARY: Glucose-Capillary: 148 mg/dL — ABNORMAL HIGH (ref 65–99)

## 2015-08-08 LAB — TROPONIN I: Troponin I: <0.03 ng/mL (ref <0.031)

## 2015-08-08 MED ORDER — CETYLPYRIDINIUM CHLORIDE 0.05 % MT LIQD
7.0000 mL | Freq: Two times a day (BID) | OROMUCOSAL | Status: DC
  Administered 2015-08-08 – 2015-08-14 (×11): 7 mL via OROMUCOSAL

## 2015-08-08 MED ORDER — IPRATROPIUM-ALBUTEROL 0.5-2.5 (3) MG/3ML IN SOLN: 3.0000 mL | RESPIRATORY_TRACT | Status: DC

## 2015-08-08 MED ORDER — VANCOMYCIN HCL 500 MG IV SOLR
500.0000 mg | Freq: Two times a day (BID) | INTRAVENOUS | Status: DC
  Administered 2015-08-08 – 2015-08-10 (×5): 500 mg via INTRAVENOUS
  Filled 2015-08-08 (×5): qty 500

## 2015-08-08 MED ORDER — LEVOFLOXACIN 500 MG PO TABS: 500.0000 mg | ORAL_TABLET | Freq: Every day | ORAL | Status: DC

## 2015-08-08 MED ORDER — IPRATROPIUM BROMIDE 0.02 % IN SOLN
0.5000 mg | Freq: Once | RESPIRATORY_TRACT | Status: AC
  Administered 2015-08-08: 0.5 mg via RESPIRATORY_TRACT
  Filled 2015-08-08: qty 2.5

## 2015-08-08 MED ORDER — ASPIRIN 325 MG PO TABS
325.0000 mg | ORAL_TABLET | Freq: Every day | ORAL | Status: DC
  Administered 2015-08-08 – 2015-08-16 (×9): 325 mg via ORAL
  Filled 2015-08-08 (×9): qty 1

## 2015-08-08 MED ORDER — ENOXAPARIN SODIUM 40 MG/0.4ML ~~LOC~~ SOLN
40.0000 mg | SUBCUTANEOUS | Status: DC
  Administered 2015-08-08 – 2015-08-15 (×8): 40 mg via SUBCUTANEOUS
  Filled 2015-08-08 (×8): qty 0.4

## 2015-08-08 MED ORDER — AMLODIPINE BESYLATE 5 MG PO TABS
5.0000 mg | ORAL_TABLET | Freq: Every day | ORAL | Status: DC
Start: 2015-08-08 — End: 2015-08-14
  Administered 2015-08-08 – 2015-08-14 (×7): 5 mg via ORAL
  Filled 2015-08-08 (×7): qty 1

## 2015-08-08 MED ORDER — CHLORHEXIDINE GLUCONATE 0.12 % MT SOLN
15.0000 mL | Freq: Two times a day (BID) | OROMUCOSAL | Status: DC
  Administered 2015-08-08 – 2015-08-16 (×17): 15 mL via OROMUCOSAL
  Filled 2015-08-08 (×14): qty 15

## 2015-08-08 MED ORDER — IPRATROPIUM-ALBUTEROL 0.5-2.5 (3) MG/3ML IN SOLN
3.0000 mL | Freq: Three times a day (TID) | RESPIRATORY_TRACT | Status: DC
  Administered 2015-08-08 – 2015-08-09 (×2): 3 mL via RESPIRATORY_TRACT
  Filled 2015-08-08 (×5): qty 3

## 2015-08-08 MED ORDER — AMIODARONE HCL 200 MG PO TABS
200.0000 mg | ORAL_TABLET | Freq: Every day | ORAL | Status: DC
  Administered 2015-08-08 – 2015-08-16 (×9): 200 mg via ORAL
  Filled 2015-08-08 (×9): qty 1

## 2015-08-08 MED ORDER — SODIUM CHLORIDE 0.9 % IV SOLN
INTRAVENOUS | Status: AC
  Administered 2015-08-08: 14:00:00 via INTRAVENOUS

## 2015-08-08 MED ORDER — SUMATRIPTAN SUCCINATE 25 MG PO TABS: 25.0000 mg | ORAL_TABLET | Freq: Every day | ORAL | Status: DC | PRN

## 2015-08-08 MED ORDER — AMITRIPTYLINE HCL 75 MG PO TABS
75.0000 mg | ORAL_TABLET | Freq: Every day | ORAL | Status: DC
  Administered 2015-08-08 – 2015-08-15 (×8): 75 mg via ORAL
  Filled 2015-08-08 (×9): qty 1

## 2015-08-08 MED ORDER — MIRTAZAPINE 7.5 MG PO TABS
7.5000 mg | ORAL_TABLET | Freq: Every day | ORAL | Status: DC
  Administered 2015-08-08 – 2015-08-15 (×8): 7.5 mg via ORAL
  Filled 2015-08-08 (×8): qty 1

## 2015-08-08 MED ORDER — METHYLPREDNISOLONE SODIUM SUCC 125 MG IJ SOLR
60.0000 mg | Freq: Four times a day (QID) | INTRAMUSCULAR | Status: DC
  Administered 2015-08-08 – 2015-08-12 (×18): 60 mg via INTRAVENOUS
  Filled 2015-08-08: qty 0.96
  Filled 2015-08-08 (×4): qty 2
  Filled 2015-08-08: qty 0.96
  Filled 2015-08-08 (×5): qty 2
  Filled 2015-08-08 (×2): qty 0.96
  Filled 2015-08-08 (×4): qty 2
  Filled 2015-08-08: qty 0.96
  Filled 2015-08-08: qty 2
  Filled 2015-08-08: qty 0.96

## 2015-08-08 MED ORDER — LEVOTHYROXINE SODIUM 50 MCG PO TABS
50.0000 ug | ORAL_TABLET | Freq: Every day | ORAL | Status: DC
  Administered 2015-08-09 – 2015-08-16 (×8): 50 ug via ORAL
  Filled 2015-08-08 (×8): qty 1

## 2015-08-08 MED ORDER — METHYLPREDNISOLONE SODIUM SUCC 125 MG IJ SOLR
125.0000 mg | Freq: Once | INTRAMUSCULAR | Status: AC
  Administered 2015-08-08: 125 mg via INTRAVENOUS
  Filled 2015-08-08: qty 2

## 2015-08-08 MED ORDER — DEXTROSE 5 % IV SOLN
1.0000 g | Freq: Two times a day (BID) | INTRAVENOUS | Status: DC
  Administered 2015-08-08 – 2015-08-13 (×10): 1 g via INTRAVENOUS
  Filled 2015-08-08 (×11): qty 1

## 2015-08-08 MED ORDER — ALBUTEROL SULFATE (2.5 MG/3ML) 0.083% IN NEBU
5.0000 mg | INHALATION_SOLUTION | Freq: Once | RESPIRATORY_TRACT | Status: AC
  Administered 2015-08-08: 5 mg via RESPIRATORY_TRACT
  Filled 2015-08-08: qty 6

## 2015-08-08 MED ORDER — LEVOFLOXACIN 500 MG PO TABS
500.0000 mg | ORAL_TABLET | Freq: Once | ORAL | Status: AC
  Administered 2015-08-08: 500 mg via ORAL
  Filled 2015-08-08: qty 1

## 2015-08-08 MED ORDER — TRAZODONE HCL 100 MG PO TABS
100.0000 mg | ORAL_TABLET | Freq: Every day | ORAL | Status: DC
  Administered 2015-08-08 – 2015-08-15 (×8): 100 mg via ORAL
  Filled 2015-08-08 (×8): qty 1

## 2015-08-08 MED ORDER — BISACODYL 10 MG RE SUPP: 10.0000 mg | Freq: Every day | RECTAL | Status: DC | PRN

## 2015-08-08 MED ORDER — PAROXETINE HCL 20 MG PO TABS
40.0000 mg | ORAL_TABLET | Freq: Every day | ORAL | Status: DC
  Administered 2015-08-08 – 2015-08-15 (×8): 40 mg via ORAL
  Filled 2015-08-08 (×8): qty 2

## 2015-08-08 MED ORDER — SACCHAROMYCES BOULARDII 250 MG PO CAPS
250.0000 mg | ORAL_CAPSULE | Freq: Two times a day (BID) | ORAL | Status: DC
  Administered 2015-08-08 – 2015-08-16 (×17): 250 mg via ORAL
  Filled 2015-08-08 (×17): qty 1

## 2015-08-08 MED ORDER — SENNOSIDES-DOCUSATE SODIUM 8.6-50 MG PO TABS
1.0000 | ORAL_TABLET | Freq: Two times a day (BID) | ORAL | Status: DC
  Administered 2015-08-08 – 2015-08-12 (×5): 1 via ORAL
  Filled 2015-08-08 (×11): qty 1

## 2015-08-08 NOTE — ED Notes (Signed)
Bed: [id number] Expected date:  Expected time:  Means of arrival:  Comments: EMS pt needs o2

## 2015-08-08 NOTE — Discharge Instructions (Signed)
Finish the Prednisone prescription Use albuterol 2 puffs every 4 hours as needed for difficulty breathing You need oxygen 2L for the next week when oxygen is below 92% Xray shows a possible early pneuomonia - take Levaquin daily for 7 days Return to the ER for worsening symtpoms.

## 2015-08-08 NOTE — Progress Notes (Signed)
Updated Paula Bird at [facility] of pt evaluation for admission pending

## 2015-08-08 NOTE — ED Notes (Signed)
[facility] called  

## 2015-08-08 NOTE — ED Provider Notes (Signed)
CSN: [id number]     Arrival date & time 08/08/15  0005 History  By signing my name below, I, [facility], attest that this documentation has been prepared under the direction and in the presence of Eber Hong, MD. Electronically Signed: Randell Patient, ED Scribe. 08/08/2015. 2:44 AM.   Chief Complaint  Patient presents with  . Respiratory Distress   The history is provided by the patient. No language interpreter was used.   HPI Comments: Paula Bird is a 82 y.o. female brought in by EMS from [facility] who presents to the Emergency Department complaining of constant, gradually worsening, moderate SOB ongoing for the past week, worse earlier tonight. Pt states that she was diagnosed with acute bronchitis 4 days ago in the [facility] ED where she was admitted to [facility] for 2 days. She was discharged home 2 days ago with prescription medications which she has taken without relief of her symptoms. She notes that she was lying down to sleep earlier tonight when her SOB became significantly worse. She reports a productive cough but notes that the SOB is worse in severity. Denies any other symptoms currently.  She was sent here for evaluation and to determine the need for ongoing oxygen therapy as she did have some borderline hypoxia tonight  Past Medical History  Diagnosis Date  . Hypertension   . Thyroid disease   . Vertigo    Past Surgical History  Procedure Laterality Date  . Abdominal hysterectomy    . Appendectomy    . Total hip arthroplasty Left   . Total knee arthroplasty Left    Family History  Problem Relation Age of Onset  . Hypertension Mother   . Hypertension Son   . Diabetes Neg Hx    Social History  Substance Use Topics  . Smoking status: Never Smoker   . Smokeless tobacco: None  . Alcohol Use: No   OB History    No data available     Review of Systems  Respiratory: Positive for cough and shortness of breath.   All other systems reviewed and are  negative.  Allergies  Review of patient's allergies indicates no known allergies.  Home Medications   Prior to Admission medications   Medication Sig Start Date End Date Taking? Authorizing Provider  amiodarone (PACERONE) 200 MG tablet Take 100 mg by mouth daily.    Yes Historical Provider, MD  amitriptyline (ELAVIL) 75 MG tablet Take 75 mg by mouth at bedtime. 07/12/15  Yes Historical Provider, MD  amLODipine (NORVASC) 5 MG tablet Take 5 mg by mouth daily.   Yes Historical Provider, MD  aspirin 325 MG tablet Take 325 mg by mouth daily.   Yes Historical Provider, MD  cholecalciferol (VITAMIN D) 1000 UNITS tablet Take 2,000 Units by mouth daily.   Yes Historical Provider, MD  Cranberry 425 MG CAPS Take 425 mg by mouth 2 (two) times daily.   Yes Historical Provider, MD  diclofenac sodium (VOLTAREN) 1 % GEL Apply 1 application topically daily. 06/23/15  Yes Historical Provider, MD  GuaiFENesin 150 MG/15ML LIQD Take 1 Dose by mouth 4 (four) times daily. Reported on 08/08/2015   Yes Historical Provider, MD  hydrochlorothiazide (HYDRODIURIL) 25 MG tablet Take 25 mg by mouth daily. 01/12/15  Yes Historical Provider, MD  levothyroxine (SYNTHROID, LEVOTHROID) 50 MCG tablet Take 50 mcg by mouth daily before breakfast.   Yes Historical Provider, MD  lisinopril (PRINIVIL,ZESTRIL) 40 MG tablet Take 40 mg by mouth daily. 08/04/15  Yes Historical Provider,  MD  mirtazapine (REMERON) 7.5 MG tablet Take 7.5 mg by mouth at bedtime. 07/12/15  Yes Historical Provider, MD  Multiple Vitamins-Minerals (MULTIVITAMIN GUMMIES ADULT PO) Take 1 tablet by mouth daily.   Yes Historical Provider, MD  PARoxetine (PAXIL) 40 MG tablet Take 40 mg by mouth daily. 07/23/15  Yes Historical Provider, MD  polyethylene glycol (MIRALAX / GLYCOLAX) packet Take 17 g by mouth daily. Patient taking differently: Take 17 g by mouth 2 (two) times daily.  08/27/14  Yes Arthor Captain, PA-C  predniSONE (STERAPRED UNI-PAK 21 TAB) 10 MG (21) TBPK  tablet Take 6-5-4-3-2-1 tablets by mouth daily till gone. 08/06/15  Yes Jeralyn Bennett, MD  saccharomyces boulardii (FLORASTOR) 250 MG capsule Take 250 mg by mouth 2 (two) times daily.   Yes Historical Provider, MD  sennosides-docusate sodium (SENOKOT-S) 8.6-50 MG tablet Take 1 tablet by mouth 2 (two) times daily.   Yes Historical Provider, MD  traZODone (DESYREL) 100 MG tablet Take 100 mg by mouth at bedtime.   Yes Historical Provider, MD  acetaminophen (TYLENOL) 325 MG tablet Take 325 mg by mouth every 4 (four) hours as needed for mild pain, moderate pain, fever or headache.    Historical Provider, MD  acetaminophen-codeine (TYLENOL #3) 300-30 MG per tablet Take 1 tablet by mouth every 6 (six) hours as needed for moderate pain or severe pain. Patient not taking: Reported on 08/04/2015 08/27/14   Arthor Captain, PA-C  bisacodyl (DULCOLAX) 10 MG suppository Place 10 mg rectally daily as needed for moderate constipation.    Historical Provider, MD  dicyclomine (BENTYL) 20 MG tablet Take 1 tablet (20 mg total) by mouth 2 (two) times daily. Patient not taking: Reported on 08/04/2015 08/27/14   Arthor Captain, PA-C  ipratropium-albuterol (DUONEB) 0.5-2.5 (3) MG/3ML SOLN Take 3 mLs by nebulization every 4 (four) hours as needed. 08/06/15   Jeralyn Bennett, MD  levofloxacin (LEVAQUIN) 500 MG tablet Take 1 tablet (500 mg total) by mouth daily. 08/08/15   Eber Hong, MD  promethazine (PHENERGAN) 25 MG tablet Take 25 mg by mouth every 6 (six) hours as needed for nausea or vomiting.    Historical Provider, MD  SUMAtriptan (IMITREX) 25 MG tablet Take 25 mg by mouth daily as needed for migraine. May repeat in 2 hours if headache persists or recurs.    Historical Provider, MD   BP 107/52 mmHg  Pulse 76  Temp(Src) 98 F (36.7 C) (Oral)  Resp 17  SpO2 97% Physical Exam  Constitutional: She appears well-developed and well-nourished. No distress.  HENT:  Head: Normocephalic and atraumatic.  Mouth/Throat:  Oropharynx is clear and moist. No oropharyngeal exudate.  Eyes: Conjunctivae and EOM are normal. Pupils are equal, round, and reactive to light. Right eye exhibits no discharge. Left eye exhibits no discharge. No scleral icterus.  Neck: Normal range of motion. Neck supple. No JVD present. No thyromegaly present.  No JVD.  Cardiovascular: Normal rate, regular rhythm, normal heart sounds and intact distal pulses.  Exam reveals no gallop and no friction rub.   No murmur heard. Pulmonary/Chest: Effort normal. Tachypnea noted. No respiratory distress. She has wheezes. She has rhonchi. She has no rales.  Scattered rhonchi. Mild expiratory wheezes bilaterally. Tachypnea. Speaks in short sentences.  Abdominal: Soft. Bowel sounds are normal. She exhibits no distension and no mass. There is no tenderness.  Musculoskeletal: Normal range of motion. She exhibits no edema or tenderness.  No edema.  Lymphadenopathy:    She has no cervical adenopathy.  Neurological: She  is alert. Coordination normal.  Skin: Skin is warm and dry. No rash noted. No erythema.  Psychiatric: She has a normal mood and affect. Her behavior is normal.  Nursing note and vitals reviewed.   ED Course  Procedures   DIAGNOSTIC STUDIES: Oxygen Saturation is 92% on RA, low by my interpretation.    COORDINATION OF CARE: 12:08 AM Ordered breathing treatment, chest x-ray, and labs. Discussed treatment plan with pt at bedside and pt agreed to plan.   Labs Review Labs Reviewed  BASIC METABOLIC PANEL - Abnormal; Notable for the following:    Glucose, Bld 116 (*)    BUN 26 (*)    Calcium 8.6 (*)    All other components within normal limits  CBC - Abnormal; Notable for the following:    Hemoglobin 11.8 (*)    Platelets 131 (*)    All other components within normal limits  LACTIC ACID, PLASMA - Abnormal; Notable for the following:    Lactic Acid, Venous 2.1 (*)    All other components within normal limits  TROPONIN I  LACTIC  ACID, PLASMA    Imaging Review Dg Chest 2 View  08/08/2015  CLINICAL DATA:  SOB, coughing, congestion, chest discomfort for 2 weeks. Pt takes HTN meds. Not diabetic. Nonsmoker. EXAM: CHEST  2 VIEW COMPARISON:  08/04/2015 FINDINGS: Emphysematous changes in the lungs. Opacity in the left lung base could indicate consolidation or atelectasis. This is developing since previous study. No blunting of costophrenic angles. No pneumothorax. Mediastinal contours appear intact. Calcification of the aorta. Degenerative changes in the spine. Multiple thoracic compression fractures, some with kyphoplasty changes. These are unchanged since previous study and likely indicate osteoporosis. IMPRESSION: Emphysematous changes in the lungs. Developing consolidation or atelectasis in the left lung base posteriorly. Electronically Signed   By: Burman Nieves M.D.   On: 08/08/2015 01:04   I have personally reviewed and evaluated these images and lab results as part of my medical decision-making.   EKG Interpretation   Date/Time:  Monday Aug 08 2015 00:17:47 EDT Ventricular Rate:  78 PR Interval:  198 QRS Duration: 83 QT Interval:  369 QTC Calculation: 420 R Axis:   -19 Text Interpretation:  Sinus rhythm Ventricular premature complex  Borderline left axis deviation Consider anterior infarct Minimal ST  elevation, inferior leads since last tracing no significant change  Confirmed by Austine Wiedeman  MD, Jazzlin Clements ([id number]) on 08/08/2015 2:36:13 AM      MDM   Final diagnoses:  Bronchitis  Pulmonary infiltrate    I personally performed the services described in this documentation, which was scribed in my presence. The recorded information has been reviewed and is accurate.     The patient was given nebulized treatments with good improvement, she no longer is feeling short of breath, her oxygen saturation is in a more normal range, she does benefit from having 2 L of oxygen and thus I will prescribe this for short-term  use. She will also need Levaquin as her x-ray does show a possible developing infiltrate. She is not febrile, tachycardic, hypotensive or hypoxic at this time.  , Meds given in ED:  Medications  levofloxacin (LEVAQUIN) tablet 500 mg (not administered)  albuterol (PROVENTIL) (2.5 MG/3ML) 0.083% nebulizer solution 5 mg (5 mg Nebulization Given 08/08/15 0039)  methylPREDNISolone sodium succinate (SOLU-MEDROL) 125 mg/2 mL injection 125 mg (125 mg Intravenous Given 08/08/15 0036)    New Prescriptions   LEVOFLOXACIN (LEVAQUIN) 500 MG TABLET    Take 1 tablet (500 mg  total) by mouth daily.      Eber HongBrian Victorious Kundinger, MD 08/08/15 0400

## 2015-08-08 NOTE — ED Notes (Signed)
Pt sitting in bed comfortably in NAD.  Pt waiting for case management.

## 2015-08-08 NOTE — Progress Notes (Addendum)
CM noted Cm consult for  Reviewed d/c instructions for 08/06/15  Pt was on RA at time of d/c with set up services for nebulizer and meds upon d/c  [facility]/15 after worsening s/s for evaluation of need of oxygen use Pt evaluated and returned to ALF but ALF sent pt back to ED.  CM noted prior to 08/08/15 return to ALF cxr showed Emphysematous changes in the lungs. Developing consolidation or atelectasis in the left lung base posteriorly Spoke with pt and ED RN - ED RN shared a Rx EDP has written for pt to get oxygen at ALF Pt confirms she has Pt confirmed with Cm she had not been receiving oxygen at ALF after her 08/06/15 d/c  Left message for [facility] DME staff Mayra ReelLecretia  0945 updated EDP, Cyndie Chimeguyen Discussed xray noted  0955 Spoke with Mayra ReelLecretia of [facility] to confirm pt would have to either sign a waiver for payment of Oxygen until followed up by a dr or be admitted in as observation for qualification of oxygen per medicare guidelines for ALF placement since pt was not previously d/c on oxygen  1013 CM [facility] Spoke with her about oxygen medicare requirement guidelines Lanora Manislizabeth voiced concern with pt increased s/s on 08/07/15 Cm explained she would be sharing with pt her options offered by [facility] staff Lanora ManisElizabeth requests a return call and for son to be updated 1020 Cm spoke with pt and reviewed the medicare guidelines for qualifying for her oxygen at ALF Cm reviewed options to either sign a waiver for payment of Oxygen until followed up by a dr or be admitted in as observation for qualification of oxygen per medicare guidelines for ALF placement since pt was not previously d/c on oxygen Pt voiced understanding and states she prefers not to return to ALF "not like I was the other night" Discussed with pt that the EDP and admitted MD will have to follow medicare guidelines for admission to verify admission status needs Pt  voiced understanding Pt assisted to get meal on bedside table Pt states she uses a RW at ALF and was w/c bound and recently has been working on walking again Pt asked to get assist to go to bathroom with a RW after eating ED staff updated Pt gave permission for CM to speak with Lanora ManisElizabeth and her son as needed for updates 1028 CM updated EDP that pt prefers not to return to ALF "not like I was the other night" Pt to be evaluated for admission

## 2015-08-08 NOTE — ED Notes (Addendum)
Per EMS [facility] due to [facility] not having oxygen needed for pt that had been discharged from ED tonight. Pt alert and oriented x4 and on 2L O2 via . PT discharged with dx of Bronchitis with Pulmonary infiltrate.

## 2015-08-08 NOTE — ED Notes (Signed)
Patient transported to X-ray 

## 2015-08-08 NOTE — H&P (Signed)
History and Physical    Paula Bird EXB:[id number] DOB: 04-Jun-1925 DOA: 08/08/2015  PCP: [name], MD  Patient coming from: Skilled nursing facility  Chief Complaint: Shortness of breath  HPI: Paula Bird is a 82 y.o. female with medical history significant of cognitive impairment, hypertension, hypothyroidism, paroxysmal atrial fibrillation, chronic obstructive pulmonary disease, who I recently discharged from my service on 08/06/2015 where she was admitted and treated for a COPD exacerbation. During that hospitalization she was treated with systemic steroids, nebulizer treatments, empiric IV antibiotic therapy. Chest x-ray performed on 08/04/2015 did not show acute cardiopulmonary disease. She showed significant clinical improvement and was discharged back to her skilled nursing facility in stable condition. She was discharged on a steroid taper along with DuoNebs. She comes back to the emergency room today with complaints of worsening shortness of breath associate with increased mucus production, generalized weakness and malaise. Repeat chest x-ray today now showing developing consolidation at left lung base not present on previous x-ray.  ED Course: In the emergency department she presented in respiratory distress, was given nebulizer treatments as well as Levaquin. Repeat chest x-ray done in the ER showing developing consolidation involving the left lung base.   Review of Systems: As per HPI otherwise 10 point review of systems negative.   Past Medical History  Diagnosis Date  . Hypertension   . Thyroid disease   . Vertigo     Past Surgical History  Procedure Laterality Date  . Abdominal hysterectomy    . Appendectomy    . Total hip arthroplasty Left   . Total knee arthroplasty Left      reports that she has never smoked. She does not have any smokeless tobacco history on file. She reports that she does not drink alcohol or use illicit drugs.  No Known  Allergies  Family History  Problem Relation Age of Onset  . Hypertension Mother   . Hypertension Son   . Diabetes Neg Hx     Prior to Admission medications   Medication Sig Start Date End Date Taking? Authorizing Provider  acetaminophen (TYLENOL) 325 MG tablet Take 650 mg by mouth every 4 (four) hours as needed for mild pain, moderate pain, fever or headache.    Yes Historical Provider, MD  amiodarone (PACERONE) 200 MG tablet Take 200 mg by mouth daily.    Yes Historical Provider, MD  amitriptyline (ELAVIL) 75 MG tablet Take 75 mg by mouth at bedtime. 07/12/15  Yes Historical Provider, MD  amLODipine (NORVASC) 5 MG tablet Take 5 mg by mouth daily.   Yes Historical Provider, MD  aspirin 325 MG tablet Take 325 mg by mouth daily.   Yes Historical Provider, MD  bisacodyl (DULCOLAX) 10 MG suppository Place 10 mg rectally daily as needed for moderate constipation.   Yes Historical Provider, MD  cholecalciferol (VITAMIN D) 1000 UNITS tablet Take 2,000 Units by mouth daily.   Yes Historical Provider, MD  Cranberry 425 MG CAPS Take 425 mg by mouth 2 (two) times daily.   Yes Historical Provider, MD  diclofenac sodium (VOLTAREN) 1 % GEL Apply 1 application topically daily. Apply to right knee 06/23/15  Yes Historical Provider, MD  GuaiFENesin 150 MG/15ML LIQD Take 1 Dose by mouth 4 (four) times daily. Started 05/12 for 10 days   Yes Historical Provider, MD  hydrochlorothiazide (HYDRODIURIL) 25 MG tablet Take 25 mg by mouth daily. 01/12/15  Yes Historical Provider, MD  ipratropium-albuterol (DUONEB) 0.5-2.5 (3) MG/3ML SOLN Take 3 mLs by nebulization every 4 (  four) hours as needed. Patient taking differently: Take 3 mLs by nebulization every 4 (four) hours as needed (for copd).  08/06/15  Yes Jeralyn Bennett, MD  levothyroxine (SYNTHROID, LEVOTHROID) 50 MCG tablet Take 50 mcg by mouth daily before breakfast.   Yes Historical Provider, MD  lisinopril (PRINIVIL,ZESTRIL) 40 MG tablet Take 40 mg by mouth daily.  08/04/15  Yes Historical Provider, MD  mirtazapine (REMERON) 7.5 MG tablet Take 7.5 mg by mouth at bedtime. 07/12/15  Yes Historical Provider, MD  Multiple Vitamins-Minerals (MULTIVITAMIN GUMMIES ADULT PO) Take 1 tablet by mouth daily.   Yes Historical Provider, MD  PARoxetine (PAXIL) 40 MG tablet Take 40 mg by mouth daily. 07/23/15  Yes Historical Provider, MD  polyethylene glycol (MIRALAX / GLYCOLAX) packet Take 17 g by mouth daily. Patient taking differently: Take 17 g by mouth 2 (two) times daily.  08/27/14  Yes Arthor Captain, PA-C  predniSONE (STERAPRED UNI-PAK 21 TAB) 10 MG (21) TBPK tablet Take 6-5-4-3-2-1 tablets by mouth daily till gone. 08/06/15  Yes Jeralyn Bennett, MD  promethazine (PHENERGAN) 25 MG tablet Take 25 mg by mouth every 6 (six) hours as needed for nausea or vomiting.   Yes Historical Provider, MD  saccharomyces boulardii (FLORASTOR) 250 MG capsule Take 250 mg by mouth 2 (two) times daily.   Yes Historical Provider, MD  sennosides-docusate sodium (SENOKOT-S) 8.6-50 MG tablet Take 1 tablet by mouth 2 (two) times daily.   Yes Historical Provider, MD  SUMAtriptan (IMITREX) 25 MG tablet Take 25 mg by mouth daily as needed for migraine. May repeat in 2 hours if headache persists or recurs.   Yes Historical Provider, MD  traZODone (DESYREL) 100 MG tablet Take 100 mg by mouth at bedtime.   Yes Historical Provider, MD  levofloxacin (LEVAQUIN) 500 MG tablet Take 1 tablet (500 mg total) by mouth daily. 08/08/15   Eber Hong, MD    Physical Exam: Filed Vitals:   08/08/15 1016 08/08/15 1116 08/08/15 1121 08/08/15 1243  BP: 135/64  136/68 147/80  Pulse: 83  92 99  Temp:    98.3 F (36.8 C)  TempSrc:    Oral  Resp: 18  20 20   Height:    5\' 2"  (1.575 m)  Weight:    62.9 kg (138 lb 10.7 oz)  SpO2: 92% 93% 93% 94%    Constitutional: Ill-appearing, awake and alert, having dyspnea at rest Filed Vitals:   08/08/15 1016 08/08/15 1116 08/08/15 1121 08/08/15 1243  BP: 135/64  136/68  147/80  Pulse: 83  92 99  Temp:    98.3 F (36.8 C)  TempSrc:    Oral  Resp: 18  20 20   Height:    5\' 2"  (1.575 m)  Weight:    62.9 kg (138 lb 10.7 oz)  SpO2: 92% 93% 93% 94%   Eyes: PERRL, lids and conjunctivae normal ENMT: Mucous membranes are moist. Posterior pharynx clear of any exudate or lesions.Normal dentition. Dry oral mucosa Neck: normal, supple, no masses, no thyromegaly Respiratory: Having diminished breath sounds bilaterally with extensive expiratory wheezes, rhonchi, crackles. On supplemental oxygen, appears dyspneic at rest Cardiovascular: Tachycardic, Regular rate and rhythm, no murmurs / rubs / gallops. No extremity edema. 2+ pedal pulses. No carotid bruits.  Abdomen: no tenderness, no masses palpated. No hepatosplenomegaly. Bowel sounds positive.  Musculoskeletal: no clubbing / cyanosis. No joint deformity upper and lower extremities. Good ROM, no contractures. Normal muscle tone.  Skin: no rashes, lesions, ulcers. No induration Neurologic: CN 2-12 grossly intact.  Sensation intact, DTR normal. Strength 5/5 in all 4.  Psychiatric: Having mild confusion  Labs on Admission: I have personally reviewed following labs and imaging studies  CBC:  Recent Labs Lab 08/04/15 1645 08/05/15 0520 08/06/15 0519 08/08/15 0101  WBC 4.9 4.7 11.6* 7.7  NEUTROABS 3.1  --   --   --   HGB 12.2 11.0* 11.0* 11.8*  HCT 38.1 35.2* 35.2* 37.4  MCV 81.9 83.6 82.8 83.7  PLT 105* PLATELET CLUMPS NOTED ON SMEAR, UNABLE TO ESTIMATE 122* 131*   Basic Metabolic Panel:  Recent Labs Lab 08/04/15 1645 08/05/15 0520 08/06/15 0519 08/08/15 0101  NA 137 141 140 138  K 3.5 3.4* 3.8 3.6  CL 99* 105 105 101  CO2 29 24 27 31   GLUCOSE 125* 199* 164* 116*  BUN 24* 30* 27* 26*  CREATININE 0.60 0.55 0.45 0.58  CALCIUM 8.9 8.8* 8.9 8.6*   GFR: Estimated Creatinine Clearance: 43.2 mL/min (by C-G formula based on Cr of 0.58). Liver Function Tests:  Recent Labs Lab 08/04/15 1645  AST 20   ALT 18  ALKPHOS 63  BILITOT 0.3  PROT 7.0  ALBUMIN 3.4*   No results for input(s): LIPASE, AMYLASE in the last 168 hours. No results for input(s): AMMONIA in the last 168 hours. Coagulation Profile: No results for input(s): INR, PROTIME in the last 168 hours. Cardiac Enzymes:  Recent Labs Lab 08/08/15 0101  TROPONINI <0.03   BNP (last 3 results) No results for input(s): PROBNP in the last 8760 hours. HbA1C: No results for input(s): HGBA1C in the last 72 hours. CBG:  Recent Labs Lab 08/05/15 0736 08/06/15 0748  GLUCAP 153* 148*   Lipid Profile: No results for input(s): CHOL, HDL, LDLCALC, TRIG, CHOLHDL, LDLDIRECT in the last 72 hours. Thyroid Function Tests: No results for input(s): TSH, T4TOTAL, FREET4, T3FREE, THYROIDAB in the last 72 hours. Anemia Panel: No results for input(s): VITAMINB12, FOLATE, FERRITIN, TIBC, IRON, RETICCTPCT in the last 72 hours. Urine analysis:    Component Value Date/Time   COLORURINE YELLOW 08/13/2014 2256   APPEARANCEUR CLEAR 08/13/2014 2256   LABSPEC 1.016 08/13/2014 2256   PHURINE 6.5 08/13/2014 2256   GLUCOSEU NEGATIVE 08/13/2014 2256   HGBUR NEGATIVE 08/13/2014 2256   BILIRUBINUR NEGATIVE 08/13/2014 2256   KETONESUR NEGATIVE 08/13/2014 2256   PROTEINUR NEGATIVE 08/13/2014 2256   UROBILINOGEN 0.2 08/13/2014 2256   NITRITE NEGATIVE 08/13/2014 2256   LEUKOCYTESUR MODERATE* 08/13/2014 2256   Sepsis Labs: !!!!!!!!!!!!!!!!!!!!!!!!!!!!!!!!!!!!!!!!!!!! @LABRCNTIP (procalcitonin:4,lacticidven:4) ) Recent Results (from the past 240 hour(s))  MRSA PCR Screening     Status: None   Collection Time: 08/05/15  6:23 AM  Result Value Ref Range Status   MRSA by PCR NEGATIVE NEGATIVE Final    Comment:        The GeneXpert MRSA Assay (FDA approved for NASAL specimens only), is one component of a comprehensive MRSA colonization surveillance program. It is not intended to diagnose MRSA infection nor to guide or monitor treatment for MRSA  infections.      Radiological Exams on Admission: Dg Chest 2 View  08/08/2015  CLINICAL DATA:  SOB, coughing, congestion, chest discomfort for 2 weeks. Pt takes HTN meds. Not diabetic. Nonsmoker. EXAM: CHEST  2 VIEW COMPARISON:  08/04/2015 FINDINGS: Emphysematous changes in the lungs. Opacity in the left lung base could indicate consolidation or atelectasis. This is developing since previous study. No blunting of costophrenic angles. No pneumothorax. Mediastinal contours appear intact. Calcification of the aorta. Degenerative changes in the spine. Multiple  thoracic compression fractures, some with kyphoplasty changes. These are unchanged since previous study and likely indicate osteoporosis. IMPRESSION: Emphysematous changes in the lungs. Developing consolidation or atelectasis in the left lung base posteriorly. Electronically Signed   By: Burman Nieves M.D.   On: 08/08/2015 01:04    EKG: Independently reviewed.   Assessment/Plan Principal Problem:   HCAP (healthcare-associated pneumonia) Active Problems:   Hypothyroidism   Respiratory distress   Essential hypertension   COPD exacerbation (HCC)  1.  Healthcare associated pneumonia. Mrs. Mcneff is an 82 year old female who was recently hospitalized for COPD exacerbation presenting now with worsening shortness of breath over the past 24 hours. Workup in the emergency room included a chest x-ray that shows development of a left lung opacity not present on x-ray from recent hospitalization. There is concern that this could represent developing healthcare associated pneumonia, will place her on broad-spectrum IV antibody therapy with cefepime and vancomycin. Obtain blood cultures and sputum cultures. Place her on pneumonia protocol.  2.  COPD exacerbation. On exam she has diffuse expiratory wheezes, dyspnea at rest, requiring supplemental oxygen. I suspect that developing HCAP precipitating COPD exacerbation. Will treat with Solu-Medrol 60 mg  IV every 6 hours along with nebulizer treatments every 4 hours. She will be started on broad-spectrum IV antimicrobial therapy.  3.  History of paroxysmal atrial fibrillation. CHADSVasc score of 4. She is currently not anticoagulated. Initial EKG showing sinus rhythm, continue amiodarone 20 mg by mouth daily  4.  Hypertension. Continue amlodipine 5 mg by mouth daily  5.  Hypothyroidism. Continue Synthroid 50 g by mouth daily   DVT prophylaxis: Lovenox Code Status: DO NOT RESUSCITATE Family Communication: Family not present Disposition Plan: Anticipate she will require rhythm 2 nights hospitalization Consults called:  Admission status: Will admit to the inpatient service   Jeralyn Bennett MD [facility] Pager [phone]  If 7PM-7AM, please contact night-coverage [email or web address] Password [email or web address]  08/08/2015, 1:17 PM

## 2015-08-08 NOTE — Care Management Note (Signed)
Case Management Note  Patient Details  Name: Paula Bird MRN: [id number] Date of Birth: [birth year]  Subjective/Objective: 82 y/o f admitted w/PNA. Readmit-COPD. From [facility].CSW notified. Noted ED CM note.                  Action/Plan:d/c plan return back to ALF.   Expected Discharge Date:   (UNKNOWN)               Expected Discharge Plan:  Assisted Living / Rest Home  In-House Referral:  Clinical Social Work  Discharge planning Services  CM Consult  Post Acute Care Choice:    Choice offered to:     DME Arranged:    DME Agency:     HH Arranged:    HH Agency:     Status of Service:  In process, will continue to follow  Medicare Important Message Given:    Date Medicare IM Given:    Medicare IM give by:    Date Additional Medicare IM Given:    Additional Medicare Important Message give by:     If discussed at Long Length of Stay Meetings, dates discussed:    Additional Comments:  Paula Bird, Paula Clayburn, RN 08/08/2015, 2:59 PM

## 2015-08-08 NOTE — ED Provider Notes (Addendum)
At change of shift at 7:15 AM the patient is waiting for case management to help obtain oxygen and [facility] for this patient at home. She will need a small amount of nasal cannula oxygen to get her through this minor illness. I suggest 2 L by nasal cannula. I will write a prescription for this for the patient to have, she has a prescription for her antibiotic already, Levaquin once a day for the next 7 days. She continues to be appears stable  Change of shift - care signed out to Dr. Cyndie ChimeNguyen at 7:20 AM  [name], MD 08/08/15 [id number]  [name], MD 08/15/15 [phone]

## 2015-08-08 NOTE — ED Notes (Signed)
[facility]- [facility]. EMS reports patient tried to lie down to sleep and was unable to catch her breath. Patient was recently hospitalized for bronchitis and pneumonia. EMS reports O2 sat of 89% on RA and 95% on 2L of 02. Patient was given 1mg  Atrovent and 125mg  of Solumedrol by EMS.

## 2015-08-08 NOTE — ED Notes (Signed)
Pt currently eating breakfast.  RT paged for breathing tx.

## 2015-08-08 NOTE — ED Notes (Signed)
[facility] arrived.  

## 2015-08-08 NOTE — Progress Notes (Signed)
Pharmacy Antibiotic Note  Paula Bird is a 82 y.o. female admitted on 08/08/2015 with pneumonia.  Patient is from NH, recently received doses of Levaquin for acute bronchitis ([facility], was not discharged on abx) but symptoms of SOB worsened and new CXR shows developing infiltrate.  Pharmacy has been consulted for Vancomycin and Cefepime dosing.  Plan: Vancomycin 500 mg IV q12h. Cefepime 1g IV q12h. F/u cultures, SCr, trough levels as indicated.  Height: 5\' 2"  (157.5 cm) (Simultaneous filing. User may not have seen previous data.) Weight: 138 lb 10.7 oz (62.9 kg) IBW/kg (Calculated) : 50.1  Temp (24hrs), Avg:97.9 F (36.6 C), Min:97.4 F (36.3 C), Max:98.3 F (36.8 C)   Recent Labs Lab 08/04/15 1645 08/05/15 0520 08/06/15 0519 08/08/15 0101 08/08/15 0112  WBC 4.9 4.7 11.6* 7.7  --   CREATININE 0.60 0.55 0.45 0.58  --   LATICACIDVEN  --   --   --   --  2.1*    Estimated Creatinine Clearance: 43.2 mL/min (by C-G formula based on Cr of 0.58).    No Known Allergies  Antimicrobials this admission: 5/15 Vancomycin >>  5/15 Cefepime >>   Dose adjustments this admission: -  Microbiology results: 5/15 BCx: sent 5/15 strep pneumo ur ag: ordered  Thank you for allowing pharmacy to be a part of this patient's care.  Clance BollRunyon, Atilano Covelli 08/08/2015 12:54 PM

## 2015-08-08 NOTE — ED Provider Notes (Signed)
Patient reevaluated at bedside. Patient still requiring supplemental oxygen. Breathing treatment was ordered as patient did have wheezing on examination. Patient not in acute distress and eating her breakfast. Discussed with Selena BattenKim from case management. At this time we are not able to facilitate getting the supplemental oxygen outpatient. Discussed with the tried hospitalists who agreed with admission for healthcare associated pneumonia and her new oxygen requirement. Patient updated on plan of care.  Leta BaptistEmily Roe Nguyen, MD 08/08/15 [phone]

## 2015-08-09 ENCOUNTER — Inpatient Hospital Stay (HOSPITAL_COMMUNITY): Payer: Medicare Other

## 2015-08-09 LAB — HIV ANTIBODY (ROUTINE TESTING W REFLEX): HIV SCREEN 4TH GENERATION: NONREACTIVE

## 2015-08-09 MED ORDER — IPRATROPIUM-ALBUTEROL 0.5-2.5 (3) MG/3ML IN SOLN: 3.0000 mL | RESPIRATORY_TRACT | Status: DC | PRN

## 2015-08-09 MED ORDER — DIPHENHYDRAMINE HCL 12.5 MG/5ML PO ELIX
12.5000 mg | ORAL_SOLUTION | Freq: Once | ORAL | Status: AC
  Administered 2015-08-09: 12.5 mg via ORAL
  Filled 2015-08-09: qty 5

## 2015-08-09 NOTE — Evaluation (Signed)
Clinical/Bedside Swallow Evaluation Patient Details  Name: Paula Bird MRN: [id number] Date of Birth: [birth year]  Today's Date: 08/09/2015 Time: SLP Start Time (ACUTE ONLY): 1230 SLP Stop Time (ACUTE ONLY): 1300 SLP Time Calculation (min) (ACUTE ONLY): 30 min  Past Medical History:  Past Medical History  Diagnosis Date  . Hypertension   . Thyroid disease   . Vertigo    Past Surgical History:  Past Surgical History  Procedure Laterality Date  . Abdominal hysterectomy    . Appendectomy    . Total hip arthroplasty Left   . Total knee arthroplasty Left    HPI:  82 yo female adm to [facility] with HCAP. PMH + for cognitive deficit, COPD, GERD, Afib, HTN, vertigo.  Pt CXR showed left lung opacity, MD queried aspiration and ordered SLP swallow evaluation.  Prior MBS June 2016 [facility], barium tablet appeared to lodge at distal esophagus with referrant sensation to pharynx.     Assessment / Plan / Recommendation Clinical Impression  Pt presents with functional oropharyngeal swallow based on clinical swallow evaluation and reviewing instrumental evaluation *MBS* from June 2016.  She does report sensation of slow clearance of solids at times - pointing to distal pharynx.  This occured twice during the session of meal observation.  Consumption of liquids facilitated clearance.    Pt also reported some discomfort with clearance through esophagus x1- pointing to proximal esophagus.  NO s/s of aspiration noted and swallow was timely with clear voice throughout.  Symptoms of "food coming up" reported by pt in June 2016 and pt today states this has improved since that time.  She does admit to recently vomiting after a small portion of a meal.    Pt's intermittent symptoms are consistent with esophageal dysphagia with referrant sensation to pharynx.  Given her hiatal hernia and reflux - she may benefit from dedicated esophageal evalaution or GI given inconsistency of symptoms.    Provided pt and her daughter  Erskine Squibb with review of MBS from 08/2014, xerostomia and esophageal mitigation strategies using teach back.  Thanks for this referral.      Aspiration Risk  Mild aspiration risk    Diet Recommendation Regular;Thin liquid   Liquid Administration via: Cup;Straw Medication Administration: Whole meds with liquid Supervision: Patient able to self feed Compensations: Slow rate;Small sips/bites (start meals with liquids, small meals, consume room temp liquids if helpful) Postural Changes: Seated upright at 90 degrees;Remain upright for at least 30 minutes after po intake    Other  Recommendations Oral Care Recommendations: Oral care BID   Follow up Recommendations  None    Frequency and Duration   n/a             Swallow Study   General Date of Onset: 08/09/15 HPI: 82 yo female adm to [facility] with HCAP. PMH + for cognitive deficit, COPD, GERD, Afib, HTN, vertigo.  Pt CXR showed left lung opacity, MD queried aspiration and ordered SLP swallow evaluation.   Type of Study: Bedside Swallow Evaluation Previous Swallow Assessment: 08/2014 MBS, functional oropharyngeal swallow ability Diet Prior to this Study: Regular;Thin liquids Temperature Spikes Noted: No Respiratory Status: Nasal cannula Behavior/Cognition: Alert;Cooperative;Pleasant mood Oral Cavity Assessment: Within Functional Limits Oral Care Completed by SLP: No Oral Cavity - Dentition: Edentulous (daughter was bringing pt's dentures) Vision: Functional for self-feeding Self-Feeding Abilities: Able to feed self Patient Positioning: Upright in chair Baseline Vocal Quality: Normal Volitional Cough: Strong Volitional Swallow: Able to elicit    Oral/Motor/Sensory Function Overall Oral Motor/Sensory Function: Within  functional limits   Ice Chips Ice chips: Not tested   Thin Liquid Thin Liquid: Within functional limits Presentation: Cup;Self Fed    Nectar Thick Nectar Thick Liquid: Not tested   Honey Thick Honey Thick Liquid: Not tested    Puree Puree: Within functional limits Presentation: Self Fed;Spoon   Solid   GO   Solid: Within functional limits Presentation: Self Fed;Spoon        Donavan Burnetamara Floretta Petro, MS [facility] SLP [phone]

## 2015-08-09 NOTE — Progress Notes (Signed)
PROGRESS NOTE    Paula Bird  UJW:[id number] DOB: [birth year] DOA: 08/08/2015 PCP: [name], MD  Brief Narrative: 82 year old female with a history of cognitive impairment, current resident at skilled nursing facility, recently discharged from my service on 08/06/2015 at which time she was treated for COPD exacerbation. Chest x-ray performed on 08/04/2015 did not show acute cardiopulmonary disease. She showed improvement was discharged back to her skilled nursing facility. She came back to the emergency department on 08/08/2015 with worsening shortness of breath cough and increased sputum production. Chest x-ray now showing developing consolidation involving left lung base. She was admitted for healthcare associated pneumonia, on broad-spectrum IV antimicrobial therapy  Assessment & Plan:   Principal Problem:   HCAP (healthcare-associated pneumonia) Active Problems:   Hypothyroidism   Respiratory distress   Essential hypertension   COPD exacerbation (HCC)   1.  Healthcare associated pneumonia -Mrs. Yeh recently discharged from the hospital on 08/06/2015 at which time she was treated for a COPD exacerbation. X-ray during that hospitalization did not reveal acute cardiopulmonary disease. -She presented with increasing cough, shortness of breath, sputum production. X-ray performed in the emergency room showing development of left lung opacity -Plan to continue empiric IV antibiotic therapy with cefepime and vancomycin -Sputum and blood cultures are pending -Continue supportive care.  2.  COPD exacerbation -On initial evaluation had diffuse expiratory wheezes, rhonchi, dyspnea at rest -Suspect related to COPD exacerbation precipitated by infectious process -She was started on centimeter 60 mg IV every 6 hours along with nebulizer treatments every 4 hours -On my evaluation this morning showing clinical improvement -Continue systemic steroids, nebulizer treatments, IV  antimicrobials  3.  Paroxysmal atrial fibrillation -She is not anticoagulated -EKG on presentation showing sinus rhythm -Continue amiodarone 20 g by mouth daily  4.  Hypertension -Continue amlodipine 5 mg by mouth daily -Blood pressures controlled   DVT prophylaxis: Lovenox Code Status: DO NOT RESUSCITATE Family Communication: I spoke to her son who is her healthcare power of attorney over telephone conversation Disposition Plan: Continue IV antibiotics, systemic steroids, nebulizer treatments, anticipate discharge to skilled nursing facility in the next 48-72 hours  Antimicrobials:   Vancomycin started on 08/08/2015  Cefepime started on 08/08/2015   Subjective: She states for feeling a little better today, breathing easier, awake and alert  Objective: Filed Vitals:   08/08/15 1243 08/08/15 1442 08/08/15 2122 08/09/15 0425  BP: 147/80  145/68 123/69  Pulse: 99  85 75  Temp: 98.3 F (36.8 C)  98.2 F (36.8 C) 97.6 F (36.4 C)  TempSrc: Oral  Oral Oral  Resp: 20  20 20   Height: 5\' 2"  (1.575 m)     Weight: 62.9 kg (138 lb 10.7 oz)     SpO2: 94% 95% 95% 96%    Intake/Output Summary (Last 24 hours) at 08/09/15 5621 Last data filed at 08/09/15 0700  Gross per 24 hour  Intake  922.5 ml  Output      0 ml  Net  922.5 ml   Filed Weights   08/08/15 0642 08/08/15 1243  Weight: 63.05 kg (139 lb) 62.9 kg (138 lb 10.7 oz)    Examination:  General exam: She looks much better on my evaluation today Respiratory system: Interim improvement to lung exam with better air movement, decreased wheezing and rhonchi Cardiovascular system: S1 & S2 heard, RRR. No JVD, murmurs, rubs, gallops or clicks. No pedal edema. Gastrointestinal system: Abdomen is nondistended, soft and nontender. No organomegaly or masses felt. Normal bowel sounds heard.  Central nervous system: Alert and oriented. No focal neurological deficits. Extremities: Symmetric 5 x 5 power. Skin: No rashes, lesions or  ulcers Psychiatry: Judgement and insight appear normal. Mood & affect appropriate.     Data Reviewed: I have personally reviewed following labs and imaging studies  CBC:  Recent Labs Lab 08/04/15 1645 08/05/15 0520 08/06/15 0519 08/08/15 0101  WBC 4.9 4.7 11.6* 7.7  NEUTROABS 3.1  --   --   --   HGB 12.2 11.0* 11.0* 11.8*  HCT 38.1 35.2* 35.2* 37.4  MCV 81.9 83.6 82.8 83.7  PLT 105* PLATELET CLUMPS NOTED ON SMEAR, UNABLE TO ESTIMATE 122* 131*   Basic Metabolic Panel:  Recent Labs Lab 08/04/15 1645 08/05/15 0520 08/06/15 0519 08/08/15 0101  NA 137 141 140 138  K 3.5 3.4* 3.8 3.6  CL 99* 105 105 101  CO2 29 24 27 31   GLUCOSE 125* 199* 164* 116*  BUN 24* 30* 27* 26*  CREATININE 0.60 0.55 0.45 0.58  CALCIUM 8.9 8.8* 8.9 8.6*   GFR: Estimated Creatinine Clearance: 43.2 mL/min (by C-G formula based on Cr of 0.58). Liver Function Tests:  Recent Labs Lab 08/04/15 1645  AST 20  ALT 18  ALKPHOS 63  BILITOT 0.3  PROT 7.0  ALBUMIN 3.4*   No results for input(s): LIPASE, AMYLASE in the last 168 hours. No results for input(s): AMMONIA in the last 168 hours. Coagulation Profile: No results for input(s): INR, PROTIME in the last 168 hours. Cardiac Enzymes:  Recent Labs Lab 08/08/15 0101  TROPONINI <0.03   BNP (last 3 results) No results for input(s): PROBNP in the last 8760 hours. HbA1C: No results for input(s): HGBA1C in the last 72 hours. CBG:  Recent Labs Lab 08/05/15 0736 08/06/15 0748  GLUCAP 153* 148*   Lipid Profile: No results for input(s): CHOL, HDL, LDLCALC, TRIG, CHOLHDL, LDLDIRECT in the last 72 hours. Thyroid Function Tests: No results for input(s): TSH, T4TOTAL, FREET4, T3FREE, THYROIDAB in the last 72 hours. Anemia Panel: No results for input(s): VITAMINB12, FOLATE, FERRITIN, TIBC, IRON, RETICCTPCT in the last 72 hours. Sepsis Labs:  Recent Labs Lab 08/04/15 2011 08/06/15 0519 08/08/15 0112  PROCALCITON <0.10 0.21  --     LATICACIDVEN  --   --  2.1*    Recent Results (from the past 240 hour(s))  MRSA PCR Screening     Status: None   Collection Time: 08/05/15  6:23 AM  Result Value Ref Range Status   MRSA by PCR NEGATIVE NEGATIVE Final    Comment:        The GeneXpert MRSA Assay (FDA approved for NASAL specimens only), is one component of a comprehensive MRSA colonization surveillance program. It is not intended to diagnose MRSA infection nor to guide or monitor treatment for MRSA infections.   Culture, blood (routine x 2) Call MD if unable to obtain prior to antibiotics being given     Status: None (Preliminary result)   Collection Time: 08/08/15  1:04 PM  Result Value Ref Range Status   Specimen Description   Final    BLOOD LEFT HAND [facility]    Special Requests   Final    BOTTLES DRAWN AEROBIC AND ANAEROBIC BLUE 5CC RED 3CC Performed [facility]    Culture PENDING  Incomplete   Report Status PENDING  Incomplete         Radiology Studies: Dg Chest 2 View  08/08/2015  CLINICAL DATA:  SOB, coughing, congestion,  chest discomfort for 2 weeks. Pt takes HTN meds. Not diabetic. Nonsmoker. EXAM: CHEST  2 VIEW COMPARISON:  08/04/2015 FINDINGS: Emphysematous changes in the lungs. Opacity in the left lung base could indicate consolidation or atelectasis. This is developing since previous study. No blunting of costophrenic angles. No pneumothorax. Mediastinal contours appear intact. Calcification of the aorta. Degenerative changes in the spine. Multiple thoracic compression fractures, some with kyphoplasty changes. These are unchanged since previous study and likely indicate osteoporosis. IMPRESSION: Emphysematous changes in the lungs. Developing consolidation or atelectasis in the left lung base posteriorly. Electronically Signed   By: Burman Nieves M.D.   On: 08/08/2015 01:04        Scheduled Meds: . amiodarone  200 mg Oral Daily  .  amitriptyline  75 mg Oral QHS  . amLODipine  5 mg Oral Daily  . antiseptic oral rinse  7 mL Mouth Rinse q12n4p  . aspirin  325 mg Oral Daily  . ceFEPime (MAXIPIME) IV  1 g Intravenous Q12H  . chlorhexidine  15 mL Mouth Rinse BID  . enoxaparin (LOVENOX) injection  40 mg Subcutaneous Q24H  . ipratropium-albuterol  3 mL Nebulization TID  . levothyroxine  50 mcg Oral QAC breakfast  . methylPREDNISolone (SOLU-MEDROL) injection  60 mg Intravenous Q6H  . mirtazapine  7.5 mg Oral QHS  . PARoxetine  40 mg Oral QHS  . saccharomyces boulardii  250 mg Oral BID  . senna-docusate  1 tablet Oral BID  . traZODone  100 mg Oral QHS  . vancomycin  500 mg Intravenous Q12H   Continuous Infusions:    LOS: 1 day    Time spent: 35 minutes   Jeralyn Bennett, MD [facility] Pager [phone]  If 7PM-7AM, please contact night-coverage [email or web address] Password [email or web address] 08/09/2015, 8:08 AM

## 2015-08-09 NOTE — NC FL2 (Deleted)
[place] MEDICAID FL2 LEVEL OF CARE SCREENING TOOL     IDENTIFICATION  Patient Name: Paula Bird Birthdate: 06/03/1925 Sex: female Admission Date (Current Location): 08/08/2015  [facility] and [place] Number:  Producer, television/film/video and Address:  [facility],  [address], [place] [address]      Provider Number: [id number]  Attending Physician Name and Address:  Jeralyn Bennett, MD  Relative Name and Phone Number:       Current Level of Care: Hospital Recommended Level of Care: Skilled Nursing Facility Prior Approval Number:    Date Approved/Denied:   PASRR Number: [id number]  Discharge Plan: Home    Current Diagnoses: Patient Active Problem List   Diagnosis Date Noted  . HCAP (healthcare-associated pneumonia) 08/08/2015  . Respiratory distress 08/08/2015  . Essential hypertension 08/08/2015  . COPD exacerbation (HCC) 08/08/2015  . Dyspnea 08/04/2015  . Hypertension 08/04/2015  . Depression 08/04/2015  . Insomnia 08/04/2015  . Obstructive chronic bronchitis with acute exacerbation (HCC) 08/04/2015  . Thrombocytopenia (HCC) 08/04/2015  . Paroxysmal atrial fibrillation (HCC) 08/04/2015  . Acute exacerbation of chronic obstructive bronchitis (HCC) 08/04/2015  . Hypothyroidism 08/04/2015    Orientation RESPIRATION BLADDER Height & Weight     Self, Time, Situation, Place  O2 (2L) Continent Weight: 138 lb 10.7 oz (62.9 kg) Height:   (157.5 cm) (Simultaneous filing. User may not have seen previous data.)  BEHAVIORAL SYMPTOMS/MOOD NEUROLOGICAL BOWEL NUTRITION STATUS      Continent Diet (Heart)  AMBULATORY STATUS COMMUNICATION OF NEEDS Skin   Limited Assist Verbally Normal                       Personal Care Assistance Level of Assistance  Bathing, Dressing Bathing Assistance: Limited assistance   Dressing Assistance: Limited assistance     Functional Limitations Info             SPECIAL CARE FACTORS FREQUENCY  PT (By  licensed PT), OT (By licensed OT)     PT Frequency: 5 OT Frequency: 5            Contractures      Additional Factors Info  Code Status, Allergies Code Status Info: DNR Allergies Info: NKDA           Current Medications (08/09/2015):  This is the current hospital active medication list Current Facility-Administered Medications  Medication Dose Route Frequency Provider Last Rate Last Dose  . amiodarone (PACERONE) tablet 200 mg  200 mg Oral Daily Jeralyn Bennett, MD   200 mg at 08/09/15 1032  . amitriptyline (ELAVIL) tablet 75 mg  75 mg Oral QHS Jeralyn Bennett, MD   75 mg at 08/08/15 2100  . amLODipine (NORVASC) tablet 5 mg  5 mg Oral Daily Jeralyn Bennett, MD   5 mg at 08/09/15 1031  . antiseptic oral rinse (CPC / CETYLPYRIDINIUM CHLORIDE 0.05%) solution 7 mL  7 mL Mouth Rinse q12n4p Jeralyn Bennett, MD   7 mL at 08/08/15 1600  . aspirin tablet 325 mg  325 mg Oral Daily Jeralyn Bennett, MD   325 mg at 08/09/15 1031  . bisacodyl (DULCOLAX) suppository 10 mg  10 mg Rectal Daily PRN Jeralyn Bennett, MD      . ceFEPIme (MAXIPIME) 1 g in dextrose 5 % 50 mL IVPB  1 g Intravenous Q12H Maryanna Shape Runyon, RPH   1 g at 08/09/15 0336  . chlorhexidine (PERIDEX) 0.12 % solution 15 mL  15 mL Mouth Rinse BID Ezequiel  Vanessa BarbaraZamora, MD   15 mL at 08/09/15 1031  . enoxaparin (LOVENOX) injection 40 mg  40 mg Subcutaneous Q24H Jeralyn BennettEzequiel Zamora, MD   40 mg at 08/08/15 2100  . ipratropium-albuterol (DUONEB) 0.5-2.5 (3) MG/3ML nebulizer solution 3 mL  3 mL Nebulization TID Jeralyn BennettEzequiel Zamora, MD   3 mL at 08/08/15 1442  . levothyroxine (SYNTHROID, LEVOTHROID) tablet 50 mcg  50 mcg Oral QAC breakfast Jeralyn BennettEzequiel Zamora, MD   50 mcg at 08/09/15 1031  . methylPREDNISolone sodium succinate (SOLU-MEDROL) 125 mg/2 mL injection 60 mg  60 mg Intravenous Q6H Jeralyn BennettEzequiel Zamora, MD   60 mg at 08/09/15 0615  . mirtazapine (REMERON) tablet 7.5 mg  7.5 mg Oral QHS Jeralyn BennettEzequiel Zamora, MD   7.5 mg at 08/08/15 2101  . PARoxetine (PAXIL)  tablet 40 mg  40 mg Oral QHS Jeralyn BennettEzequiel Zamora, MD   40 mg at 08/08/15 2101  . saccharomyces boulardii (FLORASTOR) capsule 250 mg  250 mg Oral BID Jeralyn BennettEzequiel Zamora, MD   250 mg at 08/09/15 1031  . senna-docusate (Senokot-S) tablet 1 tablet  1 tablet Oral BID Jeralyn BennettEzequiel Zamora, MD   1 tablet at 08/08/15 2100  . SUMAtriptan (IMITREX) tablet 25 mg  25 mg Oral Daily PRN Jeralyn BennettEzequiel Zamora, MD      . traZODone (DESYREL) tablet 100 mg  100 mg Oral QHS Jeralyn BennettEzequiel Zamora, MD   100 mg at 08/08/15 2100  . vancomycin (VANCOCIN) 500 mg in sodium chloride 0.9 % 100 mL IVPB  500 mg Intravenous Q12H Teressa Lowermanda M Runyon, RPH   500 mg at 08/09/15 0129     Discharge Medications: Please see discharge summary for a list of discharge medications.  Relevant Imaging Results:  Relevant Lab Results:   Additional Information SSN: [id number]  Arlyss RepressHarrison, Jayvion Stefanski F, LCSW

## 2015-08-09 NOTE — Evaluation (Addendum)
Physical Therapy Evaluation Patient Details Name: Paula Bird MRN: [id number] DOB: 03-07-1926 Today's Date: 08/09/2015   History of Present Illness  82 yo female admitted with HCAP.   Clinical Impression  On eval, pt required Min assist for mobility-walked ~15'x1, 10'x1 with rolling walker. O2 sats 93% on 2L at rest, 90% on 2L O2 with activity. Dyspnea 2-3/4. Pt fatigues fairly easily. Pt expressed desire to be more active at ALF. Recommend ST rehab at SNF. If SNF not an option, then recommend HHPT follow up at ALF.      Follow Up Recommendations SNF    Equipment Recommendations  None recommended by PT    Recommendations for Other Services       Precautions / Restrictions Precautions Precautions: Fall Precaution Comments: monitor O2 sats Restrictions Weight Bearing Restrictions: No      Mobility  Bed Mobility Overal bed mobility: Needs Assistance Bed Mobility: Supine to Sit     Supine to sit: Supervision;HOB elevated     General bed mobility comments: for safety  Transfers Overall transfer level: Needs assistance Equipment used: Rolling walker (2 wheeled) Transfers: Sit to/from Stand Sit to Stand: Min assist         General transfer comment: small amount of assist to stabilize.   Ambulation/Gait Ambulation/Gait assistance: Min assist Ambulation Distance (Feet): 15 Feet (10'x1, 15'x1) Assistive device: Rolling walker (2 wheeled) Gait Pattern/deviations: Step-through pattern;Decreased stride length;Trunk flexed     General Gait Details: Assist to stabilize pt. Unsteady. Fatigues fairly quickly. O2 sats 90% on 2L O2. Dyspnea 2-3/4.  Stairs            Wheelchair Mobility    Modified Rankin (Stroke Patients Only)       Balance Overall balance assessment: Needs assistance;History of Falls         Standing balance support: Bilateral upper extremity supported;During functional activity Standing balance-Leahy Scale: Poor Standing balance  comment: requires RW and external assistance for safety                             Pertinent Vitals/Pain Pain Assessment: No/denies pain    Home Living Family/patient expects to be discharged to:: Assisted living               Home Equipment: Walker - 4 wheels;Wheelchair - manual      Prior Function Level of Independence: Needs assistance   Gait / Transfers Assistance Needed: uses wheelchair primarily. Pt does a little walking with rollator and assistance           Hand Dominance        Extremity/Trunk Assessment   Upper Extremity Assessment: Generalized weakness           Lower Extremity Assessment: Generalized weakness      Cervical / Trunk Assessment: Kyphotic  Communication   Communication: No difficulties  Cognition Arousal/Alertness: Awake/alert Behavior During Therapy: WFL for tasks assessed/performed Overall Cognitive Status: Within Functional Limits for tasks assessed                      General Comments      Exercises        Assessment/Plan    PT Assessment Patient needs continued PT services  PT Diagnosis Difficulty walking;Generalized weakness   PT Problem List Decreased strength;Decreased activity tolerance;Decreased balance;Decreased mobility;Decreased knowledge of use of DME;Decreased safety awareness  PT Treatment Interventions DME instruction;Gait training;Functional mobility training;Therapeutic activities;Patient/family education;Balance training;Therapeutic exercise  PT Goals (Current goals can be found in the Care Plan section) Acute Rehab PT Goals Patient Stated Goal: for facility to allow her to exercise and walk more PT Goal Formulation: With patient Time For Goal Achievement: 08/23/15 Potential to Achieve Goals: Good    Frequency Min 3X/week   Barriers to discharge        Co-evaluation               End of Session Equipment Utilized During Treatment: Gait belt Activity Tolerance:  Patient limited by fatigue Patient left: in chair;with call bell/phone within reach;with chair alarm set           Time: 1100-1127 PT Time Calculation (min) (ACUTE ONLY): 27 min   Charges:   PT Evaluation $PT Eval Low Complexity: 1 Procedure PT Treatments $Gait Training: 8-22 mins   PT G Codes:        Paula Bird, MPT Pager: [phone]

## 2015-08-09 NOTE — Progress Notes (Addendum)
CSW received consult that patient was admitted from [facility]. CSW met with patient & daughter, Paula Bird at bedside re: discharge planning. CSW reviewed PT evaluation recommending SNF vs. Return to ALF with [facility]. CSW provided patient/daughter with SNF list and sent information out to [facility] they agree to go to rehab before returning to ALF.   Patient's daughter to discuss with her brother (patinet's son, Luvenia Heller) and will call CSW with decision tomorrow.    Raynaldo Opitz, [facility] Clinical Social Worker cell #: [phone]  CLINICAL SOCIAL WORK PLACEMENT  NOTE  Date:  08/09/2015  Patient Details  Name: Paula Bird MRN: [id number] Date of Birth: December 06, 1925  Clinical Social Work is seeking post-discharge placement for this patient at the [place] level of care (*CSW will initial, date and re-position this form in  chart as items are completed):  Yes   Patient/family provided with [facility] Work Department's list of facilities offering this level of care within the geographic area requested by the patient (or if unable, by the patient's family).  Yes   Patient/family informed of their freedom to choose among providers that offer the needed level of care, that participate in Medicare, Medicaid or managed care program needed by the patient, have an available bed and are willing to accept the patient.  Yes   Patient/family informed of [facility]'s ownership interest in [facility] and [facility], as well as of the fact that they are under no obligation to receive care at these facilities.  PASRR submitted to EDS on 08/09/15     PASRR number received on 08/09/15     Existing PASRR number confirmed on       FL2 transmitted to all facilities in geographic area requested by pt/family on 08/09/15     FL2 transmitted to all facilities within larger geographic area on       Patient  informed that his/her managed care company has contracts with or will negotiate with certain facilities, including the following:        Yes   Patient/family informed of bed offers received.  Patient chooses bed at       Physician recommends and patient chooses bed at      Patient to be transferred to   on  .  Patient to be transferred to facility by       Patient family notified on   of transfer.  Name of family member notified:        PHYSICIAN       Additional Comment:    _______________________________________________ Standley Brooking, LCSW 08/09/2015, 3:54 PM

## 2015-08-09 NOTE — Care Management Note (Signed)
Case Management Note  Patient Details  Name: Paula Bird MRN: [id number] Date of Birth: 11-20-1925  Subjective/Objective: Noted on 02-if home 02 needed will need to be checked-completely in a progress note-on ra @ rest, on ra ambulating, then w/02.[facility] dme rep Mayra ReelLecretia already following for complete qualifying 02 sats,  Home 02 order.d/c plan rturn back to ALF-CSW following.                   Action/Plan:d/c ALF   Expected Discharge Date:   (UNKNOWN)               Expected Discharge Plan:  Assisted Living / Rest Home  In-House Referral:  Clinical Social Work  Discharge planning Services  CM Consult  Post Acute Care Choice:    Choice offered to:     DME Arranged:    DME Agency:     HH Arranged:    HH Agency:     Status of Service:  In process, will continue to follow  Medicare Important Message Given:    Date Medicare IM Given:    Medicare IM give by:    Date Additional Medicare IM Given:    Additional Medicare Important Message give by:     If discussed at Long Length of Stay Meetings, dates discussed:    Additional Comments:  Lanier ClamMahabir, [name], RN 08/09/2015, 12:49 PM

## 2015-08-10 ENCOUNTER — Inpatient Hospital Stay (HOSPITAL_COMMUNITY): Payer: Medicare Other

## 2015-08-10 DIAGNOSIS — I10 Essential (primary) hypertension: Secondary | ICD-10-CM

## 2015-08-10 DIAGNOSIS — J189 Pneumonia, unspecified organism: Secondary | ICD-10-CM

## 2015-08-10 DIAGNOSIS — J441 Chronic obstructive pulmonary disease with (acute) exacerbation: Secondary | ICD-10-CM

## 2015-08-10 DIAGNOSIS — E038 Other specified hypothyroidism: Secondary | ICD-10-CM

## 2015-08-10 LAB — BASIC METABOLIC PANEL
ANION GAP: 5 (ref 5–15)
BUN: 31 mg/dL — AB (ref 6–20)
CO2: 26 mmol/L (ref 22–32)
Calcium: 8.2 mg/dL — ABNORMAL LOW (ref 8.9–10.3)
Chloride: 108 mmol/L (ref 101–111)
Creatinine, Ser: 0.43 mg/dL — ABNORMAL LOW (ref 0.44–1.00)
Glucose, Bld: 145 mg/dL — ABNORMAL HIGH (ref 65–99)
POTASSIUM: 3.7 mmol/L (ref 3.5–5.1)
SODIUM: 139 mmol/L (ref 135–145)

## 2015-08-10 LAB — CBC
HEMATOCRIT: 34.1 % — AB (ref 36.0–46.0)
Hemoglobin: 10.7 g/dL — ABNORMAL LOW (ref 12.0–15.0)
MCH: 25.5 pg — ABNORMAL LOW (ref 26.0–34.0)
MCHC: 31.4 g/dL (ref 30.0–36.0)
MCV: 81.2 fL (ref 78.0–100.0)
Platelets: UNDETERMINED 10*3/uL (ref 150–400)
RBC: 4.2 MIL/uL (ref 3.87–5.11)
RDW: 15.2 % (ref 11.5–15.5)
WBC: 9.4 10*3/uL (ref 4.0–10.5)

## 2015-08-10 LAB — EXPECTORATED SPUTUM ASSESSMENT W GRAM STAIN, RFLX TO RESP C: Special Requests: NORMAL

## 2015-08-10 LAB — EXPECTORATED SPUTUM ASSESSMENT W REFEX TO RESP CULTURE

## 2015-08-10 MED ORDER — GUAIFENESIN ER 600 MG PO TB12
600.0000 mg | ORAL_TABLET | Freq: Two times a day (BID) | ORAL | Status: DC
Start: 2015-08-10 — End: 2015-08-16
  Administered 2015-08-10 – 2015-08-16 (×12): 600 mg via ORAL
  Filled 2015-08-10 (×12): qty 1

## 2015-08-10 MED ORDER — IPRATROPIUM-ALBUTEROL 0.5-2.5 (3) MG/3ML IN SOLN
3.0000 mL | Freq: Three times a day (TID) | RESPIRATORY_TRACT | Status: DC
  Administered 2015-08-10 – 2015-08-14 (×12): 3 mL via RESPIRATORY_TRACT
  Filled 2015-08-10 (×12): qty 3

## 2015-08-10 MED ORDER — FAMOTIDINE 20 MG PO TABS
20.0000 mg | ORAL_TABLET | Freq: Every day | ORAL | Status: DC
  Administered 2015-08-10 – 2015-08-15 (×6): 20 mg via ORAL
  Filled 2015-08-10 (×6): qty 1

## 2015-08-10 MED ORDER — IPRATROPIUM-ALBUTEROL 0.5-2.5 (3) MG/3ML IN SOLN: 3.0000 mL | Freq: Three times a day (TID) | RESPIRATORY_TRACT | Status: DC

## 2015-08-10 MED ORDER — FLUTICASONE PROPIONATE 50 MCG/ACT NA SUSP
2.0000 | Freq: Every day | NASAL | Status: DC
  Administered 2015-08-11 – 2015-08-16 (×6): 2 via NASAL
  Filled 2015-08-10: qty 16

## 2015-08-10 MED ORDER — ALBUTEROL SULFATE (2.5 MG/3ML) 0.083% IN NEBU
2.5000 mg | INHALATION_SOLUTION | RESPIRATORY_TRACT | Status: DC | PRN
  Administered 2015-08-11: 2.5 mg via RESPIRATORY_TRACT
  Filled 2015-08-10: qty 3

## 2015-08-10 NOTE — Progress Notes (Signed)
Pharmacy Antibiotic Note  Paula Bird is a 82 y.o. female admitted on 08/08/2015 with pneumonia.  Patient is from NH, recently received doses of Levaquin for acute bronchitis ([facility], was not discharged on abx) but symptoms of SOB worsened and new CXR shows developing infiltrate.  Pharmacy has been consulted for Vancomycin and Cefepime dosing.  Plan: Vancomycin 500 mg IV q12h and will check trough tonight prior to 6th overall vanc dose Cefepime 1g IV q12h. F/u cultures, SCr, trough levels as indicated.  Height: 5\' 2"  (157.5 cm) (Simultaneous filing. User may not have seen previous data.) Weight: 138 lb 10.7 oz (62.9 kg) IBW/kg (Calculated) : 50.1  Temp (24hrs), Avg:98.1 F (36.7 C), Min:97.9 F (36.6 C), Max:98.2 F (36.8 C)   Recent Labs Lab 08/04/15 1645 08/05/15 0520 08/06/15 0519 08/08/15 0101 08/08/15 0112 08/10/15 0454  WBC 4.9 4.7 11.6* 7.7  --  9.4  CREATININE 0.60 0.55 0.45 0.58  --  0.43*  LATICACIDVEN  --   --   --   --  2.1*  --     Estimated Creatinine Clearance: 43.2 mL/min (by C-G formula based on Cr of 0.43).    No Known Allergies  Antimicrobials this admission: 5/15 Vancomycin >>  5/15 Cefepime >>   Dose adjustments this admission: 5/18 Vt at 0100 = ___ on 500mg  q12   Microbiology results: 5/15 BCx: ngtd 5/15 strep pneumo ur ag: ordered 5/16 sputum: ordered 5/16 HIV: nonreactive   Thank you for allowing pharmacy to be a part of this patient's care.   Hessie KnowsJustin M Hannibal Skalla, PharmD, BCPS Pager [phone] 08/10/2015 10:24 AM

## 2015-08-10 NOTE — Progress Notes (Signed)
PROGRESS NOTE    Paula Bird  NWG:[id number] DOB: 1925/05/13 DOA: 08/08/2015 PCP: Paula Jenny, MD  Brief Narrative: 82 year old female with a history of cognitive impairment, current resident at skilled nursing facility, recently discharged from my service on 08/06/2015 at which time she was treated for COPD exacerbation. Chest x-ray performed on 08/04/2015 did not show acute cardiopulmonary disease. She showed improvement was discharged back to her skilled nursing facility. She came back to the emergency department on 08/08/2015 with worsening shortness of breath cough and increased sputum production. Chest x-ray now showing developing consolidation involving left lung base. She was admitted for healthcare associated pneumonia, on broad-spectrum IV antimicrobial therapy  Assessment & Plan:   Principal Problem:   HCAP (healthcare-associated pneumonia) Active Problems:   Hypothyroidism   Respiratory distress   Essential hypertension   COPD exacerbation (HCC)   1.  Healthcare associated pneumonia vs aspiration pna, acute hypoxia respiratory failure -Paula Bird recently discharged from the hospital on 08/06/2015 at which time she was treated for a COPD exacerbation. X-ray during that hospitalization did not reveal acute cardiopulmonary disease. -She presented with increasing cough, shortness of breath, sputum production. X-ray performed in the emergency room showing development of left lung opacity -Plan to continue empiric IV antibiotic therapy with cefepime and vancomycin -Sputum and blood cultures are pending -Continue supportive care. May need to discharge on oxygen  2.  COPD exacerbation -On initial evaluation had diffuse expiratory wheezes, rhonchi, dyspnea at rest, denies history of long term smoking or second hand smoking, but did report having exacerbations and bronchitic yearly, was treated with steroids in the past. -Suspect related to COPD exacerbation precipitated by  infectious process -She was started on centimeter 60 mg IV every 6 hours along with nebulizer treatments every 4 hours -Continue systemic steroids, nebulizer treatments, IV antimicrobials, no wheezing on 5/17 but consistent cough, upper airway congestion  3.  Paroxysmal atrial fibrillation -She is not anticoagulated due to h/o frequent falls -EKG on presentation showing sinus rhythm -Continue amiodarone 20 g by mouth daily  4.  Hypertension -Continue amlodipine 5 mg by mouth daily -Blood pressures controlled  5. Anxiety: continue home meds   DVT prophylaxis: Lovenox Code Status: DO NOT RESUSCITATE Family Communication: my college Dr Paula Bird  spoke to her son who is her healthcare power of attorney over telephone conversation, i talked to daughter Paula Bird in room on 5/17 Disposition Plan: Continue IV antibiotics, systemic steroids, nebulizer treatments, anticipate discharge to skilled nursing facility in the next 48-72 hours  Antimicrobials:   Vancomycin started on 08/08/2015  Cefepime started on 08/08/2015   Subjective: Persistent cough, non productive, on oxygen supplement, daughter in room  Objective: Filed Vitals:   08/09/15 1405 08/09/15 2043 08/10/15 0513 08/10/15 1420  BP:  136/73 133/53 143/57  Pulse:  78 71 78  Temp:  97.9 F (36.6 C) 98.2 F (36.8 C) 98 F (36.7 C)  TempSrc:  Oral Oral Oral  Resp:  18 20 20   Height:      Weight:      SpO2: 95% 93% 94% 96%   No intake or output data in the 24 hours ending 08/10/15 1656 Filed Weights   08/08/15 5784 08/08/15 1243  Weight: 63.05 kg (139 lb) 62.9 kg (138 lb 10.7 oz)    Examination:  General exam: frail, constant cough during evaluation Respiratory system: previously documented wheezing has resolved today, + rhonchi and upper airway sounds, no rales, Cardiovascular system: S1 & S2 heard, RRR. No JVD, murmurs, rubs, gallops  or clicks. No pedal edema. Gastrointestinal system: Abdomen is nondistended, soft and  nontender. No organomegaly or masses felt. Normal bowel sounds heard. Central nervous system: Alert and oriented. No focal neurological deficits. Extremities: Symmetric 5 x 5 power. Skin: No rashes, lesions or ulcers Psychiatry: Judgement and insight appear normal. Mood & affect appropriate.     Data Reviewed: I have personally reviewed following labs and imaging studies  CBC:  Recent Labs Lab 08/04/15 1645 08/05/15 0520 08/06/15 0519 08/08/15 0101 08/10/15 0454  WBC 4.9 4.7 11.6* 7.7 9.4  NEUTROABS 3.1  --   --   --   --   HGB 12.2 11.0* 11.0* 11.8* 10.7*  HCT 38.1 35.2* 35.2* 37.4 34.1*  MCV 81.9 83.6 82.8 83.7 81.2  PLT 105* PLATELET CLUMPS NOTED ON SMEAR, UNABLE TO ESTIMATE 122* 131* PLATELET CLUMPS NOTED ON SMEAR, UNABLE TO ESTIMATE   Basic Metabolic Panel:  Recent Labs Lab 08/04/15 1645 08/05/15 0520 08/06/15 0519 08/08/15 0101 08/10/15 0454  NA 137 141 140 138 139  K 3.5 3.4* 3.8 3.6 3.7  CL 99* 105 105 101 108  CO2 29 24 27 31 26   GLUCOSE 125* 199* 164* 116* 145*  BUN 24* 30* 27* 26* 31*  CREATININE 0.60 0.55 0.45 0.58 0.43*  CALCIUM 8.9 8.8* 8.9 8.6* 8.2*   GFR: Estimated Creatinine Clearance: 43.2 mL/min (by C-G formula based on Cr of 0.43). Liver Function Tests:  Recent Labs Lab 08/04/15 1645  AST 20  ALT 18  ALKPHOS 63  BILITOT 0.3  PROT 7.0  ALBUMIN 3.4*   No results for input(s): LIPASE, AMYLASE in the last 168 hours. No results for input(s): AMMONIA in the last 168 hours. Coagulation Profile: No results for input(s): INR, PROTIME in the last 168 hours. Cardiac Enzymes:  Recent Labs Lab 08/08/15 0101  TROPONINI <0.03   BNP (last 3 results) No results for input(s): PROBNP in the last 8760 hours. HbA1C: No results for input(s): HGBA1C in the last 72 hours. CBG:  Recent Labs Lab 08/05/15 0736 08/06/15 0748  GLUCAP 153* 148*   Lipid Profile: No results for input(s): CHOL, HDL, LDLCALC, TRIG, CHOLHDL, LDLDIRECT in the last 72  hours. Thyroid Function Tests: No results for input(s): TSH, T4TOTAL, FREET4, T3FREE, THYROIDAB in the last 72 hours. Anemia Panel: No results for input(s): VITAMINB12, FOLATE, FERRITIN, TIBC, IRON, RETICCTPCT in the last 72 hours. Sepsis Labs:  Recent Labs Lab 08/04/15 2011 08/06/15 0519 08/08/15 0112  PROCALCITON <0.10 0.21  --   LATICACIDVEN  --   --  2.1*    Recent Results (from the past 240 hour(s))  MRSA PCR Screening     Status: None   Collection Time: 08/05/15  6:23 AM  Result Value Ref Range Status   MRSA by PCR NEGATIVE NEGATIVE Final    Comment:        The GeneXpert MRSA Assay (FDA approved for NASAL specimens only), is one component of a comprehensive MRSA colonization surveillance program. It is not intended to diagnose MRSA infection nor to guide or monitor treatment for MRSA infections.   Culture, blood (routine x 2) Call MD if unable to obtain prior to antibiotics being given     Status: None (Preliminary result)   Collection Time: 08/08/15  1:04 PM  Result Value Ref Range Status   Specimen Description   Final    BLOOD LEFT HAND [facility]formed [facility]    Special Requests   Final    BOTTLES DRAWN AEROBIC  AND ANAEROBIC BLUE 5CC RED 3CC   Culture   Final    NO GROWTH 2 DAYS Performed at [facility]    Report Status PENDING  Incomplete  Culture, blood (routine x 2) Call MD if unable to obtain prior to antibiotics being given     Status: None (Preliminary result)   Collection Time: 08/08/15  1:10 PM  Result Value Ref Range Status   Specimen Description   Final    BLOOD RIGHT ARM Performed at [facility]    Special Requests   Final    IN PEDIATRIC BOTTLE  2CC Performed at [facility]    Culture   Final    NO GROWTH 2 DAYS Performed at [facility]    Report Status PENDING  Incomplete         Radiology Studies: Dg Chest 2 View  08/09/2015  CLINICAL DATA:  Followup  pneumonia EXAM: CHEST  2 VIEW COMPARISON:  08/08/2015 FINDINGS: Small to moderate left and minimal right pleural effusions. There is additional airspace opacity at the left lung base consistent with pneumonia. Atelectasis is possible. There is prominence of the right peritracheal stripe which is stable, vascular in origin based on a prior CTA dated 08/13/2014. Mild linear atelectasis is noted at the right lung base. Remainder of the lungs is clear.  No evidence of pulmonary edema. Cardiac silhouette is mildly enlarged. No mediastinal or hilar masses or convincing adenopathy. IMPRESSION: 1. Persistent left lung base opacity representing a combination of a small to moderate pleural effusion with associated pneumonia or atelectasis. 2. Minimal right pleural effusion with minor right lung base atelectasis. 3. Mild cardiomegaly.  No pulmonary edema. Electronically Signed   By: Amie Portland M.D.   On: 08/09/2015 09:48   Dg Esophagus  08/10/2015  CLINICAL DATA:  Difficulty swallowing solid food for the past couple of years. Food feels like it gets stuck in the proximal esophagus. EXAM: ESOPHOGRAM/BARIUM SWALLOW TECHNIQUE: Single contrast examination was performed using  thin barium. FLUOROSCOPY TIME:  Radiation Exposure Index (as provided by the fluoroscopic device): 7.9 mGy If the device does not provide the exposure index: Fluoroscopy Time:  dictate in minutes and seconds Number of Acquired Images:  0 COMPARISON:  None. FINDINGS: A limited examination was performed in the recumbent LPO position due to patient condition. There is lack of primary peristalsis with tertiary contractions and stasis of contrast in the esophagus. No esophageal fold thickening, stricture or obstruction. A 13 mm barium tablet passed into the stomach with the aid of thin barium. IMPRESSION: Poor esophageal motility. Electronically Signed   By: Leanna Battles M.D.   On: 08/10/2015 11:04        Scheduled Meds: . amiodarone  200 mg Oral  Daily  . amitriptyline  75 mg Oral QHS  . amLODipine  5 mg Oral Daily  . antiseptic oral rinse  7 mL Mouth Rinse q12n4p  . aspirin  325 mg Oral Daily  . ceFEPime (MAXIPIME) IV  1 g Intravenous Q12H  . chlorhexidine  15 mL Mouth Rinse BID  . enoxaparin (LOVENOX) injection  40 mg Subcutaneous Q24H  . famotidine  20 mg Oral QHS  . [START ON 08/11/2015] fluticasone  2 spray Each Nare Daily  . guaiFENesin  600 mg Oral BID  . levothyroxine  50 mcg Oral QAC breakfast  . methylPREDNISolone (SOLU-MEDROL) injection  60 mg Intravenous Q6H  . mirtazapine  7.5 mg Oral QHS  . PARoxetine  40 mg Oral QHS  . saccharomyces boulardii  250 mg Oral BID  . senna-docusate  1 tablet Oral BID  . traZODone  100 mg Oral QHS  . vancomycin  500 mg Intravenous Q12H   Continuous Infusions:    LOS: 2 days    Time spent: 35 minutes   Wania Longstreth, MD PhD [facility] Pager [phone]  If 7PM-7AM, please contact night-coverage [email or web address] Password [facility] 08/10/2015, 4:56 PM

## 2015-08-10 NOTE — Progress Notes (Signed)
Physical Therapy Treatment Patient Details Name: Paula Bird MRN: [id number] DOB: 1926-03-20 Today's Date: 08/10/2015   SATURATION QUALIFICATIONS: (This note is used to comply with regulatory documentation for home oxygen)  Patient Saturations on Room Air at Rest = 92%  Patient Saturations on Room Air while Ambulating = 85%  Patient Saturations on 2 Liters of oxygen while Ambulating = 89%    History of Present Illness 82 yo female admitted with HCAP.     PT Comments    Progressing with mobility. Discussed d/c plan-pt states she wants to return to ALF with HHPT follow up.   Follow Up Recommendations  Home health PT (at ALF. Pt states she does not want to go to rehab.)     Equipment Recommendations  None recommended by PT    Recommendations for Other Services       Precautions / Restrictions Precautions Precautions: Fall Precaution Comments: monitor O2 sats Restrictions Weight Bearing Restrictions: No    Mobility  Bed Mobility Overal bed mobility: Needs Assistance Bed Mobility: Supine to Sit     Supine to sit: Supervision;HOB elevated     General bed mobility comments: for safety  Transfers Overall transfer level: Needs assistance Equipment used: Rolling walker (2 wheeled) Transfers: Sit to/from Stand Sit to Stand: Min guard         General transfer comment: close guard for safety. VCs safety, hand placement  Ambulation/Gait Ambulation/Gait assistance: Min assist Ambulation Distance (Feet): 115 Feet Assistive device: Rolling walker (2 wheeled) Gait Pattern/deviations: Step-through pattern;Trunk flexed     General Gait Details: Assist to stabilize. One instance of LE instability. O2 sats 85% on RA, 89% 2L O2 during ambulation. dyspnea 3/4. Very fatigued after walk.    Stairs            Wheelchair Mobility    Modified Rankin (Stroke Patients Only)       Balance                                    Cognition  Arousal/Alertness: Awake/alert Behavior During Therapy: WFL for tasks assessed/performed Overall Cognitive Status: Within Functional Limits for tasks assessed                      Exercises      General Comments        Pertinent Vitals/Pain Pain Assessment: No/denies pain    Home Living                      Prior Function            PT Goals (current goals can now be found in the care plan section) Progress towards PT goals: Progressing toward goals    Frequency  Min 3X/week    PT Plan Current plan remains appropriate    Co-evaluation             End of Session Equipment Utilized During Treatment: Oxygen Activity Tolerance: Patient limited by fatigue Patient left: in chair;with call bell/phone within reach;with family/visitor present     Time: 1442-1500 PT Time Calculation (min) (ACUTE ONLY): 18 min  Charges:  $Gait Training: 8-22 mins                    G Codes:      Paula Bird, MPT Pager: [phone]

## 2015-08-11 ENCOUNTER — Inpatient Hospital Stay (HOSPITAL_COMMUNITY): Payer: Medicare Other

## 2015-08-11 LAB — COMPREHENSIVE METABOLIC PANEL
ALK PHOS: 47 U/L (ref 38–126)
ALT: 16 U/L (ref 14–54)
ANION GAP: 4 — AB (ref 5–15)
AST: 14 U/L — ABNORMAL LOW (ref 15–41)
Albumin: 2.6 g/dL — ABNORMAL LOW (ref 3.5–5.0)
BILIRUBIN TOTAL: 0.2 mg/dL — AB (ref 0.3–1.2)
BUN: 33 mg/dL — ABNORMAL HIGH (ref 6–20)
CALCIUM: 8.1 mg/dL — AB (ref 8.9–10.3)
CO2: 27 mmol/L (ref 22–32)
Chloride: 106 mmol/L (ref 101–111)
Creatinine, Ser: 0.45 mg/dL (ref 0.44–1.00)
GFR calc non Af Amer: >60 mL/min (ref >60)
GLUCOSE: 161 mg/dL — AB (ref 65–99)
Potassium: 3.6 mmol/L (ref 3.5–5.1)
Sodium: 137 mmol/L (ref 135–145)
TOTAL PROTEIN: 5.6 g/dL — AB (ref 6.5–8.1)

## 2015-08-11 LAB — CBC
HEMATOCRIT: 34.9 % — AB (ref 36.0–46.0)
HEMOGLOBIN: 11 g/dL — AB (ref 12.0–15.0)
MCH: 25.9 pg — AB (ref 26.0–34.0)
MCHC: 31.5 g/dL (ref 30.0–36.0)
MCV: 82.1 fL (ref 78.0–100.0)
Platelets: 147 10*3/uL — ABNORMAL LOW (ref 150–400)
RBC: 4.25 MIL/uL (ref 3.87–5.11)
RDW: 15.5 % (ref 11.5–15.5)
WBC: 8.9 10*3/uL (ref 4.0–10.5)

## 2015-08-11 LAB — LACTIC ACID, PLASMA: Lactic Acid, Venous: 1.8 mmol/L (ref 0.5–2.0)

## 2015-08-11 LAB — MAGNESIUM: MAGNESIUM: 2.4 mg/dL (ref 1.7–2.4)

## 2015-08-11 MED ORDER — POTASSIUM CHLORIDE CRYS ER 20 MEQ PO TBCR
40.0000 meq | EXTENDED_RELEASE_TABLET | Freq: Once | ORAL | Status: AC
  Administered 2015-08-11: 40 meq via ORAL
  Filled 2015-08-11: qty 2

## 2015-08-11 MED ORDER — FUROSEMIDE 10 MG/ML IJ SOLN
40.0000 mg | Freq: Every day | INTRAMUSCULAR | Status: DC
  Administered 2015-08-12 – 2015-08-13 (×2): 40 mg via INTRAVENOUS
  Filled 2015-08-11 (×2): qty 4

## 2015-08-11 MED ORDER — FUROSEMIDE 10 MG/ML IJ SOLN
40.0000 mg | Freq: Once | INTRAMUSCULAR | Status: AC
  Administered 2015-08-11: 40 mg via INTRAVENOUS
  Filled 2015-08-11: qty 4

## 2015-08-11 NOTE — Care Management Note (Signed)
Case Management Note  Patient Details  Name: Paula Bird MRN: [id number] Date of Birth: 1925/07/09  Subjective/Objective: Noted 02 sats qualify for home 02-these results are good for 2 days,the update needed. [facility] dme rep Lecretia aware & following. Await home 02 order w/liter flow/freq/duration within 2days of d/c.[facility] has own HHPT-they will provide. CSW following for d/c back to ALF.                   Action/Plan:d/c ALF   Expected Discharge Date:   (UNKNOWN)               Expected Discharge Plan:  Assisted Living / Rest Home  In-House Referral:  Clinical Social Work  Discharge planning Services  CM Consult  Post Acute Care Choice:    Choice offered to:     DME Arranged:    DME Agency:     HH Arranged:    HH Agency:     Status of Service:  In process, will continue to follow  Medicare Important Message Given:  Yes Date Medicare IM Given:    Medicare IM give by:    Date Additional Medicare IM Given:    Additional Medicare Important Message give by:     If discussed at Long Length of Stay Meetings, dates discussed:    Additional Comments:  Lanier ClamMahabir, Red[place] Cloe, RN 08/11/2015, 3:09 PM

## 2015-08-11 NOTE — Care Management Important Message (Signed)
Important Message  Patient Details  Name: Paula Bird MRN: [id number] Date of Birth: Aug 28, 1925   Medicare Important Message Given:  Yes    Haskell FlirtJamison, Seger Jani 08/11/2015, 8:54 AMImportant Message  Patient Details  Name: Paula Bird MRN: [id number] Date of Birth: Aug 28, 1925   Medicare Important Message Given:  Yes    Haskell FlirtJamison, Surya Folden 08/11/2015, 8:54 AM

## 2015-08-11 NOTE — Progress Notes (Signed)
PROGRESS NOTE    Paula Bird  ZOX:[id number] DOB: [birth year] DOA: 08/08/2015 PCP: Paula JennyRIPP, [name], MD  Brief Narrative: 82 year old female with a history of cognitive impairment, current resident at skilled nursing facility, recently discharged from my service on 08/06/2015 at which time she was treated for COPD exacerbation. Chest x-ray performed on 08/04/2015 did not show acute cardiopulmonary disease. She showed improvement was discharged back to her skilled nursing facility. She came back to the emergency department on 08/08/2015 with worsening shortness of breath cough and increased sputum production. Chest x-ray now showing developing consolidation involving left lung base. She was admitted for healthcare associated pneumonia, on broad-spectrum IV antimicrobial therapy  Assessment & Plan:   Principal Problem:   HCAP (healthcare-associated pneumonia) Active Problems:   Hypothyroidism   Respiratory distress   Essential hypertension   COPD exacerbation (HCC)   1.  Healthcare associated pneumonia vs aspiration pna, acute hypoxia respiratory failure -[name] recently discharged from the hospital on 08/06/2015 at which time she was treated for a COPD exacerbation. X-ray during that hospitalization did not reveal acute cardiopulmonary disease. -She presented with increasing cough, shortness of breath, sputum production. X-ray performed in the emergency room showing development of left lung opacity -Plan to continue empiric IV antibiotic therapy with cefepime and vancomycin -Sputum and blood cultures are pending -Continue supportive care. May need to discharge on oxygen, trial of lasix, repeat cxr  2.  COPD exacerbation -On initial evaluation had diffuse expiratory wheezes, rhonchi, dyspnea at rest, denies history of long term smoking or second hand smoking, but did report having exacerbations and bronchitic yearly, was treated with steroids in the past. -Suspect related to COPD  exacerbation precipitated by infectious process -She was started on centimeter 60 mg IV every 6 hours along with nebulizer treatments every 4 hours -Continue systemic steroids, nebulizer treatments, IV antimicrobials, no wheezing on 5/17 but consistent cough, upper airway congestion  3.  Paroxysmal atrial fibrillation -She is not anticoagulated due to h/o frequent falls -EKG on presentation showing sinus rhythm -Continue amiodarone 20 g by mouth daily  4.  Hypertension -Continue amlodipine 5 mg by mouth daily -Blood pressures controlled  5. Anxiety: continue home meds   DVT prophylaxis: Lovenox Code Status: DO NOT RESUSCITATE Family Communication: my college Dr Marguerite OleaZameoa  spoke to her son who is her healthcare power of attorney over telephone conversation, i talked to daughter [name] in room on 5/17 Disposition Plan:  discharge to skilled nursing facility in the next 48-72 hours  Antimicrobials:   Vancomycin started on 08/08/2015  Cefepime started on 08/08/2015   Subjective: Persistent cough, non productive, on oxygen supplement,   Objective: Filed Vitals:   08/11/15 0632 08/11/15 0911 08/11/15 1412 08/11/15 1435  BP: 130/100  136/56   Pulse: 73  77   Temp: 97.8 F (36.6 C)  98.1 F (36.7 C)   TempSrc: Oral  Oral   Resp: 24  18   Height:      Weight:      SpO2: 97% 94% 95% 97%    Intake/Output Summary (Last 24 hours) at 08/11/15 1714 Last data filed at 08/11/15 0700  Gross per 24 hour  Intake    650 ml  Output      0 ml  Net    650 ml   Filed Weights   08/08/15 0642 08/08/15 1243  Weight: 63.05 kg (139 lb) 62.9 kg (138 lb 10.7 oz)    Examination:  General exam: frail, constant cough during evaluation Respiratory system:  intermittent wheezing, + rhonchi and upper airway sounds, no rales, Cardiovascular system: S1 & S2 heard, RRR. No JVD, murmurs, rubs, gallops or clicks. No pedal edema. Gastrointestinal system: Abdomen is nondistended, soft and nontender. No  organomegaly or masses felt. Normal bowel sounds heard. Central nervous system: Alert and oriented. No focal neurological deficits. Extremities: Symmetric 5 x 5 power. Skin: No rashes, lesions or ulcers Psychiatry: Judgement and insight appear normal. Mood & affect appropriate.     Data Reviewed: I have personally reviewed following labs and imaging studies  CBC:  Recent Labs Lab 08/05/15 0520 08/06/15 0519 08/08/15 0101 08/10/15 0454 08/11/15 0523  WBC 4.7 11.6* 7.7 9.4 8.9  HGB 11.0* 11.0* 11.8* 10.7* 11.0*  HCT 35.2* 35.2* 37.4 34.1* 34.9*  MCV 83.6 82.8 83.7 81.2 82.1  PLT PLATELET CLUMPS NOTED ON SMEAR, UNABLE TO ESTIMATE 122* 131* PLATELET CLUMPS NOTED ON SMEAR, UNABLE TO ESTIMATE 147*   Basic Metabolic Panel:  Recent Labs Lab 08/05/15 0520 08/06/15 0519 08/08/15 0101 08/10/15 0454 08/11/15 0523  NA 141 140 138 139 137  K 3.4* 3.8 3.6 3.7 3.6  CL 105 105 101 108 106  CO2 24 27 31 26 27   GLUCOSE 199* 164* 116* 145* 161*  BUN 30* 27* 26* 31* 33*  CREATININE 0.55 0.45 0.58 0.43* 0.45  CALCIUM 8.8* 8.9 8.6* 8.2* 8.1*  MG  --   --   --   --  2.4   GFR: Estimated Creatinine Clearance: 43.2 mL/min (by C-G formula based on Cr of 0.45). Liver Function Tests:  Recent Labs Lab 08/11/15 0523  AST 14*  ALT 16  ALKPHOS 47  BILITOT 0.2*  PROT 5.6*  ALBUMIN 2.6*   No results for input(s): LIPASE, AMYLASE in the last 168 hours. No results for input(s): AMMONIA in the last 168 hours. Coagulation Profile: No results for input(s): INR, PROTIME in the last 168 hours. Cardiac Enzymes:  Recent Labs Lab 08/08/15 0101  TROPONINI <0.03   BNP (last 3 results) No results for input(s): PROBNP in the last 8760 hours. HbA1C: No results for input(s): HGBA1C in the last 72 hours. CBG:  Recent Labs Lab 08/05/15 0736 08/06/15 0748  GLUCAP 153* 148*   Lipid Profile: No results for input(s): CHOL, HDL, LDLCALC, TRIG, CHOLHDL, LDLDIRECT in the last 72 hours. Thyroid  Function Tests: No results for input(s): TSH, T4TOTAL, FREET4, T3FREE, THYROIDAB in the last 72 hours. Anemia Panel: No results for input(s): VITAMINB12, FOLATE, FERRITIN, TIBC, IRON, RETICCTPCT in the last 72 hours. Sepsis Labs:  Recent Labs Lab 08/04/15 2011 08/06/15 0519 08/08/15 0112 08/11/15 0523  PROCALCITON <0.10 0.21  --   --   LATICACIDVEN  --   --  2.1* 1.8    Recent Results (from the past 240 hour(s))  MRSA PCR Screening     Status: None   Collection Time: 08/05/15  6:23 AM  Result Value Ref Range Status   MRSA by PCR NEGATIVE NEGATIVE Final    Comment:        The GeneXpert MRSA Assay (FDA approved for NASAL specimens only), is one component of a comprehensive MRSA colonization surveillance program. It is not intended to diagnose MRSA infection nor to guide or monitor treatment for MRSA infections.   Culture, blood (routine x 2) Call MD if unable to obtain prior to antibiotics being given     Status: None (Preliminary result)   Collection Time: 08/08/15  1:04 PM  Result Value Ref Range Status   Specimen Description  Final    BLOOD LEFT HAND Performed at [facility]    Special Requests   Final    BOTTLES DRAWN AEROBIC AND ANAEROBIC BLUE 5CC RED 3CC   Culture   Final    NO GROWTH 3 DAYS Performed at [facility]    Report Status PENDING  Incomplete  Culture, blood (routine x 2) Call MD if unable to obtain prior to antibiotics being given     Status: None (Preliminary result)   Collection Time: 08/08/15  1:10 PM  Result Value Ref Range Status   Specimen Description   Final    BLOOD RIGHT ARM Performed at [facility]    Special Requests   Final    IN PEDIATRIC BOTTLE  2CC Performed at [facility]    Culture   Final    NO GROWTH 3 DAYS Performed at [facility]    Report Status PENDING  Incomplete  Culture, sputum-assessment     Status: None   Collection Time: 08/10/15  7:14 PM   Result Value Ref Range Status   Specimen Description SPU  Final   Special Requests Normal  Final   Sputum evaluation THIS SPECIMEN IS ACCEPTABLE FOR SPUTUM CULTURE  Final   Report Status 08/10/2015 FINAL  Final  Culture, respiratory (NON-Expectorated)     Status: None (Preliminary result)   Collection Time: 08/10/15  7:14 PM  Result Value Ref Range Status   Specimen Description SPUTUM  Final   Special Requests NONE  Final   Gram Stain   Final    FEW WBC PRESENT, PREDOMINANTLY PMN RARE SQUAMOUS EPITHELIAL CELLS PRESENT FEW GRAM POSITIVE COCCI IN PAIRS IN CLUSTERS Performed at [facility]    Culture PENDING  Incomplete   Report Status PENDING  Incomplete         Radiology Studies: Dg Esophagus  08/10/2015  CLINICAL DATA:  Difficulty swallowing solid food for the past couple of years. Food feels like it gets stuck in the proximal esophagus. EXAM: ESOPHOGRAM/BARIUM SWALLOW TECHNIQUE: Single contrast examination was performed using  thin barium. FLUOROSCOPY TIME:  Radiation Exposure Index (as provided by the fluoroscopic device): 7.9 mGy If the device does not provide the exposure index: Fluoroscopy Time:  dictate in minutes and seconds Number of Acquired Images:  0 COMPARISON:  None. FINDINGS: A limited examination was performed in the recumbent LPO position due to patient condition. There is lack of primary peristalsis with tertiary contractions and stasis of contrast in the esophagus. No esophageal fold thickening, stricture or obstruction. A 13 mm barium tablet passed into the stomach with the aid of thin barium. IMPRESSION: Poor esophageal motility. Electronically Signed   By: Leanna Battles M.D.   On: 08/10/2015 11:04        Scheduled Meds: . amiodarone  200 mg Oral Daily  . amitriptyline  75 mg Oral QHS  . amLODipine  5 mg Oral Daily  . antiseptic oral rinse  7 mL Mouth Rinse q12n4p  . aspirin  325 mg Oral Daily  . ceFEPime (MAXIPIME) IV  1 g Intravenous Q12H  .  chlorhexidine  15 mL Mouth Rinse BID  . enoxaparin (LOVENOX) injection  40 mg Subcutaneous Q24H  . famotidine  20 mg Oral QHS  . fluticasone  2 spray Each Nare Daily  . [START ON 08/12/2015] furosemide  40 mg Intravenous Daily  . guaiFENesin  600 mg Oral BID  . ipratropium-albuterol  3 mL Nebulization TID  .  levothyroxine  50 mcg Oral QAC breakfast  . methylPREDNISolone (SOLU-MEDROL) injection  60 mg Intravenous Q6H  . mirtazapine  7.5 mg Oral QHS  . PARoxetine  40 mg Oral QHS  . saccharomyces boulardii  250 mg Oral BID  . senna-docusate  1 tablet Oral BID  . traZODone  100 mg Oral QHS   Continuous Infusions:    LOS: 3 days    Time spent: 35 minutes   Salimatou Simone, MD PhD [facility] Pager [phone]  If 7PM-7AM, please contact night-coverage [email or web address] Password [email or web address] 08/11/2015, 5:14 PM

## 2015-08-11 NOTE — Progress Notes (Signed)
CSW following for [facility] when medically ready. CSW has completed FL2 & will continue to follow and assist with return. Per RNCM, Olegario MessierKathy - patient will need oxygen at discharge.    Lincoln MaxinKelly Willa Brocks, LCSW [facility] Clinical Social Worker cell #: [phone]

## 2015-08-12 DIAGNOSIS — J9601 Acute respiratory failure with hypoxia: Secondary | ICD-10-CM

## 2015-08-12 LAB — BASIC METABOLIC PANEL
ANION GAP: 6 (ref 5–15)
BUN: 40 mg/dL — ABNORMAL HIGH (ref 6–20)
CALCIUM: 8.1 mg/dL — AB (ref 8.9–10.3)
CHLORIDE: 106 mmol/L (ref 101–111)
CO2: 27 mmol/L (ref 22–32)
CREATININE: 0.49 mg/dL (ref 0.44–1.00)
GFR calc Af Amer: >60 mL/min (ref >60)
GFR calc non Af Amer: >60 mL/min (ref >60)
GLUCOSE: 158 mg/dL — AB (ref 65–99)
POTASSIUM: 4.1 mmol/L (ref 3.5–5.1)
Sodium: 139 mmol/L (ref 135–145)

## 2015-08-12 LAB — HEMOGLOBIN A1C
Hgb A1c MFr Bld: 6 % — ABNORMAL HIGH (ref 4.8–5.6)
MEAN PLASMA GLUCOSE: 126 mg/dL

## 2015-08-12 LAB — TSH: TSH: 0.04 u[IU]/mL — AB (ref 0.350–4.500)

## 2015-08-12 LAB — MAGNESIUM: MAGNESIUM: 2.4 mg/dL (ref 1.7–2.4)

## 2015-08-12 MED ORDER — METHYLPREDNISOLONE SODIUM SUCC 125 MG IJ SOLR
60.0000 mg | Freq: Two times a day (BID) | INTRAMUSCULAR | Status: DC
  Administered 2015-08-13 (×2): 60 mg via INTRAVENOUS
  Filled 2015-08-12 (×2): qty 2

## 2015-08-12 NOTE — Progress Notes (Signed)
Pharmacy Antibiotic Note  Paula Bird is a 82 y.o. female admitted on 08/08/2015 with pneumonia.  Patient is from NH, recently received doses of Levaquin for acute bronchitis ([facility], was not discharged on abx) but symptoms of SOB worsened and new CXR shows developing infiltrate.  She's currently on abx day #6 for PNA.  - 5/18 CXR: Some improvement in a left pleural effusion and basilar airspace disease - afeb, wbc wnl, scr stable (crcl~43)   Plan: - Cefepime 1g IV q12h.  _______________________  Height: 5\' 2"  (157.5 cm) (Simultaneous filing. User may not have seen previous data.) Weight: 138 lb 10.7 oz (62.9 kg) IBW/kg (Calculated) : 50.1  Temp (24hrs), Avg:98.2 F (36.8 C), Min:98.1 F (36.7 C), Max:98.4 F (36.9 C)   Recent Labs Lab 08/06/15 0519 08/08/15 0101 08/08/15 0112 08/10/15 0454 08/11/15 0523 08/12/15 0531  WBC 11.6* 7.7  --  9.4 8.9  --   CREATININE 0.45 0.58  --  0.43* 0.45 0.49  LATICACIDVEN  --   --  2.1*  --  1.8  --     Estimated Creatinine Clearance: 43.2 mL/min (by C-G formula based on Cr of 0.49).    No Known Allergies  Antimicrobials this admission: 5/15 Vancomycin >>  5/17 5/15 Cefepime >>   Microbiology results: 5/12 MRSA PCR: neg 5/15 BCx x2: ngtd 5/15 strep pneumo ur ag: pending 5/16 HIV antib: NR 5/17 sputum: few GPC in pairs, clusters   Thank you for allowing pharmacy to be a part of this patient's care.   Dorna LeitzAnh Donnamae Muilenburg, PharmD, BCPS 08/12/2015 8:37 AM

## 2015-08-12 NOTE — Progress Notes (Signed)
PT Cancellation Note  Patient Details Name: Lorenza Evangelistvelyn Cong MRN: [id number] DOB: 05-06-1925   Cancelled Treatment:    Reason Eval/Treat Not Completed: Attempted tx session-pt declined to participate at this time. Will check back as schedule allows. Thanks.    Rebeca AlertJannie Tinnie Kunin, MPT Pager: [phone]

## 2015-08-12 NOTE — Progress Notes (Signed)
PROGRESS NOTE    Paula Bird  ZOX:[id number] DOB: February 24, 1926 DOA: 08/08/2015 PCP: [name], MD  Brief Narrative: 82 year old female with a history of cognitive impairment, current resident at skilled nursing facility, recently discharged from my service on 08/06/2015 at which time she was treated for COPD exacerbation. Chest x-ray performed on 08/04/2015 did not show acute cardiopulmonary disease. She showed improvement was discharged back to her skilled nursing facility. She came back to the emergency department on 08/08/2015 with worsening shortness of breath cough and increased sputum production. Chest x-ray now showing developing consolidation involving left lung base. She was admitted for healthcare associated pneumonia, on broad-spectrum IV antimicrobial therapy  Assessment & Plan:   Principal Problem:   HCAP (healthcare-associated pneumonia) Active Problems:   Hypothyroidism   Respiratory distress   Essential hypertension   COPD exacerbation (HCC)   1.  Healthcare associated pneumonia vs aspiration pna, acute hypoxia respiratory failure -Paula Bird recently discharged from the hospital on 08/06/2015 at which time she was treated for a COPD exacerbation. X-ray during that hospitalization did not reveal acute cardiopulmonary disease. -She presented with increasing cough, shortness of breath, sputum production. X-ray performed in the emergency room showing development of left lung opacity -Plan to continue empiric IV antibiotic therapy with cefepime and vancomycin -Sputum and blood cultures are pending -Continue supportive care. May need to discharge on oxygen, continue lasix, repeat cxr showed improvement  2.  COPD exacerbation -On initial evaluation had diffuse expiratory wheezes, rhonchi, dyspnea at rest, denies history of long term smoking or second hand smoking, but did report having exacerbations and bronchitic yearly, was treated with steroids in the past. -Suspect  related to COPD exacerbation precipitated by infectious process -- consistent cough, upper airway congestion, but less wheezing, taper systemic steroids, continue nebulizer treatments, IV antimicrobials,   3.  Paroxysmal atrial fibrillation -She is not anticoagulated due to h/o frequent falls -EKG on presentation showing sinus rhythm -Continue amiodarone 20 g by mouth daily  4.  Hypertension -Continue amlodipine 5 mg by mouth daily -Blood pressures controlled  5. Anxiety: continue home meds   DVT prophylaxis: Lovenox Code Status: DO NOT RESUSCITATE Family Communication: my college Dr Marguerite Olea  spoke to her son who is her healthcare power of attorney over telephone conversation, i talked to daughter Paula Bird in room on 5/17 Disposition Plan:  discharge to skilled nursing facility in the next 48-72 hours  Antimicrobials:   Vancomycin started on 08/08/2015, stopped on 5/17  Cefepime started on 08/08/2015   Subjective: Persistent cough, less wheezing, on oxygen supplement,   Objective: Filed Vitals:   08/12/15 0827 08/12/15 1016 08/12/15 1314 08/12/15 1432  BP:  140/60  134/52  Pulse: 79  82 82  Temp:    97.8 F (36.6 C)  TempSrc:    Oral  Resp: Height:      Weight:      SpO2: 96%  95% 96%    Intake/Output Summary (Last 24 hours) at 08/12/15 2114 Last data filed at 08/12/15 0700  Gross per 24 hour  Intake    100 ml  Output      0 ml  Net    100 ml   Filed Weights   08/08/15 0642 08/08/15 1243  Weight: 63.05 kg (139 lb) 62.9 kg (138 lb 10.7 oz)    Examination:  General exam: frail, constant cough during evaluation Respiratory system: intermittent wheezing, + rhonchi and upper airway sounds, no rales, Cardiovascular system: S1 & S2 heard,  RRR. No JVD, murmurs, rubs, gallops or clicks. No pedal edema. Gastrointestinal system: Abdomen is nondistended, soft and nontender. No organomegaly or masses felt. Normal bowel sounds heard. Central nervous system: Alert  and oriented. No focal neurological deficits. Extremities: Symmetric 5 x 5 power. Skin: No rashes, lesions or ulcers Psychiatry: Judgement and insight appear normal. Mood & affect appropriate.     Data Reviewed: I have personally reviewed following labs and imaging studies  CBC:  Recent Labs Lab 08/06/15 0519 08/08/15 0101 08/10/15 0454 08/11/15 0523  WBC 11.6* 7.7 9.4 8.9  HGB 11.0* 11.8* 10.7* 11.0*  HCT 35.2* 37.4 34.1* 34.9*  MCV 82.8 83.7 81.2 82.1  PLT 122* 131* PLATELET CLUMPS NOTED ON SMEAR, UNABLE TO ESTIMATE 147*   Basic Metabolic Panel:  Recent Labs Lab 08/06/15 0519 08/08/15 0101 08/10/15 0454 08/11/15 0523 08/12/15 0531  NA 140 138 139 137 139  K 3.8 3.6 3.7 3.6 4.1  CL 105 101 108 106 106  CO2 27 31 26 27 27   GLUCOSE 164* 116* 145* 161* 158*  BUN 27* 26* 31* 33* 40*  CREATININE 0.45 0.58 0.43* 0.45 0.49  CALCIUM 8.9 8.6* 8.2* 8.1* 8.1*  MG  --   --   --  2.4 2.4   GFR: Estimated Creatinine Clearance: 43.2 mL/min (by C-G formula based on Cr of 0.49). Liver Function Tests:  Recent Labs Lab 08/11/15 0523  AST 14*  ALT 16  ALKPHOS 47  BILITOT 0.2*  PROT 5.6*  ALBUMIN 2.6*   No results for input(s): LIPASE, AMYLASE in the last 168 hours. No results for input(s): AMMONIA in the last 168 hours. Coagulation Profile: No results for input(s): INR, PROTIME in the last 168 hours. Cardiac Enzymes:  Recent Labs Lab 08/08/15 0101  TROPONINI <0.03   BNP (last 3 results) No results for input(s): PROBNP in the last 8760 hours. HbA1C:  Recent Labs  08/11/15 0523  HGBA1C 6.0*   CBG:  Recent Labs Lab 08/06/15 0748  GLUCAP 148*   Lipid Profile: No results for input(s): CHOL, HDL, LDLCALC, TRIG, CHOLHDL, LDLDIRECT in the last 72 hours. Thyroid Function Tests:  Recent Labs  08/12/15 0531  TSH 0.040*   Anemia Panel: No results for input(s): VITAMINB12, FOLATE, FERRITIN, TIBC, IRON, RETICCTPCT in the last 72 hours. Sepsis  Labs:  Recent Labs Lab 08/06/15 0519 08/08/15 0112 08/11/15 0523  PROCALCITON 0.21  --   --   LATICACIDVEN  --  2.1* 1.8    Recent Results (from the past 240 hour(s))  MRSA PCR Screening     Status: None   Collection Time: 08/05/15  6:23 AM  Result Value Ref Range Status   MRSA by PCR NEGATIVE NEGATIVE Final    Comment:        The GeneXpert MRSA Assay (FDA approved for NASAL specimens only), is one component of a comprehensive MRSA colonization surveillance program. It is not intended to diagnose MRSA infection nor to guide or monitor treatment for MRSA infections.   Culture, blood (routine x 2) Call MD if unable to obtain prior to antibiotics being given     Status: None (Preliminary result)   Collection Time: 08/08/15  1:04 PM  Result Value Ref Range Status   Specimen Description   Final    BLOOD LEFT HAND [facility]formed [facility]    Special Requests   Final    BOTTLES DRAWN AEROBIC AND ANAEROBIC BLUE 5CC RED 3CC   Culture   Final  NO GROWTH 4 DAYS Performed at [facility]    Report Status PENDING  Incomplete  Culture, blood (routine x 2) Call MD if unable to obtain prior to antibiotics being given     Status: None (Preliminary result)   Collection Time: 08/08/15  1:10 PM  Result Value Ref Range Status   Specimen Description   Final    BLOOD RIGHT ARM Performed at [facility]    Special Requests   Final    IN PEDIATRIC BOTTLE  2CC Performed at [facility]    Culture   Final    NO GROWTH 4 DAYS Performed at [facility]    Report Status PENDING  Incomplete  Culture, sputum-assessment     Status: None   Collection Time: 08/10/15  7:14 PM  Result Value Ref Range Status   Specimen Description SPU  Final   Special Requests Normal  Final   Sputum evaluation THIS SPECIMEN IS ACCEPTABLE FOR SPUTUM CULTURE  Final   Report Status 08/10/2015 FINAL  Final  Culture, respiratory  (NON-Expectorated)     Status: None (Preliminary result)   Collection Time: 08/10/15  7:14 PM  Result Value Ref Range Status   Specimen Description SPUTUM  Final   Special Requests NONE  Final   Gram Stain   Final    FEW WBC PRESENT, PREDOMINANTLY PMN RARE SQUAMOUS EPITHELIAL CELLS PRESENT FEW GRAM POSITIVE COCCI IN PAIRS IN CLUSTERS Performed at [facility]    Culture   Final    Culture reincubated for better growth Performed at [employer] [facility]    Report Status PENDING  Incomplete         Radiology Studies: Dg Chest 2 View  08/11/2015  CLINICAL DATA:  Cough and congestion.  Initial encounter. EXAM: CHEST  2 VIEW COMPARISON:  PA and lateral chest 08/09/2015. FINDINGS: Left pleural effusion and basilar airspace disease are again seen. Aeration appears mildly improved. The right lung remains clear. Heart size is enlarged. No pneumothorax. IMPRESSION: Some improvement in a left pleural effusion and basilar airspace disease. No new abnormality. Electronically Signed   By: Drusilla Kanner M.D.   On: 08/11/2015 18:42        Scheduled Meds: . amiodarone  200 mg Oral Daily  . amitriptyline  75 mg Oral QHS  . amLODipine  5 mg Oral Daily  . antiseptic oral rinse  7 mL Mouth Rinse q12n4p  . aspirin  325 mg Oral Daily  . ceFEPime (MAXIPIME) IV  1 g Intravenous Q12H  . chlorhexidine  15 mL Mouth Rinse BID  . enoxaparin (LOVENOX) injection  40 mg Subcutaneous Q24H  . famotidine  20 mg Oral QHS  . fluticasone  2 spray Each Nare Daily  . furosemide  40 mg Intravenous Daily  . guaiFENesin  600 mg Oral BID  . ipratropium-albuterol  3 mL Nebulization TID  . levothyroxine  50 mcg Oral QAC breakfast  . [START ON 08/13/2015] methylPREDNISolone (SOLU-MEDROL) injection  60 mg Intravenous Q12H  . mirtazapine  7.5 mg Oral QHS  . PARoxetine  40 mg Oral QHS  . saccharomyces boulardii  250 mg Oral BID  . senna-docusate  1 tablet Oral BID  . traZODone  100 mg Oral QHS    Continuous Infusions:    LOS: 4 days    Time spent: 35 minutes   Raeanne Deschler, MD PhD [facility] Pager [phone]  If 7PM-7AM, please contact night-coverage [email or web address] Password [facility] 08/12/2015, 9:14 PM

## 2015-08-12 NOTE — Progress Notes (Signed)
Physical Therapy Treatment Patient Details Name: Paula Bird MRN: [id number] DOB: 05-21-1925 Today's Date: 08/12/2015    History of Present Illness 82 yo female admitted with HCAP.     PT Comments    Progressing slowly with mobility. Ambulated on 2L O2 today-sats 97%. Pt continues to have fatigue and coughing spell after ambulating.   Follow Up Recommendations  Home health PT (at [facility]F-pt declined SNF)     Equipment Recommendations  None recommended by PT    Recommendations for Other Services       Precautions / Restrictions Precautions Precautions: Fall Precaution Comments: monitor O2 sats Restrictions Weight Bearing Restrictions: No    Mobility  Bed Mobility Overal bed mobility: Needs Assistance Bed Mobility: Supine to Sit;Sit to Supine     Supine to sit: Supervision Sit to supine: Supervision   General bed mobility comments: for safety  Transfers Overall transfer level: Needs assistance Equipment used: Rolling walker (2 wheeled) Transfers: Sit to/from Stand Sit to Stand: Min assist         General transfer comment: Assist to stabilize. VCs safety, hand placement  Ambulation/Gait Ambulation/Gait assistance: Min assist Ambulation Distance (Feet): 65 Feet Assistive device: Rolling walker (2 wheeled) Gait Pattern/deviations: Step-through pattern;Decreased stride length;Trunk flexed     General Gait Details: Assist to stabilize. O2 sats 97 % 2L O2 during ambulation. dyspnea 3/4. Fatigue and coughing spell after walk.    Stairs            Wheelchair Mobility    Modified Rankin (Stroke Patients Only)       Balance           Standing balance support: Bilateral upper extremity supported;During functional activity Standing balance-Leahy Scale: Poor                      Cognition Arousal/Alertness: Awake/alert Behavior During Therapy: WFL for tasks assessed/performed Overall Cognitive Status: Within Functional Limits for tasks  assessed                      Exercises      General Comments        Pertinent Vitals/Pain Pain Assessment: No/denies pain    Home Living                      Prior Function            PT Goals (current goals can now be found in the care plan section) Progress towards PT goals: Progressing toward goals    Frequency  Min 3X/week    PT Plan Current plan remains appropriate    Co-evaluation             End of Session Equipment Utilized During Treatment: Oxygen;Gait belt Activity Tolerance: Patient limited by fatigue Patient left: in bed;with call bell/phone within reach;with bed alarm set;with family/visitor present     Time: [id number] PT Time Calculation (min) (ACUTE ONLY): 13 min  Charges:  $Gait Training: 8-22 mins                    G Codes:      Rebeca AlertJannie Tashanti Dalporto, MPT Pager: [phone]

## 2015-08-12 NOTE — Progress Notes (Deleted)
Night of 5/18 to morning of 5/19: Pt used ugly language with RN and CNA. Pt kept taking telemetry monitor off and refusing it during the night. When lab came to draw blood this morning pt was swinging at lab and saying ugly words. When RN and CNA came in to do morning vital signs pt was also swinging at both RN and CNA. RN and CNA charted patient refused. 08/12/2015 6:38 AM Merlene Pullingorcoran, Thornell Muleachel C, RN

## 2015-08-13 DIAGNOSIS — R627 Adult failure to thrive: Secondary | ICD-10-CM

## 2015-08-13 LAB — BASIC METABOLIC PANEL
ANION GAP: 7 (ref 5–15)
BUN: 42 mg/dL — AB (ref 6–20)
CHLORIDE: 104 mmol/L (ref 101–111)
CO2: 29 mmol/L (ref 22–32)
Calcium: 8.1 mg/dL — ABNORMAL LOW (ref 8.9–10.3)
Creatinine, Ser: 0.48 mg/dL (ref 0.44–1.00)
GFR calc Af Amer: >60 mL/min (ref >60)
GFR calc non Af Amer: >60 mL/min (ref >60)
GLUCOSE: 151 mg/dL — AB (ref 65–99)
POTASSIUM: 3.6 mmol/L (ref 3.5–5.1)
Sodium: 140 mmol/L (ref 135–145)

## 2015-08-13 LAB — CULTURE, BLOOD (ROUTINE X 2)
CULTURE: NO GROWTH
Culture: NO GROWTH

## 2015-08-13 LAB — CULTURE, RESPIRATORY

## 2015-08-13 LAB — CULTURE, RESPIRATORY W GRAM STAIN: Culture: NORMAL

## 2015-08-13 MED ORDER — FUROSEMIDE 10 MG/ML IJ SOLN
40.0000 mg | Freq: Two times a day (BID) | INTRAMUSCULAR | Status: DC
  Administered 2015-08-13 – 2015-08-15 (×5): 40 mg via INTRAVENOUS
  Filled 2015-08-13 (×5): qty 4

## 2015-08-13 MED ORDER — LISINOPRIL 10 MG PO TABS
5.0000 mg | ORAL_TABLET | Freq: Every day | ORAL | Status: DC
  Administered 2015-08-13 – 2015-08-16 (×4): 5 mg via ORAL
  Filled 2015-08-13 (×4): qty 1

## 2015-08-13 MED ORDER — PREDNISONE 20 MG PO TABS
60.0000 mg | ORAL_TABLET | Freq: Every day | ORAL | Status: DC
  Administered 2015-08-14: 60 mg via ORAL
  Filled 2015-08-13: qty 3

## 2015-08-13 MED ORDER — HYDRALAZINE HCL 20 MG/ML IJ SOLN: 5.0000 mg | Freq: Three times a day (TID) | INTRAMUSCULAR | Status: DC | PRN

## 2015-08-13 MED ORDER — AMOXICILLIN-POT CLAVULANATE 875-125 MG PO TABS
1.0000 | ORAL_TABLET | Freq: Two times a day (BID) | ORAL | Status: DC
  Administered 2015-08-13 – 2015-08-16 (×7): 1 via ORAL
  Filled 2015-08-13 (×7): qty 1

## 2015-08-13 MED ORDER — POTASSIUM CHLORIDE CRYS ER 20 MEQ PO TBCR
40.0000 meq | EXTENDED_RELEASE_TABLET | Freq: Once | ORAL | Status: AC
  Administered 2015-08-13: 40 meq via ORAL
  Filled 2015-08-13: qty 2

## 2015-08-13 MED ORDER — SENNOSIDES-DOCUSATE SODIUM 8.6-50 MG PO TABS
1.0000 | ORAL_TABLET | Freq: Every day | ORAL | Status: DC
  Filled 2015-08-13: qty 1

## 2015-08-13 NOTE — Progress Notes (Signed)
Pt ambulated to bathroom with tech and walker - pt able to walk to bathroom with steady gail. Upon return to bed, pt reported her bilateral hands became weak and she was no longer able to grip the walker, tech assisted patient to ground on buttocks. Pt denies pain. No pain, bony abnormalities, or protrusions noted upon palpation to spinal and hip areas. However, skin tear present to right forearm, area cleansed and nonstick dressing applied. Notified Dr. Roda ShuttersXu and contacted son, Wynne DustGuy Bessire, and made him aware. Vital signs stable. Pt states she feels anxious due to the steroids she is being given to help her breathing.

## 2015-08-13 NOTE — Progress Notes (Signed)
PROGRESS NOTE    Paula Bird  ZOX:[id number] DOB: February 12, 1926 DOA: 08/08/2015 PCP: [name], MD  Brief Narrative: 82 year old female with a history of cognitive impairment, current resident at skilled nursing facility, recently discharged from my service on 08/06/2015 at which time she was treated for COPD exacerbation. Chest x-ray performed on 08/04/2015 did not show acute cardiopulmonary disease. She showed improvement was discharged back to her skilled nursing facility. She came back to the emergency department on 08/08/2015 with worsening shortness of breath cough and increased sputum production. Chest x-ray now showing developing consolidation involving left lung base. She was admitted for healthcare associated pneumonia, on broad-spectrum IV antimicrobial therapy  Assessment & Plan:   Principal Problem:   HCAP (healthcare-associated pneumonia) Active Problems:   Hypothyroidism   Respiratory distress   Essential hypertension   COPD exacerbation (HCC)   1.  Healthcare associated pneumonia vs aspiration pna, acute hypoxia respiratory failure -Paula Bird recently discharged from the hospital on 08/06/2015 at which time she was treated for a COPD exacerbation. X-ray during that hospitalization did not reveal acute cardiopulmonary disease. -She presented with increasing cough, shortness of breath, sputum production. X-ray performed in the emergency room showing development of left lung opacity --Sputum and blood cultures unrevealing, mrsa screen negative, DG esophagus did showed esophageal dysmotility. regualr diet and thin liquid per speech eval. -she was started on empiric IV antibiotic therapy with cefepime and vancomycin on admission, now abx narrowed to oral augmentin -Continue supportive care. May need to discharge on oxygen, continue lasix, repeat cxr showed improvement  2.  COPD exacerbation -On initial evaluation had diffuse expiratory wheezes, rhonchi, dyspnea at rest,  denies history of long term smoking or second hand smoking, but did report having exacerbations and bronchitic yearly, was treated with steroids in the past. -Suspect related to COPD exacerbation precipitated by infectious process --  cough, upper airway congestion, but less wheezing, taper systemic steroids, continue nebulizer treatments, abx narrowed -wean oxygen and ambulate  3.  Paroxysmal atrial fibrillation -She is not anticoagulated due to h/o frequent falls -EKG on presentation showing sinus rhythm -Continue amiodarone 20 g by mouth daily  4.  Hypertension -Continue amlodipine 5 mg by mouth daily -Blood pressures controlled  5. Anxiety: continue home meds  6. FTT: fall vs assist to floor on 5/20, patient denies pain, continue PT, may need SNF at discharge, check orthostatic vital sign   DVT prophylaxis: Lovenox Code Status: DO NOT RESUSCITATE Family Communication: my college Dr Marguerite Olea  spoke to her son who is her healthcare power of attorney over telephone conversation, i talked to daughter Erskine Squibb in room on 5/17, i talked to patient's son over the phone on 5/20 Disposition Plan:  discharge to skilled nursing facility in the next 48-72 hours  Antimicrobials:   Vancomycin started on 08/08/2015, stopped on 5/17  Cefepime started on 08/08/2015 to 5/20  augmentin from 5/20   Subjective: Patient has a fall vs assist to floor while walking out of bathroom with her walker and nurse assistant by her side. She denies pain. Breathing better, less cough,  less wheezing, on oxygen supplement,   Objective: Filed Vitals:   08/12/15 2133 08/13/15 0456 08/13/15 0745 08/13/15 1100  BP:  149/93 140/79   Pulse: 82 78 114   Temp:  97.7 F (36.5 C)    TempSrc:  Oral    Resp: 18 16    Height:      Weight:      SpO2: 95% 95% 95% 92%  Intake/Output Summary (Last 24 hours) at 08/13/15 1311 Last data filed at 08/13/15 1206  Gross per 24 hour  Intake    665 ml  Output      0 ml   Net    665 ml   Filed Weights   08/08/15 0642 08/08/15 1243  Weight: 63.05 kg (139 lb) 62.9 kg (138 lb 10.7 oz)    Examination:  General exam: frail, less cough during evaluation Respiratory system: intermittent wheezing, + rhonchi and upper airway sounds, no rales, Cardiovascular system: S1 & S2 heard, RRR. No JVD, murmurs, rubs, gallops or clicks. No pedal edema. Gastrointestinal system: Abdomen is nondistended, soft and nontender. No organomegaly or masses felt. Normal bowel sounds heard. Central nervous system: Alert and oriented. No focal neurological deficits. Extremities: Symmetric 5 x 5 power. Skin: No rashes, lesions or ulcers Psychiatry: Judgement and insight appear normal. Mood & affect appropriate.     Data Reviewed: I have personally reviewed following labs and imaging studies  CBC:  Recent Labs Lab 08/08/15 0101 08/10/15 0454 08/11/15 0523  WBC 7.7 9.4 8.9  HGB 11.8* 10.7* 11.0*  HCT 37.4 34.1* 34.9*  MCV 83.7 81.2 82.1  PLT 131* PLATELET CLUMPS NOTED ON SMEAR, UNABLE TO ESTIMATE 147*   Basic Metabolic Panel:  Recent Labs Lab 08/08/15 0101 08/10/15 0454 08/11/15 0523 08/12/15 0531 08/13/15 0458  NA 138 139 137 139 140  K 3.6 3.7 3.6 4.1 3.6  CL 101 108 106 106 104  CO2 31 26 27 27 29   GLUCOSE 116* 145* 161* 158* 151*  BUN 26* 31* 33* 40* 42*  CREATININE 0.58 0.43* 0.45 0.49 0.48  CALCIUM 8.6* 8.2* 8.1* 8.1* 8.1*  MG  --   --  2.4 2.4  --    GFR: Estimated Creatinine Clearance: 43.2 mL/min (by C-G formula based on Cr of 0.48). Liver Function Tests:  Recent Labs Lab 08/11/15 0523  AST 14*  ALT 16  ALKPHOS 47  BILITOT 0.2*  PROT 5.6*  ALBUMIN 2.6*   No results for input(s): LIPASE, AMYLASE in the last 168 hours. No results for input(s): AMMONIA in the last 168 hours. Coagulation Profile: No results for input(s): INR, PROTIME in the last 168 hours. Cardiac Enzymes:  Recent Labs Lab 08/08/15 0101  TROPONINI <0.03   BNP (last 3  results) No results for input(s): PROBNP in the last 8760 hours. HbA1C:  Recent Labs  08/11/15 0523  HGBA1C 6.0*   CBG: No results for input(s): GLUCAP in the last 168 hours. Lipid Profile: No results for input(s): CHOL, HDL, LDLCALC, TRIG, CHOLHDL, LDLDIRECT in the last 72 hours. Thyroid Function Tests:  Recent Labs  08/12/15 0531  TSH 0.040*   Anemia Panel: No results for input(s): VITAMINB12, FOLATE, FERRITIN, TIBC, IRON, RETICCTPCT in the last 72 hours. Sepsis Labs:  Recent Labs Lab 08/08/15 0112 08/11/15 0523  LATICACIDVEN 2.1* 1.8    Recent Results (from the past 240 hour(s))  MRSA PCR Screening     Status: None   Collection Time: 08/05/15  6:23 AM  Result Value Ref Range Status   MRSA by PCR NEGATIVE NEGATIVE Final    Comment:        The GeneXpert MRSA Assay (FDA approved for NASAL specimens only), is one component of a comprehensive MRSA colonization surveillance program. It is not intended to diagnose MRSA infection nor to guide or monitor treatment for MRSA infections.   Culture, blood (routine x 2) Call MD if unable to obtain prior to antibiotics  being given     Status: None   Collection Time: 08/08/15  1:04 PM  Result Value Ref Range Status   Specimen Description   Final    BLOOD LEFT HAND [facility]    Special Requests   Final    BOTTLES DRAWN AEROBIC AND ANAEROBIC BLUE 5CC RED 3CC   Culture   Final    NO GROWTH 5 DAYS [facility]formed at [facility]    Report Status 08/13/2015 FINAL  Final  Culture, blood (routine x 2) Call MD if unable to obtain prior to antibiotics being given     Status: None   Collection Time: 08/08/15  1:10 PM  Result Value Ref Range Status   Specimen Description   Final    BLOOD RIGHT ARM [facility]    Special Requests   Final    IN PEDIATRIC BOTTLE  2CC [facility]formed at [facility]    Culture   Final    NO GROWTH 5  DAYS Performed at [facility]    Report Status 08/13/2015 FINAL  Final  Culture, sputum-assessment     Status: None   Collection Time: 08/10/15  7:14 PM  Result Value Ref Range Status   Specimen Description SPU  Final   Special Requests Normal  Final   Sputum evaluation THIS SPECIMEN IS ACCEPTABLE FOR SPUTUM CULTURE  Final   Report Status 08/10/2015 FINAL  Final  Culture, respiratory (NON-Expectorated)     Status: None   Collection Time: 08/10/15  7:14 PM  Result Value Ref Range Status   Specimen Description SPUTUM  Final   Special Requests NONE  Final   Gram Stain   Final    FEW WBC PRESENT, PREDOMINANTLY PMN RARE SQUAMOUS EPITHELIAL CELLS PRESENT FEW GRAM POSITIVE COCCI IN PAIRS IN CLUSTERS [facility]formed at [facility]    Culture   Final    NORMAL OROPHARYNGEAL FLORA Performed at [facility]    Report Status 08/13/2015 FINAL  Final         Radiology Studies: Dg Chest 2 View  08/11/2015  CLINICAL DATA:  Cough and congestion.  Initial encounter. EXAM: CHEST  2 VIEW COMPARISON:  PA and lateral chest 08/09/2015. FINDINGS: Left pleural effusion and basilar airspace disease are again seen. Aeration appears mildly improved. The right lung remains clear. Heart size is enlarged. No pneumothorax. IMPRESSION: Some improvement in a left pleural effusion and basilar airspace disease. No new abnormality. Electronically Signed   By: Drusilla Kannerhomas  Dalessio M.D.   On: 08/11/2015 18:42        Scheduled Meds: . amiodarone  200 mg Oral Daily  . amitriptyline  75 mg Oral QHS  . amLODipine  5 mg Oral Daily  . antiseptic oral rinse  7 mL Mouth Rinse q12n4p  . aspirin  325 mg Oral Daily  . chlorhexidine  15 mL Mouth Rinse BID  . enoxaparin (LOVENOX) injection  40 mg Subcutaneous Q24H  . famotidine  20 mg Oral QHS  . fluticasone  2 spray Each Nare Daily  . furosemide  40 mg Intravenous BID  . guaiFENesin  600 mg Oral BID  . ipratropium-albuterol  3 mL Nebulization TID   . levothyroxine  50 mcg Oral QAC breakfast  . methylPREDNISolone (SOLU-MEDROL) injection  60 mg Intravenous Q12H  . mirtazapine  7.5 mg Oral QHS  . PARoxetine  40 mg Oral QHS  . potassium chloride  40 mEq Oral Once  .  saccharomyces boulardii  250 mg Oral BID  . senna-docusate  1 tablet Oral BID  . traZODone  100 mg Oral QHS   Continuous Infusions:    LOS: 5 days    Time spent: 35 minutes   Mariesha Venturella, MD PhD [facility] Pager [phone]  If 7PM-7AM, please contact night-coverage [email or web address] Password [email or web address] 08/13/2015, 1:11 PM

## 2015-08-14 ENCOUNTER — Inpatient Hospital Stay (HOSPITAL_COMMUNITY): Payer: Medicare Other

## 2015-08-14 LAB — BASIC METABOLIC PANEL
Anion gap: 6 (ref 5–15)
BUN: 36 mg/dL — AB (ref 6–20)
CHLORIDE: 104 mmol/L (ref 101–111)
CO2: 30 mmol/L (ref 22–32)
CREATININE: 0.43 mg/dL — AB (ref 0.44–1.00)
Calcium: 8.3 mg/dL — ABNORMAL LOW (ref 8.9–10.3)
GFR calc Af Amer: >60 mL/min (ref >60)
GFR calc non Af Amer: >60 mL/min (ref >60)
Glucose, Bld: 125 mg/dL — ABNORMAL HIGH (ref 65–99)
Potassium: 4.6 mmol/L (ref 3.5–5.1)
SODIUM: 140 mmol/L (ref 135–145)

## 2015-08-14 LAB — MAGNESIUM: Magnesium: 2.3 mg/dL (ref 1.7–2.4)

## 2015-08-14 MED ORDER — DILTIAZEM HCL 30 MG PO TABS
30.0000 mg | ORAL_TABLET | Freq: Two times a day (BID) | ORAL | Status: DC
  Administered 2015-08-14 – 2015-08-16 (×4): 30 mg via ORAL
  Filled 2015-08-14 (×4): qty 1

## 2015-08-14 MED ORDER — IPRATROPIUM-ALBUTEROL 0.5-2.5 (3) MG/3ML IN SOLN
3.0000 mL | Freq: Two times a day (BID) | RESPIRATORY_TRACT | Status: DC
  Administered 2015-08-14 – 2015-08-16 (×4): 3 mL via RESPIRATORY_TRACT
  Filled 2015-08-14 (×4): qty 3

## 2015-08-14 MED ORDER — PREDNISONE 50 MG PO TABS
50.0000 mg | ORAL_TABLET | Freq: Every day | ORAL | Status: DC
  Administered 2015-08-15: 50 mg via ORAL
  Filled 2015-08-14: qty 1

## 2015-08-14 NOTE — Progress Notes (Signed)
PROGRESS NOTE    Paula Bird  ZOX:[id number] DOB: 1925/11/20 DOA: 08/08/2015 PCP: [name], [name], MD  Brief Narrative: 82 year old female with a history of cognitive impairment, current resident at skilled nursing facility, recently discharged from my service on 08/06/2015 at which time she was treated for COPD exacerbation. Chest x-ray performed on 08/04/2015 did not show acute cardiopulmonary disease. She showed improvement was discharged back to her skilled nursing facility. She came back to the emergency department on 08/08/2015 with worsening shortness of breath cough and increased sputum production. Chest x-ray now showing developing consolidation involving left lung base. She was admitted for healthcare associated pneumonia, on broad-spectrum IV antimicrobial therapy  Assessment & Plan:   Principal Problem:   HCAP (healthcare-associated pneumonia) Active Problems:   Hypothyroidism   Respiratory distress   Essential hypertension   COPD exacerbation (HCC)   1.  Healthcare associated pneumonia vs aspiration pna, acute hypoxia respiratory failure -Paula Bird recently discharged from the hospital on 08/06/2015 at which time she was treated for a COPD exacerbation. X-ray during that hospitalization did not reveal acute cardiopulmonary disease. -She presented with increasing cough, shortness of breath, sputum production. X-ray performed in the emergency room showing development of left lung opacity --Sputum and blood cultures unrevealing, mrsa screen negative, DG esophagus did showed esophageal dysmotility. regualr diet and thin liquid per speech eval. Compensations: Slow rate;Small sips/bites (start meals with liquids, small meals, consume room temp liquids if helpful) Postural Changes: Seated upright at 90 degrees;Remain upright for at least 30 minutes after po intake , start trial of low dose calcium channel blocker -she was started on empiric IV antibiotic therapy with cefepime and  vancomycin on admission, now abx narrowed to oral augmentin -Continue supportive care. May need to discharge on oxygen, continue lasix, repeat cxr showed improvement -proceed with CT chest due to persistent cough  2.  COPD exacerbation -On initial evaluation had diffuse expiratory wheezes, rhonchi, dyspnea at rest, denies history of long term smoking or second hand smoking, but did report having exacerbations and bronchitic yearly, was treated with steroids in the past. -Suspect related to COPD exacerbation precipitated by infectious process -continued cough, upper airway congestion, wheezing seems has resolved, taper systemic steroids, decrease nebulizer treatments frequency, abx narrowed -wean oxygen and ambulate  3.  Paroxysmal atrial fibrillation -She is not anticoagulated due to h/o frequent falls -EKG on presentation showing sinus rhythm -Continue amiodarone 20 g by mouth daily -she is not on betablocker or calcium channel blocker , trial of low dose ccb, monitor heart rate and bp  4.  Hypertension -discontinue norvasc, now started on low dose calcium channel blocker -Blood pressures controlled  5. Anxiety: continue home meds  6. FTT: fall vs assist to floor on 5/20, patient denies pain, continue PT, need SNF level care at discharge,    DVT prophylaxis: Lovenox Code Status: DO NOT RESUSCITATE Family Communication: my college Dr Marguerite OleaZameoa  spoke to her son who is her healthcare power of attorney over telephone conversation, i talked to daughter [name] in room on 5/17, i talked to patient's son over the phone on 5/20,  I talked to daughter and son in law in room on 5/21 Disposition Plan:  discharge to skilled nursing facility in the next 48-72 hours if ct chest ok and remain off oxygen  Antimicrobials:   Vancomycin started on 08/08/2015, stopped on 5/17  Cefepime started on 08/08/2015 to 5/20  augmentin from 5/20   Subjective: Frail and anxious, persistent intermittent coughing  spells, no wheezing,  on room air, family in room  Objective: Filed Vitals:   08/14/15 0811 08/14/15 1100 08/14/15 1412 08/14/15 1426  BP:  120/68  132/52  Pulse:  80  80  Temp:    98.4 F (36.9 C)  TempSrc:    Oral  Resp:      Height:      Weight:      SpO2: 94% 94% 91% 92%    Intake/Output Summary (Last 24 hours) at 08/14/15 1430 Last data filed at 08/14/15 1200  Gross per 24 hour  Intake   1150 ml  Output   1000 ml  Net    150 ml   Filed Weights   08/08/15 0642 08/08/15 1243  Weight: 63.05 kg (139 lb) 62.9 kg (138 lb 10.7 oz)    Examination:  General exam: frail, anxious, intermittent coughing spells during encounter Respiratory system: no wheezing, + rhonchi and upper airway sounds, no rales, Cardiovascular system: S1 & S2 heard, RRR. No JVD, murmurs, rubs, gallops or clicks. No pedal edema. Gastrointestinal system: Abdomen is nondistended, soft and nontender. No organomegaly or masses felt. Normal bowel sounds heard. Central nervous system: Alert and oriented. No focal neurological deficits. Extremities: Symmetric 5 x 5 power. Skin: No rashes, lesions or ulcers Psychiatry: Judgement and insight appear normal. Mood & affect appropriate.     Data Reviewed: I have personally reviewed following labs and imaging studies  CBC:  Recent Labs Lab 08/08/15 0101 08/10/15 0454 08/11/15 0523  WBC 7.7 9.4 8.9  HGB 11.8* 10.7* 11.0*  HCT 37.4 34.1* 34.9*  MCV 83.7 81.2 82.1  PLT 131* PLATELET CLUMPS NOTED ON SMEAR, UNABLE TO ESTIMATE 147*   Basic Metabolic Panel:  Recent Labs Lab 08/10/15 0454 08/11/15 0523 08/12/15 0531 08/13/15 0458 08/14/15 0551  NA 139 137 139 140 140  K 3.7 3.6 4.1 3.6 4.6  CL 108 106 106 104 104  CO2 GLUCOSE 145* 161* 158* 151* 125*  BUN 31* 33* 40* 42* 36*  CREATININE 0.43* 0.45 0.49 0.48 0.43*  CALCIUM 8.2* 8.1* 8.1* 8.1* 8.3*  MG  --  2.4 2.4  --  2.3   GFR: Estimated Creatinine Clearance: 43.2 mL/min (by C-G  formula based on Cr of 0.43). Liver Function Tests:  Recent Labs Lab 08/11/15 0523  AST 14*  ALT 16  ALKPHOS 47  BILITOT 0.2*  PROT 5.6*  ALBUMIN 2.6*   No results for input(s): LIPASE, AMYLASE in the last 168 hours. No results for input(s): AMMONIA in the last 168 hours. Coagulation Profile: No results for input(s): INR, PROTIME in the last 168 hours. Cardiac Enzymes:  Recent Labs Lab 08/08/15 0101  TROPONINI <0.03   BNP (last 3 results) No results for input(s): PROBNP in the last 8760 hours. HbA1C: No results for input(s): HGBA1C in the last 72 hours. CBG: No results for input(s): GLUCAP in the last 168 hours. Lipid Profile: No results for input(s): CHOL, HDL, LDLCALC, TRIG, CHOLHDL, LDLDIRECT in the last 72 hours. Thyroid Function Tests:  Recent Labs  08/12/15 0531  TSH 0.040*   Anemia Panel: No results for input(s): VITAMINB12, FOLATE, FERRITIN, TIBC, IRON, RETICCTPCT in the last 72 hours. Sepsis Labs:  Recent Labs Lab 08/08/15 0112 08/11/15 0523  LATICACIDVEN 2.1* 1.8    Recent Results (from the past 240 hour(s))  MRSA PCR Screening     Status: None   Collection Time: 08/05/15  6:23 AM  Result Value Ref Range Status   MRSA by  PCR NEGATIVE NEGATIVE Final    Comment:        The GeneXpert MRSA Assay (FDA approved for NASAL specimens only), is one component of a comprehensive MRSA colonization surveillance program. It is not intended to diagnose MRSA infection nor to guide or monitor treatment for MRSA infections.   Culture, blood (routine x 2) Call MD if unable to obtain prior to antibiotics being given     Status: None   Collection Time: 08/08/15  1:04 PM  Result Value Ref Range Status   Specimen Description   Final    BLOOD LEFT HAND Performed at [facility]    Special Requests   Final    BOTTLES DRAWN AEROBIC AND ANAEROBIC BLUE 5CC RED 3CC   Culture   Final    NO GROWTH 5 DAYS Performed at [facility]     Report Status 08/13/2015 FINAL  Final  Culture, blood (routine x 2) Call MD if unable to obtain prior to antibiotics being given     Status: None   Collection Time: 08/08/15  1:10 PM  Result Value Ref Range Status   Specimen Description   Final    BLOOD RIGHT ARM Performed at [facility]    Special Requests   Final    IN PEDIATRIC BOTTLE  2CC Performed at [facility]    Culture   Final    NO GROWTH 5 DAYS Performed at [facility]    Report Status 08/13/2015 FINAL  Final  Culture, sputum-assessment     Status: None   Collection Time: 08/10/15  7:14 PM  Result Value Ref Range Status   Specimen Description SPU  Final   Special Requests Normal  Final   Sputum evaluation THIS SPECIMEN IS ACCEPTABLE FOR SPUTUM CULTURE  Final   Report Status 08/10/2015 FINAL  Final  Culture, respiratory (NON-Expectorated)     Status: None   Collection Time: 08/10/15  7:14 PM  Result Value Ref Range Status   Specimen Description SPUTUM  Final   Special Requests NONE  Final   Gram Stain   Final    FEW WBC PRESENT, PREDOMINANTLY PMN RARE SQUAMOUS EPITHELIAL CELLS PRESENT FEW GRAM POSITIVE COCCI IN PAIRS IN CLUSTERS Performed at [facility]    Culture   Final    NORMAL OROPHARYNGEAL FLORA Performed at [facility]    Report Status 08/13/2015 FINAL  Final         Radiology Studies: No results found.      Scheduled Meds: . amiodarone  200 mg Oral Daily  . amitriptyline  75 mg Oral QHS  . amoxicillin-clavulanate  1 tablet Oral Q12H  . antiseptic oral rinse  7 mL Mouth Rinse q12n4p  . aspirin  325 mg Oral Daily  . chlorhexidine  15 mL Mouth Rinse BID  . diltiazem  30 mg Oral Q12H  . enoxaparin (LOVENOX) injection  40 mg Subcutaneous Q24H  . famotidine  20 mg Oral QHS  . fluticasone  2 spray Each Nare Daily  . furosemide  40 mg Intravenous BID  . guaiFENesin  600 mg Oral BID  . ipratropium-albuterol  3 mL Nebulization  BID  . levothyroxine  50 mcg Oral QAC breakfast  . lisinopril  5 mg Oral Daily  . mirtazapine  7.5 mg Oral QHS  . PARoxetine  40 mg Oral QHS  . [START ON 08/15/2015] predniSONE  50 mg Oral Q breakfast  . saccharomyces boulardii  250 mg Oral BID  . senna-docusate  1 tablet Oral QHS  . traZODone  100 mg Oral QHS   Continuous Infusions:    LOS: 6 days    Time spent: 35 minutes   Ellese Julius, MD PhD [facility] Pager [phone]  If 7PM-7AM, please contact night-coverage [email or web address] Password [email or web address] 08/14/2015, 2:30 PM

## 2015-08-14 NOTE — Progress Notes (Signed)
Unable to check ambulatory pulse ox at this time. Pt c/o BLE weakness, pt is a 2 person assist to the bedside commode and complains that her "legs will give out." Dr Roda ShuttersXu aware.

## 2015-08-14 NOTE — Progress Notes (Addendum)
LCSW following for disposition, please see full assessment and other progress notes. Patient is from ALF: [facility]. At this time, patient and family have decided to look into the option of SNF for discharge planning purposes and follow   SNF has been initiated earlier in the admission and SNF list given for family to review. Bed offers given today for review with son and daughter for SNF placement. Daughter [name] has been called:  [phone] Son: [name] also called:  [phone]  (POA and main contact)  LCSW spoke with patient and daughter. Choice would be [facility] if they have to go to SNF. Will have unit CSW follow up on Monday.  Son will be contact/one to sign patient into SNF.   Will follow along with disposition, pending SNF at this time.  Paula EmoryHannah Macalister Arnaud LCSW, MSW Clinical Social Work: [facility] [employer] [phone]

## 2015-08-14 NOTE — Progress Notes (Addendum)
During morning assessment patient appears pale, in comparison to yesterday. Pt reports generalized weakness, fatigue. Pt sleeping more this morning, however, pt did eat breakfast and reports she has not been able to rest well at night. Dr. Roda ShuttersXu paged and updated.

## 2015-08-15 LAB — COMPREHENSIVE METABOLIC PANEL
ALT: 15 U/L (ref 14–54)
AST: 15 U/L (ref 15–41)
Albumin: 2.5 g/dL — ABNORMAL LOW (ref 3.5–5.0)
Alkaline Phosphatase: 42 U/L (ref 38–126)
Anion gap: 4 — ABNORMAL LOW (ref 5–15)
BUN: 39 mg/dL — AB (ref 6–20)
CHLORIDE: 102 mmol/L (ref 101–111)
CO2: 31 mmol/L (ref 22–32)
CREATININE: 0.57 mg/dL (ref 0.44–1.00)
Calcium: 8 mg/dL — ABNORMAL LOW (ref 8.9–10.3)
GFR calc Af Amer: >60 mL/min (ref >60)
Glucose, Bld: 97 mg/dL (ref 65–99)
Potassium: 4 mmol/L (ref 3.5–5.1)
SODIUM: 137 mmol/L (ref 135–145)
Total Bilirubin: 0.3 mg/dL (ref 0.3–1.2)
Total Protein: 5 g/dL — ABNORMAL LOW (ref 6.5–8.1)

## 2015-08-15 LAB — CBC
HCT: 35.3 % — ABNORMAL LOW (ref 36.0–46.0)
HEMOGLOBIN: 11.1 g/dL — AB (ref 12.0–15.0)
MCH: 26.1 pg (ref 26.0–34.0)
MCHC: 31.4 g/dL (ref 30.0–36.0)
MCV: 82.9 fL (ref 78.0–100.0)
PLATELETS: 144 10*3/uL — AB (ref 150–400)
RBC: 4.26 MIL/uL (ref 3.87–5.11)
RDW: 16.3 % — ABNORMAL HIGH (ref 11.5–15.5)
WBC: 12 10*3/uL — AB (ref 4.0–10.5)

## 2015-08-15 LAB — STREP PNEUMONIAE URINARY ANTIGEN: STREP PNEUMO URINARY ANTIGEN: NEGATIVE

## 2015-08-15 MED ORDER — PREDNISONE 20 MG PO TABS
40.0000 mg | ORAL_TABLET | Freq: Every day | ORAL | Status: DC
  Administered 2015-08-16: 40 mg via ORAL
  Filled 2015-08-15: qty 2

## 2015-08-15 NOTE — Progress Notes (Addendum)
Physical Therapy Treatment Patient Details Name: Paula Bird MRN: [id number] DOB: November 06, 1925 Today's Date: 08/15/2015    SATURATION QUALIFICATIONS: (This note is used to comply with regulatory documentation for home oxygen)  Patient Saturations on Room Air at Rest = 94%  Patient Saturations on Room Air while Ambulating = 89% (briefly before increasing to >90%)     History of Present Illness 82 yo female admitted with HCAP.     PT Comments    Pt appears weaker on today. Fatigues quickly with activity. Pt only able to tolerate walking ~20 feet or so at a time before fatiguing. Has had a fall during this hospital stay. At this time, recommending ST rehab at SNF prior to pt returning to ALF (if pt will agree).   Follow Up Recommendations  SNF;Supervision/Assistance - 24 hour (pt requiring increased assistance and appears weaker on today. Feel ST rehab at SNF is safest d/c option at this time.)     Equipment Recommendations  None recommended by PT    Recommendations for Other Services       Precautions / Restrictions Precautions Precautions: Fall Precaution Comments: monitor O2 sats Restrictions Weight Bearing Restrictions: No    Mobility  Bed Mobility Overal bed mobility: Needs Assistance Bed Mobility: Supine to Sit     Supine to sit: Min assist;HOB elevated Sit to supine: Supervision   General bed mobility comments: small amount of assist for trunk.   Transfers Overall transfer level: Needs assistance Equipment used: Rolling walker (2 wheeled) Transfers: Sit to/from Stand Sit to Stand: Min assist         General transfer comment: Assist to rise, stabilize, control descent. VCs safety, hand placement  Ambulation/Gait Ambulation/Gait assistance: Min assist Ambulation Distance (Feet): 22 Feet (x2) Assistive device: Rolling walker (2 wheeled) Gait Pattern/deviations: Step-through pattern;Decreased stride length;Decreased step length - left;Decreased step  length - right;Trunk flexed     General Gait Details: Assist to stabilize pt. Pt fatigues quickly. dyspnea 3/4. O2 sats 89% on RA (very briefly before returning to >90%).    Stairs            Wheelchair Mobility    Modified Rankin (Stroke Patients Only)       Balance Overall balance assessment: History of Falls;Needs assistance         Standing balance support: During functional activity Standing balance-Leahy Scale: Poor                      Cognition Arousal/Alertness: Awake/alert Behavior During Therapy: WFL for tasks assessed/performed Overall Cognitive Status: Within Functional Limits for tasks assessed                      Exercises      General Comments        Pertinent Vitals/Pain Pain Assessment: No/denies pain    Home Living                      Prior Function            PT Goals (current goals can now be found in the care plan section) Progress towards PT goals: Progressing toward goals (very slowly)    Frequency  Min 3X/week    PT Plan Discharge plan needs to be updated    Co-evaluation             End of Session Equipment Utilized During Treatment: Gait belt Activity Tolerance: Patient limited by fatigue Patient left:  in bed;with call bell/phone within reach;with bed alarm set     Time: 0910-0932 PT Time Calculation (min) (ACUTE ONLY): 22 min  Charges:  $Gait Training: 8-22 mins                    G Codes:      Rebeca Alert, MPT Pager: [phone]

## 2015-08-15 NOTE — Clinical Social Work Placement (Signed)
[facility] a [facility]. CSW has completed FL2 & will continue to follow and assist with discharge when ready - anticipating tomorrow, Tues 5/23.    Lincoln MaxinKelly Britanee Vanblarcom, LCSW [facility] Clinical Social Worker cell #: [phone]     CLINICAL SOCIAL WORK PLACEMENT  NOTE  Date:  08/15/2015  Patient Details  Name: Paula Evangelistvelyn Manfredonia MRN: [id number] Date of Birth: 1925-10-13  Clinical Social Work is seeking post-discharge placement for this patient at the Skilled  Nursing Facility level of care (*CSW will initial, date and re-position this form in  chart as items are completed):  Yes   Patient/family provided with [place] [facility] Clinical Social Work Department's list of facilities offering this level of care within the geographic area requested by the patient (or if unable, by the patient's family).  Yes   Patient/family informed of their freedom to choose among providers that offer the needed level of care, that participate in Medicare, Medicaid or managed care program needed by the patient, have an available bed and are willing to accept the patient.  Yes   Patient/family informed of [place]'s ownership interest in [facility] and [facility], as well as of the fact that they are under no obligation to receive care at these facilities.  PASRR submitted to EDS on 08/09/15     PASRR number received on 08/09/15     Existing PASRR number confirmed on       FL2 transmitted to all facilities in geographic area requested by pt/family on 08/09/15     FL2 transmitted to all facilities within larger geographic area on       Patient informed that his/her managed care company has contracts with or will negotiate with certain facilities, including the following:        Yes   Patient/family informed of bed offers received.  [facility]     Physician recommends and patient chooses bed at      [facility]  .  Patient to be transferred to facility by       Patient family notified on   of transfer.  Name of family member notified:        PHYSICIAN       Additional Comment:    _______________________________________________ Arlyss RepressHarrison, Leatha Rohner F, LCSW 08/15/2015, 2:20 PM

## 2015-08-15 NOTE — Progress Notes (Signed)
[facility]  Oxygen sat 86% on RA when returned to bed.  Oxygen applied at 2 liters and sat increased to 93%.  Currently maintaining sat at 92% on 1 liter.

## 2015-08-15 NOTE — Care Management Important Message (Signed)
Important Message  Patient Details  Name: Paula Bird MRN: [id number] Date of Birth: 1925/04/30   Medicare Important Message Given:  Yes    Haskell FlirtJamison, Tyce Delcid 08/15/2015, 9:30 AMImportant Message  Patient Details  Name: Paula Bird MRN: [id number] Date of Birth: 1925/04/30   Medicare Important Message Given:  Yes    Haskell FlirtJamison, Elaine Middleton 08/15/2015, 9:30 AM

## 2015-08-15 NOTE — Progress Notes (Signed)
Resumed care of patient. Agree with previous assessment. Reviewed orders. Will continue to monitor. 

## 2015-08-15 NOTE — Progress Notes (Signed)
PROGRESS NOTE    Paula Bird  ZOX:[id number] DOB: 1925-08-23 DOA: 08/08/2015 PCP: [name], MD  Brief Narrative: 82 year old female with a history of cognitive impairment, current resident at skilled nursing facility, recently discharged from my service on 08/06/2015 at which time she was treated for COPD exacerbation. Chest x-ray performed on 08/04/2015 did not show acute cardiopulmonary disease. She showed improvement was discharged back to her skilled nursing facility. She came back to the emergency department on 08/08/2015 with worsening shortness of breath cough and increased sputum production. Chest x-ray now showing developing consolidation involving left lung base. She was admitted for healthcare associated pneumonia, on broad-spectrum IV antimicrobial therapy  Assessment & Plan:   Principal Problem:   HCAP (healthcare-associated pneumonia) Active Problems:   Hypothyroidism   Respiratory distress   Essential hypertension   COPD exacerbation (HCC)   1.  Healthcare associated pneumonia vs aspiration pna, acute hypoxia respiratory failure -Mrs. Chohan recently discharged from the hospital on 08/06/2015 at which time she was treated for a COPD exacerbation. X-ray during that hospitalization did not reveal acute cardiopulmonary disease. -She presented with increasing cough, shortness of breath, sputum production. X-ray performed in the emergency room showing development of left lung opacity --Sputum and blood cultures unrevealing, mrsa screen negative, DG esophagus did showed esophageal dysmotility. regualr diet and thin liquid per speech eval. Compensations: Slow rate;Small sips/bites (start meals with liquids, small meals, consume room temp liquids if helpful) Postural Changes: Seated upright at 90 degrees;Remain upright for at least 30 minutes after po intake , start trial of low dose calcium channel blocker -she was started on empiric IV antibiotic therapy with cefepime and  vancomycin on admission, now abx narrowed to oral augmentin -Continue supportive care. May need to discharge on oxygen, continue lasix, repeat cxr showed improvement -proceed with CT chest due to persistent cough: ct chest:  1. Dense consolidation within the left lower lobe posteriorly, most suggestive of airspace collapse with associated volume loss. This presumed airspace collapse could be related to central obstructing mucous plug or mass. 2. Smaller consolidation at the right lung base, also most likely atelectasis. 3. No evidence of pneumonia within either lung.  start chest PT with chest vest, will likely need this to be continued at snf.  2.  COPD exacerbation -On initial evaluation had diffuse expiratory wheezes, rhonchi, dyspnea at rest, denies history of long term smoking or second hand smoking, but did report having exacerbations and bronchitic yearly, was treated with steroids in the past. -Suspect related to COPD exacerbation precipitated by infectious process -continued cough, upper airway congestion, wheezing seems has resolved, taper systemic steroids, decrease nebulizer treatments frequency, abx narrowed -wean oxygen and ambulate  3.  Paroxysmal atrial fibrillation -She is not anticoagulated due to h/o frequent falls -EKG on presentation showing sinus rhythm -Continue amiodarone 20 g by mouth daily -she is not on betablocker or calcium channel blocker , trial of low dose ccb, monitor heart rate and bp  4.  Hypertension -discontinue norvasc, now started on low dose calcium channel blocker -Blood pressures controlled  5. Anxiety: continue home meds  6. FTT: fall vs assist to floor on 5/20, patient denies pain, continue PT, need SNF level care at discharge,  Report feeling weak, will check orthostatic vital sign, may need to decrease lasix dose pending orthostatic vital sign, cr has been stable.   DVT prophylaxis: Lovenox Code Status: DO NOT RESUSCITATE Family  Communication: my college Dr Marguerite Olea  spoke to her son who is her healthcare  power of attorney over telephone conversation, i talked to daughter Erskine Squibb in room on 5/17, i talked to patient's son over the phone on 5/20,  I talked to daughter and son in law in room on 5/21 Disposition Plan:  Likely discharge to skilled nursing facility on 5/23  Antimicrobials:   Vancomycin started on 08/08/2015, stopped on 5/17  Cefepime started on 08/08/2015 to 5/20  augmentin from 5/20   Subjective: Frail and anxious, seems less intermittent coughing spells, upper airway congestion, no wheezing, on room air,  Objective: Filed Vitals:   08/14/15 2334 08/15/15 0200 08/15/15 0300 08/15/15 0644  BP: 140/57   135/55  Pulse: 75   72  Temp: 97.9 F (36.6 C)   97.7 F (36.5 C)  TempSrc: Oral   Oral  Resp: 20   20  Height:      Weight:      SpO2: 93% 87% 93% 92%    Intake/Output Summary (Last 24 hours) at 08/15/15 1018 Last data filed at 08/15/15 0651  Gross per 24 hour  Intake    880 ml  Output   1250 ml  Net   -370 ml   Filed Weights   08/08/15 1324 08/08/15 1243  Weight: 63.05 kg (139 lb) 62.9 kg (138 lb 10.7 oz)    Examination:  General exam: frail, anxious, intermittent coughing spells during encounter Respiratory system: no wheezing, + rhonchi and upper airway sounds, no rales, Cardiovascular system: S1 & S2 heard, RRR. No JVD, murmurs, rubs, gallops or clicks. No pedal edema. Gastrointestinal system: Abdomen is nondistended, soft and nontender. No organomegaly or masses felt. Normal bowel sounds heard. Central nervous system: Alert and oriented. No focal neurological deficits. Extremities: Symmetric 5 x 5 power. Skin: No rashes, lesions or ulcers Psychiatry: Judgement and insight appear normal. Mood & affect appropriate.     Data Reviewed: I have personally reviewed following labs and imaging studies  CBC:  Recent Labs Lab 08/10/15 0454 08/11/15 0523 08/15/15 0515  WBC 9.4  8.9 12.0*  HGB 10.7* 11.0* 11.1*  HCT 34.1* 34.9* 35.3*  MCV 81.2 82.1 82.9  PLT PLATELET CLUMPS NOTED ON SMEAR, UNABLE TO ESTIMATE 147* 144*   Basic Metabolic Panel:  Recent Labs Lab 08/11/15 0523 08/12/15 0531 08/13/15 0458 08/14/15 0551 08/15/15 0515  NA 137 139 140 140 137  K 3.6 4.1 3.6 4.6 4.0  CL 106 106 104 104 102  CO2 GLUCOSE 161* 158* 151* 125* 97  BUN 33* 40* 42* 36* 39*  CREATININE 0.45 0.49 0.48 0.43* 0.57  CALCIUM 8.1* 8.1* 8.1* 8.3* 8.0*  MG 2.4 2.4  --  2.3  --    GFR: Estimated Creatinine Clearance: 43.2 mL/min (by C-G formula based on Cr of 0.57). Liver Function Tests:  Recent Labs Lab 08/11/15 0523 08/15/15 0515  AST 14* 15  ALT 16 15  ALKPHOS 47 42  BILITOT 0.2* 0.3  PROT 5.6* 5.0*  ALBUMIN 2.6* 2.5*   No results for input(s): LIPASE, AMYLASE in the last 168 hours. No results for input(s): AMMONIA in the last 168 hours. Coagulation Profile: No results for input(s): INR, PROTIME in the last 168 hours. Cardiac Enzymes: No results for input(s): CKTOTAL, CKMB, CKMBINDEX, TROPONINI in the last 168 hours. BNP (last 3 results) No results for input(s): PROBNP in the last 8760 hours. HbA1C: No results for input(s): HGBA1C in the last 72 hours. CBG: No results for input(s): GLUCAP in the last 168 hours. Lipid Profile: No  results for input(s): CHOL, HDL, LDLCALC, TRIG, CHOLHDL, LDLDIRECT in the last 72 hours. Thyroid Function Tests: No results for input(s): TSH, T4TOTAL, FREET4, T3FREE, THYROIDAB in the last 72 hours. Anemia Panel: No results for input(s): VITAMINB12, FOLATE, FERRITIN, TIBC, IRON, RETICCTPCT in the last 72 hours. Sepsis Labs:  Recent Labs Lab 08/11/15 0523  LATICACIDVEN 1.8    Recent Results (from the past 240 hour(s))  Culture, blood (routine x 2) Call MD if unable to obtain prior to antibiotics being given     Status: None   Collection Time: 08/08/15  1:04 PM  Result Value Ref Range Status   Specimen  Description   Final    BLOOD LEFT HAND Performed at [facility]    Special Requests   Final    BOTTLES DRAWN AEROBIC AND ANAEROBIC BLUE 5CC RED 3CC   Culture   Final    NO GROWTH 5 DAYS Performed at [facility]    Report Status 08/13/2015 FINAL  Final  Culture, blood (routine x 2) Call MD if unable to obtain prior to antibiotics being given     Status: None   Collection Time: 08/08/15  1:10 PM  Result Value Ref Range Status   Specimen Description   Final    BLOOD RIGHT ARM Performed at [facility]    Special Requests   Final    IN PEDIATRIC BOTTLE  2CC Performed at [facility]    Culture   Final    NO GROWTH 5 DAYS Performed at [facility]    Report Status 08/13/2015 FINAL  Final  Culture, sputum-assessment     Status: None   Collection Time: 08/10/15  7:14 PM  Result Value Ref Range Status   Specimen Description SPU  Final   Special Requests Normal  Final   Sputum evaluation THIS SPECIMEN IS ACCEPTABLE FOR SPUTUM CULTURE  Final   Report Status 08/10/2015 FINAL  Final  Culture, respiratory (NON-Expectorated)     Status: None   Collection Time: 08/10/15  7:14 PM  Result Value Ref Range Status   Specimen Description SPUTUM  Final   Special Requests NONE  Final   Gram Stain   Final    FEW WBC PRESENT, PREDOMINANTLY PMN RARE SQUAMOUS EPITHELIAL CELLS PRESENT FEW GRAM POSITIVE COCCI IN PAIRS IN CLUSTERS Performed at [facility]    Culture   Final    NORMAL OROPHARYNGEAL FLORA Performed at [facility]    Report Status 08/13/2015 FINAL  Final         Radiology Studies: Ct Chest Wo Contrast  08/14/2015  CLINICAL DATA:  Coughing and shortness of breath for 3 weeks, now with some central chest pain. EXAM: CT CHEST WITHOUT CONTRAST TECHNIQUE: Multidetector CT imaging of the chest was performed following the standard protocol without IV contrast. COMPARISON:  Chest x-ray dated  08/11/2015 and chest CT dated 08/13/2014. FINDINGS: Mediastinum/Lymph Nodes: Heart size is upper normal. No pericardial effusion. Coronary artery calcifications noted. Scattered atherosclerotic changes are seen along the walls of the normal-caliber thoracic aorta. No masses or enlarged lymph nodes seen within the mediastinum or perihilar regions. Trachea and central bronchi are unremarkable. There is collapse of the peripheral bronchi in the left lower lobe due to presumed airspace collapse. Lungs/Pleura: Dense consolidation at the left lung base, most suggestive of airspace collapse with associated volume loss. Left lung is otherwise clear. Smaller consolidation at the right lung base, also likely atelectasis. Small focus  of ground-glass opacity within the anterior-medial aspects of the right upper lobe, probably mild edema and/or pneumonitis. Right lung otherwise clear. Upper abdomen: Limited images of the upper abdomen are unremarkable. Probable small stones and/or sludge within the gallbladder, incompletely imaged. Musculoskeletal: Degenerative changes throughout the thoracic and upper lumbar spine. Compression fractures status post vertebroplasty fixation at the thoracolumbar junction. Milder chronic compression deformity within the mid thoracic spine. No acute -appearing osseous abnormality. IMPRESSION: 1. Dense consolidation within the left lower lobe posteriorly, most suggestive of airspace collapse with associated volume loss. This presumed airspace collapse could be related to central obstructing mucous plug or mass. 2. Smaller consolidation at the right lung base, also most likely atelectasis. 3. No evidence of pneumonia within either lung. 4. Probable cholelithiasis, incompletely imaged. 5. Degenerative changes within the thoracic and upper lumbar spine. Status post vertebroplasties for compression fractures at the thoracolumbar junction. No acute-appearing osseous abnormality seen. Electronically Signed    By: Bary RichardStan  Maynard M.D.   On: 08/14/2015 17:51        Scheduled Meds: . amiodarone  200 mg Oral Daily  . amitriptyline  75 mg Oral QHS  . amoxicillin-clavulanate  1 tablet Oral Q12H  . antiseptic oral rinse  7 mL Mouth Rinse q12n4p  . aspirin  325 mg Oral Daily  . chlorhexidine  15 mL Mouth Rinse BID  . diltiazem  30 mg Oral Q12H  . enoxaparin (LOVENOX) injection  40 mg Subcutaneous Q24H  . famotidine  20 mg Oral QHS  . fluticasone  2 spray Each Nare Daily  . furosemide  40 mg Intravenous BID  . guaiFENesin  600 mg Oral BID  . ipratropium-albuterol  3 mL Nebulization BID  . levothyroxine  50 mcg Oral QAC breakfast  . lisinopril  5 mg Oral Daily  . mirtazapine  7.5 mg Oral QHS  . PARoxetine  40 mg Oral QHS  . [START ON 08/16/2015] predniSONE  40 mg Oral Q breakfast  . saccharomyces boulardii  250 mg Oral BID  . senna-docusate  1 tablet Oral QHS  . traZODone  100 mg Oral QHS   Continuous Infusions:    LOS: 7 days    Time spent: 35 minutes   Humna Moorehouse, MD PhD [facility] Pager [phone]  If 7PM-7AM, please contact night-coverage [email or web address] Password [email or web address] 08/15/2015, 10:18 AM

## 2015-08-16 DIAGNOSIS — J9809 Other diseases of bronchus, not elsewhere classified: Secondary | ICD-10-CM

## 2015-08-16 DIAGNOSIS — R05 Cough: Secondary | ICD-10-CM

## 2015-08-16 DIAGNOSIS — K224 Dyskinesia of esophagus: Secondary | ICD-10-CM

## 2015-08-16 LAB — CBC
HCT: 33.9 % — ABNORMAL LOW (ref 36.0–46.0)
Hemoglobin: 10.9 g/dL — ABNORMAL LOW (ref 12.0–15.0)
MCH: 26.7 pg (ref 26.0–34.0)
MCHC: 32.2 g/dL (ref 30.0–36.0)
MCV: 82.9 fL (ref 78.0–100.0)
PLATELETS: 171 10*3/uL (ref 150–400)
RBC: 4.09 MIL/uL (ref 3.87–5.11)
RDW: 16.1 % — AB (ref 11.5–15.5)
WBC: 13.2 10*3/uL — ABNORMAL HIGH (ref 4.0–10.5)

## 2015-08-16 LAB — BASIC METABOLIC PANEL
Anion gap: 6 (ref 5–15)
BUN: 36 mg/dL — AB (ref 4–21)
BUN: 36 mg/dL — AB (ref 6–20)
CO2: 31 mmol/L (ref 22–32)
Calcium: 7.8 mg/dL — ABNORMAL LOW (ref 8.9–10.3)
Chloride: 100 mmol/L — ABNORMAL LOW (ref 101–111)
Creatinine, Ser: 0.39 mg/dL — ABNORMAL LOW (ref 0.44–1.00)
Creatinine: 0.4 mg/dL — AB (ref 0.5–1.1)
GFR calc Af Amer: >60 mL/min (ref >60)
GLUCOSE: 113 mg/dL
GLUCOSE: 113 mg/dL — AB (ref 65–99)
POTASSIUM: 3.4 mmol/L — AB (ref 3.5–5.1)
Sodium: 137 mmol/L (ref 135–145)
Sodium: 137 mmol/L (ref 137–147)

## 2015-08-16 LAB — CBC AND DIFFERENTIAL: WBC: 13.2 10^3/mL

## 2015-08-16 MED ORDER — PREDNISONE 10 MG PO TABS: 10.0000 mg | ORAL_TABLET | Freq: Every day | ORAL | Status: DC

## 2015-08-16 MED ORDER — DILTIAZEM HCL 30 MG PO TABS: 30.0000 mg | ORAL_TABLET | Freq: Three times a day (TID) | ORAL | Status: DC

## 2015-08-16 MED ORDER — SENNA-DOCUSATE SODIUM 8.6-50 MG PO TABS: 1.0000 | ORAL_TABLET | Freq: Two times a day (BID) | ORAL | Status: DC

## 2015-08-16 MED ORDER — HYDROCHLOROTHIAZIDE 25 MG PO TABS: 25.0000 mg | ORAL_TABLET | ORAL | Status: DC

## 2015-08-16 MED ORDER — PREDNISONE 10 MG (21) PO TBPK: 10.0000 mg | ORAL_TABLET | Freq: Every day | ORAL | Status: DC

## 2015-08-16 MED ORDER — POTASSIUM CHLORIDE CRYS ER 20 MEQ PO TBCR
40.0000 meq | EXTENDED_RELEASE_TABLET | Freq: Once | ORAL | Status: AC
  Administered 2015-08-16: 40 meq via ORAL
  Filled 2015-08-16: qty 2

## 2015-08-16 MED ORDER — FAMOTIDINE 20 MG PO TABS: 20.0000 mg | ORAL_TABLET | Freq: Two times a day (BID) | ORAL | Status: DC

## 2015-08-16 MED ORDER — FLUTICASONE PROPIONATE 50 MCG/ACT NA SUSP: 2.0000 | Freq: Every day | NASAL | Status: DC

## 2015-08-16 NOTE — Progress Notes (Signed)
[facility] and given to [place]. Pt will be transported via [facility]. Pt  Made aware.

## 2015-08-16 NOTE — Clinical Social Work Placement (Signed)
Patient is set to discharge [facility] today. Patient & son, [name] made aware. Discharge packet given to RN, Catie. [facility] called for transport.     Lincoln MaxinKelly Tequlia Gonsalves, LCSW [facility] Clinical Social Worker cell #: [phone]    CLINICAL SOCIAL WORK PLACEMENT  NOTE  Date:  08/16/2015  Patient Details  Name: Paula Evangelistvelyn Paddock MRN: [id number] Date of Birth: 1925/07/26  Clinical Social Work is seeking post-discharge placement for this patient at the Skilled  Nursing Facility level of care (*CSW will initial, date and re-position this form in  chart as items are completed):  Yes   Patient/family provided with [facility] Clinical Social Work Department's list of facilities offering this level of care within the geographic area requested by the patient (or if unable, by the patient's family).  Yes   Patient/family informed of their freedom to choose among providers that offer the needed level of care, that participate in Medicare, Medicaid or managed care program needed by the patient, have an available bed and are willing to accept the patient.  Yes   Patient/family informed of [facility]'s ownership interest in [facility] and [facility], as well as of the fact that they are under no obligation to receive care at these facilities.  PASRR submitted to EDS on 08/09/15     PASRR number received on 08/09/15     Existing PASRR number confirmed on       FL2 transmitted to all facilities in geographic area requested by pt/family on 08/09/15     FL2 transmitted to all facilities within larger geographic area on       Patient informed that his/her managed care company has contracts with or will negotiate with certain facilities, including the following:        Yes   Patient/family informed of bed offers received.  [facility]     Physician recommends and patient chooses bed at      [facility]  08/16/15.  Patient to be transferred to facility by [facility]     Patient family notified on 08/16/15 of transfer.  Name of family member notified:  patient's son, [name] via phone     PHYSICIAN       Additional Comment:    _______________________________________________ Arlyss RepressHarrison, Ivannia Willhelm F, LCSW 08/16/2015, 4:20 PM

## 2015-08-16 NOTE — Discharge Summary (Signed)
Discharge Summary  Paula Bird ZOX:[id number] DOB: [birth year]  PCP: [name], MD  Admit date: 08/08/2015 Discharge date: 08/16/2015  Time spent: >6mins  Recommendations for Outpatient Follow-up:  1. F/u with PMD in SNF within a week  for hospital discharge follow up, repeat cbc/bmp at follow up 2. Patient need to be followed by respiratory care, need chest PT with chest percussion /chest vest tid, continue flutter valves. 3. Patient need to sit up for all meals, during meals and 1hrs after meal.  4. Home oxygen arranged 2liter with activity  Discharge Diagnoses:  Active Hospital Problems   Diagnosis Date Noted  . HCAP (healthcare-associated pneumonia) 08/08/2015  . Respiratory distress 08/08/2015  . Essential hypertension 08/08/2015  . COPD exacerbation (HCC) 08/08/2015  . Hypothyroidism 08/04/2015    Resolved Hospital Problems   Diagnosis Date Noted Date Resolved  No resolved problems to display.    Discharge Condition: stable  Diet recommendation: heart healthy/carb modified  Filed Weights   08/08/15 0642 08/08/15 1243  Weight: 63.05 kg (139 lb) 62.9 kg (138 lb 10.7 oz)    History of present illness:  Chief Complaint: Shortness of breath  HPI: Paula Bird is a 82 y.o. female with medical history significant of cognitive impairment, hypertension, hypothyroidism, paroxysmal atrial fibrillation, chronic obstructive pulmonary disease, who I recently discharged from my service on 08/06/2015 where she was admitted and treated for a COPD exacerbation. During that hospitalization she was treated with systemic steroids, nebulizer treatments, empiric IV antibiotic therapy. Chest x-ray performed on 08/04/2015 did not show acute cardiopulmonary disease. She showed significant clinical improvement and was discharged back to her skilled nursing facility in stable condition. She was discharged on a steroid taper along with DuoNebs. She comes back to the emergency room today  with complaints of worsening shortness of breath associate with increased mucus production, generalized weakness and malaise. Repeat chest x-ray today now showing developing consolidation at left lung base not present on previous x-ray.  ED Course: In the emergency department she presented in respiratory distress, was given nebulizer treatments as well as Levaquin. Repeat chest x-ray done in the ER showing developing consolidation involving the left lung base.   Hospital Course:  Principal Problem:   HCAP (healthcare-associated pneumonia) Active Problems:   Hypothyroidism   Respiratory distress   Essential hypertension   COPD exacerbation (HCC)  Healthcare associated pneumonia vs aspiration pna, acute hypoxia respiratory failure -[name] recently discharged from the hospital on 08/06/2015 at which time she was treated for a COPD exacerbation. X-ray during that hospitalization did not reveal acute cardiopulmonary disease. -She presented with increasing cough, shortness of breath, sputum production. X-ray performed in the emergency room showing development of left lung opacity --Sputum and blood cultures unrevealing, mrsa screen negative,  -she was started on empiric IV antibiotic therapy with cefepime and vancomycin on admission, now abx narrowed to oral augmentin, she finished abx treatment in the hospital -she is discharge on oxygen 2liter with activity, keep o2 sat between 88% to 92% - she also received a few dose of lasix, repeat cxr showed improvement -proceed with CT chest on 5/21 due to persistent cough: ct chest:  1. Dense consolidation within the left lower lobe posteriorly, most suggestive of airspace collapse with associated volume loss. This presumed airspace collapse could be related to central obstructing mucous plug or mass. 2. Smaller consolidation at the right lung base, also most likely atelectasis. 3. No evidence of pneumonia within either lung.  Started chest PT  with chest vest,  need to continue chest PT at snf.   COPD exacerbation -On initial evaluation had diffuse expiratory wheezes, rhonchi, dyspnea at rest, denies history of long term smoking or second hand smoking, but did report having exacerbations and bronchitic yearly, was treated with steroids in the past. -Suspect related to COPD exacerbation precipitated by infectious process -continued cough, upper airway congestion, wheezing seems has resolved, taper systemic steroids, decrease nebulizer treatments frequency, she finished abx treatment in the hospital -she is discharged to snf with home o2 as mentioned above  esophageal dysmotility and acid reflux DG esophagus did showed esophageal dysmotility.  regualr diet and thin liquid per speech eval.  Compensations: Slow rate;Small sips/bites (start meals with liquids, small meals, consume room temp liquids if helpful) Postural Changes: Seated upright at 90 degrees;Remain upright for at least 30 minutes after po intake ,  start trial of low dose calcium channel blocker  Persistent cough, with CT chest no pneumonia, does has mucus plugging, will continue chest PT, treat acid reflux, need to sit up for an hour after each meal on flonase, will also d/c lisinopril.     Paroxysmal atrial fibrillation -She is not anticoagulated due to h/o frequent falls -EKG on presentation showing sinus rhythm -Continue amiodarone 20 g by mouth daily -she is not on betablocker or calcium channel blocker prior to admission ,  trial of low dose ccb started from this hospitalization  Patient tolerated cardizem 30mg  tid, snf pmd to continue monitor heart rate and bp.  Hypertension -discontinue norvasc and lisinopril, now started on low dose calcium channel blocker, home meds hctz reduced from daily to MWF. -Blood pressures controlled   Anxiety: continue home meds  FTT: fall vs assist to floor on 5/20, patient denies pain, continue PT, need SNF level care at  discharge,  Report feeling weak, mild orthostatic vital sign,  lasix stopped,  cr has been stable.  She is discharged on hctz MWF as mentioned above.    Code Status: DO NOT RESUSCITATE Family Communication: my college Dr Marguerite Olea spoke to her son who is her healthcare power of attorney over telephone conversation, i talked to daughter Erskine Squibb in room on 5/17, i talked to patient's son over the phone on 5/20, I talked to daughter and son in law in room on 5/21 Disposition Plan:  discharge to skilled nursing facility on 5/23  Antimicrobials:   Vancomycin started on 08/08/2015, stopped on 5/17  Cefepime started on 08/08/2015 to 5/20  augmentin from 5/20  Discharge Exam: BP 146/55 mmHg  Pulse 75  Temp(Src) 98.5 F (36.9 C) (Oral)  Resp 20  Ht 5\' 2"  (1.575 m)  Wt 62.9 kg (138 lb 10.7 oz)  BMI 25.36 kg/m2  SpO2 94%  General exam: frail, anxious, intermittent coughing spells during encounter Respiratory system: + rhonchi and upper airway sounds, no wheezing, no rales, Cardiovascular system: S1 & S2 heard, RRR. No JVD, murmurs, rubs, gallops or clicks. No pedal edema. Gastrointestinal system: Abdomen is nondistended, soft and nontender. No organomegaly or masses felt. Normal bowel sounds heard. Central nervous system: Alert and oriented. No focal neurological deficits. Extremities: Symmetric 5 x 5 power. Skin: No rashes, lesions or ulcers Psychiatry: Judgement and insight appear normal. Mood & affect appropriate.    Discharge Instructions You were cared for by a hospitalist during your hospital stay. If you have any questions about your discharge medications or the care you received while you were in the hospital after you are discharged, you can call the unit and asked  to speak with the hospitalist on call if the hospitalist that took care of you is not available. Once you are discharged, your primary care physician will handle any further medical issues. Please note that NO REFILLS  for any discharge medications will be authorized once you are discharged, as it is imperative that you return to your primary care physician (or establish a relationship with a primary care physician if you do not have one) for your aftercare needs so that they can reassess your need for medications and monitor your lab values.      Discharge Instructions    Diet - low sodium heart healthy    Complete by:  As directed   Carb modified diet     For home use only DME oxygen    Complete by:  As directed   2liters with acticity  Mode or (Route):  Nasal cannula  Liters per Minute:  2  Oxygen delivery system:  Gas     Increase activity slowly    Complete by:  As directed             Medication List    STOP taking these medications        amLODipine 5 MG tablet  Commonly known as:  NORVASC     levofloxacin 500 MG tablet  Commonly known as:  LEVAQUIN     lisinopril 40 MG tablet  Commonly known as:  PRINIVIL,ZESTRIL     predniSONE 10 MG (21) Tbpk tablet  Commonly known as:  STERAPRED UNI-PAK 21 TAB  Replaced by:  predniSONE 10 MG tablet      TAKE these medications        acetaminophen 325 MG tablet  Commonly known as:  TYLENOL  Take 650 mg by mouth every 4 (four) hours as needed for mild pain, moderate pain, fever or headache.     amiodarone 200 MG tablet  Commonly known as:  PACERONE  Take 200 mg by mouth daily.     amitriptyline 75 MG tablet  Commonly known as:  ELAVIL  Take 75 mg by mouth at bedtime.     aspirin 325 MG tablet  Take 325 mg by mouth daily.     bisacodyl 10 MG suppository  Commonly known as:  DULCOLAX  Place 10 mg rectally daily as needed for moderate constipation.     cholecalciferol 1000 units tablet  Commonly known as:  VITAMIN D  Take 2,000 Units by mouth daily.     Cranberry 425 MG Caps  Take 425 mg by mouth 2 (two) times daily.     diclofenac sodium 1 % Gel  Commonly known as:  VOLTAREN  Apply 1 application topically daily. Apply to  right knee     diltiazem 30 MG tablet  Commonly known as:  CARDIZEM  Take 1 tablet (30 mg total) by mouth 3 (three) times daily.     famotidine 20 MG tablet  Commonly known as:  PEPCID  Take 1 tablet (20 mg total) by mouth 2 (two) times daily.     fluticasone 50 MCG/ACT nasal spray  Commonly known as:  FLONASE  Place 2 sprays into both nostrils daily.     GuaiFENesin 150 MG/15ML Liqd  Take 1 Dose by mouth 4 (four) times daily. Started 05/12 for 10 days     hydrochlorothiazide 25 MG tablet  Commonly known as:  HYDRODIURIL  Take 1 tablet (25 mg total) by mouth every Monday, Wednesday, and Friday.     ipratropium-albuterol  0.5-2.5 (3) MG/3ML Soln  Commonly known as:  DUONEB  Take 3 mLs by nebulization every 4 (four) hours as needed.     levothyroxine 50 MCG tablet  Commonly known as:  SYNTHROID, LEVOTHROID  Take 50 mcg by mouth daily before breakfast.     mirtazapine 7.5 MG tablet  Commonly known as:  REMERON  Take 7.5 mg by mouth at bedtime.     MULTIVITAMIN GUMMIES ADULT PO  Take 1 tablet by mouth daily.     PARoxetine 40 MG tablet  Commonly known as:  PAXIL  Take 40 mg by mouth daily.     polyethylene glycol packet  Commonly known as:  MIRALAX / GLYCOLAX  Take 17 g by mouth daily.     predniSONE 10 MG tablet  Commonly known as:  DELTASONE  Take 1 tablet (10 mg total) by mouth daily with breakfast. Take 2tabs on day one , then 1tab on day two, then stop.     promethazine 25 MG tablet  Commonly known as:  PHENERGAN  Take 25 mg by mouth every 6 (six) hours as needed for nausea or vomiting.     saccharomyces boulardii 250 MG capsule  Commonly known as:  FLORASTOR  Take 250 mg by mouth 2 (two) times daily.     sennosides-docusate sodium 8.6-50 MG tablet  Commonly known as:  SENOKOT-S  Take 1 tablet by mouth 2 (two) times daily.     SUMAtriptan 25 MG tablet  Commonly known as:  IMITREX  Take 25 mg by mouth daily as needed for migraine. May repeat in 2 hours if  headache persists or recurs.     traZODone 100 MG tablet  Commonly known as:  DESYREL  Take 100 mg by mouth at bedtime.       No Known Allergies Follow-up Information    Follow up with [facility] .   Specialty:  Assisted Living Facility   Contact information:   [address] [place] [address] [phone]       The results of significant diagnostics from this hospitalization (including imaging, microbiology, ancillary and laboratory) are listed below for reference.    Significant Diagnostic Studies: Dg Chest 2 View  08/11/2015  CLINICAL DATA:  Cough and congestion.  Initial encounter. EXAM: CHEST  2 VIEW COMPARISON:  PA and lateral chest 08/09/2015. FINDINGS: Left pleural effusion and basilar airspace disease are again seen. Aeration appears mildly improved. The right lung remains clear. Heart size is enlarged. No pneumothorax. IMPRESSION: Some improvement in a left pleural effusion and basilar airspace disease. No new abnormality. Electronically Signed   By: Drusilla Kanner M.D.   On: 08/11/2015 18:42   Dg Chest 2 View  08/09/2015  CLINICAL DATA:  Followup pneumonia EXAM: CHEST  2 VIEW COMPARISON:  08/08/2015 FINDINGS: Small to moderate left and minimal right pleural effusions. There is additional airspace opacity at the left lung base consistent with pneumonia. Atelectasis is possible. There is prominence of the right peritracheal stripe which is stable, vascular in origin based on a prior CTA dated 08/13/2014. Mild linear atelectasis is noted at the right lung base. Remainder of the lungs is clear.  No evidence of pulmonary edema. Cardiac silhouette is mildly enlarged. No mediastinal or hilar masses or convincing adenopathy. IMPRESSION: 1. Persistent left lung base opacity representing a combination of a small to moderate pleural effusion with associated pneumonia or atelectasis. 2. Minimal right pleural effusion with minor right lung base  atelectasis.  3. Mild cardiomegaly.  No pulmonary edema. Electronically Signed   By: Amie Portland M.D.   On: 08/09/2015 09:48   Dg Chest 2 View  08/08/2015  CLINICAL DATA:  SOB, coughing, congestion, chest discomfort for 2 weeks. Pt takes HTN meds. Not diabetic. Nonsmoker. EXAM: CHEST  2 VIEW COMPARISON:  08/04/2015 FINDINGS: Emphysematous changes in the lungs. Opacity in the left lung base could indicate consolidation or atelectasis. This is developing since previous study. No blunting of costophrenic angles. No pneumothorax. Mediastinal contours appear intact. Calcification of the aorta. Degenerative changes in the spine. Multiple thoracic compression fractures, some with kyphoplasty changes. These are unchanged since previous study and likely indicate osteoporosis. IMPRESSION: Emphysematous changes in the lungs. Developing consolidation or atelectasis in the left lung base posteriorly. Electronically Signed   By: Burman Nieves M.D.   On: 08/08/2015 01:04   Dg Chest 2 View  08/04/2015  CLINICAL DATA:  Productive cough. EXAM: CHEST  2 VIEW COMPARISON:  Aug 13, 2014. FINDINGS: The heart size and mediastinal contours are within normal limits. Both lungs are clear. Old left rib fracture is noted. Atherosclerosis of thoracic aorta is noted. Anterior osteophyte formation is noted in lower thoracic spine. IMPRESSION: No active cardiopulmonary disease. Electronically Signed   By: Lupita Raider, M.D.   On: 08/04/2015 16:28   Ct Chest Wo Contrast  08/14/2015  CLINICAL DATA:  Coughing and shortness of breath for 3 weeks, now with some central chest pain. EXAM: CT CHEST WITHOUT CONTRAST TECHNIQUE: Multidetector CT imaging of the chest was performed following the standard protocol without IV contrast. COMPARISON:  Chest x-ray dated 08/11/2015 and chest CT dated 08/13/2014. FINDINGS: Mediastinum/Lymph Nodes: Heart size is upper normal. No pericardial effusion. Coronary artery calcifications noted. Scattered  atherosclerotic changes are seen along the walls of the normal-caliber thoracic aorta. No masses or enlarged lymph nodes seen within the mediastinum or perihilar regions. Trachea and central bronchi are unremarkable. There is collapse of the peripheral bronchi in the left lower lobe due to presumed airspace collapse. Lungs/Pleura: Dense consolidation at the left lung base, most suggestive of airspace collapse with associated volume loss. Left lung is otherwise clear. Smaller consolidation at the right lung base, also likely atelectasis. Small focus of ground-glass opacity within the anterior-medial aspects of the right upper lobe, probably mild edema and/or pneumonitis. Right lung otherwise clear. Upper abdomen: Limited images of the upper abdomen are unremarkable. Probable small stones and/or sludge within the gallbladder, incompletely imaged. Musculoskeletal: Degenerative changes throughout the thoracic and upper lumbar spine. Compression fractures status post vertebroplasty fixation at the thoracolumbar junction. Milder chronic compression deformity within the mid thoracic spine. No acute -appearing osseous abnormality. IMPRESSION: 1. Dense consolidation within the left lower lobe posteriorly, most suggestive of airspace collapse with associated volume loss. This presumed airspace collapse could be related to central obstructing mucous plug or mass. 2. Smaller consolidation at the right lung base, also most likely atelectasis. 3. No evidence of pneumonia within either lung. 4. Probable cholelithiasis, incompletely imaged. 5. Degenerative changes within the thoracic and upper lumbar spine. Status post vertebroplasties for compression fractures at the thoracolumbar junction. No acute-appearing osseous abnormality seen. Electronically Signed   By: Bary Richard M.D.   On: 08/14/2015 17:51   Dg Esophagus  08/10/2015  CLINICAL DATA:  Difficulty swallowing solid food for the past couple of years. Food feels like it  gets stuck in the proximal esophagus. EXAM: ESOPHOGRAM/BARIUM SWALLOW TECHNIQUE: Single contrast examination was performed using  thin barium.  FLUOROSCOPY TIME:  Radiation Exposure Index (as provided by the fluoroscopic device): 7.9 mGy If the device does not provide the exposure index: Fluoroscopy Time:  dictate in minutes and seconds Number of Acquired Images:  0 COMPARISON:  None. FINDINGS: A limited examination was performed in the recumbent LPO position due to patient condition. There is lack of primary peristalsis with tertiary contractions and stasis of contrast in the esophagus. No esophageal fold thickening, stricture or obstruction. A 13 mm barium tablet passed into the stomach with the aid of thin barium. IMPRESSION: Poor esophageal motility. Electronically Signed   By: Leanna Battles M.D.   On: 08/10/2015 11:04    Microbiology: Recent Results (from the past 240 hour(s))  Culture, blood (routine x 2) Call MD if unable to obtain prior to antibiotics being given     Status: None   Collection Time: 08/08/15  1:04 PM  Result Value Ref Range Status   Specimen Description   Final    BLOOD LEFT HAND Performed at [facility]    Special Requests   Final    BOTTLES DRAWN AEROBIC AND ANAEROBIC BLUE 5CC RED 3CC   Culture   Final    NO GROWTH 5 DAYS Performed at [facility]    Report Status 08/13/2015 FINAL  Final  Culture, blood (routine x 2) Call MD if unable to obtain prior to antibiotics being given     Status: None   Collection Time: 08/08/15  1:10 PM  Result Value Ref Range Status   Specimen Description   Final    BLOOD RIGHT ARM Performed at [facility]    Special Requests   Final    IN PEDIATRIC BOTTLE  2CC Performed at [facility]    Culture   Final    NO GROWTH 5 DAYS Performed at [facility]    Report Status 08/13/2015 FINAL  Final  Culture, sputum-assessment     Status: None   Collection Time:  08/10/15  7:14 PM  Result Value Ref Range Status   Specimen Description SPU  Final   Special Requests Normal  Final   Sputum evaluation THIS SPECIMEN IS ACCEPTABLE FOR SPUTUM CULTURE  Final   Report Status 08/10/2015 FINAL  Final  Culture, respiratory (NON-Expectorated)     Status: None   Collection Time: 08/10/15  7:14 PM  Result Value Ref Range Status   Specimen Description SPUTUM  Final   Special Requests NONE  Final   Gram Stain   Final    FEW WBC PRESENT, PREDOMINANTLY PMN RARE SQUAMOUS EPITHELIAL CELLS PRESENT FEW GRAM POSITIVE COCCI IN PAIRS IN CLUSTERS Performed at [facility]    Culture   Final    NORMAL OROPHARYNGEAL FLORA Performed at [facility]    Report Status 08/13/2015 FINAL  Final     Labs: Basic Metabolic Panel:  Recent Labs Lab 08/11/15 0523 08/12/15 0531 08/13/15 0458 08/14/15 0551 08/15/15 0515 08/16/15 0535  NA 137 139 140 140 137 137  K 3.6 4.1 3.6 4.6 4.0 3.4*  CL 106 106 104 104 102 100*  CO2 27 27 29 30 31 31   GLUCOSE 161* 158* 151* 125* 97 113*  BUN 33* 40* 42* 36* 39* 36*  CREATININE 0.45 0.49 0.48 0.43* 0.57 0.39*  CALCIUM 8.1* 8.1* 8.1* 8.3* 8.0* 7.8*  MG 2.4 2.4  --  2.3  --   --    Liver Function Tests:  Recent Labs Lab 08/11/15  1610 08/15/15 0515  AST 14* 15  ALT 16 15  ALKPHOS 47 42  BILITOT 0.2* 0.3  PROT 5.6* 5.0*  ALBUMIN 2.6* 2.5*   No results for input(s): LIPASE, AMYLASE in the last 168 hours. No results for input(s): AMMONIA in the last 168 hours. CBC:  Recent Labs Lab 08/10/15 0454 08/11/15 0523 08/15/15 0515 08/16/15 0535  WBC 9.4 8.9 12.0* 13.2*  HGB 10.7* 11.0* 11.1* 10.9*  HCT 34.1* 34.9* 35.3* 33.9*  MCV 81.2 82.1 82.9 82.9  PLT PLATELET CLUMPS NOTED ON SMEAR, UNABLE TO ESTIMATE 147* 144* 171   Cardiac Enzymes: No results for input(s): CKTOTAL, CKMB, CKMBINDEX, TROPONINI in the last 168 hours. BNP: BNP (last 3 results) No results for input(s): BNP in the last 8760  hours.  ProBNP (last 3 results) No results for input(s): PROBNP in the last 8760 hours.  CBG: No results for input(s): GLUCAP in the last 168 hours.     SignedAlbertine Grates MD, PhD  [facility] 08/16/2015, 1:07 PM

## 2015-08-16 NOTE — Progress Notes (Signed)
SATURATION QUALIFICATIONS: (This note is used to comply with regulatory documentation for home oxygen)  Patient Saturations on Room Air at Rest = 91%  Patient Saturations on Room Air while Ambulating = 88%  Patient Saturations on 2 Liters of oxygen while Ambulating = 95%  Please briefly explain why patient needs home oxygen: Pt drops when she gets up without oxygen

## 2015-08-16 NOTE — Care Management Note (Signed)
Case Management Note  Patient Details  Name: Paula Bird MRN: [id number] Date of Birth: [birth year]  Subjective/Objective:                    Action/Plan:d/c SNF.   Expected Discharge Date:   (UNKNOWN)               Expected Discharge Plan:  Skilled Nursing Facility  In-House Referral:  Clinical Social Work  Discharge planning Services  CM Consult  Post Acute Care Choice:    Choice offered to:     DME Arranged:    DME Agency:     HH Arranged:    HH Agency:     Status of Service:  Completed, signed off  Medicare Important Message Given:  Yes Date Medicare IM Given:    Medicare IM give by:    Date Additional Medicare IM Given:    Additional Medicare Important Message give by:     If discussed at Long Length of Stay Meetings, dates discussed:    Additional Comments:  Paula Bird, Paula Oravec, RN 08/16/2015, 12:49 PM

## 2015-08-18 ENCOUNTER — Encounter: Payer: Self-pay | Admitting: Adult Health

## 2015-08-18 ENCOUNTER — Non-Acute Institutional Stay (SKILLED_NURSING_FACILITY): Payer: Medicare Other | Admitting: Adult Health

## 2015-08-18 DIAGNOSIS — F329 Major depressive disorder, single episode, unspecified: Secondary | ICD-10-CM | POA: Diagnosis not present

## 2015-08-18 DIAGNOSIS — R5381 Other malaise: Secondary | ICD-10-CM | POA: Diagnosis not present

## 2015-08-18 DIAGNOSIS — F419 Anxiety disorder, unspecified: Secondary | ICD-10-CM | POA: Diagnosis not present

## 2015-08-18 DIAGNOSIS — R51 Headache: Secondary | ICD-10-CM

## 2015-08-18 DIAGNOSIS — J189 Pneumonia, unspecified organism: Secondary | ICD-10-CM | POA: Diagnosis not present

## 2015-08-18 DIAGNOSIS — I480 Paroxysmal atrial fibrillation: Secondary | ICD-10-CM | POA: Diagnosis not present

## 2015-08-18 DIAGNOSIS — K5901 Slow transit constipation: Secondary | ICD-10-CM

## 2015-08-18 DIAGNOSIS — J441 Chronic obstructive pulmonary disease with (acute) exacerbation: Secondary | ICD-10-CM | POA: Diagnosis not present

## 2015-08-18 DIAGNOSIS — R519 Headache, unspecified: Secondary | ICD-10-CM

## 2015-08-18 DIAGNOSIS — F32A Depression, unspecified: Secondary | ICD-10-CM

## 2015-08-18 DIAGNOSIS — K219 Gastro-esophageal reflux disease without esophagitis: Secondary | ICD-10-CM

## 2015-08-18 DIAGNOSIS — E039 Hypothyroidism, unspecified: Secondary | ICD-10-CM

## 2015-08-18 DIAGNOSIS — E559 Vitamin D deficiency, unspecified: Secondary | ICD-10-CM

## 2015-08-18 DIAGNOSIS — G4700 Insomnia, unspecified: Secondary | ICD-10-CM | POA: Diagnosis not present

## 2015-08-18 DIAGNOSIS — J309 Allergic rhinitis, unspecified: Secondary | ICD-10-CM | POA: Diagnosis not present

## 2015-08-18 DIAGNOSIS — I10 Essential (primary) hypertension: Secondary | ICD-10-CM

## 2015-08-18 NOTE — Progress Notes (Signed)
Patient ID: Paula Bird, female   DOB: 1925-04-04, 82 y.o.   MRN: [id number]    DATE:  08/18/15  MRN:  [id number]  BIRTHDAY: 1925-04-04  Facility:  Nursing Home Location:  [facility]  Nursing Home Room Number: 1202-P  LEVEL OF CARE:  SNF (31)  Contact Information    Name Relation Home Work [employer]   Frison,Guy Son [phone]     Ricks,Jane Daughter [phone]  [phone]       Code Status History    Date Active Date Inactive Code Status Order ID Comments User Context   08/08/2015 12:50 PM 08/16/2015  7:28 PM DNR [id number]  Jeralyn BennettEzequiel Zamora, MD Inpatient   08/04/2015  7:05 PM 08/06/2015  1:42 PM DNR [id number]  Briscoe Deutscherimothy S Opyd, MD ED    Questions for Most Recent Historical Code Status (Order [id number])    Question Answer Comment   In the event of cardiac or respiratory ARREST Do not call a "code blue"    In the event of cardiac or respiratory ARREST Do not perform Intubation, CPR, defibrillation or ACLS    In the event of cardiac or respiratory ARREST Use medication by any route, position, wound care, and other measures to relive pain and suffering. May use oxygen, suction and manual treatment of airway obstruction as needed for comfort.     Advance Directive Documentation        Most Recent Value   Type of Advance Directive  Out of facility DNR (pink MOST or yellow form)   Pre-existing out of facility DNR order (yellow form or pink MOST form)     "MOST" Form in Place?         Chief Complaint  Patient presents with  . Hospitalization Follow-up    HISTORY OF PRESENT ILLNESS:  This is an 82 year old female who [facility]. She has PMH of cognitive impairment, hypertension, hypothyroidism, paroxysmal atrial fibrillation and chronic obstructive pulmonary disease. She was treated in the hospital for HCAP. She was started on empiric IV antibiotic therapy with cefepime and vancomycin on admission  then narrowed to oral Augmentin. She has finished antibiotic treatment in the hospital and was discharged on oxygen 2 L/min . She has also received a few doses of Lasix  She has been admitted for a short-term rehabilitation.  PAST MEDICAL HISTORY:  Past Medical History  Diagnosis Date  . Hypertension   . Thyroid disease   . Vertigo      CURRENT MEDICATIONS: Reviewed  Patient's Medications  New Prescriptions   No medications on file  Previous Medications   ACETAMINOPHEN (TYLENOL) 325 MG TABLET    Take 650 mg by mouth every 4 (four) hours as needed for mild pain, moderate pain, fever or headache.    AMIODARONE (PACERONE) 200 MG TABLET    Take 200 mg by mouth daily.    AMITRIPTYLINE (ELAVIL) 75 MG TABLET    Take 75 mg by mouth at bedtime.   ASPIRIN 325 MG TABLET    Take 325 mg by mouth daily.   BISACODYL (DULCOLAX) 10 MG SUPPOSITORY    Place 10 mg rectally daily as needed for moderate constipation.   CHOLECALCIFEROL (VITAMIN D) 1000 UNITS TABLET    Take 2,000 Units by mouth daily.   CRANBERRY 425 MG CAPS    Take 425 mg by mouth 2 (two) times daily.   DILTIAZEM (CARDIZEM) 30 MG TABLET    Take 1 tablet (30  mg total) by mouth 3 (three) times daily.   FAMOTIDINE (PEPCID) 20 MG TABLET    Take 1 tablet (20 mg total) by mouth 2 (two) times daily.   FLUTICASONE (FLONASE) 50 MCG/ACT NASAL SPRAY    Place 2 sprays into both nostrils daily.   GUAIFENESIN 150 MG/15ML LIQD    Take 1 Dose by mouth 4 (four) times daily. Started 05/12 for 10 days   HYDROCHLOROTHIAZIDE (HYDRODIURIL) 25 MG TABLET    Take 1 tablet (25 mg total) by mouth every Monday, Wednesday, and Friday.   IPRATROPIUM-ALBUTEROL (DUONEB) 0.5-2.5 (3) MG/3ML SOLN    Take 3 mLs by nebulization every 4 (four) hours as needed.   LEVOTHYROXINE (SYNTHROID, LEVOTHROID) 25 MCG TABLET    Take 25 mcg by mouth daily before breakfast.   MIRTAZAPINE (REMERON) 7.5 MG TABLET    Take 7.5 mg by mouth at bedtime.   MULTIPLE VITAMINS-MINERALS (MULTIVITAMIN  GUMMIES ADULT PO)    Take 1 tablet by mouth daily.   PAROXETINE (PAXIL) 40 MG TABLET    Take 40 mg by mouth daily.   POLYETHYLENE GLYCOL (MIRALAX / GLYCOLAX) PACKET    Take 17 g by mouth daily.   PROMETHAZINE (PHENERGAN) 25 MG TABLET    Take 25 mg by mouth every 6 (six) hours as needed for nausea or vomiting.   PROTEIN (PROCEL) POWD    Take 2 scoop by mouth 2 (two) times daily.   SACCHAROMYCES BOULARDII (FLORASTOR) 250 MG CAPSULE    Take 250 mg by mouth 2 (two) times daily.   SENNOSIDES-DOCUSATE SODIUM (SENOKOT-S) 8.6-50 MG TABLET    Take 1 tablet by mouth 2 (two) times daily.   SUMATRIPTAN (IMITREX) 25 MG TABLET    Take 25 mg by mouth daily as needed for migraine. May repeat in 2 hours if headache persists or recurs.   TRAZODONE (DESYREL) 100 MG TABLET    Take 100 mg by mouth at bedtime.  Modified Medications   No medications on file  Discontinued Medications   DICLOFENAC SODIUM (VOLTAREN) 1 % GEL    Apply 1 application topically daily. Apply to right knee   LEVOTHYROXINE (SYNTHROID, LEVOTHROID) 50 MCG TABLET    Take 50 mcg by mouth daily before breakfast.   PREDNISONE (DELTASONE) 10 MG TABLET    Take 1 tablet (10 mg total) by mouth daily with breakfast. Take 2tabs on day one , then 1tab on day two, then stop.   PREDNISONE (DELTASONE) 10 MG TABLET    Take 10 mg by mouth daily with breakfast.     No Known Allergies   REVIEW OF SYSTEMS:  GENERAL: no change in appetite, no fatigue, no weight changes, no fever, chills or weakness EYES: Denies change in vision, dry eyes, eye pain, itching or discharge EARS: Denies change in hearing, ringing in ears, or earache NOSE: Denies nasal congestion or epistaxis MOUTH and THROAT: Denies oral discomfort, gingival pain or bleeding, pain from teeth or hoarseness   RESPIRATORY: no  hemoptysis CARDIAC: no chest pain, edema or palpitations GI: no abdominal pain, diarrhea, constipation, heart burn, nausea or vomiting GU: Denies dysuria, frequency,  hematuria, incontinence, or discharge PSYCHIATRIC: Denies feeling of depression or anxiety. No report of hallucinations, insomnia, paranoia, or agitation   PHYSICAL EXAMINATION  GENERAL APPEARANCE: Well nourished. In no acute distress. Normal body habitus SKIN:  Right forearrm has skin tear, no redness, dry HEAD: Normal in size and contour. No evidence of trauma EYES: Lids open and close normally. No blepharitis, entropion or  ectropion. PERRL. Conjunctivae are clear and sclerae are white. Lenses are without opacity EARS: Pinnae are normal. Patient hears normal voice tunes of the examiner MOUTH and THROAT: Lips are without lesions. Oral mucosa is moist and without lesions. Tongue is normal in shape, size, and color and without lesions NECK: supple, trachea midline, no neck masses, no thyroid tenderness, no thyromegaly LYMPHATICS: no LAN in the neck, no supraclavicular LAN RESPIRATORY: breathing is even & unlabored, rales on bilateral lung fields CARDIAC: RRR, no murmur,no extra heart sounds, no edema GI: abdomen soft, normal BS, no masses, no tenderness, no hepatomegaly, no splenomegaly EXTREMITIES:  Able to move X 4 extremities PSYCHIATRIC: Alert and oriented X 3. Affect and behavior are appropriate  LABS/RADIOLOGY: Labs reviewed: Basic Metabolic Panel:  Recent Labs  16/10/96 0523 08/12/15 0531  08/14/15 0551 08/15/15 0515 08/16/15 08/16/15 0535  NA 137 139  < > 140 137 137 137  K 3.6 4.1  < > 4.6 4.0  --  3.4*  CL 106 106  < > 104 102  --  100*  CO2 27 27  < > 30 31  --  31  GLUCOSE 161* 158*  < > 125* 97  --  113*  BUN 33* 40*  < > 36* 39* 36* 36*  CREATININE 0.45 0.49  < > 0.43* 0.57 0.4* 0.39*  CALCIUM 8.1* 8.1*  < > 8.3* 8.0*  --  7.8*  MG 2.4 2.4  --  2.3  --   --   --   < > = values in this interval not displayed. Liver Function Tests:  Recent Labs  08/04/15 1645 08/11/15 0523 08/15/15 0515  AST 20 14* 15  ALT 18 16 15   ALKPHOS 63 47 42  BILITOT 0.3 0.2* 0.3   PROT 7.0 5.6* 5.0*  ALBUMIN 3.4* 2.6* 2.5*   CBC:  Recent Labs  02/08/15 1154 08/04/15 1645  08/11/15 0523 08/15/15 0515 08/16/15 08/16/15 0535  WBC 4.5 4.9  < > 8.9 12.0* 13.2 13.2*  NEUTROABS 3.1 3.1  --   --   --   --   --   HGB 13.0 12.2  < > 11.0* 11.1*  --  10.9*  HCT 40.4 38.1  < > 34.9* 35.3*  --  33.9*  MCV 87.6 81.9  < > 82.1 82.9  --  82.9  PLT 191 Platelet count consistent in citrate 105*  < > 147* 144*  --  171  < > = values in this interval not displayed.  Cardiac Enzymes:  Recent Labs  08/08/15 0101  TROPONINI <0.03   CBG:  Recent Labs  08/05/15 0736 08/06/15 0748  GLUCAP 153* 148*      Dg Chest 2 View  08/11/2015  CLINICAL DATA:  Cough and congestion.  Initial encounter. EXAM: CHEST  2 VIEW COMPARISON:  PA and lateral chest 08/09/2015. FINDINGS: Left pleural effusion and basilar airspace disease are again seen. Aeration appears mildly improved. The right lung remains clear. Heart size is enlarged. No pneumothorax. IMPRESSION: Some improvement in a left pleural effusion and basilar airspace disease. No new abnormality. Electronically Signed   By: Drusilla Kanner M.D.   On: 08/11/2015 18:42   Dg Chest 2 View  08/09/2015  CLINICAL DATA:  Followup pneumonia EXAM: CHEST  2 VIEW COMPARISON:  08/08/2015 FINDINGS: Small to moderate left and minimal right pleural effusions. There is additional airspace opacity at the left lung base consistent with pneumonia. Atelectasis is possible. There is prominence of the right  peritracheal stripe which is stable, vascular in origin based on a prior CTA dated 08/13/2014. Mild linear atelectasis is noted at the right lung base. Remainder of the lungs is clear.  No evidence of pulmonary edema. Cardiac silhouette is mildly enlarged. No mediastinal or hilar masses or convincing adenopathy. IMPRESSION: 1. Persistent left lung base opacity representing a combination of a small to moderate pleural effusion with associated pneumonia or  atelectasis. 2. Minimal right pleural effusion with minor right lung base atelectasis. 3. Mild cardiomegaly.  No pulmonary edema. Electronically Signed   By: Amie Portland M.D.   On: 08/09/2015 09:48   Dg Chest 2 View  08/08/2015  CLINICAL DATA:  SOB, coughing, congestion, chest discomfort for 2 weeks. Pt takes HTN meds. Not diabetic. Nonsmoker. EXAM: CHEST  2 VIEW COMPARISON:  08/04/2015 FINDINGS: Emphysematous changes in the lungs. Opacity in the left lung base could indicate consolidation or atelectasis. This is developing since previous study. No blunting of costophrenic angles. No pneumothorax. Mediastinal contours appear intact. Calcification of the aorta. Degenerative changes in the spine. Multiple thoracic compression fractures, some with kyphoplasty changes. These are unchanged since previous study and likely indicate osteoporosis. IMPRESSION: Emphysematous changes in the lungs. Developing consolidation or atelectasis in the left lung base posteriorly. Electronically Signed   By: Burman Nieves M.D.   On: 08/08/2015 01:04   Dg Chest 2 View  08/04/2015  CLINICAL DATA:  Productive cough. EXAM: CHEST  2 VIEW COMPARISON:  Aug 13, 2014. FINDINGS: The heart size and mediastinal contours are within normal limits. Both lungs are clear. Old left rib fracture is noted. Atherosclerosis of thoracic aorta is noted. Anterior osteophyte formation is noted in lower thoracic spine. IMPRESSION: No active cardiopulmonary disease. Electronically Signed   By: Lupita Raider, M.D.   On: 08/04/2015 16:28   Ct Chest Wo Contrast  08/14/2015  CLINICAL DATA:  Coughing and shortness of breath for 3 weeks, now with some central chest pain. EXAM: CT CHEST WITHOUT CONTRAST TECHNIQUE: Multidetector CT imaging of the chest was performed following the standard protocol without IV contrast. COMPARISON:  Chest x-ray dated 08/11/2015 and chest CT dated 08/13/2014. FINDINGS: Mediastinum/Lymph Nodes: Heart size is upper normal. No  pericardial effusion. Coronary artery calcifications noted. Scattered atherosclerotic changes are seen along the walls of the normal-caliber thoracic aorta. No masses or enlarged lymph nodes seen within the mediastinum or perihilar regions. Trachea and central bronchi are unremarkable. There is collapse of the peripheral bronchi in the left lower lobe due to presumed airspace collapse. Lungs/Pleura: Dense consolidation at the left lung base, most suggestive of airspace collapse with associated volume loss. Left lung is otherwise clear. Smaller consolidation at the right lung base, also likely atelectasis. Small focus of ground-glass opacity within the anterior-medial aspects of the right upper lobe, probably mild edema and/or pneumonitis. Right lung otherwise clear. Upper abdomen: Limited images of the upper abdomen are unremarkable. Probable small stones and/or sludge within the gallbladder, incompletely imaged. Musculoskeletal: Degenerative changes throughout the thoracic and upper lumbar spine. Compression fractures status post vertebroplasty fixation at the thoracolumbar junction. Milder chronic compression deformity within the mid thoracic spine. No acute -appearing osseous abnormality. IMPRESSION: 1. Dense consolidation within the left lower lobe posteriorly, most suggestive of airspace collapse with associated volume loss. This presumed airspace collapse could be related to central obstructing mucous plug or mass. 2. Smaller consolidation at the right lung base, also most likely atelectasis. 3. No evidence of pneumonia within either lung. 4. Probable cholelithiasis, incompletely  imaged. 5. Degenerative changes within the thoracic and upper lumbar spine. Status post vertebroplasties for compression fractures at the thoracolumbar junction. No acute-appearing osseous abnormality seen. Electronically Signed   By: Bary Richard M.D.   On: 08/14/2015 17:51   Dg Esophagus  08/10/2015  CLINICAL DATA:  Difficulty  swallowing solid food for the past couple of years. Food feels like it gets stuck in the proximal esophagus. EXAM: ESOPHOGRAM/BARIUM SWALLOW TECHNIQUE: Single contrast examination was performed using  thin barium. FLUOROSCOPY TIME:  Radiation Exposure Index (as provided by the fluoroscopic device): 7.9 mGy If the device does not provide the exposure index: Fluoroscopy Time:  dictate in minutes and seconds Number of Acquired Images:  0 COMPARISON:  None. FINDINGS: A limited examination was performed in the recumbent LPO position due to patient condition. There is lack of primary peristalsis with tertiary contractions and stasis of contrast in the esophagus. No esophageal fold thickening, stricture or obstruction. A 13 mm barium tablet passed into the stomach with the aid of thin barium. IMPRESSION: Poor esophageal motility. Electronically Signed   By: Leanna Battles M.D.   On: 08/10/2015 11:04    ASSESSMENT/PLAN:  Physical deconditioning - for rehabilitation, PT, OT and ST  HCAP - she was started on empiric IV antibiotic therapy with cefepime and vancomycin on admission then narrowed to oral Augmentin. She has finished antibiotic treatment in the hospital  COPD Exacerbation -  she has finished antibiotics in the hospital; continue O2 @ 2L/min via [place] ; continue Duoneb PRN  Headache - continue sumatriptan succinate 25 mg 1 tab by mouth daily when necessary and may repeat in 2 hours if headache persists    Anxiety - continue Paxil 40 mg 1 tab PO daily  Constipation - continue MiraLAX 17 g by mouth daily and senna S 1 tab by mouth twice a day  Allergic rhinitis - continue Flonase 50 g placed 2 sprays into both nostrils daily  Hypertension - continue hydrochlorothiazide 25 mg 1 tab by mouth every Mondays-Wednesdays-Fridays and diltiazem 30 mg 1 tab by mouth 3 times a day; check BMP  Paroxysmal atrial fibrillation - rate controlled; continue Pacerone 200 mg 1 tab by mouth daily and aspirin 325 mg 1  tab by mouth daily and diltiazem 30 mg 1 tab by mouth twice a day; check CBC  Vitamin D deficiency - continue vitamin D3 2000 units 1 tab by mouth daily  GERD - continue famotidine 20 mg 1 tab by mouth twice a day  Depression - continue Remeron 7.5 mg 1 tab by mouth daily at bedtime and amitriptyline 75 mg 1 tab by mouth daily at bedtime  Insomnia - continue trazodone 100 mg 1 tab by mouth daily at bedtime  Hypothyroidism - continue levothyroxine 50 g 1 tab by mouth daily     Goals of care:  Short-term rehabilitation    Kenard Gower, NP [facility] [phone]

## 2015-08-19 ENCOUNTER — Non-Acute Institutional Stay (SKILLED_NURSING_FACILITY): Payer: Medicare Other | Admitting: Internal Medicine

## 2015-08-19 ENCOUNTER — Encounter: Payer: Self-pay | Admitting: Internal Medicine

## 2015-08-19 DIAGNOSIS — F329 Major depressive disorder, single episode, unspecified: Secondary | ICD-10-CM | POA: Diagnosis not present

## 2015-08-19 DIAGNOSIS — J9600 Acute respiratory failure, unspecified whether with hypoxia or hypercapnia: Secondary | ICD-10-CM

## 2015-08-19 DIAGNOSIS — R5381 Other malaise: Secondary | ICD-10-CM

## 2015-08-19 DIAGNOSIS — D649 Anemia, unspecified: Secondary | ICD-10-CM

## 2015-08-19 DIAGNOSIS — K224 Dyskinesia of esophagus: Secondary | ICD-10-CM

## 2015-08-19 DIAGNOSIS — K5900 Constipation, unspecified: Secondary | ICD-10-CM

## 2015-08-19 DIAGNOSIS — E038 Other specified hypothyroidism: Secondary | ICD-10-CM | POA: Diagnosis not present

## 2015-08-19 DIAGNOSIS — J449 Chronic obstructive pulmonary disease, unspecified: Secondary | ICD-10-CM

## 2015-08-19 DIAGNOSIS — D72829 Elevated white blood cell count, unspecified: Secondary | ICD-10-CM | POA: Diagnosis not present

## 2015-08-19 DIAGNOSIS — E46 Unspecified protein-calorie malnutrition: Secondary | ICD-10-CM

## 2015-08-19 DIAGNOSIS — G4700 Insomnia, unspecified: Secondary | ICD-10-CM | POA: Diagnosis not present

## 2015-08-19 DIAGNOSIS — I480 Paroxysmal atrial fibrillation: Secondary | ICD-10-CM

## 2015-08-19 DIAGNOSIS — B370 Candidal stomatitis: Secondary | ICD-10-CM

## 2015-08-19 DIAGNOSIS — F32A Depression, unspecified: Secondary | ICD-10-CM

## 2015-08-19 DIAGNOSIS — J189 Pneumonia, unspecified organism: Secondary | ICD-10-CM

## 2015-08-19 DIAGNOSIS — I10 Essential (primary) hypertension: Secondary | ICD-10-CM

## 2015-08-19 DIAGNOSIS — R1310 Dysphagia, unspecified: Secondary | ICD-10-CM

## 2015-08-19 LAB — BASIC METABOLIC PANEL
BUN: 20 mg/dL (ref 4–21)
CREATININE: 0.5 mg/dL (ref 0.5–1.1)
Glucose: 91 mg/dL
Potassium: 3.6 mmol/L (ref 3.4–5.3)
Sodium: 143 mmol/L (ref 137–147)

## 2015-08-19 LAB — CBC AND DIFFERENTIAL
HCT: 36 % (ref 36–46)
HEMOGLOBIN: 10.9 g/dL — AB (ref 12.0–16.0)
NEUTROS ABS: 8 /uL
PLATELETS: 85 10*3/uL — AB (ref 150–399)
WBC: 10 10*3/mL

## 2015-08-19 NOTE — Progress Notes (Signed)
LOCATION: [facility]  PCP: [name], [name], MD   Code Status: DNR  Goals of care: Advanced Directive information Advanced Directives 08/19/2015  Does patient have an advance directive? Yes  Type of Advance Directive Out of facility DNR (pink MOST or yellow form)  Does patient want to make changes to advanced directive? No - Patient declined  Copy of advanced directive(s) in chart? Yes       Extended Emergency Contact Information Primary Emergency Contact: [facility] Endoscopy[facility],Guy Address: [address]          [place], [place] [id number] [place] Home Phone: [phone] Relation: Son Secondary Emergency Contact: Ricks,Jane          [place], [place] Home Phone: [phone] Mobile Phone: [phone] Relation: Daughter   No Known Allergies  Chief Complaint  Patient presents with  . New Admit To SNF    New Admission     HPI:  Patient is a 82 y.o. female seen today for short term rehabilitation post hospital admission from 08/08/15-08/16/15 with acute respiratory failure with copd exacerbation and HCAP. She required iv antibiotics and steroids. She also required diuresis. She was noted to have esophageal dysmotility and was seen by SLP. She had persistent cough and was noted to have mucus plug on CT chest. Her ACEI was discontinued and PPI were continued. She has PMH of copd, afib, hypothyroidism, anixety among others. She is seen in her room today. She complaints of ongoing cough.   Review of Systems:  Constitutional: Negative for fever, chills and diaphoresis. Feels weak and tired.  HENT: Negative for headache, congestion, nasal discharge, hearing loss, sore throat. Positive for difficulty swallowing with sensation of food being stuck. Complaints of soreness in her mouth. Eyes: Negative for blurred vision, double vision and discharge. Wears glasses.  Respiratory: Negative for wheezing. Positive for cough with yellow phlegm and oxygen is  helping with shortness of breath. Has dyspnea on minimal exertion.    Cardiovascular: Negative for chest pain, palpitations, leg swelling.  Gastrointestinal: Negative for heartburn, nausea, vomiting, abdominal pain. Last bowel movement was yesterday.  Genitourinary: Negative for dysuria and flank pain.  Musculoskeletal: Negative for back pain, fall in the facility.  Skin: Negative for itching, rash.  Neurological: Positive for dizziness. Psychiatric/Behavioral: Negative for anxiety and memory loss. Positive for situational depression and insomnia.   Past Medical History  Diagnosis Date  . Hypertension   . Thyroid disease   . Vertigo    Past Surgical History  Procedure Laterality Date  . Abdominal hysterectomy    . Appendectomy    . Total hip arthroplasty Left   . Total knee arthroplasty Left    Social History:   reports that she has never smoked. She does not have any smokeless tobacco history on file. She reports that she does not drink alcohol or use illicit drugs.  Family History  Problem Relation Age of Onset  . Hypertension Mother   . Hypertension Son   . Diabetes Neg Hx     Medications:   Medication List       This list is accurate as of: 08/19/15 11:37 AM.  Always use your most recent med list.               acetaminophen 325 MG tablet  Commonly known as:  TYLENOL  Take 650 mg by mouth every 4 (four) hours as needed for mild pain, moderate pain, fever or headache.  amiodarone 200 MG tablet  Commonly known as:  PACERONE  Take 200 mg by mouth daily.     amitriptyline 75 MG tablet  Commonly known as:  ELAVIL  Take 75 mg by mouth at bedtime.     aspirin 325 MG tablet  Take 325 mg by mouth daily.     bisacodyl 10 MG suppository  Commonly known as:  DULCOLAX  Place 10 mg rectally daily as needed for moderate constipation.     cholecalciferol 1000 units tablet  Commonly known as:  VITAMIN D  Take 2,000 Units by mouth daily.     Cranberry 425 MG  Caps  Take 425 mg by mouth 2 (two) times daily.     diltiazem 30 MG tablet  Commonly known as:  CARDIZEM  Take 1 tablet (30 mg total) by mouth 3 (three) times daily.     famotidine 20 MG tablet  Commonly known as:  PEPCID  Take 1 tablet (20 mg total) by mouth 2 (two) times daily.     fluticasone 50 MCG/ACT nasal spray  Commonly known as:  FLONASE  Place 2 sprays into both nostrils daily.     GuaiFENesin 150 MG/15ML Liqd  Take 1 Dose by mouth 4 (four) times daily. Started 05/12 for 10 days     hydrochlorothiazide 25 MG tablet  Commonly known as:  HYDRODIURIL  Take 1 tablet (25 mg total) by mouth every Monday, Wednesday, and Friday.     ipratropium-albuterol 0.5-2.5 (3) MG/3ML Soln  Commonly known as:  DUONEB  Take 3 mLs by nebulization every 4 (four) hours as needed.     levothyroxine 25 MCG tablet  Commonly known as:  SYNTHROID, LEVOTHROID  Take 25 mcg by mouth daily before breakfast.     mirtazapine 7.5 MG tablet  Commonly known as:  REMERON  Take 7.5 mg by mouth at bedtime.     MULTIVITAMIN GUMMIES ADULT PO  Take 1 tablet by mouth daily.     PARoxetine 40 MG tablet  Commonly known as:  PAXIL  Take 40 mg by mouth daily.     polyethylene glycol packet  Commonly known as:  MIRALAX / GLYCOLAX  Take 17 g by mouth daily.     PROCEL Powd  Take 2 scoop by mouth 2 (two) times daily.     promethazine 25 MG tablet  Commonly known as:  PHENERGAN  Take 25 mg by mouth every 6 (six) hours as needed for nausea or vomiting.     saccharomyces boulardii 250 MG capsule  Commonly known as:  FLORASTOR  Take 250 mg by mouth 2 (two) times daily.     sennosides-docusate sodium 8.6-50 MG tablet  Commonly known as:  SENOKOT-S  Take 1 tablet by mouth 2 (two) times daily.     SUMAtriptan 25 MG tablet  Commonly known as:  IMITREX  Take 25 mg by mouth daily as needed for migraine. May repeat in 2 hours if headache persists or recurs.     traZODone 100 MG tablet  Commonly known as:   DESYREL  Take 100 mg by mouth at bedtime.        Immunizations: Immunization History  Administered Date(s) Administered  . PPD Test 08/16/2015  . Tdap 08/20/2014     Physical Exam: Filed Vitals:   08/19/15 1125  BP: 153/73  Pulse: 89  Temp: 97.8 F (36.6 C)  TempSrc: Oral  Resp: 20  Height: 5\' 2"  (1.575 m)  Weight: 138 lb (62.596 kg)  SpO2:  95%   Body mass index is 25.23 kg/(m^2).  General- elderly female, well built, in no acute distress Head- normocephalic, atraumatic Nose- no maxillary or frontal sinus tenderness, no nasal discharge Throat- moist mucus membrane, no oral thrush or open mouth sores noted, adentulous Eyes- PERRLA, EOMI, no pallor, no icterus, no discharge, normal conjunctiva, normal sclera Neck- no cervical lymphadenopathy Cardiovascular- normal s1,s2, no murmur, no leg edema Respiratory- bilateral poor air movement with rhonchi present, no wheeze, no crackles, some use of accessory muscles, on o2 and SOB in between sentences Abdomen- bowel sounds present, soft, non tender Musculoskeletal- able to move all 4 extremities, generalized weakness, arthritis changes to her fingers Neurological- alert and oriented to person, place and time Skin- warm and dry, easy bruising, dressing to right forearm Psychiatry- normal mood and affect    Labs reviewed: Basic Metabolic Panel:  Recent Labs  16/10/96 0523 08/12/15 0531  08/14/15 0551 08/15/15 0515 08/16/15 08/16/15 0535  NA 137 139  < > 140 137 137 137  K 3.6 4.1  < > 4.6 4.0  --  3.4*  CL 106 106  < > 104 102  --  100*  CO2 27 27  < > 30 31  --  31  GLUCOSE 161* 158*  < > 125* 97  --  113*  BUN 33* 40*  < > 36* 39* 36* 36*  CREATININE 0.45 0.49  < > 0.43* 0.57 0.4* 0.39*  CALCIUM 8.1* 8.1*  < > 8.3* 8.0*  --  7.8*  MG 2.4 2.4  --  2.3  --   --   --   < > = values in this interval not displayed. Liver Function Tests:  Recent Labs  08/04/15 1645 08/11/15 0523 08/15/15 0515  AST 20 14* 15    ALT 18 16 15   ALKPHOS 63 47 42  BILITOT 0.3 0.2* 0.3  PROT 7.0 5.6* 5.0*  ALBUMIN 3.4* 2.6* 2.5*   No results for input(s): LIPASE, AMYLASE in the last 8760 hours. No results for input(s): AMMONIA in the last 8760 hours. CBC:  Recent Labs  02/08/15 1154 08/04/15 1645  08/11/15 0523 08/15/15 0515 08/16/15 08/16/15 0535  WBC 4.5 4.9  < > 8.9 12.0* 13.2 13.2*  NEUTROABS 3.1 3.1  --   --   --   --   --   HGB 13.0 12.2  < > 11.0* 11.1*  --  10.9*  HCT 40.4 38.1  < > 34.9* 35.3*  --  33.9*  MCV 87.6 81.9  < > 82.1 82.9  --  82.9  PLT 191 Platelet count consistent in citrate 105*  < > 147* 144*  --  171  < > = values in this interval not displayed. Cardiac Enzymes:  Recent Labs  08/08/15 0101  TROPONINI <0.03   BNP: Invalid input(s): POCBNP CBG:  Recent Labs  08/19/14 1352 08/05/15 0736 08/06/15 0748  GLUCAP 97 153* 148*    Radiological Exams: Dg Chest 2 View  08/11/2015  CLINICAL DATA:  Cough and congestion.  Initial encounter. EXAM: CHEST  2 VIEW COMPARISON:  PA and lateral chest 08/09/2015. FINDINGS: Left pleural effusion and basilar airspace disease are again seen. Aeration appears mildly improved. The right lung remains clear. Heart size is enlarged. No pneumothorax. IMPRESSION: Some improvement in a left pleural effusion and basilar airspace disease. No new abnormality. Electronically Signed   By: Drusilla Kanner M.D.   On: 08/11/2015 18:42   Dg Chest 2 View  08/09/2015  CLINICAL DATA:  Followup pneumonia EXAM: CHEST  2 VIEW COMPARISON:  08/08/2015 FINDINGS: Small to moderate left and minimal right pleural effusions. There is additional airspace opacity at the left lung base consistent with pneumonia. Atelectasis is possible. There is prominence of the right peritracheal stripe which is stable, vascular in origin based on a prior CTA dated 08/13/2014. Mild linear atelectasis is noted at the right lung base. Remainder of the lungs is clear.  No evidence of pulmonary  edema. Cardiac silhouette is mildly enlarged. No mediastinal or hilar masses or convincing adenopathy. IMPRESSION: 1. Persistent left lung base opacity representing a combination of a small to moderate pleural effusion with associated pneumonia or atelectasis. 2. Minimal right pleural effusion with minor right lung base atelectasis. 3. Mild cardiomegaly.  No pulmonary edema. Electronically Signed   By: Amie Portland M.D.   On: 08/09/2015 09:48   Dg Chest 2 View  08/08/2015  CLINICAL DATA:  SOB, coughing, congestion, chest discomfort for 2 weeks. Pt takes HTN meds. Not diabetic. Nonsmoker. EXAM: CHEST  2 VIEW COMPARISON:  08/04/2015 FINDINGS: Emphysematous changes in the lungs. Opacity in the left lung base could indicate consolidation or atelectasis. This is developing since previous study. No blunting of costophrenic angles. No pneumothorax. Mediastinal contours appear intact. Calcification of the aorta. Degenerative changes in the spine. Multiple thoracic compression fractures, some with kyphoplasty changes. These are unchanged since previous study and likely indicate osteoporosis. IMPRESSION: Emphysematous changes in the lungs. Developing consolidation or atelectasis in the left lung base posteriorly. Electronically Signed   By: Burman Nieves M.D.   On: 08/08/2015 01:04   Dg Chest 2 View  08/04/2015  CLINICAL DATA:  Productive cough. EXAM: CHEST  2 VIEW COMPARISON:  Aug 13, 2014. FINDINGS: The heart size and mediastinal contours are within normal limits. Both lungs are clear. Old left rib fracture is noted. Atherosclerosis of thoracic aorta is noted. Anterior osteophyte formation is noted in lower thoracic spine. IMPRESSION: No active cardiopulmonary disease. Electronically Signed   By: Lupita Raider, M.D.   On: 08/04/2015 16:28   Ct Chest Wo Contrast  08/14/2015  CLINICAL DATA:  Coughing and shortness of breath for 3 weeks, now with some central chest pain. EXAM: CT CHEST WITHOUT CONTRAST  TECHNIQUE: Multidetector CT imaging of the chest was performed following the standard protocol without IV contrast. COMPARISON:  Chest x-ray dated 08/11/2015 and chest CT dated 08/13/2014. FINDINGS: Mediastinum/Lymph Nodes: Heart size is upper normal. No pericardial effusion. Coronary artery calcifications noted. Scattered atherosclerotic changes are seen along the walls of the normal-caliber thoracic aorta. No masses or enlarged lymph nodes seen within the mediastinum or perihilar regions. Trachea and central bronchi are unremarkable. There is collapse of the peripheral bronchi in the left lower lobe due to presumed airspace collapse. Lungs/Pleura: Dense consolidation at the left lung base, most suggestive of airspace collapse with associated volume loss. Left lung is otherwise clear. Smaller consolidation at the right lung base, also likely atelectasis. Small focus of ground-glass opacity within the anterior-medial aspects of the right upper lobe, probably mild edema and/or pneumonitis. Right lung otherwise clear. Upper abdomen: Limited images of the upper abdomen are unremarkable. Probable small stones and/or sludge within the gallbladder, incompletely imaged. Musculoskeletal: Degenerative changes throughout the thoracic and upper lumbar spine. Compression fractures status post vertebroplasty fixation at the thoracolumbar junction. Milder chronic compression deformity within the mid thoracic spine. No acute -appearing osseous abnormality. IMPRESSION: 1. Dense consolidation within the left lower lobe posteriorly, most suggestive  of airspace collapse with associated volume loss. This presumed airspace collapse could be related to central obstructing mucous plug or mass. 2. Smaller consolidation at the right lung base, also most likely atelectasis. 3. No evidence of pneumonia within either lung. 4. Probable cholelithiasis, incompletely imaged. 5. Degenerative changes within the thoracic and upper lumbar spine.  Status post vertebroplasties for compression fractures at the thoracolumbar junction. No acute-appearing osseous abnormality seen. Electronically Signed   By: Bary Richard M.D.   On: 08/14/2015 17:51   Dg Esophagus  08/10/2015  CLINICAL DATA:  Difficulty swallowing solid food for the past couple of years. Food feels like it gets stuck in the proximal esophagus. EXAM: ESOPHOGRAM/BARIUM SWALLOW TECHNIQUE: Single contrast examination was performed using  thin barium. FLUOROSCOPY TIME:  Radiation Exposure Index (as provided by the fluoroscopic device): 7.9 mGy If the device does not provide the exposure index: Fluoroscopy Time:  dictate in minutes and seconds Number of Acquired Images:  0 COMPARISON:  None. FINDINGS: A limited examination was performed in the recumbent LPO position due to patient condition. There is lack of primary peristalsis with tertiary contractions and stasis of contrast in the esophagus. No esophageal fold thickening, stricture or obstruction. A 13 mm barium tablet passed into the stomach with the aid of thin barium. IMPRESSION: Poor esophageal motility. Electronically Signed   By: Leanna Battles M.D.   On: 08/10/2015 11:04    Assessment/Plan  Physical deconditioning Will have her work with physical therapy and occupational therapy team to help with gait training and muscle strengthening exercises.fall precautions. Skin care. Encourage to be out of bed.   Acute respiratory failure In setting of copd exacerbation and HCAP. Completed her antibiotics and prednisone. Continue oxygen and wean off as tolerated. Encouraged to use incentive spirometer   COPD With recent exacerbation. Completed her po prednisone. Continue her bronchodilators. Continue o2 and wean as tolerated. Continue robitussin DM for cough  afib Rate controlled continue pacerone, diltiazem and aspirin 325 mg daily  Depression Continue paroxetine 40 mg daily, amitriptyline and monitor mood, get psychiatry  consult  HTN elevtaed bp, check bp bid x 1 week. Continue hctz 25 mg three days a week  Insomnia On trazodone 50 mg daily, change this to 75 mg daily and monitor  Oral thrush No visible thrush. With her recent prednisone use and c/o soreness in the mouth, start nystain swish and spit and maintain oral hygiene  Dysphagia To be followed by SLP team, continue dysphagia diet and aspiration precautions  Esophageal dysmotility Persists, continue dysphagia diet and to work with SLP team. Continue famotidine. Sitting upright with and after meals encouraged  Protein calorie malnutrition Continue MVI and protein supplement, get dietary consult  Leukocytosis Afebrile, likely from being on prednisone. Monitor cbc  Anemia Monitor cbc  Hypothyroidism Continue synthroid daily and monitor  Constipation Stable, continue miralax daily, senna s bid with prn dulcolax   Goals of care: short term rehabilitation   Labs/tests ordered: cbc, bmp  Family/ staff Communication: reviewed care plan with patient and nursing supervisor    Oneal Grout, MD Internal Medicine [facility] [address] [place], [place] [address] Cell Phone (Monday-Friday 8 am - 5 pm): [phone] On Call: [phone] and follow prompts after 5 pm and on weekends Office Phone: [phone] Office Fax: [phone]

## 2015-08-30 ENCOUNTER — Encounter: Payer: Self-pay | Admitting: Adult Health

## 2015-08-30 ENCOUNTER — Non-Acute Institutional Stay (SKILLED_NURSING_FACILITY): Payer: Medicare Other | Admitting: Adult Health

## 2015-08-30 DIAGNOSIS — K5901 Slow transit constipation: Secondary | ICD-10-CM | POA: Diagnosis not present

## 2015-08-30 DIAGNOSIS — E46 Unspecified protein-calorie malnutrition: Secondary | ICD-10-CM

## 2015-08-30 DIAGNOSIS — E039 Hypothyroidism, unspecified: Secondary | ICD-10-CM

## 2015-08-30 DIAGNOSIS — F329 Major depressive disorder, single episode, unspecified: Secondary | ICD-10-CM | POA: Diagnosis not present

## 2015-08-30 DIAGNOSIS — I10 Essential (primary) hypertension: Secondary | ICD-10-CM | POA: Diagnosis not present

## 2015-08-30 DIAGNOSIS — M1990 Unspecified osteoarthritis, unspecified site: Secondary | ICD-10-CM | POA: Insufficient documentation

## 2015-08-30 DIAGNOSIS — G4700 Insomnia, unspecified: Secondary | ICD-10-CM

## 2015-08-30 DIAGNOSIS — K635 Polyp of colon: Secondary | ICD-10-CM | POA: Insufficient documentation

## 2015-08-30 DIAGNOSIS — I480 Paroxysmal atrial fibrillation: Secondary | ICD-10-CM | POA: Diagnosis not present

## 2015-08-30 DIAGNOSIS — F32A Depression, unspecified: Secondary | ICD-10-CM

## 2015-08-30 DIAGNOSIS — J449 Chronic obstructive pulmonary disease, unspecified: Secondary | ICD-10-CM

## 2015-08-30 DIAGNOSIS — R51 Headache: Secondary | ICD-10-CM | POA: Diagnosis not present

## 2015-08-30 DIAGNOSIS — K219 Gastro-esophageal reflux disease without esophagitis: Secondary | ICD-10-CM | POA: Diagnosis not present

## 2015-08-30 DIAGNOSIS — R519 Headache, unspecified: Secondary | ICD-10-CM

## 2015-08-30 DIAGNOSIS — F419 Anxiety disorder, unspecified: Secondary | ICD-10-CM

## 2015-08-30 DIAGNOSIS — E559 Vitamin D deficiency, unspecified: Secondary | ICD-10-CM

## 2015-08-30 DIAGNOSIS — R5381 Other malaise: Secondary | ICD-10-CM

## 2015-08-30 DIAGNOSIS — K30 Functional dyspepsia: Secondary | ICD-10-CM | POA: Insufficient documentation

## 2015-08-30 DIAGNOSIS — L409 Psoriasis, unspecified: Secondary | ICD-10-CM | POA: Insufficient documentation

## 2015-08-30 DIAGNOSIS — J309 Allergic rhinitis, unspecified: Secondary | ICD-10-CM

## 2015-08-30 NOTE — Progress Notes (Signed)
Patient ID: Paula Bird, female   DOB: 1925/04/13, 82 y.o.   MRN: [id number]    DATE:   08/30/15  MRN:  [id number]  BIRTHDAY: Aug 24, 1925  Facility:  Nursing Home Location:  [facility]  Nursing Home Room Number: 1202-P  LEVEL OF CARE:  SNF (31)  Contact Information    Name Relation Home Work [place] Son [phone]     Ricks,Jane Daughter [phone]  [phone]       Code Status History    Date Active Date Inactive Code Status Order ID Comments User Context   08/08/2015 12:50 PM 08/16/2015  7:28 PM DNR [id number]  Jeralyn Bennett, MD Inpatient   08/04/2015  7:05 PM 08/06/2015  1:42 PM DNR [id number]  Briscoe Deutscher, MD ED    Questions for Most Recent Historical Code Status (Order [id number])    Question Answer Comment   In the event of cardiac or respiratory ARREST Do not call a "code blue"    In the event of cardiac or respiratory ARREST Do not perform Intubation, CPR, defibrillation or ACLS    In the event of cardiac or respiratory ARREST Use medication by any route, position, wound care, and other measures to relive pain and suffering. May use oxygen, suction and manual treatment of airway obstruction as needed for comfort.     Advance Directive Documentation        Most Recent Value   Type of Advance Directive  Out of facility DNR (pink MOST or yellow form)   Pre-existing out of facility DNR order (yellow form or pink MOST form)     "MOST" Form in Place?         Chief Complaint  Patient presents with  . Discharge Note    HISTORY OF PRESENT ILLNESS:  This is an 82 year old female who is for discharge home with Home health PT for endurance, OT for ADLs, CNA for showers, ST for cognition and skilled Nurse for wound care.  She has been admitted to [facility] on 08/16/15 from [facility]. She has PMH of cognitive impairment, hypertension, hypothyroidism, paroxysmal atrial fibrillation and chronic obstructive pulmonary disease.  She was treated in the hospital for HCAP. She was started on empiric IV antibiotic therapy with cefepime and vancomycin on admission then narrowed to oral Augmentin. She has finished antibiotic treatment in the hospital and was discharged on oxygen 2 L/min . She has also received a few doses of Lasix.  She was developed shingles At the mid back and has completed Valacyclovir course while @ [address].  Patient was admitted to this facility for short-term rehabilitation after the patient's recent hospitalization.  Patient has completed SNF rehabilitation and therapy has cleared the patient for discharge.   PAST MEDICAL HISTORY:  Past Medical History  Diagnosis Date  . Hypertension   . Thyroid disease   . Vertigo   . Physical deconditioning   . Leukocytosis   . Acute and chronic respiratory failure, unspecified whether with hypoxia or hypercapnia (HCC)   . Protein calorie malnutrition (HCC)   . Oral thrush   . Insomnia   . PAF (paroxysmal atrial fibrillation) (HCC)   . Depression   . COPD (chronic obstructive pulmonary disease) (HCC)   . Dysphagia   . Constipation   . Esophageal dysmotility   . Anemia      CURRENT MEDICATIONS: Reviewed  Patient's Medications  New Prescriptions   No medications on file  Previous Medications   ACETAMINOPHEN (  TYLENOL) 325 MG TABLET    Take 650 mg by mouth every 4 (four) hours as needed for mild pain, moderate pain, fever or headache.    AMIODARONE (PACERONE) 200 MG TABLET    Take 200 mg by mouth daily.    AMITRIPTYLINE (ELAVIL) 75 MG TABLET    Take 75 mg by mouth at bedtime.   ASPIRIN 325 MG TABLET    Take 325 mg by mouth daily.   BISACODYL (DULCOLAX) 10 MG SUPPOSITORY    Place 10 mg rectally daily as needed for moderate constipation.   CHOLECALCIFEROL (VITAMIN D3) 2000 UNITS TABS    Take 1 tablet by mouth daily.   CRANBERRY 425 MG CAPS    Take 425 mg by mouth 2 (two) times daily.   DILTIAZEM (CARDIZEM) 30 MG TABLET    Take 1 tablet (30 mg  total) by mouth 3 (three) times daily.   FAMOTIDINE (PEPCID) 20 MG TABLET    Take 1 tablet (20 mg total) by mouth 2 (two) times daily.   FLUTICASONE (FLONASE) 50 MCG/ACT NASAL SPRAY    Place 2 sprays into both nostrils daily.   GUAIFENESIN 150 MG/15ML LIQD    Take 1 Dose by mouth 4 (four) times daily. Started 05/12 for 10 days   HYDROCHLOROTHIAZIDE (HYDRODIURIL) 25 MG TABLET    Take 1 tablet (25 mg total) by mouth every Monday, Wednesday, and Friday.   IPRATROPIUM-ALBUTEROL (DUONEB) 0.5-2.5 (3) MG/3ML SOLN    Take 3 mLs by nebulization every 4 (four) hours as needed.   LEVOTHYROXINE (SYNTHROID, LEVOTHROID) 25 MCG TABLET    Take 25 mcg by mouth daily before breakfast.   MIRTAZAPINE (REMERON) 7.5 MG TABLET    Take 7.5 mg by mouth at bedtime.   MULTIPLE VITAMINS-MINERALS (MULTIVITAMIN GUMMIES ADULT PO)    Take 1 tablet by mouth daily.   NYSTATIN (MYCOSTATIN) 100000 UNIT/ML SUSPENSION    Take 5 mLs by mouth 2 (two) times daily.   PAROXETINE (PAXIL) 40 MG TABLET    Take 40 mg by mouth daily.   POLYETHYLENE GLYCOL (MIRALAX / GLYCOLAX) PACKET    Take 17 g by mouth daily.   PROMETHAZINE (PHENERGAN) 25 MG TABLET    Take 25 mg by mouth every 6 (six) hours as needed for nausea or vomiting.   PROTEIN (PROCEL) POWD    Take 2 scoop by mouth 2 (two) times daily.   SACCHAROMYCES BOULARDII (FLORASTOR) 250 MG CAPSULE    Take 250 mg by mouth 2 (two) times daily.   SENNOSIDES-DOCUSATE SODIUM (SENOKOT-S) 8.6-50 MG TABLET    Take 1 tablet by mouth 2 (two) times daily.   SUMATRIPTAN (IMITREX) 25 MG TABLET    Take 25 mg by mouth daily as needed for migraine. May repeat in 2 hours if headache persists or recurs.   TRAZODONE (DESYREL) 150 MG TABLET    Take 75 mg by mouth at bedtime. Take 1/2 of a 150 mg tablet to - 75 mg po QHS  Modified Medications   No medications on file  Discontinued Medications   CHOLECALCIFEROL (VITAMIN D) 1000 UNITS TABLET    Take 2,000 Units by mouth daily.   TRAZODONE (DESYREL) 100 MG TABLET     Take 100 mg by mouth at bedtime.     No Known Allergies   REVIEW OF SYSTEMS:  GENERAL: no change in appetite, no fatigue, no weight changes, no fever, chills or weakness EYES: Denies change in vision, dry eyes, eye pain, itching or discharge EARS: Denies change in  hearing, ringing in ears, or earache NOSE: Denies nasal congestion or epistaxis MOUTH and THROAT: Denies oral discomfort, gingival pain or bleeding, pain from teeth or hoarseness   RESPIRATORY: no  hemoptysis CARDIAC: no chest pain, edema or palpitations GI: no abdominal pain, diarrhea, constipation, heart burn, nausea or vomiting GU: Denies dysuria, frequency, hematuria, incontinence, or discharge PSYCHIATRIC: Denies feeling of depression or anxiety. No report of hallucinations, insomnia, paranoia, or agitation   PHYSICAL EXAMINATION  GENERAL APPEARANCE: Well nourished. In no acute distress. Normal body habitus SKIN:  Right forearrm has skin tear, no redness, dry HEAD: Normal in size and contour. No evidence of trauma EYES: Lids open and close normally. No blepharitis, entropion or ectropion. PERRL. Conjunctivae are clear and sclerae are white. Lenses are without opacity EARS: Pinnae are normal. Patient hears normal voice tunes of the examiner MOUTH and THROAT: Lips are without lesions. Oral mucosa is moist and without lesions. Tongue is normal in shape, size, and color and without lesions NECK: supple, trachea midline, no neck masses, no thyroid tenderness, no thyromegaly LYMPHATICS: no LAN in the neck, no supraclavicular LAN RESPIRATORY: breathing is even & unlabored, rales on bilateral lung fields CARDIAC: RRR, no murmur,no extra heart sounds, no edema GI: abdomen soft, normal BS, no masses, no tenderness, no hepatomegaly, no splenomegaly EXTREMITIES:  Able to move X 4 extremities PSYCHIATRIC: Alert and oriented X 3. Affect and behavior are appropriate  LABS/RADIOLOGY: Labs reviewed: Basic Metabolic  Panel:  Recent Labs  08/11/15 0523 08/12/15 0531  08/14/15 0551 08/15/15 0515 08/16/15 08/16/15 0535 08/19/15  NA 137 139  < > 140 137 137 137 143  K 3.6 4.1  < > 4.6 4.0  --  3.4* 3.6  CL 106 106  < > 104 102  --  100*  --   CO2 27 27  < > 30 31  --  31  --   GLUCOSE 161* 158*  < > 125* 97  --  113*  --   BUN 33* 40*  < > 36* 39* 36* 36* 20  CREATININE 0.45 0.49  < > 0.43* 0.57 0.4* 0.39* 0.5  CALCIUM 8.1* 8.1*  < > 8.3* 8.0*  --  7.8*  --   MG 2.4 2.4  --  2.3  --   --   --   --   < > = values in this interval not displayed. Liver Function Tests:  Recent Labs  08/04/15 1645 08/11/15 0523 08/15/15 0515  AST 20 14* 15  ALT 18 16 15   ALKPHOS 63 47 42  BILITOT 0.3 0.2* 0.3  PROT 7.0 5.6* 5.0*  ALBUMIN 3.4* 2.6* 2.5*   CBC:  Recent Labs  02/08/15 1154 08/04/15 1645  08/11/15 0523 08/15/15 0515 08/16/15 08/16/15 0535 08/19/15  WBC 4.5 4.9  < > 8.9 12.0* 13.2 13.2* 10.0  NEUTROABS 3.1 3.1  --   --   --   --   --  8  HGB 13.0 12.2  < > 11.0* 11.1*  --  10.9* 10.9*  HCT 40.4 38.1  < > 34.9* 35.3*  --  33.9* 36  MCV 87.6 81.9  < > 82.1 82.9  --  82.9  --   PLT 191 Platelet count consistent in citrate 105*  < > 147* 144*  --  171 85*  < > = values in this interval not displayed.  Cardiac Enzymes:  Recent Labs  08/08/15 0101  TROPONINI <0.03   CBG:  Recent Labs  08/05/15  0736 08/06/15 0748  GLUCAP 153* 148*      Dg Chest 2 View  08/11/2015  CLINICAL DATA:  Cough and congestion.  Initial encounter. EXAM: CHEST  2 VIEW COMPARISON:  PA and lateral chest 08/09/2015. FINDINGS: Left pleural effusion and basilar airspace disease are again seen. Aeration appears mildly improved. The right lung remains clear. Heart size is enlarged. No pneumothorax. IMPRESSION: Some improvement in a left pleural effusion and basilar airspace disease. No new abnormality. Electronically Signed   By: Drusilla Kannerhomas  Dalessio M.D.   On: 08/11/2015 18:42   Dg Chest 2 View  08/09/2015  CLINICAL  DATA:  Followup pneumonia EXAM: CHEST  2 VIEW COMPARISON:  08/08/2015 FINDINGS: Small to moderate left and minimal right pleural effusions. There is additional airspace opacity at the left lung base consistent with pneumonia. Atelectasis is possible. There is prominence of the right peritracheal stripe which is stable, vascular in origin based on a prior CTA dated 08/13/2014. Mild linear atelectasis is noted at the right lung base. Remainder of the lungs is clear.  No evidence of pulmonary edema. Cardiac silhouette is mildly enlarged. No mediastinal or hilar masses or convincing adenopathy. IMPRESSION: 1. Persistent left lung base opacity representing a combination of a small to moderate pleural effusion with associated pneumonia or atelectasis. 2. Minimal right pleural effusion with minor right lung base atelectasis. 3. Mild cardiomegaly.  No pulmonary edema. Electronically Signed   By: Amie Portlandavid  Ormond M.D.   On: 08/09/2015 09:48   Dg Chest 2 View  08/08/2015  CLINICAL DATA:  SOB, coughing, congestion, chest discomfort for 2 weeks. Pt takes HTN meds. Not diabetic. Nonsmoker. EXAM: CHEST  2 VIEW COMPARISON:  08/04/2015 FINDINGS: Emphysematous changes in the lungs. Opacity in the left lung base could indicate consolidation or atelectasis. This is developing since previous study. No blunting of costophrenic angles. No pneumothorax. Mediastinal contours appear intact. Calcification of the aorta. Degenerative changes in the spine. Multiple thoracic compression fractures, some with kyphoplasty changes. These are unchanged since previous study and likely indicate osteoporosis. IMPRESSION: Emphysematous changes in the lungs. Developing consolidation or atelectasis in the left lung base posteriorly. Electronically Signed   By: Burman NievesWilliam  Stevens M.D.   On: 08/08/2015 01:04   Dg Chest 2 View  08/04/2015  CLINICAL DATA:  Productive cough. EXAM: CHEST  2 VIEW COMPARISON:  Aug 13, 2014. FINDINGS: The heart size and  mediastinal contours are within normal limits. Both lungs are clear. Old left rib fracture is noted. Atherosclerosis of thoracic aorta is noted. Anterior osteophyte formation is noted in lower thoracic spine. IMPRESSION: No active cardiopulmonary disease. Electronically Signed   By: Lupita RaiderJames  Green Jr, M.D.   On: 08/04/2015 16:28   Ct Chest Wo Contrast  08/14/2015  CLINICAL DATA:  Coughing and shortness of breath for 3 weeks, now with some central chest pain. EXAM: CT CHEST WITHOUT CONTRAST TECHNIQUE: Multidetector CT imaging of the chest was performed following the standard protocol without IV contrast. COMPARISON:  Chest x-ray dated 08/11/2015 and chest CT dated 08/13/2014. FINDINGS: Mediastinum/Lymph Nodes: Heart size is upper normal. No pericardial effusion. Coronary artery calcifications noted. Scattered atherosclerotic changes are seen along the walls of the normal-caliber thoracic aorta. No masses or enlarged lymph nodes seen within the mediastinum or perihilar regions. Trachea and central bronchi are unremarkable. There is collapse of the peripheral bronchi in the left lower lobe due to presumed airspace collapse. Lungs/Pleura: Dense consolidation at the left lung base, most suggestive of airspace collapse with associated volume loss.  Left lung is otherwise clear. Smaller consolidation at the right lung base, also likely atelectasis. Small focus of ground-glass opacity within the anterior-medial aspects of the right upper lobe, probably mild edema and/or pneumonitis. Right lung otherwise clear. Upper abdomen: Limited images of the upper abdomen are unremarkable. Probable small stones and/or sludge within the gallbladder, incompletely imaged. Musculoskeletal: Degenerative changes throughout the thoracic and upper lumbar spine. Compression fractures status post vertebroplasty fixation at the thoracolumbar junction. Milder chronic compression deformity within the mid thoracic spine. No acute -appearing osseous  abnormality. IMPRESSION: 1. Dense consolidation within the left lower lobe posteriorly, most suggestive of airspace collapse with associated volume loss. This presumed airspace collapse could be related to central obstructing mucous plug or mass. 2. Smaller consolidation at the right lung base, also most likely atelectasis. 3. No evidence of pneumonia within either lung. 4. Probable cholelithiasis, incompletely imaged. 5. Degenerative changes within the thoracic and upper lumbar spine. Status post vertebroplasties for compression fractures at the thoracolumbar junction. No acute-appearing osseous abnormality seen. Electronically Signed   By: Bary Richard M.D.   On: 08/14/2015 17:51   Dg Esophagus  08/10/2015  CLINICAL DATA:  Difficulty swallowing solid food for the past couple of years. Food feels like it gets stuck in the proximal esophagus. EXAM: ESOPHOGRAM/BARIUM SWALLOW TECHNIQUE: Single contrast examination was performed using  thin barium. FLUOROSCOPY TIME:  Radiation Exposure Index (as provided by the fluoroscopic device): 7.9 mGy If the device does not provide the exposure index: Fluoroscopy Time:  dictate in minutes and seconds Number of Acquired Images:  0 COMPARISON:  None. FINDINGS: A limited examination was performed in the recumbent LPO position due to patient condition. There is lack of primary peristalsis with tertiary contractions and stasis of contrast in the esophagus. No esophageal fold thickening, stricture or obstruction. A 13 mm barium tablet passed into the stomach with the aid of thin barium. IMPRESSION: Poor esophageal motility. Electronically Signed   By: Leanna Battles M.D.   On: 08/10/2015 11:04    ASSESSMENT/PLAN:  Physical deconditioning - for Home health  PT, OT, skilled Nurse, CNA and ST  HCAP - she was started on empiric IV antibiotic therapy with cefepime and vancomycin on admission then narrowed to oral Augmentin. She has finished antibiotic treatment in the hospital;  resolved  COPD -  continue Duoneb PRN  Headache - continue sumatriptan succinate 25 mg 1 tab by mouth daily when necessary and may repeat in 2 hours if headache persists    Anxiety - mood is stable; continue Paxil 40 mg 1 tab PO daily  Constipation - continue MiraLAX 17 g by mouth daily and senna S 1 tab by mouth twice a day  Allergic rhinitis - continue Flonase 50 g placed 2 sprays into both nostrils daily  Hypertension - continue hydrochlorothiazide 25 mg 1 tab by mouth every Mondays-Wednesdays-Fridays and diltiazem 30 mg 1 tab by mouth 3 times a day  Paroxysmal atrial fibrillation - rate controlled; continue Pacerone 200 mg 1 tab by mouth daily and aspirin 325 mg 1 tab by mouth daily and diltiazem 30 mg 1 tab by mouth twice a day  Vitamin D deficiency - continue vitamin D3 2000 units 1 tab by mouth daily  GERD - continue famotidine 20 mg 1 tab by mouth twice a day  Depression - continue Remeron 7.5 mg 1 tab by mouth daily at bedtime and amitriptyline 75 mg 1 tab by mouth daily at bedtime  Insomnia - continue trazodone 100 mg 1  tab by mouth daily at bedtime  Hypothyroidism - recently decreased levothyroxine to 25 g 1 tab by mouth daily; for tsh in 1 month Lab Results  Component Value Date   TSH 0.040* 08/12/2015   Protein calorie malnutrition, severe - albumin 2.5; continue Procel 2 scoops by mouth twice a day                                                                                             I have filled out patient's discharge paperwork and written prescriptions.  Patient will receive home health PT, OT, ST, Skilled Nursing and CNA.  DME provided:  none  Total discharge time: Less than 30 minutes  Discharge time involved coordination of the discharge process with social worker, nursing staff and therapy department. Medical justification for home health services verified.    Kenard Gower, NP [employer] [phone]

## 2015-10-08 ENCOUNTER — Observation Stay (HOSPITAL_COMMUNITY)
Admission: EM | Admit: 2015-10-08 | Discharge: 2015-10-10 | Disposition: A | Discharge status: Skilled Nursing Facility | Payer: Medicare Other | Attending: Internal Medicine | Admitting: Internal Medicine

## 2015-10-08 ENCOUNTER — Encounter (HOSPITAL_COMMUNITY): Payer: Self-pay | Admitting: Emergency Medicine

## 2015-10-08 ENCOUNTER — Emergency Department (HOSPITAL_COMMUNITY): Payer: Medicare Other

## 2015-10-08 DIAGNOSIS — E039 Hypothyroidism, unspecified: Secondary | ICD-10-CM | POA: Diagnosis not present

## 2015-10-08 DIAGNOSIS — I10 Essential (primary) hypertension: Secondary | ICD-10-CM | POA: Insufficient documentation

## 2015-10-08 DIAGNOSIS — Z79899 Other long term (current) drug therapy: Secondary | ICD-10-CM | POA: Insufficient documentation

## 2015-10-08 DIAGNOSIS — E0590 Thyrotoxicosis, unspecified without thyrotoxic crisis or storm: Secondary | ICD-10-CM | POA: Diagnosis not present

## 2015-10-08 DIAGNOSIS — R296 Repeated falls: Secondary | ICD-10-CM

## 2015-10-08 DIAGNOSIS — Z7982 Long term (current) use of aspirin: Secondary | ICD-10-CM | POA: Insufficient documentation

## 2015-10-08 DIAGNOSIS — J449 Chronic obstructive pulmonary disease, unspecified: Secondary | ICD-10-CM | POA: Diagnosis not present

## 2015-10-08 DIAGNOSIS — Z96642 Presence of left artificial hip joint: Secondary | ICD-10-CM | POA: Diagnosis not present

## 2015-10-08 DIAGNOSIS — Z66 Do not resuscitate: Secondary | ICD-10-CM | POA: Insufficient documentation

## 2015-10-08 DIAGNOSIS — I480 Paroxysmal atrial fibrillation: Secondary | ICD-10-CM | POA: Diagnosis not present

## 2015-10-08 DIAGNOSIS — R42 Dizziness and giddiness: Secondary | ICD-10-CM | POA: Diagnosis not present

## 2015-10-08 LAB — CBC WITH DIFFERENTIAL/PLATELET
Basophils Absolute: 0 10*3/uL (ref 0.0–0.1)
Basophils Relative: 1 %
EOS ABS: 0.2 10*3/uL (ref 0.0–0.7)
Eosinophils Relative: 3 %
HCT: 35.7 % — ABNORMAL LOW (ref 36.0–46.0)
HEMOGLOBIN: 11.2 g/dL — AB (ref 12.0–15.0)
LYMPHS ABS: 1.8 10*3/uL (ref 0.7–4.0)
LYMPHS PCT: 35 %
MCH: 24.7 pg — AB (ref 26.0–34.0)
MCHC: 31.4 g/dL (ref 30.0–36.0)
MCV: 78.6 fL (ref 78.0–100.0)
Monocytes Absolute: 0.7 10*3/uL (ref 0.1–1.0)
Monocytes Relative: 14 %
NEUTROS PCT: 48 %
Neutro Abs: 2.4 10*3/uL (ref 1.7–7.7)
Platelets: 151 10*3/uL (ref 150–400)
RBC: 4.54 MIL/uL (ref 3.87–5.11)
RDW: 14.9 % (ref 11.5–15.5)
WBC: 5.1 10*3/uL (ref 4.0–10.5)

## 2015-10-08 LAB — COMPREHENSIVE METABOLIC PANEL
ALBUMIN: 3.2 g/dL — AB (ref 3.5–5.0)
ALT: 23 U/L (ref 14–54)
AST: 43 U/L — ABNORMAL HIGH (ref 15–41)
Alkaline Phosphatase: 45 U/L (ref 38–126)
Anion gap: 8 (ref 5–15)
BUN: 16 mg/dL (ref 6–20)
CHLORIDE: 106 mmol/L (ref 101–111)
CO2: 25 mmol/L (ref 22–32)
Calcium: 9 mg/dL (ref 8.9–10.3)
Creatinine, Ser: 0.44 mg/dL (ref 0.44–1.00)
Glucose, Bld: 104 mg/dL — ABNORMAL HIGH (ref 65–99)
POTASSIUM: 4.1 mmol/L (ref 3.5–5.1)
SODIUM: 139 mmol/L (ref 135–145)
Total Bilirubin: 0.6 mg/dL (ref 0.3–1.2)
Total Protein: 6.7 g/dL (ref 6.5–8.1)

## 2015-10-08 LAB — URINALYSIS, ROUTINE W REFLEX MICROSCOPIC
BILIRUBIN URINE: NEGATIVE
Glucose, UA: NEGATIVE mg/dL
HGB URINE DIPSTICK: NEGATIVE
KETONES UR: NEGATIVE mg/dL
NITRITE: NEGATIVE
PROTEIN: NEGATIVE mg/dL
SPECIFIC GRAVITY, URINE: 1.013 (ref 1.005–1.030)
pH: 7.5 (ref 5.0–8.0)

## 2015-10-08 LAB — URINE MICROSCOPIC-ADD ON: RBC / HPF: NONE SEEN RBC/hpf (ref 0–5)

## 2015-10-08 LAB — I-STAT TROPONIN, ED: TROPONIN I, POC: 0 ng/mL (ref 0.00–0.08)

## 2015-10-08 MED ORDER — PAROXETINE HCL 20 MG PO TABS
40.0000 mg | ORAL_TABLET | Freq: Every day | ORAL | Status: DC
  Administered 2015-10-09 – 2015-10-10 (×2): 40 mg via ORAL
  Filled 2015-10-08 (×2): qty 2

## 2015-10-08 MED ORDER — CRANBERRY 425 MG PO CAPS: 425.0000 mg | ORAL_CAPSULE | Freq: Two times a day (BID) | ORAL | Status: DC

## 2015-10-08 MED ORDER — VITAMIN D 1000 UNITS PO TABS
2000.0000 [IU] | ORAL_TABLET | Freq: Every day | ORAL | Status: DC
  Administered 2015-10-09 – 2015-10-10 (×2): 2000 [IU] via ORAL
  Filled 2015-10-08 (×2): qty 2

## 2015-10-08 MED ORDER — SUMATRIPTAN SUCCINATE 50 MG PO TABS
50.0000 mg | ORAL_TABLET | Freq: Every day | ORAL | Status: DC | PRN
  Filled 2015-10-08: qty 1

## 2015-10-08 MED ORDER — SACCHAROMYCES BOULARDII 250 MG PO CAPS
250.0000 mg | ORAL_CAPSULE | Freq: Two times a day (BID) | ORAL | Status: DC
  Administered 2015-10-09 – 2015-10-10 (×4): 250 mg via ORAL
  Filled 2015-10-08 (×5): qty 1

## 2015-10-08 MED ORDER — AMIODARONE HCL 200 MG PO TABS
200.0000 mg | ORAL_TABLET | Freq: Every day | ORAL | Status: DC
  Administered 2015-10-09 – 2015-10-10 (×2): 200 mg via ORAL
  Filled 2015-10-08 (×2): qty 1

## 2015-10-08 MED ORDER — AMITRIPTYLINE HCL 25 MG PO TABS
75.0000 mg | ORAL_TABLET | Freq: Every day | ORAL | Status: DC
  Administered 2015-10-09 (×2): 75 mg via ORAL
  Filled 2015-10-08 (×2): qty 3

## 2015-10-08 MED ORDER — ACETAMINOPHEN 500 MG PO TABS
500.0000 mg | ORAL_TABLET | Freq: Once | ORAL | Status: AC
  Administered 2015-10-08: 500 mg via ORAL
  Filled 2015-10-08: qty 1

## 2015-10-08 MED ORDER — FAMOTIDINE 20 MG PO TABS
20.0000 mg | ORAL_TABLET | Freq: Two times a day (BID) | ORAL | Status: DC
  Administered 2015-10-09 (×2): 20 mg via ORAL
  Filled 2015-10-08 (×4): qty 1

## 2015-10-08 MED ORDER — IPRATROPIUM-ALBUTEROL 0.5-2.5 (3) MG/3ML IN SOLN: 3.0000 mL | RESPIRATORY_TRACT | Status: DC | PRN

## 2015-10-08 MED ORDER — BISACODYL 10 MG RE SUPP: 10.0000 mg | Freq: Every day | RECTAL | Status: DC | PRN

## 2015-10-08 MED ORDER — HYDROCHLOROTHIAZIDE 25 MG PO TABS
25.0000 mg | ORAL_TABLET | ORAL | Status: DC
  Administered 2015-10-10: 25 mg via ORAL
  Filled 2015-10-08: qty 1

## 2015-10-08 MED ORDER — MECLIZINE HCL 25 MG PO TABS
12.5000 mg | ORAL_TABLET | Freq: Once | ORAL | Status: AC
  Administered 2015-10-08: 12.5 mg via ORAL
  Filled 2015-10-08: qty 1

## 2015-10-08 MED ORDER — ENOXAPARIN SODIUM 40 MG/0.4ML ~~LOC~~ SOLN
40.0000 mg | SUBCUTANEOUS | Status: DC
  Administered 2015-10-09 – 2015-10-10 (×2): 40 mg via SUBCUTANEOUS
  Filled 2015-10-08 (×3): qty 0.4

## 2015-10-08 MED ORDER — MECLIZINE HCL 25 MG PO TABS: 12.5000 mg | ORAL_TABLET | Freq: Three times a day (TID) | ORAL | Status: DC | PRN

## 2015-10-08 MED ORDER — PROMETHAZINE HCL 25 MG PO TABS: 25.0000 mg | ORAL_TABLET | Freq: Four times a day (QID) | ORAL | Status: DC | PRN

## 2015-10-08 MED ORDER — SENNOSIDES-DOCUSATE SODIUM 8.6-50 MG PO TABS
1.0000 | ORAL_TABLET | Freq: Two times a day (BID) | ORAL | Status: DC
  Filled 2015-10-08 (×3): qty 1

## 2015-10-08 MED ORDER — LEVOTHYROXINE SODIUM 25 MCG PO TABS
25.0000 ug | ORAL_TABLET | Freq: Every day | ORAL | Status: DC
Start: 2015-10-09 — End: 2015-10-09

## 2015-10-08 MED ORDER — ACETAMINOPHEN 325 MG PO TABS: 650.0000 mg | ORAL_TABLET | ORAL | Status: DC | PRN

## 2015-10-08 MED ORDER — ASPIRIN 325 MG PO TABS
325.0000 mg | ORAL_TABLET | Freq: Every day | ORAL | Status: DC
  Administered 2015-10-09 – 2015-10-10 (×2): 325 mg via ORAL
  Filled 2015-10-08 (×2): qty 1

## 2015-10-08 MED ORDER — MIRTAZAPINE 7.5 MG PO TABS
7.5000 mg | ORAL_TABLET | Freq: Every day | ORAL | Status: DC
  Administered 2015-10-09 (×2): 7.5 mg via ORAL
  Filled 2015-10-08 (×3): qty 1

## 2015-10-08 MED ORDER — TRAZODONE HCL 50 MG PO TABS
75.0000 mg | ORAL_TABLET | Freq: Every day | ORAL | Status: DC
  Administered 2015-10-09 (×2): 75 mg via ORAL
  Filled 2015-10-08 (×2): qty 2

## 2015-10-08 MED ORDER — POLYETHYLENE GLYCOL 3350 17 G PO PACK
17.0000 g | PACK | Freq: Every day | ORAL | Status: DC
  Administered 2015-10-09 – 2015-10-10 (×2): 17 g via ORAL
  Filled 2015-10-08 (×2): qty 1

## 2015-10-08 MED ORDER — DILTIAZEM HCL ER COATED BEADS 180 MG PO CP24
180.0000 mg | ORAL_CAPSULE | Freq: Every day | ORAL | Status: DC
  Administered 2015-10-09 – 2015-10-10 (×2): 180 mg via ORAL
  Filled 2015-10-08 (×2): qty 1

## 2015-10-08 NOTE — ED Notes (Signed)
Pt ambulated to the doorway. Pt was very unsteady on her feet. Pt holding on to this tech with both arms and still swaying a little. Pt side stepping the whole time

## 2015-10-08 NOTE — ED Notes (Signed)
Per EMS, patient from [facility], c/o vertigo like symptoms. "feels drunk when she gets up to walk" over the past 2-3 days, asymptomatic when laying. Stroke screen negative. Patient is alert & oriented to her baseline. No dementia per history but patient seems "off" per EMS.

## 2015-10-08 NOTE — ED Notes (Signed)
Patient was dizzy when she stood up during orthostatics.

## 2015-10-08 NOTE — ED Notes (Signed)
Patient states she has a hx of vertigo. Patient states she was having "falls after falls after falls, with broken bones and all". Patient states she was placed into a wheelchair which she still uses for mobility as well as a walker. Patient then begins talking about headaches and migraines for 40 years and her moving into a facility. Patient states this morning when she woke up she wasn't feeling well, she went to breakfast but felt weak, afterwards she came back to her room and went to bed. Patient states she when she woke back up she was dizzy and couldn't get up. Patient states she still feels dizzy now. Patient speaking in clear sentences, however her story is difficult to follow as she does not sequences events well. Patient she has a headache that radiates into her neck. Patient continues to add that this morning her left eye was "swollen about shut" and that "her gums hurt".

## 2015-10-08 NOTE — H&P (Signed)
History and Physical    Paula Bird ZOX:[id number] DOB: 1925/07/06 DOA: 10/08/2015   PCP: [name], [name], MD Chief Complaint:  Chief Complaint  Patient presents with  . Dizziness    HPI: Paula Bird is a 82 y.o. female with medical history significant of frequent falls and complications of those for the past 2 years including admission for SDH in May 2016, PAF not on anticoagulation due to frequently falling.  Patient presents to the ED with c/o acute worsening of her chronic dizziness.  While her balance is poor at baseline it suddenly became worse over the past 2 days to the point where she feels dizzy laying down or getting up.  Has associated headache that is "difficult to describe".  Has nausea, no vomiting.  Nothing makes symptoms better, getting up or laying down makes them worse.  Symptoms are severe.  ED Course: CT head negative, orthostatics negative.  Review of Systems: As per HPI otherwise 10 point review of systems negative.    Past Medical History  Diagnosis Date  . Hypertension   . Thyroid disease   . Vertigo   . Physical deconditioning   . Leukocytosis   . Acute and chronic respiratory failure, unspecified whether with hypoxia or hypercapnia (HCC)   . Protein calorie malnutrition (HCC)   . Oral thrush   . Insomnia   . PAF (paroxysmal atrial fibrillation) (HCC)   . Depression   . COPD (chronic obstructive pulmonary disease) (HCC)   . Dysphagia   . Constipation   . Esophageal dysmotility   . Anemia     Past Surgical History  Procedure Laterality Date  . Abdominal hysterectomy    . Appendectomy    . Total hip arthroplasty Left   . Total knee arthroplasty Left      reports that she has never smoked. She does not have any smokeless tobacco history on file. She reports that she does not drink alcohol or use illicit drugs.  No Known Allergies  Family History  Problem Relation Age of Onset  . Hypertension Mother   . Hypertension Son   . Diabetes  Neg Hx       Prior to Admission medications   Medication Sig Start Date End Date Taking? Authorizing Provider  acetaminophen (TYLENOL) 325 MG tablet Take 650 mg by mouth every 4 (four) hours as needed for mild pain, moderate pain, fever or headache.    Yes Historical Provider, MD  amiodarone (PACERONE) 200 MG tablet Take 200 mg by mouth daily.    Yes Historical Provider, MD  amitriptyline (ELAVIL) 75 MG tablet Take 75 mg by mouth at bedtime. 07/12/15  Yes Historical Provider, MD  aspirin 325 MG tablet Take 325 mg by mouth daily.   Yes Historical Provider, MD  bisacodyl (DULCOLAX) 10 MG suppository Place 10 mg rectally daily as needed for moderate constipation.   Yes Historical Provider, MD  Cholecalciferol (VITAMIN D3) 2000 units TABS Take 1 tablet by mouth daily.   Yes Historical Provider, MD  Cranberry 425 MG CAPS Take 425 mg by mouth 2 (two) times daily.   Yes Historical Provider, MD  diltiazem (CARDIZEM CD) 180 MG 24 hr capsule Take 180 mg by mouth daily. 09/21/15  Yes Historical Provider, MD  famotidine (PEPCID) 20 MG tablet Take 1 tablet (20 mg total) by mouth 2 (two) times daily. 08/16/15  Yes Albertine GratesFang Xu, MD  hydrochlorothiazide (HYDRODIURIL) 25 MG tablet Take 1 tablet (25 mg total) by mouth every Monday, Wednesday, and Friday. 08/16/15  Yes [name], MD  ipratropium-albuterol (DUONEB) 0.5-2.5 (3) MG/3ML SOLN Take 3 mLs by nebulization every 4 (four) hours as needed. Patient taking differently: Take 3 mLs by nebulization every 4 (four) hours as needed (COPD).  08/06/15  Yes Jeralyn Bennett, MD  levothyroxine (SYNTHROID, LEVOTHROID) 25 MCG tablet Take 25 mcg by mouth daily before breakfast.   Yes Historical Provider, MD  mirtazapine (REMERON) 7.5 MG tablet Take 7.5 mg by mouth at bedtime. 07/12/15  Yes Historical Provider, MD  Multiple Vitamins-Minerals (MULTIVITAMIN GUMMIES ADULT PO) Take 1 tablet by mouth daily.   Yes Historical Provider, MD  PARoxetine (PAXIL) 40 MG tablet Take 40 mg by mouth  daily. 07/23/15  Yes Historical Provider, MD  polyethylene glycol (MIRALAX / GLYCOLAX) packet Take 17 g by mouth daily. 08/27/14  Yes Arthor Captain, PA-C  promethazine (PHENERGAN) 25 MG tablet Take 25 mg by mouth every 6 (six) hours as needed for nausea or vomiting.   Yes Historical Provider, MD  Protein (PROCEL) POWD Take 2 scoop by mouth 2 (two) times daily.   Yes Historical Provider, MD  saccharomyces boulardii (FLORASTOR) 250 MG capsule Take 250 mg by mouth 2 (two) times daily.   Yes Historical Provider, MD  sennosides-docusate sodium (SENOKOT-S) 8.6-50 MG tablet Take 1 tablet by mouth 2 (two) times daily. 08/16/15  Yes [name], MD  SUMAtriptan (IMITREX) 25 MG tablet Take 50 mg by mouth daily as needed for migraine. May repeat in 2 hours if headache persists or recurs.   Yes Historical Provider, MD  traZODone (DESYREL) 150 MG tablet Take 75 mg by mouth at bedtime. Take 1/2 of a 150 mg tablet to - 75 mg po QHS   Yes Historical Provider, MD    Physical Exam: Filed Vitals:   10/08/15 1830 10/08/15 1930 10/08/15 2000 10/08/15 2103  BP: 105/91 141/63 142/65 161/78  Pulse: 74 72 73 87  Temp:    98.3 F (36.8 C)  TempSrc:    Oral  Resp: 26 23 20 18   SpO2: 98% 95% 97% 93%      Constitutional: NAD, calm, comfortable Eyes: PERRL, lids and conjunctivae normal ENMT: Mucous membranes are moist. Posterior pharynx clear of any exudate or lesions.Normal dentition.  Neck: normal, supple, no masses, no thyromegaly Respiratory: clear to auscultation bilaterally, no wheezing, no crackles. Normal respiratory effort. No accessory muscle use.  Cardiovascular: Regular rate and rhythm, no murmurs / rubs / gallops. No extremity edema. 2+ pedal pulses. No carotid bruits.  Abdomen: no tenderness, no masses palpated. No hepatosplenomegaly. Bowel sounds positive.  Musculoskeletal: no clubbing / cyanosis. No joint deformity upper and lower extremities. Good ROM, no contractures. Normal muscle tone.  Skin: no  rashes, lesions, ulcers. No induration Neurologic: CN 2-12 grossly intact. Sensation intact, DTR normal. Strength 5/5 in all 4. Patient's finger to nose testing is abnormal, especially on the R side of her body.  Gait is ataxic. Psychiatric: Normal judgment and insight. Alert and oriented x 3. Normal mood.    Labs on Admission: I have personally reviewed following labs and imaging studies  CBC:  Recent Labs Lab 10/08/15 1947  WBC 5.1  NEUTROABS 2.4  HGB 11.2*  HCT 35.7*  MCV 78.6  PLT 151   Basic Metabolic Panel:  Recent Labs Lab 10/08/15 1947  NA 139  K 4.1  CL 106  CO2 25  GLUCOSE 104*  BUN 16  CREATININE 0.44  CALCIUM 9.0   GFR: CrCl cannot be calculated (Unknown ideal weight.). Liver Function Tests:  Recent Labs Lab 10/08/15 1947  AST 43*  ALT 23  ALKPHOS 45  BILITOT 0.6  PROT 6.7  ALBUMIN 3.2*   No results for input(s): LIPASE, AMYLASE in the last 168 hours. No results for input(s): AMMONIA in the last 168 hours. Coagulation Profile: No results for input(s): INR, PROTIME in the last 168 hours. Cardiac Enzymes: No results for input(s): CKTOTAL, CKMB, CKMBINDEX, TROPONINI in the last 168 hours. BNP (last 3 results) No results for input(s): PROBNP in the last 8760 hours. HbA1C: No results for input(s): HGBA1C in the last 72 hours. CBG: No results for input(s): GLUCAP in the last 168 hours. Lipid Profile: No results for input(s): CHOL, HDL, LDLCALC, TRIG, CHOLHDL, LDLDIRECT in the last 72 hours. Thyroid Function Tests: No results for input(s): TSH, T4TOTAL, FREET4, T3FREE, THYROIDAB in the last 72 hours. Anemia Panel: No results for input(s): VITAMINB12, FOLATE, FERRITIN, TIBC, IRON, RETICCTPCT in the last 72 hours. Urine analysis:    Component Value Date/Time   COLORURINE YELLOW 10/08/2015 2102   APPEARANCEUR CLOUDY* 10/08/2015 2102   LABSPEC 1.013 10/08/2015 2102   PHURINE 7.5 10/08/2015 2102   GLUCOSEU NEGATIVE 10/08/2015 2102   HGBUR  NEGATIVE 10/08/2015 2102   BILIRUBINUR NEGATIVE 10/08/2015 2102   KETONESUR NEGATIVE 10/08/2015 2102   PROTEINUR NEGATIVE 10/08/2015 2102   UROBILINOGEN 0.2 08/13/2014 2256   NITRITE NEGATIVE 10/08/2015 2102   LEUKOCYTESUR SMALL* 10/08/2015 2102   Sepsis Labs: (procalcitonin:4,lacticidven:4) )No results found for this or any previous visit (from the past 240 hour(s)).   Radiological Exams on Admission: Ct Head Wo Contrast  10/08/2015  CLINICAL DATA:  82 year old female recurrent falls and headaches. EXAM: CT HEAD WITHOUT CONTRAST TECHNIQUE: Contiguous axial images were obtained from the base of the skull through the vertex without intravenous contrast. COMPARISON:  08/27/2014 and prior exams FINDINGS: Atrophy and chronic small-vessel white matter ischemic changes are noted. An unchanged 5 mm right frontal parafalcine meningioma is now better visualized on the axial and sagittal images. No acute intracranial abnormalities are identified, including mass effect, hydrocephalus, extra-axial fluid collection, midline shift, hemorrhage, or acute infarction. The visualized bony calvarium is unremarkable. IMPRESSION: No evidence of acute intracranial abnormality. Atrophy, chronic small-vessel white matter ischemic changes and stable 5 mm right frontal parafalcine meningioma. Electronically Signed   By: Harmon Pier M.D.   On: 10/08/2015 20:56    EKG: Independently reviewed.  Assessment/Plan Principal Problem:   Excessive falling Active Problems:   Hypertension   Hypothyroidism   Vertigo    Excessive falling with vertigo -  PT/OT eval and treat  MRI brain ordered to r/o a cerebellar stroke which is a possibility in this patient with h/o PAF not on anticoagulation due to h/o excessive falling in past.  CT head is negative for acute hemorrhage.  If above is positive then will need neuro consult and full inpatient stroke work up of course.  Otherwise will try and continue meclizine PRN  and see what PT/OT have to offer.  HTN - continue home meds  Hypothyroidism -  Got synthroid dose reduced in May due to being hyperthyroid at that time  Rechecking TSH this visit   DVT prophylaxis: Lovenox Code Status: Full Family Communication: No family in room Consults called: None Admission status: Admit to obs   Hillary Bow DO [facility] Pager [phone] from 7PM-7AM  If 7AM-7PM, please contact the day physician for the patient [email or web address] Password [email or web address]  10/08/2015, 11:14 PM

## 2015-10-08 NOTE — ED Provider Notes (Signed)
CSN: [id number]     Arrival date & time 10/08/15  1807 History   First MD Initiated Contact with Patient 10/08/15 1913     Chief Complaint  Patient presents with  . Dizziness     Patient is a 82 y.o. female presenting with dizziness. The history is provided by the patient. No language interpreter was used.  Dizziness  Lindsy Cerullo is a 82 y.o. female who presents to the Emergency Department complaining of dizziness/headache.  She presents from assisted living for dizziness and headache. She reports 2 days of feeling dizzy when she gets up. She has associated headache that is difficult to describe. She has nausea but no vomiting. Denies fever, cough, chest pain, abdominal pain, leg swelling or pain, numbness, weakness. She's had similar symptoms multiple times in the past. She currently denies any dizziness but has a mild headache.  Past Medical History  Diagnosis Date  . Hypertension   . Thyroid disease   . Vertigo   . Physical deconditioning   . Leukocytosis   . Acute and chronic respiratory failure, unspecified whether with hypoxia or hypercapnia (HCC)   . Protein calorie malnutrition (HCC)   . Oral thrush   . Insomnia   . PAF (paroxysmal atrial fibrillation) (HCC)   . Depression   . COPD (chronic obstructive pulmonary disease) (HCC)   . Dysphagia   . Constipation   . Esophageal dysmotility   . Anemia    Past Surgical History  Procedure Laterality Date  . Abdominal hysterectomy    . Appendectomy    . Total hip arthroplasty Left   . Total knee arthroplasty Left    Family History  Problem Relation Age of Onset  . Hypertension Mother   . Hypertension Son   . Diabetes Neg Hx    Social History  Substance Use Topics  . Smoking status: Never Smoker   . Smokeless tobacco: None  . Alcohol Use: No   OB History    No data available     Review of Systems  Neurological: Positive for dizziness.  All other systems reviewed and are negative.     Allergies  Review  of patient's allergies indicates no known allergies.  Home Medications   Prior to Admission medications   Medication Sig Start Date End Date Taking? Authorizing Provider  acetaminophen (TYLENOL) 325 MG tablet Take 650 mg by mouth every 4 (four) hours as needed for mild pain, moderate pain, fever or headache.    Yes Historical Provider, MD  amiodarone (PACERONE) 200 MG tablet Take 200 mg by mouth daily.    Yes Historical Provider, MD  amitriptyline (ELAVIL) 75 MG tablet Take 75 mg by mouth at bedtime. 07/12/15  Yes Historical Provider, MD  aspirin 325 MG tablet Take 325 mg by mouth daily.   Yes Historical Provider, MD  bisacodyl (DULCOLAX) 10 MG suppository Place 10 mg rectally daily as needed for moderate constipation.   Yes Historical Provider, MD  Cholecalciferol (VITAMIN D3) 2000 units TABS Take 1 tablet by mouth daily.   Yes Historical Provider, MD  Cranberry 425 MG CAPS Take 425 mg by mouth 2 (two) times daily.   Yes Historical Provider, MD  diltiazem (CARDIZEM CD) 180 MG 24 hr capsule Take 180 mg by mouth daily. 09/21/15  Yes Historical Provider, MD  famotidine (PEPCID) 20 MG tablet Take 1 tablet (20 mg total) by mouth 2 (two) times daily. 08/16/15  Yes [name], MD  hydrochlorothiazide (HYDRODIURIL) 25 MG tablet Take 1 tablet (25  mg total) by mouth every Monday, Wednesday, and Friday. 08/16/15  Yes Albertine GratesFang Xu, MD  ipratropium-albuterol (DUONEB) 0.5-2.5 (3) MG/3ML SOLN Take 3 mLs by nebulization every 4 (four) hours as needed. Patient taking differently: Take 3 mLs by nebulization every 4 (four) hours as needed (COPD).  08/06/15  Yes Jeralyn BennettEzequiel Zamora, MD  levothyroxine (SYNTHROID, LEVOTHROID) 25 MCG tablet Take 25 mcg by mouth daily before breakfast.   Yes Historical Provider, MD  mirtazapine (REMERON) 7.5 MG tablet Take 7.5 mg by mouth at bedtime. 07/12/15  Yes Historical Provider, MD  Multiple Vitamins-Minerals (MULTIVITAMIN GUMMIES ADULT PO) Take 1 tablet by mouth daily.   Yes Historical Provider,  MD  PARoxetine (PAXIL) 40 MG tablet Take 40 mg by mouth daily. 07/23/15  Yes Historical Provider, MD  polyethylene glycol (MIRALAX / GLYCOLAX) packet Take 17 g by mouth daily. 08/27/14  Yes Arthor CaptainAbigail Harris, PA-C  promethazine (PHENERGAN) 25 MG tablet Take 25 mg by mouth every 6 (six) hours as needed for nausea or vomiting.   Yes Historical Provider, MD  Protein (PROCEL) POWD Take 2 scoop by mouth 2 (two) times daily.   Yes Historical Provider, MD  saccharomyces boulardii (FLORASTOR) 250 MG capsule Take 250 mg by mouth 2 (two) times daily.   Yes Historical Provider, MD  sennosides-docusate sodium (SENOKOT-S) 8.6-50 MG tablet Take 1 tablet by mouth 2 (two) times daily. 08/16/15  Yes Albertine GratesFang Xu, MD  SUMAtriptan (IMITREX) 25 MG tablet Take 50 mg by mouth daily as needed for migraine. May repeat in 2 hours if headache persists or recurs.   Yes Historical Provider, MD  traZODone (DESYREL) 150 MG tablet Take 75 mg by mouth at bedtime. Take 1/2 of a 150 mg tablet to - 75 mg po QHS   Yes Historical Provider, MD   BP 162/70 mmHg  Pulse 74  Temp(Src) 98.1 F (36.7 C) (Oral)  Resp 21  Ht 5\' 2"  (1.575 m)  Wt 141 lb 12.8 oz (64.32 kg)  BMI 25.93 kg/m2  SpO2 96% Physical Exam  Constitutional: She is oriented to person, place, and time. She appears well-developed and well-nourished.  HENT:  Head: Normocephalic and atraumatic.  Right Ear: External ear normal.  Left Ear: External ear normal.  Mouth/Throat: Oropharynx is clear and moist. No oropharyngeal exudate.  Neck: Neck supple.  Cardiovascular: Normal rate and regular rhythm.   No murmur heard. Pulmonary/Chest: Effort normal and breath sounds normal. No respiratory distress.  Abdominal: Soft. There is no tenderness. There is no rebound and no guarding.  Musculoskeletal: She exhibits no edema or tenderness.  Neurological: She is alert and oriented to person, place, and time.  No facial asymmetry.  5/5 strength in all four extremities.  Ataxic gait  Skin:  Skin is warm and dry.  Psychiatric: She has a normal mood and affect. Her behavior is normal.  Nursing note and vitals reviewed.   ED Course  Procedures (including critical care time) Labs Review Labs Reviewed  COMPREHENSIVE METABOLIC PANEL - Abnormal; Notable for the following:    Glucose, Bld 104 (*)    Albumin 3.2 (*)    AST 43 (*)    All other components within normal limits  CBC WITH DIFFERENTIAL/PLATELET - Abnormal; Notable for the following:    Hemoglobin 11.2 (*)    HCT 35.7 (*)    MCH 24.7 (*)    All other components within normal limits  URINALYSIS, ROUTINE W REFLEX MICROSCOPIC (NOT [facility]) - Abnormal; Notable for the following:    APPearance  CLOUDY (*)    Leukocytes, UA SMALL (*)    All other components within normal limits  URINE MICROSCOPIC-ADD ON - Abnormal; Notable for the following:    Squamous Epithelial / LPF 0-5 (*)    Bacteria, UA RARE (*)    All other components within normal limits  TSH  I-STAT TROPOININ, ED    Imaging Review Dg Chest 2 View  10/08/2015  CLINICAL DATA:  82 year old female with cough and dizziness EXAM: CHEST  2 VIEW COMPARISON:  Chest CT dated 08/14/2015 FINDINGS: Two views of the chest demonstrate oral mild emphysematous changes of the lungs. There is no focal consolidation, pleural effusion, or pneumothorax. Right paratracheal density corresponds to the brachiocephalic trunk. The cardiac silhouette is within normal limits. There is osteopenia with degenerative changes of the spine. Stable appearing compression deformity of the mid thoracic vertebra. Lower thoracic vertebroplasty. No acute osseous pathology. IMPRESSION: No active cardiopulmonary disease. Electronically Signed   By: Elgie Collard M.D.   On: 10/08/2015 23:27   Ct Head Wo Contrast  10/08/2015  CLINICAL DATA:  82 year old female recurrent falls and headaches. EXAM: CT HEAD WITHOUT CONTRAST TECHNIQUE: Contiguous axial images were obtained from the base of the skull through  the vertex without intravenous contrast. COMPARISON:  08/27/2014 and prior exams FINDINGS: Atrophy and chronic small-vessel white matter ischemic changes are noted. An unchanged 5 mm right frontal parafalcine meningioma is now better visualized on the axial and sagittal images. No acute intracranial abnormalities are identified, including mass effect, hydrocephalus, extra-axial fluid collection, midline shift, hemorrhage, or acute infarction. The visualized bony calvarium is unremarkable. IMPRESSION: No evidence of acute intracranial abnormality. Atrophy, chronic small-vessel white matter ischemic changes and stable 5 mm right frontal parafalcine meningioma. Electronically Signed   By: Harmon Pier M.D.   On: 10/08/2015 20:56   I have personally reviewed and evaluated these images and lab results as part of my medical decision-making.   EKG Interpretation   Date/Time:  Saturday October 08 2015 19:48:30 EDT Ventricular Rate:  73 PR Interval:    QRS Duration: 87 QT Interval:  399 QTC Calculation: 440 R Axis:   -15 Text Interpretation:  Sinus or ectopic atrial rhythm Prolonged PR interval  Borderline left axis deviation Consider anterior infarct Minimal ST  depression, lateral leads Confirmed by Lincoln Brigham [phone]) on 10/08/2015  7:51:41 PM      MDM   Final diagnoses:  Vertigo    Pt here with dizziness, significant ataxia on walking in the department.  No evidence of acute infection.  Plan to admit for further evaluation and treatment of ataxia.      Tilden Fossa, MD 10/09/15 0100

## 2015-10-09 ENCOUNTER — Encounter (HOSPITAL_COMMUNITY): Payer: Self-pay | Admitting: *Deleted

## 2015-10-09 ENCOUNTER — Observation Stay (HOSPITAL_COMMUNITY): Payer: Medicare Other

## 2015-10-09 DIAGNOSIS — R296 Repeated falls: Secondary | ICD-10-CM | POA: Diagnosis not present

## 2015-10-09 LAB — TSH: TSH: <0.01 u[IU]/mL — ABNORMAL LOW (ref 0.350–4.500)

## 2015-10-09 LAB — MRSA PCR SCREENING: MRSA BY PCR: POSITIVE — AB

## 2015-10-09 MED ORDER — CHLORHEXIDINE GLUCONATE CLOTH 2 % EX PADS
6.0000 | MEDICATED_PAD | Freq: Every day | CUTANEOUS | Status: DC
  Administered 2015-10-09 – 2015-10-10 (×2): 6 via TOPICAL

## 2015-10-09 MED ORDER — MUPIROCIN 2 % EX OINT
1.0000 "application " | TOPICAL_OINTMENT | Freq: Two times a day (BID) | CUTANEOUS | Status: DC
  Administered 2015-10-09 – 2015-10-10 (×4): 1 via NASAL
  Filled 2015-10-09 (×2): qty 22

## 2015-10-09 NOTE — Evaluation (Addendum)
Physical Therapy Evaluation Patient Details Name: Paula Bird MRN: [id number] DOB: 1925-09-22 Today's Date: 10/09/2015   History of Present Illness  82 yo female admitted with excessive falling. Hx of vertigo, HTN, PAF, COPD.   Clinical Impression  On eval, pt required Min assist +2 for mobility. She was able to walk ~30 feet but not without significant difficulty. Gait is moderately ataxic. Pt c/o dizziness at rest and during activity. At this time, recommend ST rehab at [facility] Will follow and continue to assess.     Follow Up Recommendations SNF    Equipment Recommendations  None recommended by PT    Recommendations for Other Services OT consult     Precautions / Restrictions Precautions Precautions: Fall Precaution Comments: chronic fall risk Restrictions Weight Bearing Restrictions: No      Mobility  Bed Mobility Overal bed mobility: Needs Assistance Bed Mobility: Supine to Sit     Supine to sit: Min guard     General bed mobility comments: close guard for safety. Increased time.   Transfers Overall transfer level: Needs assistance Equipment used: Rolling walker (2 wheeled);None Transfers: Sit to/from [name]: Min assist Stand pivot transfers: Min assist       General transfer comment: Assist to rise, stabilize, control descent. Stand pivot, bed to bsc, with pt using armrests for support.  Ambulation/Gait Ambulation/Gait assistance: Min assist;+2 physical assistance;+2 safety/equipment Ambulation Distance (Feet): 35 Feet Assistive device: Rolling walker (2 wheeled) Gait Pattern/deviations: Ataxic;Trunk flexed;Decreased stride length     General Gait Details: Assist needed to stabilize pt and maneuver safely with RW. Gait is moderately ataxic. Followed closely with recliner.   Stairs            Wheelchair Mobility    Modified Rankin (Stroke Patients Only)       Balance Overall balance assessment: Needs  assistance;History of Falls         Standing balance support: During functional activity Standing balance-Leahy Scale: Poor                               Pertinent Vitals/Pain Pain Assessment: No/denies pain    Home Living Family/patient expects to be discharged to:: Skilled nursing facility Living Arrangements: Alone   Type of Home: House         Home Equipment: Wheelchair - [employer] - 4 wheels      Prior Function Level of Independence: Needs assistance   Gait / Transfers Assistance Needed: uses wheelchair primarily. Pt does a little walking with rollator and assistance           Hand Dominance        Extremity/Trunk Assessment   Upper Extremity Assessment: Generalized weakness           Lower Extremity Assessment: Generalized weakness      Cervical / Trunk Assessment: Kyphotic  Communication   Communication: No difficulties  Cognition Arousal/Alertness: Awake/alert Behavior During Therapy: WFL for tasks assessed/performed Overall Cognitive Status: Within Functional Limits for tasks assessed                      General Comments      Exercises        Assessment/Plan    PT Assessment Patient needs continued PT services  PT Diagnosis Difficulty walking;Abnormality of gait;Generalized weakness   PT Problem List Decreased strength;Decreased activity tolerance;Decreased balance;Decreased mobility;Decreased knowledge of use of DME;Decreased coordination  PT  Treatment Interventions DME instruction;Gait training;Functional mobility training;Therapeutic activities;Patient/family education;Balance training   PT Goals (Current goals can be found in the Care Plan section) Acute Rehab PT Goals Patient Stated Goal: to find out what's causing the dizziness PT Goal Formulation: With patient Time For Goal Achievement: 10/23/15 Potential to Achieve Goals: Good    Frequency Min 3X/week   Barriers to discharge         Co-evaluation               End of Session Equipment Utilized During Treatment: Gait belt Activity Tolerance: Patient tolerated treatment well Patient left: in chair;with call bell/phone within reach;with chair alarm set      Functional Assessment Tool Used: clinical judgement Functional Limitation: Mobility: Walking and moving around Mobility: Walking and Moving Around Current Status [phone](G8978): At least 20 percent but less than 40 percent impaired, limited or restricted Mobility: Walking and Moving Around Goal Status [phone](G8979): At least 1 percent but less than 20 percent impaired, limited or restricted    Time: 1050-1109 PT Time Calculation (min) (ACUTE ONLY): 19 min   Charges:   PT Evaluation $PT Eval Low Complexity: 1 Procedure     PT G Codes:   PT G-Codes **NOT FOR INPATIENT CLASS** Functional Assessment Tool Used: clinical judgement Functional Limitation: Mobility: Walking and moving around Mobility: Walking and Moving Around Current Status ([phone]): At least 20 percent but less than 40 percent impaired, limited or restricted Mobility: Walking and Moving Around Goal Status [phone](G8979): At least 1 percent but less than 20 percent impaired, limited or restricted    Rebeca AlertJannie Kimyata Milich, MPT Pager: [phone]

## 2015-10-09 NOTE — Care Management Obs Status (Signed)
MEDICARE OBSERVATION STATUS NOTIFICATION   Patient Details  Name: Paula Bird MRN: [id number] Date of Birth: February 15, 1926   Medicare Observation Status Notification Given:  Yes    Antony HasteBennett, Magdeline Prange Harris, RN 10/09/2015, 2:14 PM

## 2015-10-09 NOTE — Progress Notes (Signed)
PHARMACIST - PHYSICIAN ORDER COMMUNICATION  CONCERNING: P&T Medication Policy on Herbal Medications  DESCRIPTION:  This patient's order for:  cranberry  has been noted.  This product(s) is classified as an "herbal" or natural product. Due to a lack of definitive safety studies or FDA approval, nonstandard manufacturing practices, plus the potential risk of unknown drug-drug interactions while on inpatient medications, the Pharmacy and Therapeutics Committee does not permit the use of "herbal" or natural products of this type within [facility].   ACTION TAKEN: The pharmacy department is unable to verify this order at this time and your patient has been informed of this safety policy. Please reevaluate patient's clinical condition at discharge and address if the herbal or natural product(s) should be resumed at that time.  Thanks Lorenza EvangelistGreen, Journe Hallmark R 10/09/2015 12:43 AM

## 2015-10-09 NOTE — Progress Notes (Signed)
PROGRESS NOTE  Paula Bird JXB:[id number] DOB: 1925/11/24 DOA: 10/08/2015 PCP: [name], MD     Brief Narrative: 82 y.o. female with medical history significant of frequent falls and complications of those for the past 2 years including admission for SDH in May 2016, PAF not on anticoagulation due to frequently falling. Patient presents to the ED with c/o acute worsening of her chronic dizziness. While her balance is poor at baseline it suddenly became worse over the past 2 days to the point where she feels dizzy laying down or getting up. Has associated headache that is "difficult to describe". Has nausea, no vomiting. Nothing makes symptoms better, getting up or laying down makes them worse. Symptoms are severe.  Assessment & Plan: Principal Problem:   Excessive falling Active Problems:   Hypertension   Hypothyroidism   Vertigo  Excessive falling with vertigo  - MRI negative for CVA - PT to see - likely due to symptomatic hyperthyroidism   Hyperthyroidism  - diagnosed with hypothyroidism in the past, has been on synthroid it appears for several years and at one point as high as 175 mcg. This has been managed at [facility] and I have limited access to that information, but it appears that the highest TSH was ~14 few years back. Following treatment her dose has been decreased several times for persistently low TSH, and now she is on 25 mcg and TSH is < 0.01. She will need to stop synthroid altogether at this point and have her TSH checked in 2-3 weeks as an outpatient. She is quite fidgety and tremulous in the room. Her constellation of symptoms are likely caused by this  HTN  - continue home meds  PAF - CHADSVASC score at least 4, no A/C due to multiple falls  - continue amiodarone  COPD - no wheezing, nebs PRN   DVT prophylaxis: Lovenox Code Status: DNR Family Communication: no family bedside Disposition Plan: ALF vs SNF 1 day  Consultants:   None    Procedures:   None   Antimicrobials:  None    Subjective: - ongoing dizziness and generally not feeling well. Very fidgety in the room   Objective: Filed Vitals:   10/08/15 2359 10/09/15 0045 10/09/15 0547 10/09/15 1052  BP: 162/83 162/70 142/65 123/72  Pulse: 76 74 75   Temp: 98.3 F (36.8 C) 98.1 F (36.7 C) 98.4 F (36.9 C)   TempSrc: Oral Oral Oral   Resp: 18 21 20    Height:  5\' 2"  (1.575 m)    Weight:  64.32 kg (141 lb 12.8 oz)    SpO2: 98% 96% 98%     Intake/Output Summary (Last 24 hours) at 10/09/15 1129 Last data filed at 10/09/15 0900  Gross per 24 hour  Intake    480 ml  Output      0 ml  Net    480 ml   Filed Weights   10/09/15 0045  Weight: 64.32 kg (141 lb 12.8 oz)    Examination: Constitutional: NAD Filed Vitals:   10/08/15 2359 10/09/15 0045 10/09/15 0547 10/09/15 1052  BP: 162/83 162/70 142/65 123/72  Pulse: 76 74 75   Temp: 98.3 F (36.8 C) 98.1 F (36.7 C) 98.4 F (36.9 C)   TempSrc: Oral Oral Oral   Resp: 18 21 20    Height:  5\' 2"  (1.575 m)    Weight:  64.32 kg (141 lb 12.8 oz)    SpO2: 98% 96% 98%    Eyes: PERRL, lids and  conjunctivae normal ENMT: Mucous membranes are moist. Respiratory: clear to auscultation bilaterally, no wheezing, no crackles. Normal respiratory effort. No accessory muscle use.  Cardiovascular: Regular rate and rhythm, no murmurs / rubs / gallops. No LE edema. 2+ pedal pulses. No carotid bruits.  Abdomen: no tenderness. Bowel sounds positive.  Musculoskeletal: no clubbing / cyanosis. No joint deformity upper and lower extremities. No contractures. Decreased muscle tone.  Neurologic: non focal    Data Reviewed: I have personally reviewed following labs and imaging studies  CBC:  Recent Labs Lab 10/08/15 1947  WBC 5.1  NEUTROABS 2.4  HGB 11.2*  HCT 35.7*  MCV 78.6  PLT 151   Basic Metabolic Panel:  Recent Labs Lab 10/08/15 1947  NA 139  K 4.1  CL 106  CO2 25  GLUCOSE 104*  BUN 16   CREATININE 0.44  CALCIUM 9.0   GFR: Estimated Creatinine Clearance: 42.8 mL/min (by C-G formula based on Cr of 0.44). Liver Function Tests:  Recent Labs Lab 10/08/15 1947  AST 43*  ALT 23  ALKPHOS 45  BILITOT 0.6  PROT 6.7  ALBUMIN 3.2*   No results for input(s): LIPASE, AMYLASE in the last 168 hours. No results for input(s): AMMONIA in the last 168 hours. Coagulation Profile: No results for input(s): INR, PROTIME in the last 168 hours. Cardiac Enzymes: No results for input(s): CKTOTAL, CKMB, CKMBINDEX, TROPONINI in the last 168 hours. BNP (last 3 results) No results for input(s): PROBNP in the last 8760 hours. HbA1C: No results for input(s): HGBA1C in the last 72 hours. CBG: No results for input(s): GLUCAP in the last 168 hours. Lipid Profile: No results for input(s): CHOL, HDL, LDLCALC, TRIG, CHOLHDL, LDLDIRECT in the last 72 hours. Thyroid Function Tests:  Recent Labs  10/09/15 0006  TSH <0.010*   Anemia Panel: No results for input(s): VITAMINB12, FOLATE, FERRITIN, TIBC, IRON, RETICCTPCT in the last 72 hours. Urine analysis:    Component Value Date/Time   COLORURINE YELLOW 10/08/2015 2102   APPEARANCEUR CLOUDY* 10/08/2015 2102   LABSPEC 1.013 10/08/2015 2102   PHURINE 7.5 10/08/2015 2102   GLUCOSEU NEGATIVE 10/08/2015 2102   HGBUR NEGATIVE 10/08/2015 2102   BILIRUBINUR NEGATIVE 10/08/2015 2102   KETONESUR NEGATIVE 10/08/2015 2102   PROTEINUR NEGATIVE 10/08/2015 2102   UROBILINOGEN 0.2 08/13/2014 2256   NITRITE NEGATIVE 10/08/2015 2102   LEUKOCYTESUR SMALL* 10/08/2015 2102   Sepsis Labs: Invalid input(s): PROCALCITONIN, LACTICIDVEN  Recent Results (from the past 240 hour(s))  MRSA PCR Screening     Status: Abnormal   Collection Time: 10/09/15  4:35 AM  Result Value Ref Range Status   MRSA by PCR POSITIVE (A) NEGATIVE Final    Comment:        The GeneXpert MRSA Assay (FDA approved for NASAL specimens only), is one component of a comprehensive  MRSA colonization surveillance program. It is not intended to diagnose MRSA infection nor to guide or monitor treatment for MRSA infections. RESULT CALLED TO, READ BACK BY AND VERIFIED WITH: D WILLIAMSJONES AT 0920 ON 07.16.2017 BYNBROOKS       Radiology Studies: Dg Chest 2 View  10/08/2015  CLINICAL DATA:  82 year old female with cough and dizziness EXAM: CHEST  2 VIEW COMPARISON:  Chest CT dated 08/14/2015 FINDINGS: Two views of the chest demonstrate oral mild emphysematous changes of the lungs. There is no focal consolidation, pleural effusion, or pneumothorax. Right paratracheal density corresponds to the brachiocephalic trunk. The cardiac silhouette is within normal limits. There is osteopenia with degenerative changes  of the spine. Stable appearing compression deformity of the mid thoracic vertebra. Lower thoracic vertebroplasty. No acute osseous pathology. IMPRESSION: No active cardiopulmonary disease. Electronically Signed   By: Elgie CollardArash  Radparvar M.D.   On: 10/08/2015 23:27   Ct Head Wo Contrast  10/08/2015  CLINICAL DATA:  82 year old female recurrent falls and headaches. EXAM: CT HEAD WITHOUT CONTRAST TECHNIQUE: Contiguous axial images were obtained from the base of the skull through the vertex without intravenous contrast. COMPARISON:  08/27/2014 and prior exams FINDINGS: Atrophy and chronic small-vessel white matter ischemic changes are noted. An unchanged 5 mm right frontal parafalcine meningioma is now better visualized on the axial and sagittal images. No acute intracranial abnormalities are identified, including mass effect, hydrocephalus, extra-axial fluid collection, midline shift, hemorrhage, or acute infarction. The visualized bony calvarium is unremarkable. IMPRESSION: No evidence of acute intracranial abnormality. Atrophy, chronic small-vessel white matter ischemic changes and stable 5 mm right frontal parafalcine meningioma. Electronically Signed   By: Harmon PierJeffrey  Hu M.D.   On:  10/08/2015 20:56   Mr Brain Wo Contrast  10/09/2015  CLINICAL DATA:  Vertigo. Acute worsening of chronic dizziness beginning 2 days ago. Frequent falls. EXAM: MRI HEAD WITHOUT CONTRAST TECHNIQUE: Multiplanar, multiecho pulse sequences of the brain and surrounding structures were obtained without intravenous contrast. COMPARISON:  CT head without contrast 10/08/2015. MRI brain 08/20/2014. FINDINGS: The diffusion-weighted images demonstrate no acute or subacute infarction. Moderate periventricular and diffuse subcortical T2 changes bilaterally are not significantly changed. Previously noted subdural collection on the right has resolved. A frontal parafalcine meningioma is stable. No acute infarct, hemorrhage, or new mass lesion is present. The internal auditory canals are within normal limits bilaterally. Flow is present in the major intracranial arteries. Bilateral lens replacements are present. Globes and orbits are otherwise intact. The paranasal sinuses are clear. There is fluid in the mastoid air cells bilaterally. No obstructing nasopharyngeal lesion is present. The skullbase is within normal limits. Midline sagittal images are unremarkable. IMPRESSION: 1. No acute intracranial abnormality or significant interval change. 2. Stable moderate periventricular and diffuse subcortical white matter disease bilaterally. This likely reflects the sequela of chronic microvascular ischemia. 3. Resolution of right subdural hematoma seen 1 year ago. Electronically Signed   By: Marin Robertshristopher  Mattern M.D.   On: 10/09/2015 09:31     Scheduled Meds: . amiodarone  200 mg Oral Daily  . amitriptyline  75 mg Oral QHS  . aspirin  325 mg Oral Daily  . Chlorhexidine Gluconate Cloth  6 each Topical Q0600  . cholecalciferol  2,000 Units Oral Daily  . diltiazem  180 mg Oral Daily  . enoxaparin (LOVENOX) injection  40 mg Subcutaneous Q24H  . famotidine  20 mg Oral BID  . [START ON 10/10/2015] hydrochlorothiazide  25 mg Oral Q  M,W,F  . mirtazapine  7.5 mg Oral QHS  . mupirocin ointment  1 application Nasal BID  . PARoxetine  40 mg Oral Daily  . polyethylene glycol  17 g Oral Daily  . saccharomyces boulardii  250 mg Oral BID  . senna-docusate  1 tablet Oral BID  . traZODone  75 mg Oral QHS   Continuous Infusions:     Pamella Pertostin Gherghe, MD, PhD [facility] Pager [phone]  If 7PM-7AM, please contact night-coverage [email or web address] Password [email or web address] 10/09/2015, 11:29 AM

## 2015-10-09 NOTE — ED Notes (Signed)
Report given to nurse on the 5E floor.

## 2015-10-10 DIAGNOSIS — R296 Repeated falls: Secondary | ICD-10-CM | POA: Diagnosis not present

## 2015-10-10 NOTE — Progress Notes (Signed)
[facility].

## 2015-10-10 NOTE — Progress Notes (Signed)
Patient is returning to ALF with community managed PT and PT offered at facility. Patient and son felt it is better for her to continue services there.  LCSWA [facility] & Attending Nurse, clinicals and DC summary sent. [facility] called for transport.  Med. Scripts. DNR. Placed. No other needs at this time.   Vivi BarrackNicole Bannie Lobban, Theresia MajorsLCSWA, MSW Clinical Social Worker 5E and Psychiatric Service Line [phone] 10/10/2015  5:13 PM

## 2015-10-10 NOTE — NC FL2 (Addendum)
[place] MEDICAID FL2 LEVEL OF CARE SCREENING TOOL     IDENTIFICATION  Patient Name: Paula Bird Birthdate: 06-Feb-1926 Sex: female Admission Date (Current Location): 10/08/2015  [facility] and [place] Number:  Producer, television/film/video and Address:  [facility],  [address] [place] [address]      Provider Number: [id number]  Attending Physician Name and Address:  Leatha Gilding, MD  Relative Name and Phone Number:       Current Level of Care: Hospital Recommended Level of Care: Assisted Living Facility Prior Approval Number:    Date Approved/Denied:   PASRR Number: [id number]  Discharge Plan: SNF    Current Diagnoses: Patient Active Problem List   Diagnosis Date Noted  . Vertigo 10/08/2015  . Colon polyp 08/30/2015  . Arthritis, degenerative 08/30/2015  . Acid indigestion 08/30/2015  . Psoriasis 08/30/2015  . Avitaminosis D 08/30/2015  . HCAP (healthcare-associated pneumonia) 08/08/2015  . Respiratory distress 08/08/2015  . Essential hypertension 08/08/2015  . COPD exacerbation (HCC) 08/08/2015  . Dyspnea 08/04/2015  . Hypertension 08/04/2015  . Depression 08/04/2015  . Insomnia 08/04/2015  . Obstructive chronic bronchitis with acute exacerbation (HCC) 08/04/2015  . Thrombocytopenia (HCC) 08/04/2015  . Paroxysmal atrial fibrillation (HCC) 08/04/2015  . Acute exacerbation of chronic obstructive bronchitis (HCC) 08/04/2015  . Hypothyroidism 08/04/2015  . Infection of urinary tract 07/01/2014  . Bradycardia 11/24/2013  . Fall in home 11/22/2013  . Pulmonary hypertension (HCC) 11/19/2013  . Excessive falling 09/07/2012  . Encounter for general adult medical examination without abnormal findings 03/24/2012  . Atrial paroxysmal tachycardia (HCC) 10/05/2011  . Family history of colon cancer 05/15/2011  . Mass of pelvis 05/15/2011    Orientation RESPIRATION BLADDER Height & Weight     Self, Time, Situation, Place  Normal Continent Weight:  64.32 kg (141 lb 12.8 oz) Height:   (157.5 cm)  BEHAVIORAL SYMPTOMS/MOOD NEUROLOGICAL BOWEL NUTRITION STATUS  Other (Comment) (No behaviors)   Continent Diet (Heart Healthy)  AMBULATORY STATUS COMMUNICATION OF NEEDS Skin   Limited Assist Verbally Normal                       Personal Care Assistance Level of Assistance  Bathing, Feeding, Dressing Bathing Assistance: Limited assistance Feeding assistance: Independent Dressing Assistance: Limited assistance     Functional Limitations Info  Sight, Hearing, Speech Sight Info: Adequate Hearing Info: Adequate Speech Info: Adequate    SPECIAL CARE FACTORS FREQUENCY  PT (By licensed PT), OT (By licensed OT)     PT Frequency: 5 x wk OT Frequency: 5x wk            Contractures Contractures Info: Not present    Additional Factors Info  Code Status, Psychotropic, Isolation Precautions Code Status Info: DNR       Isolation Precautions Info: 10/09/15 MRSA by PCR     Current Medications (10/10/2015):  This is the current hospital active medication list Current Facility-Administered Medications  Medication Dose Route Frequency Provider Last Rate Last Dose  . acetaminophen (TYLENOL) tablet 650 mg  650 mg Oral Q4H PRN Hillary Bow, DO      . amiodarone (PACERONE) tablet 200 mg  200 mg Oral Daily Hillary Bow, DO   200 mg at 10/10/15 1051  . amitriptyline (ELAVIL) tablet 75 mg  75 mg Oral QHS Hillary Bow, DO   75 mg at 10/09/15 2131  . aspirin tablet 325 mg  325 mg Oral Daily Hillary Bow,  DO   325 mg at 10/10/15 1052  . bisacodyl (DULCOLAX) suppository 10 mg  10 mg Rectal Daily PRN Hillary BowJared M Gardner, DO      . Chlorhexidine Gluconate Cloth 2 % PADS 6 each  6 each Topical Q0600 Leatha Gildingostin M Gherghe, MD   6 each at 10/10/15 1107  . cholecalciferol (VITAMIN D) tablet 2,000 Units  2,000 Units Oral Daily Hillary BowJared M Gardner, DO   2,000 Units at 10/10/15 1052  . diltiazem (CARDIZEM CD) 24 hr capsule 180 mg  180 mg Oral Daily  Hillary BowJared M Gardner, DO   180 mg at 10/10/15 1052  . enoxaparin (LOVENOX) injection 40 mg  40 mg Subcutaneous Q24H Hillary BowJared M Gardner, DO   40 mg at 10/10/15 1051  . famotidine (PEPCID) tablet 20 mg  20 mg Oral BID Hillary BowJared M Gardner, DO   20 mg at 10/09/15 1042  . hydrochlorothiazide (HYDRODIURIL) tablet 25 mg  25 mg Oral Q M,W,F Hillary BowJared M Gardner, DO   25 mg at 10/10/15 1052  . ipratropium-albuterol (DUONEB) 0.5-2.5 (3) MG/3ML nebulizer solution 3 mL  3 mL Nebulization Q4H PRN Hillary BowJared M Gardner, DO      . meclizine (ANTIVERT) tablet 12.5-25 mg  12.5-25 mg Oral TID PRN Hillary BowJared M Gardner, DO      . mirtazapine (REMERON) tablet 7.5 mg  7.5 mg Oral QHS Hillary BowJared M Gardner, DO   7.5 mg at 10/09/15 2132  . mupirocin ointment (BACTROBAN) 2 % 1 application  1 application Nasal BID Leatha Gildingostin M Gherghe, MD   1 application at 10/10/15 1051  . PARoxetine (PAXIL) tablet 40 mg  40 mg Oral Daily Hillary BowJared M Gardner, DO   40 mg at 10/10/15 1052  . polyethylene glycol (MIRALAX / GLYCOLAX) packet 17 g  17 g Oral Daily Hillary BowJared M Gardner, DO   17 g at 10/10/15 1052  . promethazine (PHENERGAN) tablet 25 mg  25 mg Oral Q6H PRN Hillary BowJared M Gardner, DO      . saccharomyces boulardii (FLORASTOR) capsule 250 mg  250 mg Oral BID Hillary BowJared M Gardner, DO   250 mg at 10/10/15 1052  . senna-docusate (Senokot-S) tablet 1 tablet  1 tablet Oral BID Hillary BowJared M Gardner, DO   1 tablet at 10/09/15 0100  . SUMAtriptan (IMITREX) tablet 50 mg  50 mg Oral Daily PRN Hillary BowJared M Gardner, DO      . traZODone (DESYREL) tablet 75 mg  75 mg Oral QHS Hillary BowJared M Gardner, DO   75 mg at 10/09/15 2131     Discharge Medications: Please see discharge summary for a list of discharge medications.  Relevant Imaging Results:  Relevant Lab Results:   Additional Information SSN: [id number]  Haidinger, Dickey GaveJamie Lee, LCSW

## 2015-10-10 NOTE — Discharge Summary (Signed)
Physician Discharge Summary  Paula Bird WUJ:[id number] DOB: Aug 03, 1925 DOA: 10/08/2015  PCP: [name], MD  Admit date: 10/08/2015 Discharge date: 10/10/2015  Admitted From: ALF Disposition:  SNF  Recommendations for Outpatient Follow-up:  1. Follow up with PCP in 1-2 weeks 2. Check TSH in 3-4 weeks  Discharge Condition: stable CODE STATUS: DNR Diet recommendation: Regular  HPI: 82 y.o. female with medical history significant of frequent falls and complications of those for the past 2 years including admission for SDH in May 2016, PAF not on anticoagulation due to frequently falling. Patient presents to the ED with c/o acute worsening of her chronic dizziness. While her balance is poor at baseline it suddenly became worse over the past 2 days to the point where she feels dizzy laying down or getting up. Has associated headache that is "difficult to describe". Has nausea, no vomiting. Nothing makes symptoms better, getting up or laying down makes them worse. Symptoms are severe  Hospital Course: Discharge Diagnoses:  Principal Problem:   Excessive falling Active Problems:   Hypertension   Hypothyroidism   Vertigo  Excessive falling with vertigo - MRI negative for CVA, PT evaluated, SNF on d/c, likely due to symptomatic hyperthyroidism  Hyperthyroidism - diagnosed with hypothyroidism in the past, has been on synthroid it appears for several years and at one point as high as 175 mcg. This has been managed at [facility] and I have limited access to that information, but it appears that the highest TSH was ~14, few years back. Following treatment her dose has been decreased several times for persistently low TSH, and now she is on 25 mcg and TSH is < 0.01. She will need to stop synthroid altogether at this point and have her TSH checked in 2-3 weeks as an outpatient. She is quite fidgety and tremulous in the room. Her constellation of symptoms are likely caused by  this. HTN - continue home meds PAF - CHADSVASC score at least 4, no A/C due to multiple falls, continue amiodarone COPD - no wheezing, stable  Discharge Instructions    Medication List    STOP taking these medications        levothyroxine 25 MCG tablet  Commonly known as:  SYNTHROID, LEVOTHROID      TAKE these medications        acetaminophen 325 MG tablet  Commonly known as:  TYLENOL  Take 650 mg by mouth every 4 (four) hours as needed for mild pain, moderate pain, fever or headache.     amiodarone 200 MG tablet  Commonly known as:  PACERONE  Take 200 mg by mouth daily.     amitriptyline 75 MG tablet  Commonly known as:  ELAVIL  Take 75 mg by mouth at bedtime.     aspirin 325 MG tablet  Take 325 mg by mouth daily.     bisacodyl 10 MG suppository  Commonly known as:  DULCOLAX  Place 10 mg rectally daily as needed for moderate constipation.     Cranberry 425 MG Caps  Take 425 mg by mouth 2 (two) times daily.     diltiazem 180 MG 24 hr capsule  Commonly known as:  CARDIZEM CD  Take 180 mg by mouth daily.     famotidine 20 MG tablet  Commonly known as:  PEPCID  Take 1 tablet (20 mg total) by mouth 2 (two) times daily.     hydrochlorothiazide 25 MG tablet  Commonly known as:  HYDRODIURIL  Take 1 tablet (  25 mg total) by mouth every Monday, Wednesday, and Friday.     ipratropium-albuterol 0.5-2.5 (3) MG/3ML Soln  Commonly known as:  DUONEB  Take 3 mLs by nebulization every 4 (four) hours as needed.     mirtazapine 7.5 MG tablet  Commonly known as:  REMERON  Take 7.5 mg by mouth at bedtime.     MULTIVITAMIN GUMMIES ADULT PO  Take 1 tablet by mouth daily.     PARoxetine 40 MG tablet  Commonly known as:  PAXIL  Take 40 mg by mouth daily.     polyethylene glycol packet  Commonly known as:  MIRALAX / GLYCOLAX  Take 17 g by mouth daily.     PROCEL Powd  Take 2 scoop by mouth 2 (two) times daily.     promethazine 25 MG tablet  Commonly known as:   PHENERGAN  Take 25 mg by mouth every 6 (six) hours as needed for nausea or vomiting.     saccharomyces boulardii 250 MG capsule  Commonly known as:  FLORASTOR  Take 250 mg by mouth 2 (two) times daily.     sennosides-docusate sodium 8.6-50 MG tablet  Commonly known as:  SENOKOT-S  Take 1 tablet by mouth 2 (two) times daily.     SUMAtriptan 25 MG tablet  Commonly known as:  IMITREX  Take 50 mg by mouth daily as needed for migraine. May repeat in 2 hours if headache persists or recurs.     traZODone 150 MG tablet  Commonly known as:  DESYREL  Take 75 mg by mouth at bedtime. Take 1/2 of a 150 mg tablet to - 75 mg po QHS     Vitamin D3 2000 units Tabs  Take 1 tablet by mouth daily.           Follow-up Information    Follow up with [name], MD. Schedule an appointment as soon as possible for a visit in 2 weeks.   Specialty:  Family Medicine   Contact information:   [address] [place] [address] [phone]      No Known Allergies  Consultations:  None   Procedures/Studies:  None   Dg Chest 2 View  10/08/2015  CLINICAL DATA:  83 year old female with cough and dizziness EXAM: CHEST  2 VIEW COMPARISON:  Chest CT dated 08/14/2015 FINDINGS: Two views of the chest demonstrate oral mild emphysematous changes of the lungs. There is no focal consolidation, pleural effusion, or pneumothorax. Right paratracheal density corresponds to the brachiocephalic trunk. The cardiac silhouette is within normal limits. There is osteopenia with degenerative changes of the spine. Stable appearing compression deformity of the mid thoracic vertebra. Lower thoracic vertebroplasty. No acute osseous pathology. IMPRESSION: No active cardiopulmonary disease. Electronically Signed   By: Elgie Collard M.D.   On: 10/08/2015 23:27   Ct Head Wo Contrast  10/08/2015  CLINICAL DATA:  82 year old female recurrent falls and headaches. EXAM: CT HEAD WITHOUT CONTRAST TECHNIQUE:  Contiguous axial images were obtained from the base of the skull through the vertex without intravenous contrast. COMPARISON:  08/27/2014 and prior exams FINDINGS: Atrophy and chronic small-vessel white matter ischemic changes are noted. An unchanged 5 mm right frontal parafalcine meningioma is now better visualized on the axial and sagittal images. No acute intracranial abnormalities are identified, including mass effect, hydrocephalus, extra-axial fluid collection, midline shift, hemorrhage, or acute infarction. The visualized bony calvarium is unremarkable. IMPRESSION: No evidence of acute intracranial abnormality. Atrophy, chronic small-vessel white matter ischemic changes  and stable 5 mm right frontal parafalcine meningioma. Electronically Signed   By: Harmon Pier M.D.   On: 10/08/2015 20:56   Mr Brain Wo Contrast  10/09/2015  CLINICAL DATA:  Vertigo. Acute worsening of chronic dizziness beginning 2 days ago. Frequent falls. EXAM: MRI HEAD WITHOUT CONTRAST TECHNIQUE: Multiplanar, multiecho pulse sequences of the brain and surrounding structures were obtained without intravenous contrast. COMPARISON:  CT head without contrast 10/08/2015. MRI brain 08/20/2014. FINDINGS: The diffusion-weighted images demonstrate no acute or subacute infarction. Moderate periventricular and diffuse subcortical T2 changes bilaterally are not significantly changed. Previously noted subdural collection on the right has resolved. A frontal parafalcine meningioma is stable. No acute infarct, hemorrhage, or new mass lesion is present. The internal auditory canals are within normal limits bilaterally. Flow is present in the major intracranial arteries. Bilateral lens replacements are present. Globes and orbits are otherwise intact. The paranasal sinuses are clear. There is fluid in the mastoid air cells bilaterally. No obstructing nasopharyngeal lesion is present. The skullbase is within normal limits. Midline sagittal images are  unremarkable. IMPRESSION: 1. No acute intracranial abnormality or significant interval change. 2. Stable moderate periventricular and diffuse subcortical white matter disease bilaterally. This likely reflects the sequela of chronic microvascular ischemia. 3. Resolution of right subdural hematoma seen 1 year ago. Electronically Signed   By: Marin Roberts M.D.   On: 10/09/2015 09:31      Subjective: - no chest pain, shortness of breath, no abdominal pain, nausea or vomiting.   Discharge Exam: Filed Vitals:   10/10/15 0500 10/10/15 1450  BP: 136/70 123/86  Pulse: 71 80  Temp: 98.1 F (36.7 C) 97.7 F (36.5 C)  Resp: 19 18   Filed Vitals:   10/09/15 1314 10/09/15 2125 10/10/15 0500 10/10/15 1450  BP: 144/58 155/59 136/70 123/86  Pulse: 73 71 71 80  Temp: 97.7 F (36.5 C) 98.3 F (36.8 C) 98.1 F (36.7 C) 97.7 F (36.5 C)  TempSrc: Oral Oral Oral Oral  Resp: 18 20 19 18   Height:      Weight:      SpO2: 99% 96% 94% 94%   General: Pt is alert, awake, not in acute distress Cardiovascular: irregular Respiratory: CTA bilaterally, no wheezing, no rhonchi Abdominal: Soft, NT, ND, bowel sounds + Extremities: no edema, no cyanosis  The results of significant diagnostics from this hospitalization (including imaging, microbiology, ancillary and laboratory) are listed below for reference.     Microbiology: Recent Results (from the past 240 hour(s))  MRSA PCR Screening     Status: Abnormal   Collection Time: 10/09/15  4:35 AM  Result Value Ref Range Status   MRSA by PCR POSITIVE (A) NEGATIVE Final    Comment:        The GeneXpert MRSA Assay (FDA approved for NASAL specimens only), is one component of a comprehensive MRSA colonization surveillance program. It is not intended to diagnose MRSA infection nor to guide or monitor treatment for MRSA infections. RESULT CALLED TO, READ BACK BY AND VERIFIED WITH: D WILLIAMSJONES AT 0920 ON 07.16.2017 BYNBROOKS       Labs: BNP (last 3 results) No results for input(s): BNP in the last 8760 hours. Basic Metabolic Panel:  Recent Labs Lab 10/08/15 1947  NA 139  K 4.1  CL 106  CO2 25  GLUCOSE 104*  BUN 16  CREATININE 0.44  CALCIUM 9.0   Liver Function Tests:  Recent Labs Lab 10/08/15 1947  AST 43*  ALT 23  ALKPHOS 45  BILITOT 0.6  PROT 6.7  ALBUMIN 3.2*   CBC:  Recent Labs Lab 10/08/15 1947  WBC 5.1  NEUTROABS 2.4  HGB 11.2*  HCT 35.7*  MCV 78.6  PLT 151   Thyroid function studies  Recent Labs  10/09/15 0006  TSH <0.010*   Anemia work up No results for input(s): VITAMINB12, FOLATE, FERRITIN, TIBC, IRON, RETICCTPCT in the last 72 hours. Urinalysis    Component Value Date/Time   COLORURINE YELLOW 10/08/2015 2102   APPEARANCEUR CLOUDY* 10/08/2015 2102   LABSPEC 1.013 10/08/2015 2102   PHURINE 7.5 10/08/2015 2102   GLUCOSEU NEGATIVE 10/08/2015 2102   HGBUR NEGATIVE 10/08/2015 2102   BILIRUBINUR NEGATIVE 10/08/2015 2102   KETONESUR NEGATIVE 10/08/2015 2102   PROTEINUR NEGATIVE 10/08/2015 2102   UROBILINOGEN 0.2 08/13/2014 2256   NITRITE NEGATIVE 10/08/2015 2102   LEUKOCYTESUR SMALL* 10/08/2015 2102   Sepsis Labs Invalid input(s): PROCALCITONIN,  WBC,  LACTICIDVEN Microbiology Recent Results (from the past 240 hour(s))  MRSA PCR Screening     Status: Abnormal   Collection Time: 10/09/15  4:35 AM  Result Value Ref Range Status   MRSA by PCR POSITIVE (A) NEGATIVE Final    Comment:        The GeneXpert MRSA Assay (FDA approved for NASAL specimens only), is one component of a comprehensive MRSA colonization surveillance program. It is not intended to diagnose MRSA infection nor to guide or monitor treatment for MRSA infections. RESULT CALLED TO, READ BACK BY AND VERIFIED WITH: D WILLIAMSJONES AT 0920 ON 07.16.2017 BYNBROOKS     Time coordinating discharge: Over 30 minutes  SIGNED:  Pamella PertGHERGHE, Vanice Rappa, MD  [facility] 10/10/2015, 2:53 PM Pager  [phone]  If 7PM-7AM, please contact night-coverage [email or web address] Password [email or web address]

## 2015-10-10 NOTE — Clinical Social Work Note (Signed)
Clinical Social Work Assessment  Patient Details  Name: Paula Bird MRN: [id number] Date of Birth: 1925/12/02  Date of referral:  10/10/15               Reason for consult:  Facility Placement                Permission sought to share information with:  Case Manager Permission granted to share information::  Yes, Verbal Permission Granted  Name::     SNF  Agency::  ALF: [facility]  Relationship::     Contact Information:  Son: Luvenia Heller [phone]  Housing/Transportation Living arrangements for the past 2 months:  [place], [place] of Information:  Patient, Adult Children Patient Interpreter Needed:  None Criminal Activity/Legal Involvement Pertinent to Current Situation/Hospitalization:  No - Comment as needed Significant Relationships:  Adult Children Lives with:  Facility Resident Do you feel safe going back to the place where you live?  Yes Need for family participation in patient care:  Yes (Comment)  Care giving concerns:  Patient was recommended for SNF. Patient very tearful about having to return to SNF. Patient expressed," I would rather die than continue to deal with this, having to go place to place. I cannnot sleep and I have dream when I have to go somewhere new. Just let me go back."  Patient and son reports she has been receiving physical therapy services at her current ALF-[facility]. Patient is also involved with community agency that coordinates physical therapy. Patient and son both could not recall name of agency.   Social Worker assessment / plan:  LCSWA met with patient at bedside, pt was agreeable of Assessment. Patient reports she has been at ALF after leaving a SNF about a month ago. Patient reports she likes the ALF and does not want to go anywhere else. Patient son-Guy agreed and recommended patient return there if possible. Patient son expressed she has done well at the facility and she has  found comfort there. Patient reports it has been difficult for her to ambulate due to laying for long periods of time. Patient reports falling but states, "I am using PT to keep me strong".  Plan: Assist patient and family with best possible choice for family and patient overall wellbeing.   Employment status:  Retired Forensic scientist:  Managed Care PT Recommendations:  [place] / Referral to community resources:     Patient/Family's Response to care:  Agreeable for patient to return to ALF.  Patient/Family's Understanding of and Emotional Response to Diagnosis, Current Treatment, and Prognosis: " I want to go back to [facility]". Family understand patient is against ALF, but want patient to receive services she needs. Patient and family reports she has been receiving services and want to continue them instead of finding a new SNF placement at this time.   Emotional Assessment Appearance:  Appears stated age Attitude/Demeanor/Rapport:  Crying (Cooperative) Affect (typically observed):  Accepting, Pleasant, Overwhelmed, Hopeless Orientation:  Oriented to Self, Oriented to Place, Oriented to Situation Alcohol / Substance use:  Not Applicable Psych involvement (Current and /or in the community):  No (Comment)  Discharge Needs  Concerns to be addressed:  No discharge needs identified Readmission within the last 30 days:  Yes Current discharge risk:  None Barriers to Discharge:  No Barriers Identified   Lia Hopping, LCSW 10/10/2015, 5:21 PM

## 2015-10-10 NOTE — Evaluation (Signed)
Occupational Therapy Evaluation Patient Details Name: Paula Bird MRN: [id number] DOB: 1925-04-20 Today's Date: 10/10/2015    History of Present Illness 82 yo female admitted with excessive falling. Hx of vertigo, HTN, PAF, COPD.    Clinical Impression   Pt admitted with exscessive falling. Pt currently with functional limitations due to the deficits listed below (see OT Problem List).  Pt will benefit from skilled OT to increase their safety and independence with ADL and functional mobility for ADL to facilitate discharge to venue listed below.      Follow Up Recommendations  SNF          Precautions / Restrictions Precautions Precautions: Fall Precaution Comments: chronic fall risk      Mobility Bed Mobility Overal bed mobility: Needs Assistance Bed Mobility: Supine to Sit     Supine to sit: Min assist     General bed mobility comments: close guard for safety. Increased time.   Transfers Overall transfer level: Needs assistance Equipment used: None;1 person hand held assist Transfers: Sit to/from [employer] CorporationStand;Stand Pivot Transfers Sit to Stand: Min assist Stand pivot transfers: Min assist       General transfer comment: VC for hand placement    Balance             Standing balance-Leahy Scale: Poor                              ADL Overall ADL's : Needs assistance/impaired     Grooming: Minimal assistance;Sitting   Upper Body Bathing: Minimal assitance;Sitting   Lower Body Bathing: Maximal assistance;Sit to/from stand;Cueing for sequencing;Cueing for compensatory techniques   Upper Body Dressing : Minimal assistance;Sitting   Lower Body Dressing: Maximal assistance;Sit to/from stand;Cueing for sequencing;Cueing for safety   Toilet Transfer: Moderate assistance;BSC;Stand-pivot   Toileting- Clothing Manipulation and Hygiene: Maximal assistance;Sit to/from stand;Cueing for safety                         Pertinent Vitals/Pain  Pain Assessment: No/denies pain        Extremity/Trunk Assessment         Cervical / Trunk Assessment Cervical / Trunk Assessment: Kyphotic   Communication Communication Communication: No difficulties   Cognition Arousal/Alertness: Awake/alert Behavior During Therapy: WFL for tasks assessed/performed Overall Cognitive Status: Within Functional Limits for tasks assessed                                Home Living Family/patient expects to be discharged to:: Skilled nursing facility Living Arrangements: Alone   Type of Home: House                       Home Equipment: Wheelchair - [employer] Corporationmanual;Walker - 4 wheels          Prior Functioning/Environment Level of Independence: Needs assistance  Gait / Transfers Assistance Needed: uses wheelchair primarily. Pt does a little walking with rollator and assistance ADL's / Homemaking Assistance Needed: pt took WC to bathroom but was able to transfer to toilet        OT Diagnosis: Generalized weakness   OT Problem List: Decreased strength;Decreased activity tolerance;Decreased safety awareness;Impaired balance (sitting and/or standing)   OT Treatment/Interventions: Self-care/ADL training;Patient/family education;DME and/or AE instruction    OT Goals(Current goals can be found in the care plan section) Acute Rehab OT Goals Patient Stated  Goal: to find out what's causing the dizziness OT Goal Formulation: With patient Time For Goal Achievement: 10/24/15 Potential to Achieve Goals: Good ADL Goals Pt Will Perform Lower Body Bathing: with min guard assist;sit to/from stand Pt Will Transfer to Toilet: with min guard assist;bedside commode Pt Will Perform Toileting - Clothing Manipulation and hygiene: with min guard assist;sit to/from stand  OT Frequency: Min 2X/week              End of Session Nurse Communication: Mobility status  Activity Tolerance: Patient tolerated treatment well Patient left: in  chair;with call bell/phone within reach   Time: 1212-1230 OT Time Calculation (min): 18 min Charges:  OT General Charges $OT Visit: 1 Procedure OT Evaluation $OT Eval Moderate Complexity: 1 Procedure G-Codes: OT G-codes **NOT FOR INPATIENT CLASS** Functional Assessment Tool Used: clinical observation Functional Limitation: Self care Self Care Current Status (B1478): At least 60 percent but less than 80 percent impaired, limited or restricted Self Care Goal Status (G9562): At least 1 percent but less than 20 percent impaired, limited or restricted  Tomeko Scoville, Metro Kung 10/10/2015, 12:42 PM

## 2015-10-10 NOTE — Clinical Social Work Placement (Signed)
   CLINICAL SOCIAL WORK PLACEMENT  NOTE  Date:  10/10/2015  Patient Details  Name: Lorenza Evangelistvelyn Neuharth MRN: [id number] Date of Birth: 1925/12/02  Clinical Social Work is seeking post-discharge placement for this patient at the Assisted Living Facility level of care (*CSW will initial, date and re-position this form in  chart as items are completed):  Yes   Patient/family provided with [facility] Clinical Social Work Department's list of facilities offering this level of care within the geographic area requested by the patient (or if unable, by the patient's family).  Yes   Patient/family informed of their freedom to choose among providers that offer the needed level of care, that participate in Medicare, Medicaid or managed care program needed by the patient, have an available bed and are willing to accept the patient.  Yes   Patient/family informed of [facility]'s ownership interest in [facility] and [facility], as well as of the fact that they are under no obligation to receive care at these facilities.  PASRR submitted to EDS on       PASRR number received on       Existing PASRR number confirmed on 10/10/15     FL2 transmitted to all facilities in geographic area requested by pt/family on       FL2 transmitted to all facilities within larger geographic area on 10/10/15     Patient informed that his/her managed care company has contracts with or will negotiate with certain facilities, including the following:        Yes   Patient/family informed of bed offers received.  Patient chooses bed at Other - please specify in the comment section below: ([facility])     Physician recommends and patient chooses bed at      Patient to be transferred to Other - please specify in the comment section below: Chip Boer([facility]) on 10/10/15.  Patient to be transferred to facility by PT[facility]     Patient family notified on 10/10/15 of  transfer.  Name of family member notified:  Son-Guy     PHYSICIAN       Additional Comment:    _______________________________________________ Clearance CootsNicole A Apple Dearmas, LCSW 10/10/2015, 5:17 PM

## 2015-11-13 ENCOUNTER — Emergency Department (HOSPITAL_COMMUNITY): Payer: Medicare Other

## 2015-11-13 ENCOUNTER — Encounter (HOSPITAL_COMMUNITY): Payer: Self-pay | Admitting: Emergency Medicine

## 2015-11-13 ENCOUNTER — Emergency Department (HOSPITAL_COMMUNITY)
Admission: EM | Admit: 2015-11-13 | Discharge: 2015-11-13 | Disposition: A | Discharge status: Home / Self Care | Payer: Medicare Other | Attending: Emergency Medicine | Admitting: Emergency Medicine

## 2015-11-13 DIAGNOSIS — Y929 Unspecified place or not applicable: Secondary | ICD-10-CM | POA: Insufficient documentation

## 2015-11-13 DIAGNOSIS — M25552 Pain in left hip: Secondary | ICD-10-CM | POA: Diagnosis not present

## 2015-11-13 DIAGNOSIS — M546 Pain in thoracic spine: Secondary | ICD-10-CM | POA: Insufficient documentation

## 2015-11-13 DIAGNOSIS — W1839XA Other fall on same level, initial encounter: Secondary | ICD-10-CM | POA: Insufficient documentation

## 2015-11-13 DIAGNOSIS — S0990XA Unspecified injury of head, initial encounter: Secondary | ICD-10-CM | POA: Insufficient documentation

## 2015-11-13 DIAGNOSIS — M545 Low back pain, unspecified: Secondary | ICD-10-CM

## 2015-11-13 DIAGNOSIS — Y999 Unspecified external cause status: Secondary | ICD-10-CM | POA: Diagnosis not present

## 2015-11-13 DIAGNOSIS — Y939 Activity, unspecified: Secondary | ICD-10-CM | POA: Insufficient documentation

## 2015-11-13 DIAGNOSIS — I10 Essential (primary) hypertension: Secondary | ICD-10-CM | POA: Insufficient documentation

## 2015-11-13 DIAGNOSIS — W19XXXA Unspecified fall, initial encounter: Secondary | ICD-10-CM

## 2015-11-13 DIAGNOSIS — Z79899 Other long term (current) drug therapy: Secondary | ICD-10-CM | POA: Diagnosis not present

## 2015-11-13 DIAGNOSIS — J441 Chronic obstructive pulmonary disease with (acute) exacerbation: Secondary | ICD-10-CM | POA: Diagnosis not present

## 2015-11-13 DIAGNOSIS — M25551 Pain in right hip: Secondary | ICD-10-CM | POA: Diagnosis not present

## 2015-11-13 MED ORDER — ACETAMINOPHEN 500 MG PO TABS: 1000.0000 mg | ORAL_TABLET | Freq: Once | ORAL | Status: DC

## 2015-11-13 NOTE — ED Triage Notes (Addendum)
[facility]. Pt fell forward as shwe was trying to get to her walker. Fall was witnessed. Pt did not experience a LOC. Pt is not on bloodthinners. Pt is alert and oriented x 4. Pt has complaints of head, neck, and back pain   Pt states she has had problems with her vision and swelling in her legs over the past few weeks and her PCP is looking into it.

## 2015-11-13 NOTE — ED Notes (Signed)
PTAR Picking up patient. 

## 2015-11-13 NOTE — ED Notes (Signed)
Bed: WA02 Expected date:  Expected time:  Means of arrival:  Comments: Fall

## 2015-11-13 NOTE — ED Provider Notes (Signed)
[facility]-EMERGENCY DEPT Provider Note   CSN: [id number] Arrival date & time: 11/13/15  1816     History   Chief Complaint Chief Complaint  Patient presents with  . Fall    HPI Paula Bird is a 82 y.o. female.  The history is provided by the patient.  Fall  This is a recurrent problem. The current episode started 1 to 2 hours ago. Associated symptoms include headaches. Pertinent negatives include no chest pain, no abdominal pain and no shortness of breath. Nothing aggravates the symptoms. Nothing relieves the symptoms. She has tried nothing for the symptoms.   Mechanical fall while trying to stand up with her walker. Admits to hitting her head, but no LOC. No blood thinners. Complains of headache, neck and back pain.    Past Medical History:  Diagnosis Date  . Acute and chronic respiratory failure, unspecified whether with hypoxia or hypercapnia (HCC)   . Anemia   . Constipation   . COPD (chronic obstructive pulmonary disease) (HCC)   . Depression   . Dysphagia   . Esophageal dysmotility   . Hypertension   . Insomnia   . Leukocytosis   . Oral thrush   . PAF (paroxysmal atrial fibrillation) (HCC)   . Physical deconditioning   . Protein calorie malnutrition (HCC)   . Thyroid disease   . Vertigo     Patient Active Problem List   Diagnosis Date Noted  . Vertigo 10/08/2015  . Colon polyp 08/30/2015  . Arthritis, degenerative 08/30/2015  . Acid indigestion 08/30/2015  . Psoriasis 08/30/2015  . Avitaminosis D 08/30/2015  . HCAP (healthcare-associated pneumonia) 08/08/2015  . Respiratory distress 08/08/2015  . Essential hypertension 08/08/2015  . COPD exacerbation (HCC) 08/08/2015  . Dyspnea 08/04/2015  . Hypertension 08/04/2015  . Depression 08/04/2015  . Insomnia 08/04/2015  . Obstructive chronic bronchitis with acute exacerbation (HCC) 08/04/2015  . Thrombocytopenia (HCC) 08/04/2015  . Paroxysmal atrial fibrillation (HCC) 08/04/2015  . Acute exacerbation of  chronic obstructive bronchitis (HCC) 08/04/2015  . Hypothyroidism 08/04/2015  . Infection of urinary tract 07/01/2014  . Bradycardia 11/24/2013  . Fall in home 11/22/2013  . Pulmonary hypertension (HCC) 11/19/2013  . Excessive falling 09/07/2012  . Encounter for general adult medical examination without abnormal findings 03/24/2012  . Atrial paroxysmal tachycardia (HCC) 10/05/2011  . Family history of colon cancer 05/15/2011  . Mass of pelvis 05/15/2011    Past Surgical History:  Procedure Laterality Date  . ABDOMINAL HYSTERECTOMY    . APPENDECTOMY    . TOTAL HIP ARTHROPLASTY Left   . TOTAL KNEE ARTHROPLASTY Left     OB History    No data available       Home Medications    Prior to Admission medications   Medication Sig Start Date End Date Taking? Authorizing Provider  amiodarone (PACERONE) 200 MG tablet Take 200 mg by mouth daily.    Yes Historical Provider, MD  amitriptyline (ELAVIL) 75 MG tablet Take 75 mg by mouth at bedtime. 07/12/15  Yes Historical Provider, MD  cholecalciferol (VITAMIN D) 1000 units tablet Take 2,000 Units by mouth daily.   Yes Historical Provider, MD  Cranberry 425 MG CAPS Take 425 mg by mouth 2 (two) times daily.   Yes Historical Provider, MD  diltiazem (CARDIZEM LA) 300 MG 24 hr tablet Take 300 mg by mouth daily.   Yes Historical Provider, MD  famotidine (PEPCID) 20 MG tablet Take 1 tablet (20 mg total) by mouth 2 (two) times daily. 08/16/15  Yes Parke Poisson  Roda ShuttersXu, MD  ferrous sulfate 325 (65 FE) MG EC tablet Take 325 mg by mouth daily.   Yes Historical Provider, MD  lisinopril (PRINIVIL,ZESTRIL) 10 MG tablet Take 10 mg by mouth daily.   Yes Historical Provider, MD  mirtazapine (REMERON) 15 MG tablet Take 15 mg by mouth at bedtime.   Yes Historical Provider, MD  Multiple Vitamins-Minerals (MULTIVITAMIN GUMMIES ADULT PO) Take 1 tablet by mouth daily.   Yes Historical Provider, MD  PARoxetine (PAXIL) 40 MG tablet Take 40 mg by mouth daily. 07/23/15  Yes  Historical Provider, MD  polyethylene glycol (MIRALAX / GLYCOLAX) packet Take 17 g by mouth daily. 08/27/14  Yes Arthor CaptainAbigail Harris, PA-C  sennosides-docusate sodium (SENOKOT-S) 8.6-50 MG tablet Take 1 tablet by mouth 2 (two) times daily. 08/16/15  Yes Albertine GratesFang Xu, MD  traMADol (ULTRAM) 50 MG tablet Take 50 mg by mouth 2 (two) times daily.   Yes Historical Provider, MD  traZODone (DESYREL) 150 MG tablet Take 75 mg by mouth at bedtime.    Yes Historical Provider, MD  acetaminophen (TYLENOL) 325 MG tablet Take 650 mg by mouth every 4 (four) hours as needed for mild pain, moderate pain, fever or headache.     Historical Provider, MD  bisacodyl (DULCOLAX) 10 MG suppository Place 10 mg rectally daily as needed for moderate constipation.    Historical Provider, MD  Cholecalciferol (VITAMIN D3) 2000 units TABS Take 1 tablet by mouth daily.    Historical Provider, MD  diltiazem (CARDIZEM CD) 180 MG 24 hr capsule Take 180 mg by mouth daily. 09/21/15   Historical Provider, MD  hydrochlorothiazide (HYDRODIURIL) 25 MG tablet Take 1 tablet (25 mg total) by mouth every Monday, Wednesday, and Friday. 08/16/15   Albertine GratesFang Xu, MD  ipratropium-albuterol (DUONEB) 0.5-2.5 (3) MG/3ML SOLN Take 3 mLs by nebulization every 4 (four) hours as needed. Patient taking differently: Take 3 mLs by nebulization every 4 (four) hours as needed (COPD).  08/06/15   Jeralyn BennettEzequiel Zamora, MD  mirtazapine (REMERON) 7.5 MG tablet Take 7.5 mg by mouth at bedtime. 07/12/15   Historical Provider, MD  promethazine (PHENERGAN) 25 MG tablet Take 25 mg by mouth every 6 (six) hours as needed for nausea or vomiting.    Historical Provider, MD  SUMAtriptan (IMITREX) 25 MG tablet Take 50 mg by mouth daily as needed for migraine. May repeat in 2 hours if headache persists or recurs.    Historical Provider, MD    Family History Family History  Problem Relation Age of Onset  . Hypertension Mother   . Hypertension Son   . Diabetes Neg Hx     Social History Social  History  Substance Use Topics  . Smoking status: Never Smoker  . Smokeless tobacco: Never Used  . Alcohol use No     Allergies   Review of patient's allergies indicates no known allergies.   Review of Systems Review of Systems  Constitutional: Negative for chills, fatigue and fever.  HENT: Negative for congestion.   Eyes: Negative for visual disturbance.  Respiratory: Negative for cough and shortness of breath.   Cardiovascular: Positive for leg swelling. Negative for chest pain.  Gastrointestinal: Negative for abdominal pain, diarrhea, nausea and vomiting.  Genitourinary: Negative for difficulty urinating.  Musculoskeletal: Positive for back pain, gait problem (at baseline) and neck pain.  Neurological: Positive for headaches.  All other systems reviewed and are negative.    Physical Exam Updated Vital Signs BP 147/58 (BP Location: Right Arm)   Pulse 75  Temp 98.2 F (36.8 C) (Oral)   Resp 18   SpO2 92%   Physical Exam  Constitutional: She is oriented to person, place, and time. She appears well-developed and well-nourished. No distress.  HENT:  Head: Normocephalic and atraumatic.  Right Ear: External ear normal.  Left Ear: External ear normal.  Nose: Nose normal.  Eyes: Conjunctivae and EOM are normal. Pupils are equal, round, and reactive to light. Right eye exhibits no discharge. Left eye exhibits no discharge. No scleral icterus.  Neck: Normal range of motion. Neck supple. Spinous process tenderness and muscular tenderness present.  Cardiovascular: Normal rate, regular rhythm and normal heart sounds.  Exam reveals no gallop and no friction rub.   No murmur heard. Pulses:      Radial pulses are 2+ on the right side, and 2+ on the left side.       Dorsalis pedis pulses are 2+ on the right side, and 2+ on the left side.  Pulmonary/Chest: Effort normal and breath sounds normal. No stridor. No respiratory distress. She has no wheezes.  Abdominal: Soft. She exhibits  no distension. There is no tenderness.  Musculoskeletal: She exhibits no edema.       Right hip: She exhibits tenderness.       Left hip: She exhibits tenderness.       Cervical back: She exhibits tenderness.       Thoracic back: She exhibits tenderness.       Lumbar back: She exhibits tenderness.  Clavicles stable. Chest stable to AP/Lat compression Pelvis stable to Lat compression No obvious extremity deformity  Neurological: She is alert and oriented to person, place, and time.  Moving all extremities  Skin: Skin is warm and dry. No rash noted. She is not diaphoretic. No erythema.  Psychiatric: She has a normal mood and affect.     ED Treatments / Results  Labs (all labs ordered are listed, but only abnormal results are displayed) Labs Reviewed - No data to display  EKG  EKG Interpretation None       Radiology Dg Thoracic Spine W/swimmers  Result Date: 11/13/2015 CLINICAL DATA:  Fall today while trying to get walker with upper back pain, initial encounter EXAM: THORACIC SPINE - 3 VIEWS COMPARISON:  08/14/2015 FINDINGS: There are again noted multiple compression fractures stable from the prior exam. Changes of prior vertebral augmentation are again seen and stable. No new focal abnormality is seen. Tortuous vascularity projects over the right lung apex. No other focal abnormality is seen. IMPRESSION: Stable compression deformities.  No acute abnormality noted. Electronically Signed   By: Alcide CleverMark  Lukens M.D.   On: 11/13/2015 19:44   Dg Lumbar Spine Complete  Result Date: 11/13/2015 CLINICAL DATA:  Recent fall while trying to get walker with low back pain, initial encounter EXAM: LUMBAR SPINE - COMPLETE 4+ VIEW COMPARISON:  08/14/2015 FINDINGS: Five lumbar type vertebral bodies are well visualized. Changes of vertebral augmentation are noted at T12 and L2. L1 compression deformity is noted stable from previous exams. Facet hypertrophic changes are noted. Mild anterolisthesis of L4  on L5 is seen likely of a degenerative nature. Aortic calcifications are seen. IMPRESSION: Degenerative change and prior compression deformities. No acute abnormality. Electronically Signed   By: Alcide CleverMark  Lukens M.D.   On: 11/13/2015 19:46   Ct Head Wo Contrast  Result Date: 11/13/2015 CLINICAL DATA:  82 year old female status post fall forward while trying to get to her walker. Head neck and back pain. Initial encounter. EXAM: CT  HEAD WITHOUT CONTRAST CT CERVICAL SPINE WITHOUT CONTRAST TECHNIQUE: Multidetector CT imaging of the head and cervical spine was performed following the standard protocol without intravenous contrast. Multiplanar CT image reconstructions of the cervical spine were also generated. COMPARISON:  Brain MRI 10/09/2015, head CT 10/08/2015, cervical spine CT 08/20/2014, and earlier. FINDINGS: CT HEAD FINDINGS Visualized paranasal sinuses and mastoids are stable and well pneumatized. Calvarium appears stable and intact. No orbits soft tissue or scalp soft tissue injury identified. Calcified atherosclerosis at the skull base. Stable cerebral volume. Intracranial artery dolichoectasia again noted and appears stable. No midline shift, ventriculomegaly, mass effect, evidence of mass lesion, intracranial hemorrhage or evidence of cortically based acute infarction. Patchy and confluent cerebral white matter hypodensity appears stable. A tiny 5 mm anterior right parasagittal meningioma is unchanged with no associated mass effect. CT CERVICAL SPINE FINDINGS Chronic C7-T1 ankylosis. Stable mild anterolisthesis of C6 on C7. Stable T1-T2 alignment. Mildly improved cervical lordosis today. Visualized skull base is intact. No atlanto-occipital dissociation. Widespread cervical facet arthropathy. Bilateral posterior element alignment is within normal limits. No acute cervical spine fracture identified. Visible upper thoracic levels appear grossly intact. Stable lung apices. However, the thyroid has  significantly enlarged in a generalized fashion since May of 2016 (series 8, image 57 today versus series 6 image 70 in 2016). The right lobe now measures up to 3 cm transverse dimension versus 1.8 cm previously. No discrete thyroid nodules identified. No definite soft tissue plain invasion. No level 4 lymphadenopathy. Calcified carotid artery atherosclerosis. Otherwise negative noncontrast neck soft tissues. IMPRESSION: 1. No acute intracranial abnormality. Stable non contrast CT appearance of the brain. 2. No acute fracture or listhesis identified in the cervical spine. Ligamentous injury is not excluded. 3. Generalized enlargement (approximate doubling) of the thyroid since May 2016, which is unusual in this age group. Is there an endocrinopathy or thyroid hormone abnormality? If so a non-emergent thyroid ultrasound may be valuable. Electronically Signed   By: Odessa Fleming M.D.   On: 11/13/2015 19:57   Ct Cervical Spine Wo Contrast  Result Date: 11/13/2015 CLINICAL DATA:  82 year old female status post fall forward while trying to get to her walker. Head neck and back pain. Initial encounter. EXAM: CT HEAD WITHOUT CONTRAST CT CERVICAL SPINE WITHOUT CONTRAST TECHNIQUE: Multidetector CT imaging of the head and cervical spine was performed following the standard protocol without intravenous contrast. Multiplanar CT image reconstructions of the cervical spine were also generated. COMPARISON:  Brain MRI 10/09/2015, head CT 10/08/2015, cervical spine CT 08/20/2014, and earlier. FINDINGS: CT HEAD FINDINGS Visualized paranasal sinuses and mastoids are stable and well pneumatized. Calvarium appears stable and intact. No orbits soft tissue or scalp soft tissue injury identified. Calcified atherosclerosis at the skull base. Stable cerebral volume. Intracranial artery dolichoectasia again noted and appears stable. No midline shift, ventriculomegaly, mass effect, evidence of mass lesion, intracranial hemorrhage or evidence of  cortically based acute infarction. Patchy and confluent cerebral white matter hypodensity appears stable. A tiny 5 mm anterior right parasagittal meningioma is unchanged with no associated mass effect. CT CERVICAL SPINE FINDINGS Chronic C7-T1 ankylosis. Stable mild anterolisthesis of C6 on C7. Stable T1-T2 alignment. Mildly improved cervical lordosis today. Visualized skull base is intact. No atlanto-occipital dissociation. Widespread cervical facet arthropathy. Bilateral posterior element alignment is within normal limits. No acute cervical spine fracture identified. Visible upper thoracic levels appear grossly intact. Stable lung apices. However, the thyroid has significantly enlarged in a generalized fashion since May of 2016 (series 8, image 57 today  versus series 6 image 70 in 2016). The right lobe now measures up to 3 cm transverse dimension versus 1.8 cm previously. No discrete thyroid nodules identified. No definite soft tissue plain invasion. No level 4 lymphadenopathy. Calcified carotid artery atherosclerosis. Otherwise negative noncontrast neck soft tissues. IMPRESSION: 1. No acute intracranial abnormality. Stable non contrast CT appearance of the brain. 2. No acute fracture or listhesis identified in the cervical spine. Ligamentous injury is not excluded. 3. Generalized enlargement (approximate doubling) of the thyroid since May 2016, which is unusual in this age group. Is there an endocrinopathy or thyroid hormone abnormality? If so a non-emergent thyroid ultrasound may be valuable. Electronically Signed   By: Odessa Fleming M.D.   On: 11/13/2015 19:57   Dg Hip Unilat W Or Wo Pelvis 2-3 Views Left  Result Date: 11/13/2015 CLINICAL DATA:  Fall today while trying to get walker with left hip pain, initial encounter EXAM: DG HIP (WITH OR WITHOUT PELVIS) 2-3V LEFT COMPARISON:  None. FINDINGS: Left hip prosthesis is noted. Pelvic ring is intact. Old fractures in the pubic rami on the right are noted. Right hip  degenerative changes are again seen. Multiple calcifications are noted in the buttocks likely related to injection granulomas. No other focal abnormality is seen. IMPRESSION: No acute abnormality noted. Electronically Signed   By: Alcide Clever M.D.   On: 11/13/2015 19:43   Dg Hip Unilat W Or Wo Pelvis 2-3 Views Right  Result Date: 11/13/2015 CLINICAL DATA:  Fall today while trying to get her walker with right hip pain, initial encounter EXAM: DG HIP (WITH OR WITHOUT PELVIS) 2-3V RIGHT COMPARISON:  None. FINDINGS: Degenerative changes of the right hip joint are noted. Changes of prior fractures in the superior and inferior pubic rami are seen. Myositis ossificans is noted above the right iliac crest. No other focal abnormality is noted. IMPRESSION: Chronic changes without acute abnormality. Electronically Signed   By: Alcide Clever M.D.   On: 11/13/2015 19:42    Procedures Procedures (including critical care time)  Medications Ordered in ED Medications  acetaminophen (TYLENOL) tablet 1,000 mg (0 mg Oral Hold 11/13/15 1943)     Initial Impression / Assessment and Plan / ED Course  I have reviewed the triage vital signs and the nursing notes.  Pertinent labs & imaging results that were available during my care of the patient were reviewed by me and considered in my medical decision making (see chart for details).  Clinical Course    Mechanical fall at her nursing facility. Witnessed by the staff who reported that the patient was trying to get up to her walker and fell. Patient reports that she bumped her head however no LOC. Exam without external evidence of injury. However patient complaining of headache neck pain back pain and bilateral hip pain. CT of her head and cervical spine without any acute injuries. Plain films of the thoracic and lumbar spine without any acute injuries. Plain films of bilateral hips without any acute fractures. Next  Patient safe for discharge with strict return  precautions. Patient follow-up with her primary care provider as needed.  Final Clinical Impressions(s) / ED Diagnoses   Final diagnoses:  Fall, initial encounter  Minor head injury, initial encounter  Bilateral low back pain without sciatica  Bilateral thoracic back pain  Hip pain, left  Hip pain, right   Disposition: Discharge  Condition: Good  I have discussed the results, Dx and Tx plan with the patient who expressed understanding and agree(s) with  the plan. Discharge instructions discussed at great length. The patient was given strict return precautions who verbalized understanding of the instructions. No further questions at time of discharge.    Current Discharge Medication List      Follow Up: [name], MD [phone] [facility] [address] [place] [address] [phone]  Call  As needed      Nira Conn, MD 11/13/15 2041

## 2015-12-02 ENCOUNTER — Encounter: Payer: Self-pay | Admitting: Gastroenterology

## 2016-01-05 ENCOUNTER — Ambulatory Visit: Payer: Medicare Other | Admitting: Endocrinology

## 2016-01-24 ENCOUNTER — Encounter: Payer: Self-pay | Admitting: Endocrinology

## 2016-01-24 ENCOUNTER — Ambulatory Visit (INDEPENDENT_AMBULATORY_CARE_PROVIDER_SITE_OTHER): Payer: Medicare Other | Admitting: Endocrinology

## 2016-01-24 VITALS — BP 124/64 | HR 64 | Wt 129.0 lb

## 2016-01-24 DIAGNOSIS — E039 Hypothyroidism, unspecified: Secondary | ICD-10-CM

## 2016-01-24 LAB — TSH: TSH: 0.06 u[IU]/mL — AB (ref 0.35–4.50)

## 2016-01-24 LAB — T3, FREE: T3, Free: 4.4 pg/mL — ABNORMAL HIGH (ref 2.3–4.2)

## 2016-01-24 LAB — T4, FREE: Free T4: 0.74 ng/dL (ref 0.60–1.60)

## 2016-01-24 NOTE — Patient Instructions (Signed)
blood tests are requested for you today.  We'll let you know about the results. You do not need to stop taking the amiodarone--we'll work around that.  Please come back for a follow-up appointment in 3 months.

## 2016-01-24 NOTE — Progress Notes (Signed)
Subjective:    Patient ID: Paula Bird, female    DOB: 01/08/1926, 82 y.o.   MRN: [id number]  HPI Pt was dx'ed with hypothyroidism in 2016.  she was rx'ed tapazole in 2016, for a brief time, but this was changed to synthroid in early 2017.  This was stopped in Aug of 2017, for a suppressed TSH, and tapazole 10+5 mg qd, was started.  she has never had XRT to the anterior neck, or thyroid surgery.  she does not consume kelp or any other non-prescribed thyroid medication.  she has been on amiodarone since approx 2014.  Dtr also provides hx, as pt has memory loss.  Pt has slight dysphagia sensation in the neck and intermittent assoc regurgitation.   Past Medical History:  Diagnosis Date  . Acute and chronic respiratory failure, unspecified whether with hypoxia or hypercapnia   . Anemia   . Constipation   . COPD (chronic obstructive pulmonary disease) (HCC)   . Depression   . Dysphagia   . Esophageal dysmotility   . Hypertension   . Insomnia   . Leukocytosis   . Oral thrush   . PAF (paroxysmal atrial fibrillation) (HCC)   . Physical deconditioning   . Protein calorie malnutrition (HCC)   . Thyroid disease   . Vertigo     Past Surgical History:  Procedure Laterality Date  . ABDOMINAL HYSTERECTOMY    . APPENDECTOMY    . TOTAL HIP ARTHROPLASTY Left   . TOTAL KNEE ARTHROPLASTY Left     Social History   Social History  . Marital status: Widowed    Spouse name: N/A  . Number of children: N/A  . Years of education: N/A   Occupational History  . Not on file.   Social History Main Topics  . Smoking status: Never Smoker  . Smokeless tobacco: Never Used  . Alcohol use No  . Drug use: No  . Sexual activity: Not on file   Other Topics Concern  . Not on file   Social History Narrative  . No narrative on file    Current Outpatient Prescriptions on File Prior to Visit  Medication Sig Dispense Refill  . acetaminophen (TYLENOL) 325 MG tablet Take 650 mg by mouth every 4  (four) hours as needed for mild pain, moderate pain, fever or headache.     Marland Kitchen. amiodarone (PACERONE) 200 MG tablet Take 200 mg by mouth daily.     Marland Kitchen. amitriptyline (ELAVIL) 75 MG tablet Take 75 mg by mouth at bedtime.  10  . bisacodyl (DULCOLAX) 10 MG suppository Place 10 mg rectally daily as needed for moderate constipation.    . cholecalciferol (VITAMIN D) 1000 units tablet Take 2,000 Units by mouth daily.    . Cholecalciferol (VITAMIN D3) 2000 units TABS Take 1 tablet by mouth daily.    . Cranberry 425 MG CAPS Take 425 mg by mouth 2 (two) times daily.    Marland Kitchen. diltiazem (CARDIZEM CD) 180 MG 24 hr capsule Take 180 mg by mouth daily.  10  . diltiazem (CARDIZEM LA) 300 MG 24 hr tablet Take 300 mg by mouth daily.    . famotidine (PEPCID) 20 MG tablet Take 1 tablet (20 mg total) by mouth 2 (two) times daily. 60 tablet 0  . ferrous sulfate 325 (65 FE) MG EC tablet Take 325 mg by mouth daily.    . hydrochlorothiazide (HYDRODIURIL) 25 MG tablet Take 1 tablet (25 mg total) by mouth every Monday, Wednesday, and Friday. 30 tablet  0  . ipratropium-albuterol (DUONEB) 0.5-2.5 (3) MG/3ML SOLN Take 3 mLs by nebulization every 4 (four) hours as needed. (Patient taking differently: Take 3 mLs by nebulization every 4 (four) hours as needed (COPD). ) 360 mL 1  . lisinopril (PRINIVIL,ZESTRIL) 10 MG tablet Take 10 mg by mouth daily.    . mirtazapine (REMERON) 15 MG tablet Take 15 mg by mouth at bedtime.    . mirtazapine (REMERON) 7.5 MG tablet Take 7.5 mg by mouth at bedtime.  10  . Multiple Vitamins-Minerals (MULTIVITAMIN GUMMIES ADULT PO) Take 1 tablet by mouth daily.    Marland Kitchen PARoxetine (PAXIL) 40 MG tablet Take 40 mg by mouth daily.  3  . polyethylene glycol (MIRALAX / GLYCOLAX) packet Take 17 g by mouth daily. 14 each 12  . promethazine (PHENERGAN) 25 MG tablet Take 25 mg by mouth every 6 (six) hours as needed for nausea or vomiting.    . sennosides-docusate sodium (SENOKOT-S) 8.6-50 MG tablet Take 1 tablet by mouth 2  (two) times daily. 30 tablet 0  . SUMAtriptan (IMITREX) 25 MG tablet Take 50 mg by mouth daily as needed for migraine. May repeat in 2 hours if headache persists or recurs.    . traMADol (ULTRAM) 50 MG tablet Take 50 mg by mouth 2 (two) times daily.    . traZODone (DESYREL) 150 MG tablet Take 75 mg by mouth at bedtime.      No current facility-administered medications on file prior to visit.     No Known Allergies  Family History  Problem Relation Age of Onset  . Hypertension Mother   . Hypertension Son   . Diabetes Neg Hx   . Thyroid disease Neg Hx     BP 124/64   Pulse 64   Wt 129 lb (58.5 kg)   SpO2 97%   BMI 23.59 kg/m    Review of Systems denies weight loss, hoarseness, palpitations, sob, diarrhea, excessive diaphoresis, tremor, heat intolerance, and rhinorrhea. She attributes headache to falls, and polyuria to lasix.  She has visual loss, easy bruising, anxiety, and muscle weakness.     Objective:   Physical Exam Vital signs: see vs page Gen: elderly, frail, no distress.  In wheelchair HEAD: head: no deformity eyes: no periorbital swelling, no proptosis external nose and ears are normal mouth: no lesion seen NECK: thyroid is approx 3-5 times normal size, diffuse.  No palpable nodule.   CHEST WALL: no deformity LUNGS: clear to auscultation CV: reg rate and rhythm, no murmur ABD: abdomen is soft, nontender.  no hepatosplenomegaly.  not distended.  no hernia MUSCULOSKELETAL: muscle bulk and strength are grossly normal.  no obvious joint swelling.  gait is normal and steady.   EXTEMITIES: no deformity.  no ulcer on the feet.  feet are of normal color and temp.  Trace bilat leg edema PULSES: dorsalis pedis intact bilat.  NEURO:  cn 2-12 grossly intact, except for right eye lid lag.   readily moves all 4's.  sensation is intact to touch on the feet.   Slight tremor of the hands.  SKIN:  Normal texture and temperature.  No rash or suspicious lesion is visible.   NODES:   None palpable at the neck PSYCH: alert, but not oriented.  Does not appear anxious nor depressed.   CT: Generalized enlargement (approximate doubling) of the thyroid since May 2016, which is unusual in this age group. Is there an endocrinopathy or thyroid hormone abnormality?  Lab Results  Component Value Date  TSH <0.010 (L) 10/09/2015      Assessment & Plan:  Hyperthyroidism: new to me. assoc with amiodarone.   Frail elderly state: she is in nursing facility, so she is not a candidate for RAI.  Dysphagia, very unlikely thyroid-related.    Patient is advised the following: Patient Instructions  blood tests are requested for you today.  We'll let you know about the results. You do not need to stop taking the amiodarone--we'll work around that.  Please come back for a follow-up appointment in 3 months.

## 2016-01-26 ENCOUNTER — Ambulatory Visit: Payer: Medicare Other | Admitting: Internal Medicine

## 2016-01-31 ENCOUNTER — Telehealth: Payer: Self-pay | Admitting: Neurology

## 2016-01-31 ENCOUNTER — Ambulatory Visit (INDEPENDENT_AMBULATORY_CARE_PROVIDER_SITE_OTHER): Payer: Medicare Other | Admitting: Neurology

## 2016-01-31 ENCOUNTER — Encounter: Payer: Self-pay | Admitting: Neurology

## 2016-01-31 ENCOUNTER — Telehealth: Payer: Self-pay | Admitting: Endocrinology

## 2016-01-31 VITALS — BP 143/71 | HR 66

## 2016-01-31 DIAGNOSIS — H02401 Unspecified ptosis of right eyelid: Secondary | ICD-10-CM

## 2016-01-31 DIAGNOSIS — R296 Repeated falls: Secondary | ICD-10-CM | POA: Diagnosis not present

## 2016-01-31 DIAGNOSIS — H5711 Ocular pain, right eye: Secondary | ICD-10-CM | POA: Diagnosis not present

## 2016-01-31 NOTE — Telephone Encounter (Signed)
Called and left patient and message and also her son with Details about seeing Dr. Dione BoozeGroat. Appointment is 02/02/2016 arrive at 1:15 for 1:45 pm apt. Relayed address and telephone number [phone].

## 2016-01-31 NOTE — Telephone Encounter (Signed)
PCP from [facility] called need last office notes for patient  Fax # [phone]

## 2016-01-31 NOTE — Progress Notes (Addendum)
PATIENT: Paula Bird DOB: May 13, 1925  Chief Complaint  Patient presents with  . Eye Pain    She is here with her daughter, Paula Bird.  Reports pain, swelling, redness, discharge and diplopia in her bilateral eyes.  Her right eye is worse than left.  Symptoms have been present for five weeks.  Marland Kitchen PCP    Angela Cox, MD     HISTORICAL  Paula Bird is 82 year old right-handed female, accompanied by her daughter Paula Bird, seen in refer by primary care physician Dr. Angela Cox for evaluation of eye pain, right eye swelling, initial evaluation was on January 31 2016.  I reviewed and summarized the referring note, she had a history of hypertension, paroxysmal atrial fibrillation, depression, obsessive-compulsive disorder, acid reflux, polypharmacy treatment, also had a history of left knee, left hip replacement,  She used to lives alone at home, but began to suffer multiple falls around 2015, has moved to [facility] since 2016.  She was noted to have right side droopy eyelid swelling since September 2017, gradually getting worse, light sensitivity, she had chronic headache, tends to stay at the right side, with her new right eye symptoms, she noticed more right behind eye pain, she was seen by optometrist, was given eyedrops of unknown name, with only mild improvement. Is difficulty for her to open her right eye,  She denies rash broke out, she denies hearing loss, no dysarthria, no dysphagia  Laboratory evaluation in September 2017, normal CMP with creatinine 0.61, normal CBC, with hemoglobin of 12.2, TSH was decreased to 0.008 with high free T3 8.3, free thyroxine1.50,  REVIEW OF SYSTEMS: Full 14 system review of systems performed and notable only for appetite change, fatigue, facial swelling, hearing loss, eye discharge, eye itching, eye redness, double vision, loss of vision, eye pain  ALLERGIES: No Known Allergies  HOME MEDICATIONS: Current  Outpatient Prescriptions  Medication Sig Dispense Refill  . acetaminophen (TYLENOL) 325 MG tablet Take 650 mg by mouth every 4 (four) hours as needed for mild pain, moderate pain, fever or headache.     Marland Kitchen amiodarone (PACERONE) 200 MG tablet Take 200 mg by mouth daily.     Marland Kitchen amitriptyline (ELAVIL) 75 MG tablet Take 75 mg by mouth at bedtime.  10  . bisacodyl (DULCOLAX) 10 MG suppository Place 10 mg rectally daily as needed for moderate constipation.    . cholecalciferol (VITAMIN D) 1000 units tablet Take 2,000 Units by mouth daily.    . Cholecalciferol (VITAMIN D3) 2000 units TABS Take 1 tablet by mouth daily.    . Cranberry 425 MG CAPS Take 425 mg by mouth 2 (two) times daily.    Marland Kitchen diltiazem (CARDIZEM CD) 180 MG 24 hr capsule Take 180 mg by mouth daily.  10  . diltiazem (CARDIZEM LA) 300 MG 24 hr tablet Take 300 mg by mouth daily.    . famotidine (PEPCID) 20 MG tablet Take 1 tablet (20 mg total) by mouth 2 (two) times daily. 60 tablet 0  . ferrous sulfate 325 (65 FE) MG EC tablet Take 325 mg by mouth daily.    . hydrochlorothiazide (HYDRODIURIL) 25 MG tablet Take 1 tablet (25 mg total) by mouth every Monday, Wednesday, and Friday. 30 tablet 0  . ipratropium-albuterol (DUONEB) 0.5-2.5 (3) MG/3ML SOLN Take 3 mLs by nebulization every 4 (four) hours as needed. (Patient taking differently: Take 3 mLs by nebulization every 4 (four) hours as needed (COPD). ) 360 mL 1  . lisinopril (  PRINIVIL,ZESTRIL) 10 MG tablet Take 10 mg by mouth daily.    . mirtazapine (REMERON) 15 MG tablet Take 15 mg by mouth at bedtime.    . mirtazapine (REMERON) 7.5 MG tablet Take 7.5 mg by mouth at bedtime.  10  . Multiple Vitamins-Minerals (MULTIVITAMIN GUMMIES ADULT PO) Take 1 tablet by mouth daily.    Marland Kitchen. PARoxetine (PAXIL) 40 MG tablet Take 40 mg by mouth daily.  3  . polyethylene glycol (MIRALAX / GLYCOLAX) packet Take 17 g by mouth daily. 14 each 12  . promethazine (PHENERGAN) 25 MG tablet Take 25 mg by mouth every 6 (six)  hours as needed for nausea or vomiting.    . sennosides-docusate sodium (SENOKOT-S) 8.6-50 MG tablet Take 1 tablet by mouth 2 (two) times daily. 30 tablet 0  . SUMAtriptan (IMITREX) 25 MG tablet Take 50 mg by mouth daily as needed for migraine. May repeat in 2 hours if headache persists or recurs.    . traMADol (ULTRAM) 50 MG tablet Take 50 mg by mouth 2 (two) times daily.    . traZODone (DESYREL) 150 MG tablet Take 75 mg by mouth at bedtime.      No current facility-administered medications for this visit.     PAST MEDICAL HISTORY: Past Medical History:  Diagnosis Date  . Acute and chronic respiratory failure, unspecified whether with hypoxia or hypercapnia   . Anemia   . Constipation   . COPD (chronic obstructive pulmonary disease) (HCC)   . Depression   . Diplopia   . Dysphagia   . Esophageal dysmotility   . Hypertension   . Insomnia   . Leukocytosis   . Oral thrush   . PAF (paroxysmal atrial fibrillation) (HCC)   . Physical deconditioning   . Protein calorie malnutrition (HCC)   . Thyroid disease   . Vertigo     PAST SURGICAL HISTORY: Past Surgical History:  Procedure Laterality Date  . ABDOMINAL HYSTERECTOMY    . APPENDECTOMY    . TOTAL HIP ARTHROPLASTY Left   . TOTAL KNEE ARTHROPLASTY Left     FAMILY HISTORY: Family History  Problem Relation Age of Onset  . Hypertension Mother   . Hypertension Son   . Diabetes Neg Hx   . Thyroid disease Neg Hx     SOCIAL HISTORY:  Social History   Social History  . Marital status: Widowed    Spouse name: N/A  . Number of children: 2  . Years of education: HS   Occupational History  . Retired    Social History Main Topics  . Smoking status: Never Smoker  . Smokeless tobacco: Never Used  . Alcohol use No  . Drug use: No  . Sexual activity: Not on file   Other Topics Concern  . Not on file   Social History Narrative   [facility]   Left-handed.   2 cups caffeine per day.      PHYSICAL EXAM   Vitals:   01/31/16 1050  BP: (!) 143/71  Pulse: 66    Not recorded      There is no height or weight on file to calculate BMI.  PHYSICAL EXAMNIATION:  Gen: NAD, conversant, well nourised, obese, well groomed                     Cardiovascular: Regular rate rhythm, no peripheral edema, warm, nontender. Eyes: Conjunctivae clear without exudates or hemorrhage Neck: Supple, no carotid bruits. Pulmonary: Clear to auscultation  bilaterally   NEUROLOGICAL EXAM:  MENTAL STATUS: Speech:    Speech is normal; fluent and spontaneous with normal comprehension.  Cognition:     Orientation to time, place and person     Normal recent and remote memory     Normal Attention span and concentration     Normal Language, naming, repeating,spontaneous speech     Fund of knowledge   CRANIAL NERVES: CN II:   Pupils are round equal and briskly reactive to light. CN III, IV, VI: severe right ptosis, cover right pupil CN V: Facial sensation is intact to pinprick in all 3 divisions bilaterally. Corneal responses are intact.  CN VII: Right eye swelling, erythematous,. CN VIII: Hearing is normal to rubbing fingers CN IX, X: Palate elevates symmetrically. Phonation is normal. CN XI: Head turning and shoulder shrug are intact CN XII: Tongue is midline with normal movements and no atrophy.  MOTOR: There is no pronator drift of out-stretched arms. Muscle bulk and tone are normal. Muscle strength is normal.  REFLEXES: Reflexes are 2+ and symmetric at the biceps, triceps, knees, and ankles. Plantar responses are flexor.  SENSORY: Intact to light touch, pinprick, positional sensation and vibratory sensation are intact in fingers and toes.  COORDINATION: Rapid alternating movements and fine finger movements are intact. There is no dysmetria on finger-to-nose and heel-knee-shin.    GAIT/STANCE: Posture is normal. Gait is steady with normal steps, base, arm swing, and turning.  Heel and toe walking are normal. Tandem gait is normal.  Romberg is absent.   DIAGNOSTIC DATA (LABS, IMAGING, TESTING) - I reviewed patient records, labs, notes, testing and imaging myself where available.   ASSESSMENT AND PLAN  Lorenza Evangelistvelyn Vallez is a 82 y.o. female   Right ptosis, right eye erythematous,  Need to rule out right intrinsic eye disease, Refer her to ophthalmologist   MRI of the right orbit Laboratory evaluations includes Ach R antibody   Levert FeinsteinYijun Kedar Sedano, M.D. Ph.D.  [facility] [address], [address] [place], [place] [address] Ph: [phone] Fax: [phone]  CC: Angela CoxGayani Y Dasanayaka, MD

## 2016-01-31 NOTE — Telephone Encounter (Signed)
Last appointment note faxed to the number listed.

## 2016-02-03 ENCOUNTER — Telehealth: Payer: Self-pay | Admitting: Neurology

## 2016-02-03 LAB — MYASTHENIA GRAVIS FULL PANEL
ACETYLCHOL BLOCK AB: 39 % — AB (ref 0–25)
ACHR BINDING AB, SERUM: 8.3 nmol/L — AB (ref 0.00–0.24)
Anti-striation Abs: 1:160 {titer} — ABNORMAL HIGH

## 2016-02-03 LAB — VITAMIN B12: VITAMIN B 12: 374 pg/mL (ref 211–946)

## 2016-02-03 LAB — C-REACTIVE PROTEIN: CRP: 4.5 mg/L (ref 0.0–4.9)

## 2016-02-03 LAB — CK: CK TOTAL: 21 U/L — AB (ref 24–173)

## 2016-02-03 LAB — FOLATE: Folate: >20 ng/mL (ref >3.0)

## 2016-02-03 LAB — ANA W/REFLEX: Anti Nuclear Antibody(ANA): NEGATIVE

## 2016-02-03 LAB — RPR: RPR Ser Ql: NONREACTIVE

## 2016-02-03 LAB — SEDIMENTATION RATE: SED RATE: 35 mm/h (ref 0–40)

## 2016-02-03 MED ORDER — PYRIDOSTIGMINE BROMIDE 60 MG PO TABS: 60.0000 mg | ORAL_TABLET | Freq: Three times a day (TID) | ORAL | 11 refills | Status: DC

## 2016-02-03 NOTE — Telephone Encounter (Signed)
Find her ophthalmologist record

## 2016-02-03 NOTE — Addendum Note (Signed)
Addended by: Levert FeinsteinYAN, Jametta Moorehead on: 02/03/2016 05:43 PM   Modules accepted: Orders

## 2016-02-03 NOTE — Telephone Encounter (Addendum)
Laboratory evaluation showed positive acetylcholine binding, blocking antibodies, confirming a diagnosis of myasthenia gravis, with her significant right ptosis  I have called in Mestinon 60 mg 3 times a day to her pharmacy,  I called her daughter, she was seen by ophthalmologist Dr. Consuello BossierScott Groate on Feb 02 2016  Paula DusterMichelle, give her a follow up for early next week.

## 2016-02-06 ENCOUNTER — Encounter: Payer: Self-pay | Admitting: Gastroenterology

## 2016-02-06 NOTE — Telephone Encounter (Signed)
Spoke to patient's daughter, Annetta MawJane Ricks (on HIPAA).  She will bring the patient in for a follow up appt on 02/07/16.

## 2016-02-07 ENCOUNTER — Encounter: Payer: Self-pay | Admitting: *Deleted

## 2016-02-07 ENCOUNTER — Encounter: Payer: Self-pay | Admitting: Neurology

## 2016-02-07 ENCOUNTER — Ambulatory Visit (INDEPENDENT_AMBULATORY_CARE_PROVIDER_SITE_OTHER): Payer: Medicare Other | Admitting: Neurology

## 2016-02-07 VITALS — BP 122/68 | HR 68

## 2016-02-07 DIAGNOSIS — I480 Paroxysmal atrial fibrillation: Secondary | ICD-10-CM

## 2016-02-07 DIAGNOSIS — G7000 Myasthenia gravis without (acute) exacerbation: Secondary | ICD-10-CM

## 2016-02-07 MED ORDER — PREDNISONE 10 MG PO TABS: ORAL_TABLET | ORAL | 3 refills | Status: DC

## 2016-02-07 MED ORDER — MYCOPHENOLATE MOFETIL 500 MG PO TABS: 500.0000 mg | ORAL_TABLET | Freq: Two times a day (BID) | ORAL | 11 refills | Status: DC

## 2016-02-07 NOTE — Patient Instructions (Signed)
Take prednisone 10 mg tablets  4 tablet every day after breakfast for 1 week 3 tablets every day for 1 week 2 tablets every day for 1 week Stay on one tablet every day  At the same time add on CellCept 500 mg twice a day Keep Mestinon 60 mg 3 times a day  Keep follow-up appointment on March 01 2016

## 2016-02-07 NOTE — Progress Notes (Signed)
PATIENT: Paula Bird DOB: 1925-12-22  Chief Complaint  Patient presents with  . Myasthenia Gravis    She is here with her daughter, [name], to further discuss lab findings.  Symptoms are unchanged but states she has only had one dose of Mestinon.     HISTORICAL  [name] is 82 year old right-handed female, accompanied by her daughter [name], seen in refer by primary care physician Dr. Merlene Laughter for evaluation of eye pain, right eye swelling, initial evaluation was on January 31 2016.  I reviewed and summarized the referring note, she had a history of hypertension, paroxysmal atrial fibrillation, depression, obsessive-compulsive disorder, acid reflux, polypharmacy treatment, also had a history of left knee, left hip replacement,  She used to lives alone at home, but began to suffer multiple falls around 2015, has moved to [facility] since 2016.  She was noted to have right side droopy eyelid swelling since September 2017, gradually getting worse, light sensitivity, she had chronic headache, tends to stay at the right side, with her new right eye symptoms, she noticed more right behind eye pain, she was seen by optometrist, was given eyedrops of unknown name, with only mild improvement. Is difficulty for her to open her right eye,  She denies rash broke out, she denies hearing loss, no dysarthria, no dysphagia  Laboratory evaluation in September 2017, normal CMP with creatinine 0.61, normal CBC, with hemoglobin of 12.2, TSH was decreased to 0.008 with high free T3 8.3, free thyroxine1.50,  Update February 07 2016: Laboratory evaluation showed positive acetylcholine binding antibody 8.3, blocking antibody 39, anti-striation antibodies 1:160, normal CPK 21, C-reactive protein, ESR, folic acid, vitamin Z61, mild elevated free T3 4.Normal free T4, decreased TSH less than 0.01,  She continue have significant right ptosis, intermittent double vision,  denies swallowing difficulty, gait abnormality she attributed to left hip and knee pain,  REVIEW OF SYSTEMS: Full 14 system review of systems performed and notable only for fatigue, chest pain, achy muscles, headaches, weakness, insomnia, shift work, decreased energy    ALLERGIES: No Known Allergies  HOME MEDICATIONS: Current Outpatient Prescriptions  Medication Sig Dispense Refill  . acetaminophen (TYLENOL) 325 MG tablet Take 650 mg by mouth every 4 (four) hours as needed for mild pain, moderate pain, fever or headache.     Marland Kitchen amiodarone (PACERONE) 200 MG tablet Take 200 mg by mouth daily.     Marland Kitchen amitriptyline (ELAVIL) 75 MG tablet Take 75 mg by mouth at bedtime.  10  . bisacodyl (DULCOLAX) 10 MG suppository Place 10 mg rectally daily as needed for moderate constipation.    . Cholecalciferol (VITAMIN D3) 2000 units TABS Take 1 tablet by mouth daily.    . Cranberry 425 MG CAPS Take 425 mg by mouth 2 (two) times daily.    Marland Kitchen diltiazem (CARDIZEM CD) 180 MG 24 hr capsule Take 180 mg by mouth daily.  10  . diltiazem (CARDIZEM LA) 300 MG 24 hr tablet Take 300 mg by mouth daily.    . famotidine (PEPCID) 20 MG tablet Take 1 tablet (20 mg total) by mouth 2 (two) times daily. 60 tablet 0  . ferrous sulfate 325 (65 FE) MG EC tablet Take 325 mg by mouth daily.    . hydrochlorothiazide (HYDRODIURIL) 25 MG tablet Take 1 tablet (25 mg total) by mouth every Monday, Wednesday, and Friday. 30 tablet 0  . lisinopril (PRINIVIL,ZESTRIL) 10 MG tablet Take 10 mg by mouth daily.    . methimazole (  TAPAZOLE) 10 MG tablet daily.  10  . Multiple Vitamins-Minerals (MULTIVITAMIN GUMMIES ADULT PO) Take 1 tablet by mouth daily.    Marland Kitchen olopatadine (PATANOL) 0.1 % ophthalmic solution Place 1 drop into both eyes daily.  10  . PARoxetine (PAXIL) 40 MG tablet Take 40 mg by mouth daily.  3  . polyethylene glycol (MIRALAX / GLYCOLAX) packet Take 17 g by mouth daily. 14 each 12  . promethazine (PHENERGAN) 25 MG tablet Take 25 mg by  mouth every 6 (six) hours as needed for nausea or vomiting.    . pyridostigmine (MESTINON) 60 MG tablet Take 1 tablet (60 mg total) by mouth 3 (three) times daily. 90 tablet 11  . sennosides-docusate sodium (SENOKOT-S) 8.6-50 MG tablet Take 1 tablet by mouth 2 (two) times daily. 30 tablet 0  . SUMAtriptan (IMITREX) 25 MG tablet Take 50 mg by mouth daily as needed for migraine. May repeat in 2 hours if headache persists or recurs.    . traMADol (ULTRAM) 50 MG tablet Take 50 mg by mouth 2 (two) times daily.    . traZODone (DESYREL) 150 MG tablet Take 75 mg by mouth at bedtime.      No current facility-administered medications for this visit.     PAST MEDICAL HISTORY: Past Medical History:  Diagnosis Date  . Acute and chronic respiratory failure, unspecified whether with hypoxia or hypercapnia   . Afib ([place])   . Allergic rhinitis   . Anemia   . CKD (chronic kidney disease)   . Constipation   . COPD (chronic obstructive pulmonary disease) ([place])   . Depression   . Diplopia   . Dysphagia   . Edema, lower extremity   . Esophageal dysmotility   . Gait instability   . GERD (gastroesophageal reflux disease)   . Hypertension   . Hypothyroidism   . IBS (irritable bowel syndrome)   . IDA (iron deficiency anemia)   . Insomnia   . Leukocytosis   . Oral thrush   . PAF (paroxysmal atrial fibrillation) ([place])   . Physical deconditioning   . Protein calorie malnutrition ([place])   . Thyroid disease   . Thyroid mass   . Vertigo     PAST SURGICAL HISTORY: Past Surgical History:  Procedure Laterality Date  . ABDOMINAL HYSTERECTOMY    . APPENDECTOMY    . TOTAL HIP ARTHROPLASTY Left   . TOTAL KNEE ARTHROPLASTY Left     FAMILY HISTORY: Family History  Problem Relation Age of Onset  . Hypertension Mother   . Hypertension Son   . Diabetes Neg Hx   . Thyroid disease Neg Hx     SOCIAL HISTORY:  Social History   Social History  . Marital status: Widowed    Spouse name: N/A  . Number  of children: 2  . Years of education: HS   Occupational History  . Retired    Social History Main Topics  . Smoking status: Never Smoker  . Smokeless tobacco: Never Used  . Alcohol use No  . Drug use: No  . Sexual activity: Not on file   Other Topics Concern  . Not on file   Social History Narrative   Resides at [facility].   Left-handed.   2 cups caffeine per day.     PHYSICAL EXAM   Vitals:   02/07/16 1312  BP: 122/68  Pulse: 68    Not recorded      There is no height or weight on file to  calculate BMI.  PHYSICAL EXAMNIATION:  Gen: NAD, conversant, well nourised, obese, well groomed                     Cardiovascular: Regular rate rhythm, no peripheral edema, warm, nontender. Eyes: Conjunctivae clear without exudates or hemorrhage Neck: Supple, no carotid bruits. Pulmonary: Clear to auscultation bilaterally   NEUROLOGICAL EXAM:  MENTAL STATUS: Speech:    Speech is normal; fluent and spontaneous with normal comprehension.  Cognition:     Orientation to time, place and person     Normal recent and remote memory     Normal Attention span and concentration     Normal Language, naming, repeating,spontaneous speech     Fund of knowledge   CRANIAL NERVES: CN II:   Pupils are round equal and briskly reactive to light. CN III, IV, VI: severe right ptosis, cover right pupil CN V: Facial sensation is intact to pinprick in all 3 divisions bilaterally. Corneal responses are intact.  CN VII: She has significant right ptosis, cover her right eye, ice packing testing showed mild improvement of eye opening, was able to her right eye but still cover upper edge of right pupil, cover and uncover testing showed mild bilateral exophoria.she has mild eye-closure, cheek puff weakness.   VIII: Hearing is normal to rubbing fingers CN IX, X: Palate elevates symmetrically. Phonation is normal. CN XI: Head turning and shoulder shrug are intact CN XII: Tongue is  midline with normal movements and no atrophy.  MOTOR: She has mild neck flexion, bilateral shoulder abduction, elbow flexion, hip flexion weakness.   REFLEXES: Reflexes are 2+ and symmetric at the biceps, triceps, knees, and ankles. Plantar responses are flexor.  SENSORY: Intact to light touch, pinprick, positional sensation and vibratory sensation are intact in fingers and toes.  COORDINATION: Rapid alternating movements and fine finger movements are intact. There is no dysmetria on finger-to-nose and heel-knee-shin.    GAIT/STANCE:  she needs push up to get up from seated position, dragging her left leg, cautious, unsteady   DIAGNOSTIC DATA (LABS, IMAGING, TESTING) - I reviewed patient records, labs, notes, testing and imaging myself where available.   ASSESSMENT AND PLAN  Yamilett Anastos is a 82 y.o. female   Serum Positive myasthenia gravis, Significant ocular involvement, with mild bulbar and limb muscle weakness  Continue Mestinon 60 mg 3 times a day Add on prednisone starting from 40 mg daily, decrements of 10 mg daily every week, stay at 10 mg every day CellCept 500 mg twice a day steroid sparing agent, Return to clinic on March 01 2016  CT chest without contrast.   Marcial Pacas, M.D. Ph.D.  [facility] [address] [place], [place] [address] Ph: [phone] Fax: [phone]  CC: Merlene Laughter, MD

## 2016-02-08 ENCOUNTER — Telehealth: Payer: Self-pay | Admitting: Neurology

## 2016-02-08 NOTE — Telephone Encounter (Signed)
Cheri/ [employer] called asking if Dr. Terrace ArabiaYan wanted to start a request to start for medication mycophenolate (CELLCEPT) 500 MG tablet. May call [phone]

## 2016-02-08 NOTE — Telephone Encounter (Signed)
Derek/[employer] called to let the office know medication has been approved start date 02/08/16 - 02/07/17. The pt has been notified by them and a letter will be mailed to the office.

## 2016-02-08 NOTE — Telephone Encounter (Signed)
Returned call to [employer] and spoke to The Timken CompanyMyra - states PA reps are in a meeting but she will have someone call me back.  Please interrupt me to take this call.

## 2016-02-08 NOTE — Telephone Encounter (Signed)
Completed PA over the phone with [facility].  They have marked the request "urgent" in order to have it reviewed quicker.

## 2016-02-09 ENCOUNTER — Ambulatory Visit: Payer: Medicare Other | Admitting: Gastroenterology

## 2016-02-23 ENCOUNTER — Ambulatory Visit
Admission: RE | Admit: 2016-02-23 | Discharge: 2016-02-23 | Disposition: A | Discharge status: Home / Self Care | Payer: Medicare Other | Source: Ambulatory Visit | Attending: Neurology | Admitting: Neurology

## 2016-02-23 DIAGNOSIS — H02401 Unspecified ptosis of right eyelid: Secondary | ICD-10-CM

## 2016-02-23 DIAGNOSIS — H5711 Ocular pain, right eye: Secondary | ICD-10-CM | POA: Diagnosis not present

## 2016-02-23 DIAGNOSIS — G7000 Myasthenia gravis without (acute) exacerbation: Secondary | ICD-10-CM

## 2016-02-23 DIAGNOSIS — I480 Paroxysmal atrial fibrillation: Secondary | ICD-10-CM

## 2016-02-23 MED ORDER — GADOBENATE DIMEGLUMINE 529 MG/ML IV SOLN
11.0000 mL | Freq: Once | INTRAVENOUS | Status: AC | PRN
  Administered 2016-02-23: 11 mL via INTRAVENOUS

## 2016-02-27 ENCOUNTER — Telehealth: Payer: Self-pay | Admitting: Neurology

## 2016-02-27 NOTE — Telephone Encounter (Signed)
Left two additional messages, throughout the day, for Paula Bird (dgt on HIPAA) to return my call.  This evening, I was able to reach the pt's son, Paula Bird (HIPAA, POA) - he has not been very involved in his mother's care recently because he is recovering from surgery.  His wife did visit with his mother today and she is still having eye droop.  He would like for the information below to be further discussed with his sister.  He will reach out to her too and ask her to call us.  The patient has a pending appt on 03/01/16.  He says his sister will be bringing her to this appt.  I called again and left another message for [name] this evening.

## 2016-02-27 NOTE — Telephone Encounter (Signed)
Left message for a return call from her daughter, Paula Bird (on HIPAA).

## 2016-02-27 NOTE — Telephone Encounter (Signed)
Please call patient, MRI of the orbit showed some enlarged extraocular muscles, this can be seen in patient with hyperthyroidism, laboratory evaluation last time showed mild hyperthyroidism, if she has not seen her primary care or endocrinologist, I will refer her to an endocrinologist for further evaluation, Also please check on her right ptosis, has her symptoms improved with prednisone  and Mestinon treatment     IMPRESSION:  This MRI of the orbits with and without contrast shows the following: 1.    The rectus muscles are mildly thickened bilaterally with some crowding of the intraconal space but no proptosis noted.   This could be seen with thyroid eye disease or orbital pseudotumor.   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 2.    There are advanced chronic progressive ischemic changes noted in the cerebral hemispheres 3.    Minimal chronic maxillary and ethmoid sinusitis.

## 2016-02-28 ENCOUNTER — Telehealth: Payer: Self-pay | Admitting: *Deleted

## 2016-02-28 ENCOUNTER — Ambulatory Visit
Admission: RE | Admit: 2016-02-28 | Discharge: 2016-02-28 | Disposition: A | Discharge status: Home / Self Care | Payer: Medicare Other | Source: Ambulatory Visit | Attending: Neurology | Admitting: Neurology

## 2016-02-28 NOTE — Telephone Encounter (Signed)
Attempted to return call - left message - have been unable to reach dgt. See other telephone note.  Pt has an appt on 03/01/16 and results can be further discussed at her appt.

## 2016-02-28 NOTE — Telephone Encounter (Signed)
Patient's daughter [name] is returning your call. [name] can be reached at [phone].

## 2016-02-28 NOTE — Telephone Encounter (Signed)
Attempted to return call - left another message - have been unable to reach dgt. See other telephone note.  Pt has an appt on 03/01/16 and results can be further discussed at her appt.

## 2016-03-01 ENCOUNTER — Encounter: Payer: Self-pay | Admitting: Neurology

## 2016-03-01 ENCOUNTER — Ambulatory Visit (INDEPENDENT_AMBULATORY_CARE_PROVIDER_SITE_OTHER): Payer: Medicare Other | Admitting: Neurology

## 2016-03-01 VITALS — BP 130/63 | HR 60 | Ht 62.0 in | Wt 130.0 lb

## 2016-03-01 DIAGNOSIS — G7000 Myasthenia gravis without (acute) exacerbation: Secondary | ICD-10-CM | POA: Diagnosis not present

## 2016-03-01 NOTE — Progress Notes (Signed)
PATIENT: Paula Bird DOB: [birth year]  Chief Complaint  Patient presents with  . R/O Myasthenia Gravis    She is here with her daughter, Opal Sidles and son-in-law, Patrick Jupiter.  Feels only mild improvement of drooping in her eyes, worse on left side, since starting her medications.  They would like to discuss MRI and lab results.      HISTORICAL  Paula Bird is 82 year old right-handed female, accompanied by her daughter [name], seen in refer by primary care physician Dr. Merlene Laughter for evaluation of eye pain, right eye swelling, initial evaluation was on January 31 2016.  I reviewed and summarized the referring note, she had a history of hypertension, paroxysmal atrial fibrillation, depression, obsessive-compulsive disorder, acid reflux, polypharmacy treatment, also had a history of left knee, left hip replacement,  She used to lives alone at home, but began to suffer multiple falls around 2015, has moved to [facility] since 2016.  She was noted to have right side droopy eyelid swelling since September 2017, gradually getting worse, light sensitivity, she had chronic headache, tends to stay at the right side, with her new right eye symptoms, she noticed more right behind eye pain, she was seen by optometrist, was given eyedrops of unknown name, with only mild improvement. Is difficulty for her to open her right eye,  She denies rash broke out, she denies hearing loss, no dysarthria, no dysphagia  Laboratory evaluation in September 2017, normal CMP with creatinine 0.61, normal CBC, with hemoglobin of 12.2, TSH was decreased to 0.008 with high free T3 8.3, free thyroxine1.50,  Update February 07 2016: Laboratory evaluation showed positive acetylcholine binding antibody 8.3, blocking antibody 39, anti-striation antibodies 1:160, normal CPK 21, C-reactive protein, ESR, folic acid, vitamin J68, mild elevated free T3 4.Normal free T4, decreased TSH less than 0.01,  She  continue have significant right ptosis, intermittent double vision, denies swallowing difficulty, gait abnormality she attributed to left hip and knee pain,  UPDATE Dec 7th 2017: She was seen by endocrinologist Dr. 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 Einar Gip showed no significant abnormality  She is able to tolerate prednisone, on tapering dose, CellCept 500 mg twice a day, Mestinon 3 times a day  CT chest showed multiple bilateral lung nodule, no thymus pathology noticed, there was aorta 4 cm aneurysm  REVIEW OF SYSTEMS: Full 14 system review of systems performed and notable only for appetite change, fatigue, facial swelling, hearing loss,   ALLERGIES: No Known Allergies  HOME MEDICATIONS: Current Outpatient Prescriptions  Medication Sig Dispense Refill  . acetaminophen (TYLENOL) 325 MG tablet Take 650 mg by mouth every 4 (four) hours as needed for mild pain, moderate pain, fever or headache.     Marland Kitchen amiodarone (PACERONE) 200 MG tablet Take 200 mg by mouth daily.     Marland Kitchen amitriptyline (ELAVIL) 75 MG tablet Take 75 mg by mouth at bedtime.  10  . bisacodyl (DULCOLAX) 10 MG suppository Place 10 mg rectally daily as needed for moderate constipation.    . Cholecalciferol (VITAMIN D3) 2000 units TABS Take 1 tablet by mouth daily.    . Cranberry 425 MG CAPS Take 425 mg by mouth 2 (two) times daily.    Marland Kitchen diltiazem (CARDIZEM LA) 300 MG 24 hr tablet Take 300 mg by mouth daily.    . famotidine (PEPCID) 20 MG tablet  Take 1 tablet (20 mg total) by mouth 2 (two) times daily. 60 tablet 0  . ferrous sulfate 325 (65 FE) MG EC tablet Take 325 mg by mouth daily.    . hydrochlorothiazide (HYDRODIURIL) 25 MG tablet Take 1 tablet (25 mg total) by mouth every Monday, Wednesday, and Friday. 30 tablet 0  . lisinopril (PRINIVIL,ZESTRIL) 10 MG tablet Take  40 mg by mouth daily.     . methimazole (TAPAZOLE) 10 MG tablet daily.  10  . Multiple Vitamins-Minerals (MULTIVITAMIN GUMMIES ADULT PO) Take 1 tablet by mouth daily.    . mycophenolate (CELLCEPT) 500 MG tablet Take 1 tablet (500 mg total) by mouth 2 (two) times daily. 60 tablet 11  . PARoxetine (PAXIL) 40 MG tablet Take 40 mg by mouth daily.  3  . polyethylene glycol (MIRALAX / GLYCOLAX) packet Take 17 g by mouth daily. 14 each 12  . predniSONE (DELTASONE) 10 MG tablet Take as instructed. 90 tablet 3  . promethazine (PHENERGAN) 25 MG tablet Take 25 mg by mouth every 6 (six) hours as needed for nausea or vomiting.    . pyridostigmine (MESTINON) 60 MG tablet Take 1 tablet (60 mg total) by mouth 3 (three) times daily. 90 tablet 11  . sennosides-docusate sodium (SENOKOT-S) 8.6-50 MG tablet Take 1 tablet by mouth 2 (two) times daily. 30 tablet 0  . SUMAtriptan (IMITREX) 25 MG tablet Take 50 mg by mouth daily as needed for migraine. May repeat in 2 hours if headache persists or recurs.    . traMADol (ULTRAM) 50 MG tablet Take 50 mg by mouth 2 (two) times daily.    . traZODone (DESYREL) 150 MG tablet Take 75 mg by mouth at bedtime.      No current facility-administered medications for this visit.     PAST MEDICAL HISTORY: Past Medical History:  Diagnosis Date  . Acute and chronic respiratory failure, unspecified whether with hypoxia or hypercapnia   . Afib ([place])   . Allergic rhinitis   . Anemia   . CKD (chronic kidney disease)   . Constipation   . COPD (chronic obstructive pulmonary disease) ([place])   . Depression   . Diplopia   . Dysphagia   . Edema, lower extremity   . Esophageal dysmotility   . Gait instability   . GERD (gastroesophageal reflux disease)   . Hypertension   . Hypothyroidism   . IBS (irritable bowel syndrome)   . IDA (iron deficiency anemia)   . Insomnia   . Leukocytosis   . Oral thrush   . PAF (paroxysmal atrial fibrillation) ([place])   . Physical deconditioning   .  Protein calorie malnutrition ([place])   . Thyroid disease   . Thyroid mass   . Vertigo     PAST SURGICAL HISTORY: Past Surgical History:  Procedure Laterality Date  . ABDOMINAL HYSTERECTOMY    . APPENDECTOMY    . TOTAL HIP ARTHROPLASTY Left   . TOTAL KNEE ARTHROPLASTY Left     FAMILY HISTORY: Family History  Problem Relation Age of Onset  . Hypertension Mother   . Hypertension Son   . Diabetes Neg Hx   . Thyroid disease Neg Hx     SOCIAL HISTORY:  Social History   Social History  . Marital status: Widowed    Spouse name: N/A  . Number of children: 2  . Years of education: HS   Occupational History  . Retired    Social History Main Topics  . Smoking status: Never Smoker  .  Smokeless tobacco: Never Used  . Alcohol use No  . Drug use: No  . Sexual activity: Not on file   Other Topics Concern  . Not on file   Social History Narrative   Resides at [facility].   Left-handed.   2 cups caffeine per day.     PHYSICAL EXAM   Vitals:   03/01/16 1016  BP: 130/63  Pulse: 60  Weight: 130 lb (59 kg)  Height: _0  (1.575 m)    Not recorded      Body mass index is 23.78 kg/m.  PHYSICAL EXAMNIATION:  Gen: NAD, conversant, well nourised, obese, well groomed                     Cardiovascular: Regular rate rhythm, no peripheral edema, warm, nontender. Eyes: Conjunctivae clear without exudates or hemorrhage Neck: Supple, no carotid bruits. Pulmonary: Clear to auscultation bilaterally   NEUROLOGICAL EXAM:  MENTAL STATUS: Speech:    Speech is normal; fluent and spontaneous with normal comprehension.  Cognition:     Orientation to time, place and person     Normal recent and remote memory     Normal Attention span and concentration     Normal Language, naming, repeating,spontaneous speech     Fund of knowledge   CRANIAL NERVES: CN II:   Pupils are round equal and briskly reactive to light. CN III, IV, VI: Intermittent fatigable right  ptosis, cover right pupil, she has mild bilateral eye-closure, cheek puff weakness. CN V: Facial sensation is intact to pinprick in all 3 divisions bilaterally. Corneal responses are intact.  CN VII:  cover and uncover testing showed mild bilateral exophoria.she has mild eye-closure, cheek puff weakness.   VIII: Hearing is normal to rubbing fingers CN IX, X: Palate elevates symmetrically. Phonation is normal. CN XI: Head turning and shoulder shrug are intact CN XII: Tongue is midline with normal movements and no atrophy.  MOTOR: She has mild neck flexion, bilateral shoulder abduction, elbow flexion, hip flexion weakness.   REFLEXES: Reflexes are 2+ and symmetric at the biceps, triceps, knees, and ankles. Plantar responses are flexor.  SENSORY: Intact to light touch, pinprick, positional sensation and vibratory sensation are intact in fingers and toes.  COORDINATION: Rapid alternating movements and fine finger movements are intact. There is no dysmetria on finger-to-nose and heel-knee-shin.    GAIT/STANCE:  she needs push up to get up from seated position, dragging her left leg, cautious, unsteady   DIAGNOSTIC DATA (LABS, IMAGING, TESTING) - I reviewed patient records, labs, notes, testing and imaging myself where available.   ASSESSMENT AND PLAN  Daneka Lantigua is a 82 y.o. female   Serum Positive myasthenia gravis, Significant ocular involvement, with mild bulbar and limb muscle weakness  Continue Mestinon 60 mg 3 times a day Continue prednisone tapering, maintaining at 10 mg every day, CellCept 500 mg twice a day steroid sparing agent,   Marcial Pacas, M.D. Ph.D.  [facility] [address] [place], [place] [address] Ph: [phone] Fax: [phone]  CC: Merlene Laughter, MD

## 2016-03-14 ENCOUNTER — Emergency Department (HOSPITAL_COMMUNITY): Payer: Medicare Other

## 2016-03-14 ENCOUNTER — Emergency Department (HOSPITAL_COMMUNITY)
Admission: EM | Admit: 2016-03-14 | Discharge: 2016-03-14 | Disposition: A | Discharge status: Home / Self Care | Payer: Medicare Other | Attending: Emergency Medicine | Admitting: Emergency Medicine

## 2016-03-14 ENCOUNTER — Encounter (HOSPITAL_COMMUNITY): Payer: Self-pay | Admitting: *Deleted

## 2016-03-14 DIAGNOSIS — Z79899 Other long term (current) drug therapy: Secondary | ICD-10-CM | POA: Diagnosis not present

## 2016-03-14 DIAGNOSIS — I129 Hypertensive chronic kidney disease with stage 1 through stage 4 chronic kidney disease, or unspecified chronic kidney disease: Secondary | ICD-10-CM | POA: Insufficient documentation

## 2016-03-14 DIAGNOSIS — Z96642 Presence of left artificial hip joint: Secondary | ICD-10-CM | POA: Insufficient documentation

## 2016-03-14 DIAGNOSIS — Y929 Unspecified place or not applicable: Secondary | ICD-10-CM | POA: Diagnosis not present

## 2016-03-14 DIAGNOSIS — Y999 Unspecified external cause status: Secondary | ICD-10-CM | POA: Diagnosis not present

## 2016-03-14 DIAGNOSIS — W19XXXA Unspecified fall, initial encounter: Secondary | ICD-10-CM

## 2016-03-14 DIAGNOSIS — Y939 Activity, unspecified: Secondary | ICD-10-CM | POA: Diagnosis not present

## 2016-03-14 DIAGNOSIS — J449 Chronic obstructive pulmonary disease, unspecified: Secondary | ICD-10-CM | POA: Diagnosis not present

## 2016-03-14 DIAGNOSIS — M546 Pain in thoracic spine: Secondary | ICD-10-CM | POA: Diagnosis present

## 2016-03-14 DIAGNOSIS — R51 Headache: Secondary | ICD-10-CM | POA: Insufficient documentation

## 2016-03-14 DIAGNOSIS — Z96652 Presence of left artificial knee joint: Secondary | ICD-10-CM | POA: Insufficient documentation

## 2016-03-14 DIAGNOSIS — W050XXA Fall from non-moving wheelchair, initial encounter: Secondary | ICD-10-CM | POA: Insufficient documentation

## 2016-03-14 DIAGNOSIS — E039 Hypothyroidism, unspecified: Secondary | ICD-10-CM | POA: Diagnosis not present

## 2016-03-14 DIAGNOSIS — T1490XA Injury, unspecified, initial encounter: Secondary | ICD-10-CM

## 2016-03-14 DIAGNOSIS — N189 Chronic kidney disease, unspecified: Secondary | ICD-10-CM | POA: Insufficient documentation

## 2016-03-14 MED ORDER — HYDROCODONE-ACETAMINOPHEN 5-325 MG PO TABS
1.0000 | ORAL_TABLET | Freq: Once | ORAL | Status: AC
  Administered 2016-03-14: 1 via ORAL
  Filled 2016-03-14: qty 1

## 2016-03-14 NOTE — ED Notes (Signed)
[facility] Expected date:  Expected time:  Means of arrival:  Comments: EMS- 82yo F, fall

## 2016-03-14 NOTE — ED Provider Notes (Signed)
[facility]-EMERGENCY DEPT Provider Note   CSN: [id number] Arrival date & time: 03/14/16  1327     History   Chief Complaint Chief Complaint  Patient presents with  . Fall    HPI Paula Bird is a 82 y.o. female.  82 year old female presents complaining of midthoracic back pain as well as occipital head pain and left-sided facial pain after she fell while trying to self transfer from her wheelchair to the bed. No loss of consciousness. Denies any confusion or vomiting. Denies any pain from the waist down. No chest or abdominal discomfort. Pain characterized as sharp and worse with any movement of her neck. Denies any numbness or tingling to her arms or legs. Also complains of chronic right eye pain times several years which is unchanged and without redness or drainage. Denies any headaches. EMS called and patient transported here      Past Medical History:  Diagnosis Date  . Acute and chronic respiratory failure, unspecified whether with hypoxia or hypercapnia   . Afib (HCC)   . Allergic rhinitis   . Anemia   . CKD (chronic kidney disease)   . Constipation   . COPD (chronic obstructive pulmonary disease) (HCC)   . Depression   . Diplopia   . Dysphagia   . Edema, lower extremity   . Esophageal dysmotility   . Gait instability   . GERD (gastroesophageal reflux disease)   . Hypertension   . Hypothyroidism   . IBS (irritable bowel syndrome)   . IDA (iron deficiency anemia)   . Insomnia   . Leukocytosis   . Oral thrush   . PAF (paroxysmal atrial fibrillation) (HCC)   . Physical deconditioning   . Protein calorie malnutrition (HCC)   . Thyroid disease   . Thyroid mass   . Vertigo     Patient Active Problem List   Diagnosis Date Noted  . Myasthenia gravis (HCC) 02/07/2016  . Ptosis of eyelid, right 01/31/2016  . Pain, eye, right 01/31/2016  . Falling 01/31/2016  . Vertigo 10/08/2015  . Colon polyp 08/30/2015  . Arthritis, degenerative 08/30/2015  . Acid  indigestion 08/30/2015  . Psoriasis 08/30/2015  . Avitaminosis D 08/30/2015  . HCAP (healthcare-associated pneumonia) 08/08/2015  . Respiratory distress 08/08/2015  . Essential hypertension 08/08/2015  . COPD exacerbation (HCC) 08/08/2015  . Dyspnea 08/04/2015  . Hypertension 08/04/2015  . Depression 08/04/2015  . Insomnia 08/04/2015  . Obstructive chronic bronchitis with acute exacerbation (HCC) 08/04/2015  . Thrombocytopenia (HCC) 08/04/2015  . Paroxysmal atrial fibrillation (HCC) 08/04/2015  . Acute exacerbation of chronic obstructive bronchitis (HCC) 08/04/2015  . Hypothyroidism 08/04/2015  . Infection of urinary tract 07/01/2014  . Bradycardia 11/24/2013  . Fall in home 11/22/2013  . Pulmonary hypertension 11/19/2013  . Excessive falling 09/07/2012  . Encounter for general adult medical examination without abnormal findings 03/24/2012  . Atrial paroxysmal tachycardia (HCC) 10/05/2011  . Family history of colon cancer 05/15/2011  . Mass of pelvis 05/15/2011    Past Surgical History:  Procedure Laterality Date  . ABDOMINAL HYSTERECTOMY    . APPENDECTOMY    . TOTAL HIP ARTHROPLASTY Left   . TOTAL KNEE ARTHROPLASTY Left     OB History    No data available       Home Medications    Prior to Admission medications   Medication Sig Start Date End Date Taking? Authorizing Provider  acetaminophen (TYLENOL) 325 MG tablet Take 650 mg by mouth every 4 (four) hours as needed for mild pain,  moderate pain, fever or headache.    Yes Historical Provider, MD  amiodarone (PACERONE) 200 MG tablet Take 200 mg by mouth daily.    Yes Historical Provider, MD  amitriptyline (ELAVIL) 75 MG tablet Take 75 mg by mouth at bedtime. 07/12/15  Yes Historical Provider, MD  azithromycin (ZITHROMAX) 500 MG tablet Take 500 mg by mouth daily. Started 12/20 x 7 days 03/13/16  Yes Historical Provider, MD  bisacodyl (DULCOLAX) 10 MG suppository Place 10 mg rectally daily as needed for moderate  constipation.   Yes Historical Provider, MD  Cholecalciferol (VITAMIN D3) 2000 units TABS Take 1 tablet by mouth daily.   Yes Historical Provider, MD  Cranberry 425 MG CAPS Take 425 mg by mouth 2 (two) times daily.   Yes Historical Provider, MD  diltiazem (CARDIZEM CD) 300 MG 24 hr capsule Take 300 mg by mouth daily. 03/04/16  Yes Historical Provider, MD  famotidine (PEPCID) 20 MG tablet Take 1 tablet (20 mg total) by mouth 2 (two) times daily. 08/16/15  Yes [name], MD  ferrous sulfate 325 (65 FE) MG EC tablet Take 325 mg by mouth daily.   Yes Historical Provider, MD  hydrochlorothiazide (HYDRODIURIL) 25 MG tablet Take 1 tablet (25 mg total) by mouth every Monday, Wednesday, and Friday. 08/16/15  Yes [name], MD  lisinopril (PRINIVIL,ZESTRIL) 40 MG tablet Take 40 mg by mouth daily. 03/04/16  Yes Historical Provider, MD  methimazole (TAPAZOLE) 10 MG tablet Take 15 mg by mouth daily.  02/02/16  Yes Historical Provider, MD  Multiple Vitamins-Minerals (MULTIVITAMIN GUMMIES ADULT PO) Take 1 tablet by mouth daily.   Yes Historical Provider, MD  mycophenolate (CELLCEPT) 500 MG tablet Take 1 tablet (500 mg total) by mouth 2 (two) times daily. 02/07/16  Yes Levert Feinstein, MD  PARoxetine (PAXIL) 40 MG tablet Take 40 mg by mouth daily. 07/23/15  Yes Historical Provider, MD  polyethylene glycol (MIRALAX / GLYCOLAX) packet Take 17 g by mouth daily. Patient taking differently: Take 17 g by mouth 2 (two) times daily.  08/27/14  Yes Arthor Captain, PA-C  promethazine (PHENERGAN) 25 MG tablet Take 25 mg by mouth every 6 (six) hours as needed for nausea or vomiting.   Yes Historical Provider, MD  pyridostigmine (MESTINON) 60 MG tablet Take 1 tablet (60 mg total) by mouth 3 (three) times daily. 02/03/16  Yes Levert Feinstein, MD  saccharomyces boulardii (FLORASTOR) 250 MG capsule Take 250 mg by mouth 2 (two) times daily. Started 12/20, stop 12/29   Yes Historical Provider, MD  sennosides-docusate sodium (SENOKOT-S) 8.6-50 MG tablet  Take 1 tablet by mouth 2 (two) times daily. 08/16/15  Yes [name], MD  SUMAtriptan (IMITREX) 25 MG tablet Take 50 mg by mouth daily as needed for migraine. May repeat in 2 hours if headache persists or recurs.   Yes Historical Provider, MD  traZODone (DESYREL) 150 MG tablet Take 75 mg by mouth at bedtime.    Yes Historical Provider, MD  predniSONE (DELTASONE) 10 MG tablet Take as instructed. Patient not taking: Reported on 03/14/2016 02/07/16   Levert Feinstein, MD    Family History Family History  Problem Relation Age of Onset  . Hypertension Mother   . Hypertension Son   . Diabetes Neg Hx   . Thyroid disease Neg Hx     Social History Social History  Substance Use Topics  . Smoking status: Never Smoker  . Smokeless tobacco: Never Used  . Alcohol use No     Allergies  Patient has no known allergies.   Review of Systems Review of Systems  All other systems reviewed and are negative.    Physical Exam Updated Vital Signs BP 159/68 (BP Location: Right Arm)   Pulse 70   Temp 98.1 F (36.7 C) (Oral)   Resp 16   Ht 5\' 2"  (1.575 m)   Wt 59 kg   SpO2 95%   BMI 23.78 kg/m   Physical Exam  Constitutional: She is oriented to person, place, and time. She appears well-developed and well-nourished.  Non-toxic appearance. No distress.  HENT:  Head: Normocephalic and atraumatic.  Eyes: Conjunctivae, EOM and lids are normal. Pupils are equal, round, and reactive to light.  Neck: Normal range of motion. Neck supple. No tracheal deviation present. No thyroid mass present.    Cardiovascular: Normal rate, regular rhythm and normal heart sounds.  Exam reveals no gallop.   No murmur heard. Pulmonary/Chest: Effort normal and breath sounds normal. No stridor. No respiratory distress. She has no decreased breath sounds. She has no wheezes. She has no rhonchi. She has no rales.  Abdominal: Soft. Normal appearance and bowel sounds are normal. She exhibits no distension. There is no tenderness.  There is no rebound and no CVA tenderness.  Musculoskeletal: Normal range of motion. She exhibits no edema or tenderness.       Back:  Neurological: She is alert and oriented to person, place, and time. She has normal strength. No cranial nerve deficit or sensory deficit. GCS eye subscore is 4. GCS verbal subscore is 5. GCS motor subscore is 6.  Skin: Skin is warm and dry. No abrasion and no rash noted.  Psychiatric: She has a normal mood and affect. Her speech is normal and behavior is normal.  Nursing note and vitals reviewed.    ED Treatments / Results  Labs (all labs ordered are listed, but only abnormal results are displayed) Labs Reviewed - No data to display  EKG  EKG Interpretation None       Radiology No results found.  Procedures Procedures (including critical care time)  Medications Ordered in ED Medications  HYDROcodone-acetaminophen (NORCO/VICODIN) 5-325 MG per tablet 1 tablet (not administered)     Initial Impression / Assessment and Plan / ED Course  I have reviewed the triage vital signs and the nursing notes.  Pertinent labs & imaging results that were available during my care of the patient were reviewed by me and considered in my medical decision making (see chart for details).  Clinical Course     X-rays are negative and patient stable for discharge  Final Clinical Impressions(s) / ED Diagnoses   Final diagnoses:  None    New Prescriptions New Prescriptions   No medications on file     Lorre NickAnthony Alyzza Andringa, MD 03/14/16 1713

## 2016-03-14 NOTE — ED Notes (Signed)
[facility] discharged [facility]

## 2016-03-14 NOTE — ED Triage Notes (Signed)
Patient is brought by EMS from [facility].  Patient was eating lunch and was transfering from dinning room chair and tranfering to wheelchair and fell backward.  Patient hit her head without loss of consciousness.  No bleeding deformity noted.  Patient not taking any blood thinner.

## 2016-03-14 NOTE — ED Notes (Signed)
Called [facility] for transport.  

## 2016-03-14 NOTE — ED Notes (Signed)
Attempted to call report to facility x1 

## 2016-04-05 ENCOUNTER — Encounter: Payer: Self-pay | Admitting: Gastroenterology

## 2016-04-05 ENCOUNTER — Encounter (INDEPENDENT_AMBULATORY_CARE_PROVIDER_SITE_OTHER): Payer: Self-pay

## 2016-04-05 ENCOUNTER — Ambulatory Visit (INDEPENDENT_AMBULATORY_CARE_PROVIDER_SITE_OTHER): Payer: Medicare Other | Admitting: Gastroenterology

## 2016-04-05 VITALS — BP 140/74 | HR 72 | Ht 62.0 in | Wt 134.8 lb

## 2016-04-05 DIAGNOSIS — I480 Paroxysmal atrial fibrillation: Secondary | ICD-10-CM

## 2016-04-05 DIAGNOSIS — G7000 Myasthenia gravis without (acute) exacerbation: Secondary | ICD-10-CM | POA: Diagnosis not present

## 2016-04-05 DIAGNOSIS — R1314 Dysphagia, pharyngoesophageal phase: Secondary | ICD-10-CM

## 2016-04-05 NOTE — Progress Notes (Signed)
[facility] Consult Note:  History: Paula Bird 04/05/2016  Referring physician: Angela Cox, MD  Reason for consult/chief complaint: Dysphagia (Food lodged in throat, very painful; she also gets strangled with liquids) and Gastroesophageal Reflux (Patient cannot afford PPI)   Subjective  HPI:  This is an 83 year old woman referred by a nursing home physician for iron deficiency anemia. Limited records are available that included an office note from his physician in late August 2017, a medical administration record from the nursing facility, and some limited labs. The patient is with her husband today, and I'm afraid she is a very poor historian. She is complaining of years of chronic dysphagia where solid food will feel lodged in the throat or lower chest and it is quite painful. Sometimes liquids will make her feel "strangled". She felt that it was better years before when she was on PPI. She is debilitated and confined to a wheelchair or bed. He reports that she does not like the food at the nursing facility, and does not feel she has enough time for meals. It sounds like she sometimes feels food regurgitate late in the evening, and does tend to snack with food that she has in her room.  ROS:  Review of Systems  Constitutional: Negative for appetite change and unexpected weight change.  HENT: Negative for mouth sores and voice change.   Eyes: Negative for pain and redness.  Respiratory: Negative for cough and shortness of breath.   Cardiovascular: Negative for chest pain and palpitations.  Genitourinary: Negative for dysuria and hematuria.  Musculoskeletal: Negative for arthralgias and myalgias.  Skin: Negative for pallor and rash.  Neurological: Positive for headaches. Negative for weakness.  Hematological: Negative for adenopathy.  Psychiatric/Behavioral: Positive for dysphoric mood. The patient is nervous/anxious.        She reports "I just feel mad all  the time"     Past Medical History: Past Medical History:  Diagnosis Date  . Acute and chronic respiratory failure, unspecified whether with hypoxia or hypercapnia   . Afib (HCC)   . Allergic rhinitis   . Anemia   . CKD (chronic kidney disease)   . Constipation   . COPD (chronic obstructive pulmonary disease) (HCC)   . Depression   . Diplopia   . Dysphagia   . Edema, lower extremity   . Esophageal dysmotility   . Gait instability   . GERD (gastroesophageal reflux disease)   . Hypertension   . Hypothyroidism   . IBS (irritable bowel syndrome)   . IDA (iron deficiency anemia)   . Insomnia   . Leukocytosis   . Myasthenia gravis (HCC)   . Oral thrush   . PAF (paroxysmal atrial fibrillation) (HCC)   . Physical deconditioning   . Protein calorie malnutrition (HCC)   . Thyroid disease   . Thyroid mass   . Vertigo      Past Surgical History: Past Surgical History:  Procedure Laterality Date  . ABDOMINAL HYSTERECTOMY    . APPENDECTOMY    . TOTAL HIP ARTHROPLASTY Left   . TOTAL KNEE ARTHROPLASTY Left      Family History: Family History  Problem Relation Age of Onset  . Hypertension Mother   . Hypertension Son   . Diabetes Neg Hx   . Thyroid disease Neg Hx     Social History: Social History   Social History  . Marital status: Widowed    Spouse name: N/A  . Number of children: 2  . Years of  education: HS   Occupational History  . Retired    Social History Main Topics  . Smoking status: Never Smoker  . Smokeless tobacco: Never Used  . Alcohol use No  . Drug use: No  . Sexual activity: Not Asked   Other Topics Concern  . None   Social History Narrative   [facility].   Left-handed.   2 cups caffeine per day.    Allergies: No Known Allergies  Outpatient Meds: Current Outpatient Prescriptions  Medication Sig Dispense Refill  . acetaminophen (TYLENOL) 325 MG tablet Take 650 mg by mouth every 4 (four) hours as needed for  mild pain, moderate pain, fever or headache.     Marland Kitchen. amiodarone (PACERONE) 200 MG tablet Take 200 mg by mouth daily.     Marland Kitchen. amitriptyline (ELAVIL) 75 MG tablet Take 75 mg by mouth at bedtime.  10  . bisacodyl (DULCOLAX) 10 MG suppository Place 10 mg rectally daily as needed for moderate constipation.    . Cholecalciferol (VITAMIN D3) 2000 units TABS Take 1 tablet by mouth daily.    . Cranberry 425 MG CAPS Take 425 mg by mouth 2 (two) times daily.    Marland Kitchen. diltiazem (CARDIZEM CD) 300 MG 24 hr capsule Take 300 mg by mouth daily.  10  . famotidine (PEPCID) 20 MG tablet Take 1 tablet (20 mg total) by mouth 2 (two) times daily. 60 tablet 0  . ferrous sulfate 325 (65 FE) MG EC tablet Take 325 mg by mouth daily.    . hydrochlorothiazide (HYDRODIURIL) 25 MG tablet Take 1 tablet (25 mg total) by mouth every Monday, Wednesday, and Friday. 30 tablet 0  . lisinopril (PRINIVIL,ZESTRIL) 40 MG tablet Take 40 mg by mouth daily.  10  . methimazole (TAPAZOLE) 10 MG tablet Take 15 mg by mouth daily.   10  . Multiple Vitamins-Minerals (MULTIVITAMIN GUMMIES ADULT PO) Take 1 tablet by mouth daily.    . mycophenolate (CELLCEPT) 500 MG tablet Take 1 tablet (500 mg total) by mouth 2 (two) times daily. 60 tablet 11  . PARoxetine (PAXIL) 40 MG tablet Take 40 mg by mouth daily.  3  . polyethylene glycol (MIRALAX / GLYCOLAX) packet Take 17 g by mouth daily. (Patient taking differently: Take 17 g by mouth 2 (two) times daily. ) 14 each 12  . promethazine (PHENERGAN) 25 MG tablet Take 25 mg by mouth every 6 (six) hours as needed for nausea or vomiting.    . pyridostigmine (MESTINON) 60 MG tablet Take 1 tablet (60 mg total) by mouth 3 (three) times daily. 90 tablet 11  . sennosides-docusate sodium (SENOKOT-S) 8.6-50 MG tablet Take 1 tablet by mouth 2 (two) times daily. 30 tablet 0  . SUMAtriptan (IMITREX) 25 MG tablet Take 50 mg by mouth daily as needed for migraine. May repeat in 2 hours if headache persists or recurs.    . traZODone  (DESYREL) 150 MG tablet Take 75 mg by mouth at bedtime.      No current facility-administered medications for this visit.       ___________________________________________________________________ Objective   Exam:  BP 140/74   Pulse 72   Ht 5\' 2"  (1.575 m)   Wt 134 lb 12.8 oz (61.1 kg)   BMI 24.66 kg/m    General: this is a(n) Anxious appearing, feeble elderly woman confined to a wheelchair, which limits her evaluation.   Eyes: sclera anicteric, no redness. No lid lag  ENT: oral mucosa moist without lesions, no  cervical or supraclavicular lymphadenopathy, good dentition. Not well visualized, she was reluctant to comply with some portions of the exam.  CV: RRR without murmur, S1/S2, no JVD, no peripheral edema  Resp: clear to auscultation bilaterally, normal RR and effort noted  GI: soft, no tenderness, with active bowel sounds. No guarding or palpable organomegaly noted.  Skin; warm and dry, no rash or jaundice noted  Neuro: awake, alert and oriented x 3. Normal gross motor function and fluent speech  Labs:  Iron 27, TIBC 370 for saturation of 7%. No CBC is available  vitamin B12 454  Radiologic Studies: Modified barium study in 2016 was normal  Barium study May 2017 showed severe dysmotility, no stricture, and the 13 mm barium tablet passed.  Assessment: Encounter Diagnoses  Name Primary?  . Pharyngoesophageal dysphagia   . Myasthenia gravis (HCC) Yes  . Paroxysmal atrial fibrillation (HCC)     I do not know what to make of this patient's reported anemia, since no CBC accompanied her consult. She has low iron levels. Given her age and debility, I have no intention of forming endoscopic workup for this. Her dysphagia is clearly an age-related motility disorder (presbyesophagus), there does not appear to be any focal mechanical stricture amenable to endoscopic dilation. She needs dietary modifications which I wrote in recommendations to go back to her facility.  Essentially, she needs to be on ground meat and chopped vegetables, she needs at least 20 minutes for a meal and something to drink after every couple of bites. He still has difficulty with that, the consistency should be decreased to pure. The recently diagnosed myasthenia gravis is also probably contributing to her dysphagia insofar as it may cause difficulty chewing and swallowing.  Total time 45 minutes, over half spent reviewing available records and in long discussion with her and her son.  Thank you for the courtesy of this consult.  Please call me with any questions or concerns.  Charlie Pitter III  CC: Angela Cox, MD

## 2016-04-05 NOTE — Patient Instructions (Addendum)
To [facility] staff:  This patient's dysphagia is from an esophageal motility disorder, not amenable to medicines or endoscopic intervention.  Please do the following:  Obtain the assistance of a dietician to design a diet where all meat is ground, all vegetables must be chopped fine.  Something to drink after every few bites.  Mealtime will probably take her at least 20 minutes.  Do not lay down for at least 2 hours after meals.  No endoscopic workup is planned for reported iron deficiency.  Continue twice daily pepcid for acid suppression.  If you are age 81 or older, your body mass index should be between 23-30. Your Body mass index is 24.66 kg/m. If this is out of the aforementioned range listed, please consider follow up with your Primary Care Provider.  If you are age 81 or younger, your body mass index should be between 19-25. Your Body mass index is 24.66 kg/m. If this is out of the aformentioned range listed, please consider follow up with your Primary Care Provider.   Thank you for choosing [facility]  Dr Amada JupiterHenry Danis III

## 2016-04-24 ENCOUNTER — Ambulatory Visit (INDEPENDENT_AMBULATORY_CARE_PROVIDER_SITE_OTHER): Payer: Medicare Other | Admitting: Endocrinology

## 2016-04-24 ENCOUNTER — Encounter: Payer: Self-pay | Admitting: Endocrinology

## 2016-04-24 DIAGNOSIS — E0590 Thyrotoxicosis, unspecified without thyrotoxic crisis or storm: Secondary | ICD-10-CM | POA: Diagnosis not present

## 2016-04-24 LAB — T4, FREE: Free T4: 0.34 ng/dL — ABNORMAL LOW (ref 0.60–1.60)

## 2016-04-24 LAB — TSH: TSH: 7.37 u[IU]/mL — ABNORMAL HIGH (ref 0.35–4.50)

## 2016-04-24 MED ORDER — METHIMAZOLE 5 MG PO TABS: 5.0000 mg | ORAL_TABLET | Freq: Three times a day (TID) | ORAL | 11 refills | Status: DC

## 2016-04-24 NOTE — Progress Notes (Signed)
Subjective:    Patient ID: Paula Bird, female    DOB: Oct 01, 1925, 83 y.o.   MRN: [id number]  HPI Pt returns for f/u of autoimmune thyroid dz (dx'ed 2016; characterized by alternating hyper- and hypothyroidism; she was rx'ed tapazole in 2016, for a brief time, but this was changed to synthroid in early 2017; this was stopped in Aug of 2017, for a suppressed TSH, and tapazole was started; dtr provides hx, as pt has memory loss.  dtr says pt feels well in general.   Past Medical History:  Diagnosis Date  . Acute and chronic respiratory failure, unspecified whether with hypoxia or hypercapnia   . Afib (HCC)   . Allergic rhinitis   . Anemia   . CKD (chronic kidney disease)   . Constipation   . COPD (chronic obstructive pulmonary disease) (HCC)   . Depression   . Diplopia   . Dysphagia   . Edema, lower extremity   . Esophageal dysmotility   . Gait instability   . GERD (gastroesophageal reflux disease)   . Hypertension   . Hypothyroidism   . IBS (irritable bowel syndrome)   . IDA (iron deficiency anemia)   . Insomnia   . Leukocytosis   . Myasthenia gravis (HCC)   . Oral thrush   . PAF (paroxysmal atrial fibrillation) (HCC)   . Physical deconditioning   . Protein calorie malnutrition (HCC)   . Thyroid disease   . Thyroid mass   . Vertigo     Past Surgical History:  Procedure Laterality Date  . ABDOMINAL HYSTERECTOMY    . APPENDECTOMY    . TOTAL HIP ARTHROPLASTY Left   . TOTAL KNEE ARTHROPLASTY Left     Social History   Social History  . Marital status: Widowed    Spouse name: N/A  . Number of children: 2  . Years of education: HS   Occupational History  . Retired    Social History Main Topics  . Smoking status: Never Smoker  . Smokeless tobacco: Never Used  . Alcohol use No  . Drug use: No  . Sexual activity: Not on file   Other Topics Concern  . Not on file   Social History Narrative   [facility].   Left-handed.   2 cups  caffeine per day.    Current Outpatient Prescriptions on File Prior to Visit  Medication Sig Dispense Refill  . acetaminophen (TYLENOL) 325 MG tablet Take 650 mg by mouth every 4 (four) hours as needed for mild pain, moderate pain, fever or headache.     Marland Kitchen. amiodarone (PACERONE) 200 MG tablet Take 200 mg by mouth daily.     Marland Kitchen. amitriptyline (ELAVIL) 75 MG tablet Take 75 mg by mouth at bedtime.  10  . bisacodyl (DULCOLAX) 10 MG suppository Place 10 mg rectally daily as needed for moderate constipation.    . Cholecalciferol (VITAMIN D3) 2000 units TABS Take 1 tablet by mouth daily.    . Cranberry 425 MG CAPS Take 425 mg by mouth 2 (two) times daily.    Marland Kitchen. diltiazem (CARDIZEM CD) 300 MG 24 hr capsule Take 300 mg by mouth daily.  10  . famotidine (PEPCID) 20 MG tablet Take 1 tablet (20 mg total) by mouth 2 (two) times daily. 60 tablet 0  . ferrous sulfate 325 (65 FE) MG EC tablet Take 325 mg by mouth daily.    . hydrochlorothiazide (HYDRODIURIL) 25 MG tablet Take 1 tablet (25 mg total) by mouth  every Monday, Wednesday, and Friday. 30 tablet 0  . lisinopril (PRINIVIL,ZESTRIL) 40 MG tablet Take 40 mg by mouth daily.  10  . Multiple Vitamins-Minerals (MULTIVITAMIN GUMMIES ADULT PO) Take 1 tablet by mouth daily.    . mycophenolate (CELLCEPT) 500 MG tablet Take 1 tablet (500 mg total) by mouth 2 (two) times daily. 60 tablet 11  . PARoxetine (PAXIL) 40 MG tablet Take 40 mg by mouth daily.  3  . polyethylene glycol (MIRALAX / GLYCOLAX) packet Take 17 g by mouth daily. (Patient taking differently: Take 17 g by mouth 2 (two) times daily. ) 14 each 12  . promethazine (PHENERGAN) 25 MG tablet Take 25 mg by mouth every 6 (six) hours as needed for nausea or vomiting.    . pyridostigmine (MESTINON) 60 MG tablet Take 1 tablet (60 mg total) by mouth 3 (three) times daily. 90 tablet 11  . sennosides-docusate sodium (SENOKOT-S) 8.6-50 MG tablet Take 1 tablet by mouth 2 (two) times daily. 30 tablet 0  . SUMAtriptan  (IMITREX) 25 MG tablet Take 50 mg by mouth daily as needed for migraine. May repeat in 2 hours if headache persists or recurs.    . traZODone (DESYREL) 150 MG tablet Take 75 mg by mouth at bedtime.      No current facility-administered medications on file prior to visit.     No Known Allergies  Family History  Problem Relation Age of Onset  . Hypertension Mother   . Hypertension Son   . Diabetes Neg Hx   . Thyroid disease Neg Hx     BP 136/80   Pulse 65   Ht 5\' 2"  (1.575 m)   Wt 138 lb (62.6 kg)   SpO2 96%   BMI 25.24 kg/m    Review of Systems Denies fever    Objective:   Physical Exam VITAL SIGNS:  See vs page GENERAL: no distress NECK: thyroid is approx 3-5 times normal size, diffuse.  No palpable nodule.   Neuro: coarse tremor of the hands SKIN: not diaphoretic.   Lab Results  Component Value Date   TSH 7.37 (H) 04/24/2016      Assessment & Plan:  Hyperthyroidism: slightly overcontrolled.  Reduce methimazole to 5 mg qd.

## 2016-04-24 NOTE — Patient Instructions (Signed)
blood tests are requested for you today.  We'll let you know about the results. You do not need to stop taking the amiodarone--we'll work around that.  Please come back for a follow-up appointment in 3 months.   

## 2016-05-01 ENCOUNTER — Encounter: Payer: Self-pay | Admitting: Endocrinology

## 2016-05-02 ENCOUNTER — Telehealth: Payer: Self-pay | Admitting: Endocrinology

## 2016-05-02 NOTE — Telephone Encounter (Signed)
Paula Bird from [facility] [place] has questions about medication methimazole (TAPAZOLE) 5 MG tablet  Please advise  [phone]

## 2016-06-07 ENCOUNTER — Ambulatory Visit (INDEPENDENT_AMBULATORY_CARE_PROVIDER_SITE_OTHER): Payer: Medicare Other | Admitting: Neurology

## 2016-06-07 ENCOUNTER — Encounter: Payer: Self-pay | Admitting: Neurology

## 2016-06-07 ENCOUNTER — Encounter (INDEPENDENT_AMBULATORY_CARE_PROVIDER_SITE_OTHER): Payer: Self-pay

## 2016-06-07 VITALS — BP 149/76 | HR 65 | Ht 62.0 in | Wt 136.0 lb

## 2016-06-07 DIAGNOSIS — G7000 Myasthenia gravis without (acute) exacerbation: Secondary | ICD-10-CM | POA: Diagnosis not present

## 2016-06-07 DIAGNOSIS — R269 Unspecified abnormalities of gait and mobility: Secondary | ICD-10-CM

## 2016-06-07 NOTE — Patient Instructions (Signed)
Mestinon

## 2016-06-07 NOTE — Progress Notes (Signed)
PATIENT: Paula Bird DOB: 10-Feb-1926  Chief Complaint  Patient presents with  . Myasthenia Gravis    She is here with her daughter, Paula Bird.  Overall, she feel her symptoms have improved with Mestinon and CellCept.  She will occasionally have increased fatigue.     HISTORICAL  Paula Bird is 83 year old right-handed female, accompanied by her daughter Paula Bird, seen in refer by primary care physician Dr. Merlene Laughter for evaluation of eye pain, right eye swelling, initial evaluation was on January 31 2016.  I reviewed and summarized the referring note, she had a history of hypertension, paroxysmal atrial fibrillation, depression, obsessive-compulsive disorder, acid reflux, polypharmacy treatment, also had a history of left knee, left hip replacement,  She used to lives alone at home, but began to suffer multiple falls around 2015, has moved to [facility] since 2016.  She was noted to have right side droopy eyelid swelling since September 2017, gradually getting worse, light sensitivity, she had chronic headache, tends to stay at the right side, with her new right eye symptoms, she noticed more right behind eye pain, she was seen by optometrist, was given eyedrops of unknown name, with only mild improvement. Is difficulty for her to open her right eye,  She denies rash broke out, she denies hearing loss, no dysarthria, no dysphagia  Laboratory evaluation in September 2017, normal CMP with creatinine 0.61, normal CBC, with hemoglobin of 12.2, TSH was decreased to 0.008 with high free T3 8.3, free thyroxine1.50,  Update February 07 2016: She was diagnosed with seropositive generalized myasthenia gravis, bulbar predominant,  Laboratory evaluation showed positive acetylcholine binding antibody 8.3, blocking antibody 39, anti-striation antibodies 1:160, normal CPK 21, C-reactive protein, ESR, folic acid, vitamin X38, mild elevated free T3 4.Normal free T4,  decreased TSH less than 0.01,  She continue have significant right ptosis, intermittent double vision, denies swallowing difficulty, gait abnormality she attributed to left hip and knee pain,  UPDATE Dec 7th 2017: She was seen by endocrinologist Dr. 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 Einar Gip showed no significant abnormality  She is able to tolerate prednisone, on tapering dose, CellCept 500 mg twice a day, Mestinon 3 times a day  CT chest showed multiple bilateral lung nodule, no thymus pathology noticed, there was aorta 4 cm aneurysm  UPDATE June 07 2016: Seropositive generalized myasthenia gravis She can open her right eye much better, denied double vision, continue have gait difficulty, but sounds like a chronic problem, reviewed her medication list, she is taking CellCept 500 mg twice a day, no longer taking prednisone, and Mestinon 60 mg 3 times a day,  REVIEW OF SYSTEMS: Full 14 system review of systems performed and notable only for as above  ALLERGIES: No Known Allergies  HOME MEDICATIONS: Current Outpatient Prescriptions  Medication Sig Dispense Refill  . acetaminophen (TYLENOL) 325 MG tablet Take 650 mg by mouth every 4 (four) hours as needed for mild pain, moderate pain, fever or headache.     Marland Kitchen amiodarone (PACERONE) 200 MG tablet Take 200 mg by mouth daily.     Marland Kitchen amitriptyline (ELAVIL) 75 MG tablet Take 75 mg by mouth at bedtime.  10  . bisacodyl (DULCOLAX) 10 MG suppository Place 10 mg rectally daily as needed for moderate constipation.    . Cholecalciferol (VITAMIN D3) 2000 units TABS Take 1 tablet by  mouth daily.    . Cranberry 425 MG CAPS Take 425 mg by mouth 2 (two) times daily.    Marland Kitchen diltiazem (CARDIZEM CD) 300 MG 24 hr capsule Take 300 mg by mouth daily.  10  . ferrous sulfate 325 (65 FE) MG EC tablet  Take 325 mg by mouth daily.    . hydrochlorothiazide (HYDRODIURIL) 25 MG tablet Take 1 tablet (25 mg total) by mouth every Monday, Wednesday, and Friday. 30 tablet 0  . lisinopril (PRINIVIL,ZESTRIL) 40 MG tablet Take 40 mg by mouth daily.  10  . methimazole (TAPAZOLE) 5 MG tablet Take 1 tablet (5 mg total) by mouth 3 (three) times daily. 30 tablet 11  . Multiple Vitamins-Minerals (MULTIVITAMIN GUMMIES ADULT PO) Take 1 tablet by mouth daily.    . mycophenolate (CELLCEPT) 500 MG tablet Take 1 tablet (500 mg total) by mouth 2 (two) times daily. 60 tablet 11  . PARoxetine (PAXIL) 40 MG tablet Take 40 mg by mouth daily.  3  . polyethylene glycol (MIRALAX / GLYCOLAX) packet Take 17 g by mouth daily. (Patient taking differently: Take 17 g by mouth 2 (two) times daily. ) 14 each 12  . promethazine (PHENERGAN) 25 MG tablet Take 25 mg by mouth every 6 (six) hours as needed for nausea or vomiting.    . pyridostigmine (MESTINON) 60 MG tablet Take 1 tablet (60 mg total) by mouth 3 (three) times daily. 90 tablet 11  . sennosides-docusate sodium (SENOKOT-S) 8.6-50 MG tablet Take 1 tablet by mouth 2 (two) times daily. 30 tablet 0  . SUMAtriptan (IMITREX) 25 MG tablet Take 50 mg by mouth daily as needed for migraine. May repeat in 2 hours if headache persists or recurs.    . traZODone (DESYREL) 150 MG tablet Take 75 mg by mouth at bedtime.      No current facility-administered medications for this visit.     PAST MEDICAL HISTORY: Past Medical History:  Diagnosis Date  . Acute and chronic respiratory failure, unspecified whether with hypoxia or hypercapnia   . Afib ([place])   . Allergic rhinitis   . Anemia   . CKD (chronic kidney disease)   . Constipation   . COPD (chronic obstructive pulmonary disease) ([place])   . Depression   . Diplopia   . Dysphagia   . Edema, lower extremity   . Esophageal dysmotility   . Gait instability   . GERD (gastroesophageal reflux disease)   . Hypertension   . Hypothyroidism    . IBS (irritable bowel syndrome)   . IDA (iron deficiency anemia)   . Insomnia   . Leukocytosis   . Myasthenia gravis ([place])   . Oral thrush   . PAF (paroxysmal atrial fibrillation) ([place])   . Physical deconditioning   . Protein calorie malnutrition ([place])   . Thyroid disease   . Thyroid mass   . Vertigo     PAST SURGICAL HISTORY: Past Surgical History:  Procedure Laterality Date  . ABDOMINAL HYSTERECTOMY    . APPENDECTOMY    . TOTAL HIP ARTHROPLASTY Left   . TOTAL KNEE ARTHROPLASTY Left     FAMILY HISTORY: Family History  Problem Relation Age of Onset  . Hypertension Mother   . Hypertension Son   . Diabetes Neg Hx   . Thyroid disease Neg Hx     SOCIAL HISTORY:  Social History   Social History  . Marital status: Widowed    Spouse name: N/A  . Number of children: 2  . Years  of education: HS   Occupational History  . Retired    Social History Main Topics  . Smoking status: Never Smoker  . Smokeless tobacco: Never Used  . Alcohol use No  . Drug use: No  . Sexual activity: Not on file   Other Topics Concern  . Not on file   Social History Narrative   Resides at [facility].   Left-handed.   2 cups caffeine per day.     PHYSICAL EXAM   Vitals:   06/07/16 1332  BP: (!) 149/76  Pulse: 65  Weight: 136 lb (61.7 kg)  Height: 5' 2"  (1.575 m)    Not recorded      Body mass index is 24.87 kg/m.  PHYSICAL EXAMNIATION:  Gen: NAD, conversant, well nourised, obese, well groomed                     Cardiovascular: Regular rate rhythm, no peripheral edema, warm, nontender. Eyes: Conjunctivae clear without exudates or hemorrhage Neck: Supple, no carotid bruits. Pulmonary: Clear to auscultation bilaterally   NEUROLOGICAL EXAM:  MENTAL STATUS: Speech:    Speech is normal; fluent and spontaneous with normal comprehension.  Cognition:     Orientation to time, place and person     Normal recent and remote memory     Normal Attention span  and concentration     Normal Language, naming, repeating,spontaneous speech     Fund of knowledge   CRANIAL NERVES: CN II:   Pupils are round equal and briskly reactive to light. CN III, IV, VI: Slight right ptosis, cover upper edge of right pupil, she has slight bilateral eye-closure weakness, I do not see any significant cheek puff weakness. CN V: Facial sensation is intact to pinprick in all 3 divisions bilaterally. Corneal responses are intact.  CN VII:  cover and uncover testing showed mild bilateral exophoria.she has mild eye-closure, cheek puff weakness.   VIII: Hearing is normal to rubbing fingers CN IX, X: Palate elevates symmetrically. Phonation is normal. CN XI: Head turning and shoulder shrug are intact CN XII: Tongue is midline with normal movements and no atrophy.  MOTOR: She has NO neck flexion, bilateral shoulder abduction, elbow flexion weakness, she has slight bilateral hip flexion weakness.   REFLEXES: Reflexes are 2+ and symmetric at the biceps, triceps, knees, and ankles. Plantar responses are flexor.  SENSORY: Intact to light touch, pinprick, positional sensation and vibratory sensation are intact in fingers and toes.  COORDINATION: Rapid alternating movements and fine finger movements are intact. There is no dysmetria on finger-to-nose and heel-knee-shin.    GAIT/STANCE: She can get up from seated position arm cross, cautious, stiff, mildly unsteady gait.  DIAGNOSTIC DATA (LABS, IMAGING, TESTING) - I reviewed patient records, labs, notes, testing and imaging myself where available.   ASSESSMENT AND PLAN  Michaila Kenney is a 83 y.o. female   Serum Positive myasthenia gravis, Significant ocular involvement, with mild bulbar and limb muscle weakness  Continue CellCept 500 mg twice a day  Stop Mestinon  Gait abnormality Multifactorial, this includes aging, deconditioning, knee pain, lumbar degenerative disc disease, After discuss with patient and her  family, I have encouraged her continue ambulate with assistant, Will hold off evaluation at this point   Marcial Pacas, M.D. Ph.D.  [facility] [address] [place], [place] [address] Ph: [phone] Fax: [phone]  CC: Merlene Laughter, MD

## 2016-07-24 ENCOUNTER — Ambulatory Visit (INDEPENDENT_AMBULATORY_CARE_PROVIDER_SITE_OTHER): Payer: Medicare Other | Admitting: Endocrinology

## 2016-07-24 ENCOUNTER — Encounter: Payer: Self-pay | Admitting: Endocrinology

## 2016-07-24 VITALS — BP 136/80 | HR 78 | Ht 61.0 in | Wt 141.0 lb

## 2016-07-24 DIAGNOSIS — E0590 Thyrotoxicosis, unspecified without thyrotoxic crisis or storm: Secondary | ICD-10-CM | POA: Diagnosis not present

## 2016-07-24 LAB — TSH: TSH: 3.48 u[IU]/mL (ref 0.35–4.50)

## 2016-07-24 LAB — T4, FREE: Free T4: 0.5 ng/dL — ABNORMAL LOW (ref 0.60–1.60)

## 2016-07-24 MED ORDER — METHIMAZOLE 5 MG PO TABS: 5.0000 mg | ORAL_TABLET | Freq: Every day | ORAL | 11 refills | Status: DC

## 2016-07-24 NOTE — Progress Notes (Signed)
Subjective:    Patient ID: Paula Bird, female    DOB: [birth year], 83 y.o.   MRN: [id number]  HPI Pt returns for f/u of autoimmune thyroid dz (dx'ed 2016; characterized by alternating hyper- and hypothyroidism; she was rx'ed tapazole in 2016, for a brief time, but this was changed to synthroid in early 2017; this was stopped in Aug of 2017, for a suppressed TSH, and tapazole was started; dtr provides hx, as pt has memory loss; she lives at [facility]).  dtr says pt feels well in general.  Med list is sent by facility, and reviewed here.  Tapazole is verified as qd, which is correct dosage.  Our med list of TID was incorrect, and is corrected.   Past Medical History:  Diagnosis Date  . Acute and chronic respiratory failure, unspecified whether with hypoxia or hypercapnia (HCC)   . Afib (HCC)   . Allergic rhinitis   . Anemia   . CKD (chronic kidney disease)   . Constipation   . COPD (chronic obstructive pulmonary disease) (HCC)   . Depression   . Diplopia   . Dysphagia   . Edema, lower extremity   . Esophageal dysmotility   . Gait instability   . GERD (gastroesophageal reflux disease)   . Hypertension   . Hypothyroidism   . IBS (irritable bowel syndrome)   . IDA (iron deficiency anemia)   . Insomnia   . Leukocytosis   . Myasthenia gravis (HCC)   . Oral thrush   . PAF (paroxysmal atrial fibrillation) (HCC)   . Physical deconditioning   . Protein calorie malnutrition (HCC)   . Thyroid disease   . Thyroid mass   . Vertigo     Past Surgical History:  Procedure Laterality Date  . ABDOMINAL HYSTERECTOMY    . APPENDECTOMY    . TOTAL HIP ARTHROPLASTY Left   . TOTAL KNEE ARTHROPLASTY Left     Social History   Social History  . Marital status: Widowed    Spouse name: N/A  . Number of children: 2  . Years of education: HS   Occupational History  . Retired    Social History Main Topics  . Smoking status: Never Smoker  . Smokeless tobacco: Never Used  .  Alcohol use No  . Drug use: No  . Sexual activity: Not on file   Other Topics Concern  . Not on file   Social History Narrative   Resides at [facility].   Left-handed.   2 cups caffeine per day.    Current Outpatient Prescriptions on File Prior to Visit  Medication Sig Dispense Refill  . amiodarone (PACERONE) 200 MG tablet Take 200 mg by mouth daily.     Marland Kitchen amitriptyline (ELAVIL) 75 MG tablet Take 75 mg by mouth at bedtime.  10  . bisacodyl (DULCOLAX) 10 MG suppository Place 10 mg rectally daily as needed for moderate constipation.    . Cholecalciferol (VITAMIN D3) 2000 units TABS Take 1 tablet by mouth daily.    . Cranberry 425 MG CAPS Take 425 mg by mouth 2 (two) times daily.    Marland Kitchen diltiazem (CARDIZEM CD) 300 MG 24 hr capsule Take 300 mg by mouth daily.  10  . ferrous sulfate 325 (65 FE) MG EC tablet Take 325 mg by mouth daily.    Marland Kitchen gentamicin (GARAMYCIN) 0.3 % ophthalmic ointment Place 1 application into the right eye 2 (two) times daily.    . hydrochlorothiazide (HYDRODIURIL) 25 MG tablet  Take 1 tablet (25 mg total) by mouth every Monday, Wednesday, and Friday. 30 tablet 0  . lisinopril (PRINIVIL,ZESTRIL) 40 MG tablet Take 40 mg by mouth daily.  10  . Multiple Vitamins-Minerals (MULTIVITAMIN GUMMIES ADULT PO) Take 1 tablet by mouth daily.    . mycophenolate (CELLCEPT) 500 MG tablet Take 1 tablet (500 mg total) by mouth 2 (two) times daily. 60 tablet 11  . Olopatadine HCl (PATADAY) 0.2 % SOLN Apply 1 drop to eye daily.    Marland Kitchen PARoxetine (PAXIL) 40 MG tablet Take 40 mg by mouth daily.  3  . polyethylene glycol (MIRALAX / GLYCOLAX) packet Take 17 g by mouth daily. (Patient taking differently: Take 17 g by mouth 2 (two) times daily. ) 14 each 12  . promethazine (PHENERGAN) 25 MG tablet Take 25 mg by mouth every 6 (six) hours as needed for nausea or vomiting.    . pyridostigmine (MESTINON) 60 MG tablet Take 1 tablet (60 mg total) by mouth 3 (three) times daily. 90 tablet 11  .  sennosides-docusate sodium (SENOKOT-S) 8.6-50 MG tablet Take 1 tablet by mouth 2 (two) times daily. 30 tablet 0  . SUMAtriptan (IMITREX) 25 MG tablet Take 50 mg by mouth daily as needed for migraine. May repeat in 2 hours if headache persists or recurs.    . traZODone (DESYREL) 150 MG tablet Take 75 mg by mouth at bedtime.     Marland Kitchen acetaminophen (TYLENOL) 325 MG tablet Take 650 mg by mouth every 4 (four) hours as needed for mild pain, moderate pain, fever or headache.      No current facility-administered medications on file prior to visit.     No Known Allergies  Family History  Problem Relation Age of Onset  . Hypertension Mother   . Hypertension Son   . Diabetes Neg Hx   . Thyroid disease Neg Hx     BP 136/80   Pulse 78   Ht  (1.549 m)   Wt 141 lb (64 kg)   BMI 26.64 kg/m    Review of Systems Denies fever and weight change.     Objective:   Physical Exam VITAL SIGNS:  See vs page.  GENERAL: no distress.  NECK: thyroid is approx 3-5 times normal size, diffuse.  No palpable nodule.   Neuro: coarse tremor of the hands.  SKIN: not diaphoretic.    Lab Results  Component Value Date   TSH 3.48 07/24/2016      Assessment & Plan:  Hyperthyroidism: well-controlled.  Please continue the same methimazole.

## 2016-07-24 NOTE — Patient Instructions (Addendum)
blood tests are requested for you today.  We'll let you know about the results. You do not need to stop taking the amiodarone--we'll work around that.  Please come back for a follow-up appointment in 4 months.

## 2016-10-16 ENCOUNTER — Ambulatory Visit (INDEPENDENT_AMBULATORY_CARE_PROVIDER_SITE_OTHER): Payer: Medicare Other | Admitting: Neurology

## 2016-10-16 ENCOUNTER — Encounter: Payer: Self-pay | Admitting: Neurology

## 2016-10-16 ENCOUNTER — Encounter (INDEPENDENT_AMBULATORY_CARE_PROVIDER_SITE_OTHER): Payer: Self-pay

## 2016-10-16 VITALS — Ht 61.0 in | Wt 147.0 lb

## 2016-10-16 DIAGNOSIS — G7000 Myasthenia gravis without (acute) exacerbation: Secondary | ICD-10-CM

## 2016-10-16 MED ORDER — MYCOPHENOLATE MOFETIL 500 MG PO TABS: 500.0000 mg | ORAL_TABLET | Freq: Two times a day (BID) | ORAL | 11 refills | Status: DC

## 2016-10-16 NOTE — Progress Notes (Signed)
PATIENT: Paula Bird DOB: [birth year]  Chief Complaint  Patient presents with  . Myasthenia Gravis    She is here with her daughter, Opal Sidles.  Still feels her right eye is weak and she sometimes has difficulty with keeping it open.  . Gait Problem    She uses a rolling walker to assist with ambulation.  She uses a wheelchair only when out for appointments.     HISTORICAL  Paula Bird is 83 year old right-handed female, accompanied by her daughter [name], seen in refer by primary care physician Dr. Merlene Laughter for evaluation of eye pain, right eye swelling, initial evaluation was on January 31 2016.  I reviewed and summarized the referring note, she had a history of hypertension, paroxysmal atrial fibrillation, depression, obsessive-compulsive disorder, acid reflux, polypharmacy treatment, also had a history of left knee, left hip replacement,  She used to lives alone at home, but began to suffer multiple falls around 2015, has moved to [facility] since 2016.  She was noted to have right side droopy eyelid swelling since September 2017, gradually getting worse, light sensitivity, she had chronic headache, tends to stay at the right side, with her new right eye symptoms, she noticed more right behind eye pain, she was seen by optometrist, was given eyedrops of unknown name, with only mild improvement. Is difficulty for her to open her right eye,  She denies rash broke out, she denies hearing loss, no dysarthria, no dysphagia  Laboratory evaluation in September 2017, normal CMP with creatinine 0.61, normal CBC, with hemoglobin of 12.2, TSH was decreased to 0.008 with high free T3 8.3, free thyroxine1.50,  Update February 07 2016: She was diagnosed with seropositive generalized myasthenia gravis, bulbar predominant,  Laboratory evaluation showed positive acetylcholine binding antibody 8.3, blocking antibody 39, anti-striation antibodies 1:160, normal CPK 21,  C-reactive protein, ESR, folic acid, vitamin K46, mild elevated free T3 4.Normal free T4, decreased TSH less than 0.01,  She continue have significant right ptosis, intermittent double vision, denies swallowing difficulty, gait abnormality she attributed to left hip and knee pain,  UPDATE Dec 7th 2017: She was seen by endocrinologist Dr. 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 Einar Gip showed no significant abnormality  She is able to tolerate prednisone, on tapering dose, CellCept 500 mg twice a day, Mestinon 3 times a day  CT chest showed multiple bilateral lung nodule, no thymus pathology noticed, there was aorta 4 cm aneurysm  UPDATE June 07 2016: Seropositive generalized myasthenia gravis She can open her right eye much better, denied double vision, continue have gait difficulty, but sounds like a chronic problem, reviewed her medication list, she is taking CellCept 500 mg twice a day, no longer taking prednisone, and Mestinon 60 mg 3 times a day,  UPDATE October 16 2016: Was able to review laboratory evaluations, normal TSH, free T4 was mildly elevated 0.5, negative ANA, there is no significant change in her myasthenia gravis, she has no double vision, no droopy eyelid, able to tolerate CellCept 500 mg twice a day  She complains of worsening anxiety , depression, feel anxious, shortness of breath  REVIEW OF SYSTEMS: Full 14 system review of systems performed and notable only for as above  ALLERGIES: No Known Allergies  HOME MEDICATIONS: Current Outpatient Prescriptions  Medication Sig Dispense Refill  . acetaminophen (TYLENOL) 325 MG tablet Take  650 mg by mouth every 4 (four) hours as needed for mild pain, moderate pain, fever or headache.     Marland Kitchen amiodarone (PACERONE) 200 MG tablet Take 200 mg by mouth daily.     Marland Kitchen  amitriptyline (ELAVIL) 75 MG tablet Take 75 mg by mouth at bedtime.  10  . bisacodyl (DULCOLAX) 10 MG suppository Place 10 mg rectally daily as needed for moderate constipation.    . Cholecalciferol (VITAMIN D3) 2000 units TABS Take 1 tablet by mouth daily.    . Cranberry 425 MG CAPS Take 425 mg by mouth 2 (two) times daily.    Marland Kitchen diltiazem (CARDIZEM CD) 300 MG 24 hr capsule Take 300 mg by mouth daily.  10  . ferrous sulfate 325 (65 FE) MG EC tablet Take 325 mg by mouth daily.    Marland Kitchen gentamicin (GARAMYCIN) 0.3 % ophthalmic ointment Place 1 application into the right eye 2 (two) times daily.    . hydrochlorothiazide (HYDRODIURIL) 25 MG tablet Take 1 tablet (25 mg total) by mouth every Monday, Wednesday, and Friday. 30 tablet 0  . lisinopril (PRINIVIL,ZESTRIL) 40 MG tablet Take 40 mg by mouth daily.  10  . methimazole (TAPAZOLE) 5 MG tablet Take 1 tablet (5 mg total) by mouth daily. 30 tablet 11  . Multiple Vitamins-Minerals (MULTIVITAMIN GUMMIES ADULT PO) Take 1 tablet by mouth daily.    . mycophenolate (CELLCEPT) 500 MG tablet Take 1 tablet (500 mg total) by mouth 2 (two) times daily. 60 tablet 11  . Olopatadine HCl (PATADAY) 0.2 % SOLN Apply 1 drop to eye daily.    Marland Kitchen PARoxetine (PAXIL) 40 MG tablet Take 40 mg by mouth daily.  3  . polyethylene glycol (MIRALAX / GLYCOLAX) packet Take 17 g by mouth daily. (Patient taking differently: Take 17 g by mouth 2 (two) times daily. ) 14 each 12  . promethazine (PHENERGAN) 25 MG tablet Take 25 mg by mouth every 6 (six) hours as needed for nausea or vomiting.    . pyridostigmine (MESTINON) 60 MG tablet Take 1 tablet (60 mg total) by mouth 3 (three) times daily. 90 tablet 11  . sennosides-docusate sodium (SENOKOT-S) 8.6-50 MG tablet Take 1 tablet by mouth 2 (two) times daily. 30 tablet 0  . SUMAtriptan (IMITREX) 25 MG tablet Take 50 mg by mouth daily as needed for migraine. May repeat in 2 hours if headache persists or recurs.    . traZODone (DESYREL) 150 MG  tablet Take 75 mg by mouth at bedtime.      No current facility-administered medications for this visit.     PAST MEDICAL HISTORY: Past Medical History:  Diagnosis Date  . Acute and chronic respiratory failure, unspecified whether with hypoxia or hypercapnia ([place])   . Afib ([place])   . Allergic rhinitis   . Anemia   . CKD (chronic kidney disease)   . Constipation   . COPD (chronic obstructive pulmonary disease) ([place])   . Depression   . Diplopia   . Dysphagia   . Edema, lower extremity   . Esophageal dysmotility   . Gait instability   . GERD (gastroesophageal reflux disease)   . Hypertension   . Hypothyroidism   . IBS (irritable bowel syndrome)   . IDA (iron deficiency anemia)   . Insomnia   . Leukocytosis   . Myasthenia gravis ([place])   . Oral thrush   . PAF (paroxysmal atrial fibrillation) ([place])   . Physical deconditioning   . Protein calorie malnutrition ([place])   .  Thyroid disease   . Thyroid mass   . Vertigo     PAST SURGICAL HISTORY: Past Surgical History:  Procedure Laterality Date  . ABDOMINAL HYSTERECTOMY    . APPENDECTOMY    . TOTAL HIP ARTHROPLASTY Left   . TOTAL KNEE ARTHROPLASTY Left     FAMILY HISTORY: Family History  Problem Relation Age of Onset  . Hypertension Mother   . Hypertension Son   . Diabetes Neg Hx   . Thyroid disease Neg Hx     SOCIAL HISTORY:  Social History   Social History  . Marital status: Widowed    Spouse name: N/A  . Number of children: 2  . Years of education: HS   Occupational History  . Retired    Social History Main Topics  . Smoking status: Never Smoker  . Smokeless tobacco: Never Used  . Alcohol use No  . Drug use: No  . Sexual activity: Not on file   Other Topics Concern  . Not on file   Social History Narrative   Resides at [facility].   Left-handed.   2 cups caffeine per day.     PHYSICAL EXAM   Vitals:   10/16/16 1327  Weight: 147 lb (66.7 kg)  Height: 5' 1"  (1.549 m)    Not  recorded      Body mass index is 27.78 kg/m.  PHYSICAL EXAMNIATION:  Gen: NAD, conversant, well nourised, obese, well groomed                     Cardiovascular: Regular rate rhythm, no peripheral edema, warm, nontender. Eyes: Conjunctivae clear without exudates or hemorrhage Neck: Supple, no carotid bruits. Pulmonary: Clear to auscultation bilaterally   NEUROLOGICAL EXAM:  MENTAL STATUS: Speech:    Speech is normal; fluent and spontaneous with normal comprehension.  Cognition:     Orientation to time, place and person     Normal recent and remote memory     Normal Attention span and concentration     Normal Language, naming, repeating,spontaneous speech     Fund of knowledge   CRANIAL NERVES: CN II:   Pupils are round equal and briskly reactive to light. CN III, IV, VI: Slight right ptosis, cover upper edge of right pupil, she has slight bilateral eye-closure weakness, I do not see any significant cheek puff weakness. CN V: Facial sensation is intact to pinprick in all 3 divisions bilaterally. Corneal responses are intact.  CN VII:  cover and uncover testing showed mild bilateral exophoria.she has mild eye-closure, cheek puff weakness.   VIII: Hearing is normal to rubbing fingers CN IX, X: Palate elevates symmetrically. Phonation is normal. CN XI: Head turning and shoulder shrug are intact CN XII: Tongue is midline with normal movements and no atrophy.  MOTOR: She has NO neck flexion, bilateral shoulder abduction, elbow flexion weakness, she has slight bilateral hip flexion weakness.   REFLEXES: Reflexes are 2+ and symmetric at the biceps, triceps, knees, and ankles. Plantar responses are flexor.  SENSORY: Intact to light touch, pinprick, positional sensation and vibratory sensation are intact in fingers and toes.  COORDINATION: Rapid alternating movements and fine finger movements are intact. There is no dysmetria on finger-to-nose and heel-knee-shin.     GAIT/STANCE: She can get up from seated position arm cross, cautious, stiff, mildly unsteady gait.  DIAGNOSTIC DATA (LABS, IMAGING, TESTING) - I reviewed patient records, labs, notes, testing and imaging myself where available.   ASSESSMENT AND PLAN  Paula Bird is a 83 y.o. female   Serum Positive myasthenia gravis, Significant ocular involvement, with mild bulbar and limb muscle weakness  Continue CellCept 500 mg twice a day  Laboratory evaluation today  Gait abnormality Multifactorial, this includes aging, deconditioning, knee pain, lumbar degenerative disc disease,  Anxiety depression She is already on polypharmacy treatment, trazodone, seroquel, Paxil, amitriptyline    Marcial Pacas, M.D. Ph.D.  [facility] [address], [place], [place] [address] Ph: [phone] Fax: [phone]  CC: Merlene Laughter, MD

## 2016-10-17 LAB — CBC WITH DIFFERENTIAL/PLATELET
BASOS: 0 %
Basophils Absolute: 0 10*3/uL (ref 0.0–0.2)
EOS (ABSOLUTE): 0.1 10*3/uL (ref 0.0–0.4)
EOS: 2 %
HEMATOCRIT: 42.2 % (ref 34.0–46.6)
HEMOGLOBIN: 13.7 g/dL (ref 11.1–15.9)
IMMATURE GRANS (ABS): 0 10*3/uL (ref 0.0–0.1)
IMMATURE GRANULOCYTES: 0 %
LYMPHS: 24 %
Lymphocytes Absolute: 1.4 10*3/uL (ref 0.7–3.1)
MCH: 29 pg (ref 26.6–33.0)
MCHC: 32.5 g/dL (ref 31.5–35.7)
MCV: 89 fL (ref 79–97)
MONOS ABS: 0.6 10*3/uL (ref 0.1–0.9)
Monocytes: 11 %
NEUTROS PCT: 63 %
Neutrophils Absolute: 3.6 10*3/uL (ref 1.4–7.0)
Platelets: 100 10*3/uL — CL (ref 150–379)
RBC: 4.72 x10E6/uL (ref 3.77–5.28)
RDW: 13.9 % (ref 12.3–15.4)
WBC: 5.7 10*3/uL (ref 3.4–10.8)

## 2016-10-17 LAB — COMPREHENSIVE METABOLIC PANEL
A/G RATIO: 1.6 (ref 1.2–2.2)
ALBUMIN: 4.1 g/dL (ref 3.5–4.7)
ALT: 15 IU/L (ref 0–32)
AST: 16 IU/L (ref 0–40)
Alkaline Phosphatase: 79 IU/L (ref 39–117)
BUN/Creatinine Ratio: 25 (ref 12–28)
BUN: 15 mg/dL (ref 8–27)
Bilirubin Total: <0.2 mg/dL (ref 0.0–1.2)
CALCIUM: 9.1 mg/dL (ref 8.7–10.3)
CO2: 24 mmol/L (ref 20–29)
Chloride: 99 mmol/L (ref 96–106)
Creatinine, Ser: 0.61 mg/dL (ref 0.57–1.00)
GFR, EST AFRICAN AMERICAN: 93 mL/min/{1.73_m2} (ref >59)
GFR, EST NON AFRICAN AMERICAN: 81 mL/min/{1.73_m2} (ref >59)
GLOBULIN, TOTAL: 2.5 g/dL (ref 1.5–4.5)
Glucose: 105 mg/dL — ABNORMAL HIGH (ref 65–99)
Potassium: 3.9 mmol/L (ref 3.5–5.2)
SODIUM: 141 mmol/L (ref 134–144)
TOTAL PROTEIN: 6.6 g/dL (ref 6.0–8.5)

## 2016-11-01 ENCOUNTER — Other Ambulatory Visit: Payer: Self-pay | Admitting: Internal Medicine

## 2016-11-01 DIAGNOSIS — M545 Low back pain: Principal | ICD-10-CM

## 2016-11-01 DIAGNOSIS — G8929 Other chronic pain: Secondary | ICD-10-CM

## 2016-11-03 ENCOUNTER — Emergency Department (HOSPITAL_COMMUNITY)
Admission: EM | Admit: 2016-11-03 | Discharge: 2016-11-03 | Disposition: A | Discharge status: Home / Self Care | Payer: Medicare Other | Attending: Emergency Medicine | Admitting: Emergency Medicine

## 2016-11-03 ENCOUNTER — Encounter (HOSPITAL_COMMUNITY): Payer: Self-pay | Admitting: Emergency Medicine

## 2016-11-03 ENCOUNTER — Emergency Department (HOSPITAL_COMMUNITY): Payer: Medicare Other

## 2016-11-03 DIAGNOSIS — M549 Dorsalgia, unspecified: Secondary | ICD-10-CM | POA: Diagnosis not present

## 2016-11-03 DIAGNOSIS — J449 Chronic obstructive pulmonary disease, unspecified: Secondary | ICD-10-CM | POA: Diagnosis not present

## 2016-11-03 DIAGNOSIS — I129 Hypertensive chronic kidney disease with stage 1 through stage 4 chronic kidney disease, or unspecified chronic kidney disease: Secondary | ICD-10-CM | POA: Diagnosis not present

## 2016-11-03 DIAGNOSIS — Y92129 Unspecified place in nursing home as the place of occurrence of the external cause: Secondary | ICD-10-CM

## 2016-11-03 DIAGNOSIS — E039 Hypothyroidism, unspecified: Secondary | ICD-10-CM | POA: Diagnosis not present

## 2016-11-03 DIAGNOSIS — E1122 Type 2 diabetes mellitus with diabetic chronic kidney disease: Secondary | ICD-10-CM | POA: Insufficient documentation

## 2016-11-03 DIAGNOSIS — R51 Headache: Secondary | ICD-10-CM | POA: Diagnosis not present

## 2016-11-03 DIAGNOSIS — M542 Cervicalgia: Secondary | ICD-10-CM | POA: Insufficient documentation

## 2016-11-03 DIAGNOSIS — Z79899 Other long term (current) drug therapy: Secondary | ICD-10-CM | POA: Diagnosis not present

## 2016-11-03 DIAGNOSIS — W19XXXA Unspecified fall, initial encounter: Secondary | ICD-10-CM

## 2016-11-03 DIAGNOSIS — N189 Chronic kidney disease, unspecified: Secondary | ICD-10-CM | POA: Insufficient documentation

## 2016-11-03 DIAGNOSIS — M8008XA Age-related osteoporosis with current pathological fracture, vertebra(e), initial encounter for fracture: Secondary | ICD-10-CM

## 2016-11-03 NOTE — ED Provider Notes (Signed)
[facility]-EMERGENCY DEPT Provider Note   CSN: [id number] Arrival date & time: 11/03/16  4098     History   Chief Complaint Chief Complaint  Patient presents with  . Fall  . Neck Pain  . Back Pain    HPI Paula Bird is a 83 y.o. female.  HPI  Pt was seen at 0815. Per EMS, NH report and pt, c/o sudden onset and resolution of one episode of fall that occurred this morning PTA. Pt states she slipped and fell while trying to go to the bathroom. Pt states she hit her head, and c/o neck and back pain. Denies LOC, no AMS, no prodromal symptoms before fall, no CP/SOB, no abd pain, no N/V/D, no focal motor weakness, no tingling/numbness in extremities.     Past Medical History:  Diagnosis Date  . Acute and chronic respiratory failure, unspecified whether with hypoxia or hypercapnia (HCC)   . Afib (HCC)   . Allergic rhinitis   . Anemia   . CKD (chronic kidney disease)   . Constipation   . COPD (chronic obstructive pulmonary disease) (HCC)   . Depression   . Diplopia   . Dysphagia   . Edema, lower extremity   . Esophageal dysmotility   . Gait instability   . GERD (gastroesophageal reflux disease)   . Hypertension   . Hypothyroidism   . IBS (irritable bowel syndrome)   . IDA (iron deficiency anemia)   . Insomnia   . Leukocytosis   . Myasthenia gravis (HCC)   . Oral thrush   . PAF (paroxysmal atrial fibrillation) (HCC)   . Physical deconditioning   . Protein calorie malnutrition (HCC)   . Thyroid disease   . Thyroid mass   . Vertigo     Patient Active Problem List   Diagnosis Date Noted  . Gait abnormality 06/07/2016  . Hyperthyroidism 04/24/2016  . Myasthenia gravis (HCC) 02/07/2016  . Ptosis of eyelid, right 01/31/2016  . Pain, eye, right 01/31/2016  . Falling 01/31/2016  . Vertigo 10/08/2015  . Colon polyp 08/30/2015  . Arthritis, degenerative 08/30/2015  . Acid indigestion 08/30/2015  . Psoriasis 08/30/2015  . Avitaminosis D 08/30/2015  . HCAP  (healthcare-associated pneumonia) 08/08/2015  . Respiratory distress 08/08/2015  . Essential hypertension 08/08/2015  . COPD exacerbation (HCC) 08/08/2015  . Dyspnea 08/04/2015  . Hypertension 08/04/2015  . Depression 08/04/2015  . Insomnia 08/04/2015  . Obstructive chronic bronchitis with acute exacerbation (HCC) 08/04/2015  . Thrombocytopenia (HCC) 08/04/2015  . Paroxysmal atrial fibrillation (HCC) 08/04/2015  . Acute exacerbation of chronic obstructive bronchitis (HCC) 08/04/2015  . Infection of urinary tract 07/01/2014  . Bradycardia 11/24/2013  . Fall in home 11/22/2013  . Pulmonary hypertension (HCC) 11/19/2013  . Excessive falling 09/07/2012  . Encounter for general adult medical examination without abnormal findings 03/24/2012  . Atrial paroxysmal tachycardia (HCC) 10/05/2011  . Family history of colon cancer 05/15/2011  . Mass of pelvis 05/15/2011    Past Surgical History:  Procedure Laterality Date  . ABDOMINAL HYSTERECTOMY    . APPENDECTOMY    . TOTAL HIP ARTHROPLASTY Left   . TOTAL KNEE ARTHROPLASTY Left     OB History    No data available       Home Medications    Prior to Admission medications   Medication Sig Start Date End Date Taking? Authorizing Provider  amiodarone (PACERONE) 200 MG tablet Take 200 mg by mouth daily.    Yes [provider]  amitriptyline (ELAVIL) 75 MG tablet  Take 75 mg by mouth at bedtime. 07/12/15  Yes [provider]  aspirin EC 81 MG tablet Take 81 mg by mouth daily.   Yes [provider]  Cholecalciferol (VITAMIN D3) 2000 units TABS Take 1 tablet by mouth daily.   Yes [provider]  Cranberry 425 MG CAPS Take 425 mg by mouth 2 (two) times daily.   Yes [provider]  diltiazem (CARDIZEM CD) 300 MG 24 hr capsule Take 300 mg by mouth daily. 03/04/16  Yes [provider]  esomeprazole (NEXIUM) 20 MG capsule Take 20 mg by mouth 2 (two) times daily.    Yes [provider]  hydrochlorothiazide (HYDRODIURIL) 25 MG tablet Take 1 tablet (25 mg total) by mouth every Monday, Wednesday, and Friday. 08/16/15  Yes Albertine GratesXu, Fang, MD  HYDROcodone-acetaminophen (NORCO/VICODIN) 5-325 MG tablet Take 1 tablet by mouth 3 (three) times daily. For pain 10/31/16  Yes [provider]  lisinopril (PRINIVIL,ZESTRIL) 40 MG tablet Take 40 mg by mouth daily. 03/04/16  Yes [provider]  meloxicam (MOBIC) 7.5 MG tablet Take 7.5 mg by mouth daily. 10/25/16  Yes [provider]  methimazole (TAPAZOLE) 5 MG tablet Take 1 tablet (5 mg total) by mouth daily. 07/24/16  Yes Romero BellingEllison, Sean, MD  Multiple Vitamins-Minerals (MULTIVITAMIN GUMMIES ADULT PO) Take 1 tablet by mouth daily.   Yes [provider]  mycophenolate (CELLCEPT) 500 MG tablet Take 1 tablet (500 mg total) by mouth 2 (two) times daily. 10/16/16  Yes Levert FeinsteinYan, Yijun, MD  PARoxetine (PAXIL) 40 MG tablet Take 40 mg by mouth daily. 07/23/15  Yes [provider]  polyethylene glycol (MIRALAX / GLYCOLAX) packet Take 17 g by mouth daily. Patient taking differently: Take 17 g by mouth 2 (two) times daily.  08/27/14  Yes Harris, Abigail, PA-C  polyvinyl alcohol (LIQUIFILM TEARS) 1.4 % ophthalmic solution Place 1 drop into both eyes 4 (four) times daily as needed for dry eyes.   Yes [provider]  QUEtiapine (SEROQUEL) 25 MG tablet Take 12.5 mg by mouth at bedtime. Taking 0.5 - 1 tablet at bedtime.    Yes [provider]  sennosides-docusate sodium (SENOKOT-S) 8.6-50 MG tablet Take 1 tablet by mouth 2 (two) times daily. 08/16/15  Yes Albertine GratesXu, Fang, MD  SUMAtriptan (IMITREX) 25 MG tablet Take 50 mg by mouth daily as needed for migraine. May repeat in 2 hours if headache persists or recurs.   Yes [provider]  traMADol (ULTRAM) 50 MG tablet Take by mouth every 12 (twelve) hours as needed.   Yes [provider]  traZODone (DESYREL) 150 MG tablet Take 75 mg by mouth at bedtime.    Yes  [provider]  acetaminophen (TYLENOL) 325 MG tablet Take 650 mg by mouth every 4 (four) hours as needed for mild pain, moderate pain, fever or headache.     [provider]  bisacodyl (DULCOLAX) 10 MG suppository Place 10 mg rectally daily as needed for moderate constipation.    [provider]  metaxalone (SKELAXIN) 800 MG tablet Take 800 mg by mouth 2 (two) times daily as needed for muscle spasms.    [provider]  Olopatadine HCl (PATADAY) 0.2 % SOLN Apply 1 drop to eye daily.    [provider]  promethazine (PHENERGAN) 25 MG tablet Take 25 mg by mouth every 6 (six) hours as needed for nausea or vomiting.    [provider]    Family History Family History  Problem  Relation Age of Onset  . Hypertension Mother   . Hypertension Son   . Diabetes Neg Hx   . Thyroid disease Neg Hx     Social History Social History  Substance Use Topics  . Smoking status: Never Smoker  . Smokeless tobacco: Never Used  . Alcohol use No     Allergies   Patient has no known allergies.   Review of Systems Review of Systems ROS: Statement: All systems negative except as marked or noted in the HPI; Constitutional: Negative for fever and chills. ; ; Eyes: Negative for eye pain, redness and discharge. ; ; ENMT: Negative for ear pain, hoarseness, nasal congestion, sinus pressure and sore throat. ; ; Cardiovascular: Negative for chest pain, palpitations, diaphoresis, dyspnea and peripheral edema. ; ; Respiratory: Negative for cough, wheezing and stridor. ; ; Gastrointestinal: Negative for nausea, vomiting, diarrhea, abdominal pain, blood in stool, hematemesis, jaundice and rectal bleeding. . ; ; Genitourinary: Negative for dysuria, flank pain and hematuria. ; ; Musculoskeletal: +head injury, back pain and neck pain. Negative for swelling and deformity.; ; Skin: Negative for pruritus, rash, abrasions, blisters, bruising and skin lesion.; ; Neuro: Negative  for headache, lightheadedness and neck stiffness. Negative for weakness, altered level of consciousness, altered mental status, extremity weakness, paresthesias, involuntary movement, seizure and syncope.      Physical Exam Updated Vital Signs BP 136/61   Pulse 61   Temp 98.1 F (36.7 C) (Oral)   Resp 16   SpO2 100%   Physical Exam 0820: Physical examination: Vital signs and O2 SAT: Reviewed; Constitutional: Well developed, Well nourished, Well hydrated, In no acute distress; Head and Face: Normocephalic, Atraumatic; Eyes: EOMI, PERRL, No scleral icterus; ENMT: Mouth and pharynx normal, Left TM normal, Right TM normal, Mucous membranes moist; Neck: Immobilized in C-collar, Trachea midline; Spine: No midline CS, TS, LS tenderness.; Cardiovascular: Regular rate and rhythm, No gallop; Respiratory: Breath sounds clear & equal bilaterally, No wheezes, Normal respiratory effort/excursion; Chest: Nontender, No deformity, Movement normal, No crepitus, No abrasions or ecchymosis.; Abdomen: Soft, Nontender, Nondistended, Normal bowel sounds, No abrasions or ecchymosis.; Genitourinary: No CVA tenderness;; Extremities: No deformity, Full range of motion major/large joints of bilat UE's and LE's without pain or tenderness to palp, Neurovascularly intact, Pulses normal, No tenderness, No edema, Pelvis stable; Neuro: AA&Ox3.  Major CN grossly intact. No facial droop. Speech clear. No gross focal motor or sensory deficits in extremities.; Skin: Color normal, Warm, Dry.   ED Treatments / Results  Labs (all labs ordered are listed, but only abnormal results are displayed)   EKG  EKG Interpretation None       Radiology   Procedures Procedures (including critical care time)  Medications Ordered in ED Medications - No data to display   Initial Impression / Assessment and Plan / ED Course  I have reviewed the triage vital signs and the nursing notes.  Pertinent labs & imaging results that were  available during my care of the patient were reviewed by me and considered in my medical decision making (see chart for details).  MDM Reviewed: previous chart, nursing note and vitals Interpretation: x-ray and CT scan   Dg Chest 2 View Result Date: 11/03/2016 CLINICAL DATA:  Post fall this morning, now with weakness. EXAM: CHEST  2 VIEW COMPARISON:  10/08/2015; chest CT - 03/09/2016 ; lumbar spine radiographs - earlier same day; FINDINGS: Grossly unchanged cardiac silhouette and mediastinal contours with nodular prominence of the right paratracheal stripe secondary to tortuous vascularity  as demonstrated on prior chest CT. Atherosclerosis within the thoracic and abdominal aorta. The lungs remain hyperexpanded with flattening the diaphragms and bibasilar linear heterogeneous opacities, left greater than right. No new focal airspace opacities. No pleural effusion or pneumothorax. No evidence of edema. Old fracture involving the posterolateral aspect of the left seventh rib. Post T12 and L1 vertebral body cement augmentation. Old moderate (approximately 40%) compression deformity involving the superior endplate of L1. No definite acute osseus abnormalities. IMPRESSION: 1. Hyperexpanded lungs and bibasilar atelectasis/scar without superimposed acute cardiopulmonary disease. 2.  Aortic Atherosclerosis (ICD10-I70.0). Electronically Signed   By: Simonne Come M.D.   On: 11/03/2016 09:31   Dg Lumbar Spine Complete Result Date: 11/03/2016 CLINICAL DATA:  Post fall this morning now with back pain and weakness. EXAM: LUMBAR SPINE - COMPLETE 4+ VIEW COMPARISON:  11/13/2015 FINDINGS: There are 5 non rib-bearing lumbar type vertebral bodies. Unchanged mild (approximately 3 mm) of anterolisthesis of L4 upon L5. No definite pars defects. Stable sequela of prior cement augmentation of the T12 and L2 vertebral bodies. Unchanged moderate (approximately 40%) compression deformity involving the superior endplate of L1.  Remaining lumbar vertebral body heights appear preserved. Mild multilevel lumbar spine DDD, likely worse at T12-L1 and L4-L5 with disc space height loss, endplate irregularity and sclerosis. Bilateral facet degenerative change within the lower lumbar spine. Limited visualization of the bilateral SI joints is normal. Exuberant calcifications are noted within the soft tissues of the buttocks bilaterally, unchanged. Calcified atherosclerotic plaque within the thoracic and abdominal aorta. IMPRESSION: 1. No acute findings. 2. Stable sequela of prior T12 and L2 vertebral body cement augmentation. 3. Unchanged moderate (approximately 40%) compression deformity involving the superior endplate of L1 4. Mild multilevel lumbar spine DDD, unchanged. 5.  Aortic Atherosclerosis (ICD10-I70.0). Electronically Signed   By: Simonne Come M.D.   On: 11/03/2016 09:28   Ct Head Wo Contrast Result Date: 11/03/2016 CLINICAL DATA:  Recent fall with headaches and neck pain, initial encounter EXAM: CT HEAD WITHOUT CONTRAST CT CERVICAL SPINE WITHOUT CONTRAST TECHNIQUE: Multidetector CT imaging of the head and cervical spine was performed following the standard protocol without intravenous contrast. Multiplanar CT image reconstructions of the cervical spine were also generated. COMPARISON:  03/14/2016 FINDINGS: CT HEAD FINDINGS Brain: Mild atrophic changes are noted. Scattered areas of decreased attenuation are noted consistent with chronic white matter ischemic change. No findings to suggest acute hemorrhage, acute infarction or space-occupying mass lesion are seen. Stable small right parafalcine meningioma is seen. Vascular: Mild ectasia of the basilar artery is noted. Skull: Normal. Negative for fracture or focal lesion. Sinuses/Orbits: No acute finding. Other: None. CT CERVICAL SPINE FINDINGS Alignment: Minimal anterolisthesis of C6 on C7 is noted. Skull base and vertebrae: 7 cervical segments are well visualized. Fusion at C7-T1 is  noted and stable. Vertebral body height is well maintained. No acute fracture or acute facet abnormality is noted. Multilevel facet hypertrophic changes are seen. Soft tissues and spinal canal: Thyroid gland is again mildly enlarged. No definitive mass lesion is seen. Carotid calcifications are noted. Disc levels:  No significant disc pathology is noted. Upper chest: Within normal limits. Other: None IMPRESSION: CT of the head: Chronic changes without acute abnormality. CT of the cervical spine: Degenerative change without acute abnormality. Electronically Signed   By: Alcide Clever M.D.   On: 11/03/2016 08:59   Ct Cervical Spine Wo Contrast Result Date: 11/03/2016 CLINICAL DATA:  Recent fall with headaches and neck pain, initial encounter EXAM: CT HEAD WITHOUT CONTRAST CT  CERVICAL SPINE WITHOUT CONTRAST TECHNIQUE: Multidetector CT imaging of the head and cervical spine was performed following the standard protocol without intravenous contrast. Multiplanar CT image reconstructions of the cervical spine were also generated. COMPARISON:  03/14/2016 FINDINGS: CT HEAD FINDINGS Brain: Mild atrophic changes are noted. Scattered areas of decreased attenuation are noted consistent with chronic white matter ischemic change. No findings to suggest acute hemorrhage, acute infarction or space-occupying mass lesion are seen. Stable small right parafalcine meningioma is seen. Vascular: Mild ectasia of the basilar artery is noted. Skull: Normal. Negative for fracture or focal lesion. Sinuses/Orbits: No acute finding. Other: None. CT CERVICAL SPINE FINDINGS Alignment: Minimal anterolisthesis of C6 on C7 is noted. Skull base and vertebrae: 7 cervical segments are well visualized. Fusion at C7-T1 is noted and stable. Vertebral body height is well maintained. No acute fracture or acute facet abnormality is noted. Multilevel facet hypertrophic changes are seen. Soft tissues and spinal canal: Thyroid gland is again mildly enlarged. No  definitive mass lesion is seen. Carotid calcifications are noted. Disc levels:  No significant disc pathology is noted. Upper chest: Within normal limits. Other: None IMPRESSION: CT of the head: Chronic changes without acute abnormality. CT of the cervical spine: Degenerative change without acute abnormality. Electronically Signed   By: Alcide Clever M.D.   On: 11/03/2016 08:59    1015:  CT/XR assuring. Will d/c back to NH.    Final Clinical Impressions(s) / ED Diagnoses   Final diagnoses:  None    New Prescriptions New Prescriptions   No medications on file     Samuel Jester, DO 11/07/16 1529

## 2016-11-03 NOTE — ED Triage Notes (Signed)
[facility] for unwitnessed [facility] this morning when patient tried getting up to go to restroom.  Patient c/o neck and back pain.  Patient denies LOC.  Vitals: 132/68, 60HR, 16R, CBG 96.

## 2016-11-03 NOTE — ED Notes (Signed)
[facility] notified about [facility].

## 2016-11-03 NOTE — ED Notes (Signed)
Patient's son here in ED and states that he will transport her back to SNF.

## 2016-11-03 NOTE — Discharge Instructions (Signed)
Take your usual prescriptions as previously directed.  Call your regular medical doctor on Monday to schedule a follow up appointment within the next 3 days.  Return to the Emergency Department immediately sooner if worsening.  ° °

## 2016-11-03 NOTE — ED Notes (Signed)
Bed: [id number] Expected date: 11/03/16 Expected time: 7:33 AM Means of arrival: Ambulance Comments: Fall

## 2016-11-06 ENCOUNTER — Other Ambulatory Visit: Payer: Self-pay | Admitting: Internal Medicine

## 2016-11-09 ENCOUNTER — Other Ambulatory Visit: Payer: Self-pay | Admitting: Internal Medicine

## 2016-11-09 DIAGNOSIS — Z9889 Other specified postprocedural states: Secondary | ICD-10-CM

## 2016-11-09 DIAGNOSIS — S32050S Wedge compression fracture of fifth lumbar vertebra, sequela: Secondary | ICD-10-CM

## 2016-11-22 ENCOUNTER — Other Ambulatory Visit: Payer: Medicare Other

## 2016-11-27 ENCOUNTER — Encounter: Payer: Self-pay | Admitting: Endocrinology

## 2016-11-27 ENCOUNTER — Ambulatory Visit (INDEPENDENT_AMBULATORY_CARE_PROVIDER_SITE_OTHER): Payer: Medicare Other | Admitting: Endocrinology

## 2016-11-27 ENCOUNTER — Ambulatory Visit: Payer: Medicare Other | Admitting: Endocrinology

## 2016-11-27 VITALS — BP 142/82 | HR 70 | Wt 147.2 lb

## 2016-11-27 DIAGNOSIS — E0590 Thyrotoxicosis, unspecified without thyrotoxic crisis or storm: Secondary | ICD-10-CM | POA: Diagnosis not present

## 2016-11-27 LAB — T4, FREE: FREE T4: 0.61 ng/dL (ref 0.60–1.60)

## 2016-11-27 LAB — TSH: TSH: 2.95 u[IU]/mL (ref 0.35–4.50)

## 2016-11-27 NOTE — Progress Notes (Signed)
Subjective:    Patient ID: Paula Bird, female    DOB: [birth year], 83 y.o.   MRN: [id number]  HPI Pt returns for f/u of autoimmune thyroid dz (dx'ed 2016; characterized by alternating hyper- and hypothyroidism; she has been on amiodarone since 2013; she was rx'ed tapazole in 2016, for a brief time, but this was changed to synthroid in early 2017; this was stopped in Aug of 2017, for a suppressed TSH, and tapazole was started; dtr provides hx, as pt has memory loss; [facility]  dtr says pt feels well in general.   Past Medical History:  Diagnosis Date  . Acute and chronic respiratory failure, unspecified whether with hypoxia or hypercapnia (HCC)   . Afib (HCC)   . Allergic rhinitis   . Anemia   . CKD (chronic kidney disease)   . Constipation   . COPD (chronic obstructive pulmonary disease) (HCC)   . Depression   . Diplopia   . Dysphagia   . Edema, lower extremity   . Esophageal dysmotility   . Gait instability   . GERD (gastroesophageal reflux disease)   . Hypertension   . Hypothyroidism   . IBS (irritable bowel syndrome)   . IDA (iron deficiency anemia)   . Insomnia   . Leukocytosis   . Myasthenia gravis (HCC)   . Oral thrush   . PAF (paroxysmal atrial fibrillation) (HCC)   . Physical deconditioning   . Protein calorie malnutrition (HCC)   . Thyroid disease   . Thyroid mass   . Vertigo     Past Surgical History:  Procedure Laterality Date  . ABDOMINAL HYSTERECTOMY    . APPENDECTOMY    . TOTAL HIP ARTHROPLASTY Left   . TOTAL KNEE ARTHROPLASTY Left     Social History   Social History  . Marital status: Widowed    Spouse name: N/A  . Number of children: 2  . Years of education: HS   Occupational History  . Retired    Social History Main Topics  . Smoking status: Never Smoker  . Smokeless tobacco: Never Used  . Alcohol use No  . Drug use: No  . Sexual activity: Not on file   Other Topics Concern  . Not on file   Social  History Narrative   [facility]   Left-handed.   2 cups caffeine per day.    Current Outpatient Prescriptions on File Prior to Visit  Medication Sig Dispense Refill  . acetaminophen (TYLENOL) 325 MG tablet Take 650 mg by mouth every 4 (four) hours as needed for mild pain, moderate pain, fever or headache.     Marland Kitchen. amiodarone (PACERONE) 200 MG tablet Take 200 mg by mouth daily.     Marland Kitchen. amitriptyline (ELAVIL) 75 MG tablet Take 75 mg by mouth at bedtime.  10  . aspirin EC 81 MG tablet Take 81 mg by mouth daily.    . bisacodyl (DULCOLAX) 10 MG suppository Place 10 mg rectally daily as needed for moderate constipation.    . Cholecalciferol (VITAMIN D3) 2000 units TABS Take 1 tablet by mouth daily.    . Cranberry 425 MG CAPS Take 425 mg by mouth 2 (two) times daily.    Marland Kitchen. diltiazem (CARDIZEM CD) 300 MG 24 hr capsule Take 300 mg by mouth daily.  10  . esomeprazole (NEXIUM) 20 MG capsule Take 20 mg by mouth 2 (two) times daily.     . hydrochlorothiazide (HYDRODIURIL) 25 MG tablet  Take 1 tablet (25 mg total) by mouth every Monday, Wednesday, and Friday. 30 tablet 0  . HYDROcodone-acetaminophen (NORCO/VICODIN) 5-325 MG tablet Take 1 tablet by mouth 3 (three) times daily. For pain  0  . lisinopril (PRINIVIL,ZESTRIL) 40 MG tablet Take 40 mg by mouth daily.  10  . meloxicam (MOBIC) 7.5 MG tablet Take 7.5 mg by mouth daily.  10  . metaxalone (SKELAXIN) 800 MG tablet Take 800 mg by mouth 2 (two) times daily as needed for muscle spasms.    . methimazole (TAPAZOLE) 5 MG tablet Take 1 tablet (5 mg total) by mouth daily. 30 tablet 11  . Multiple Vitamins-Minerals (MULTIVITAMIN GUMMIES ADULT PO) Take 1 tablet by mouth daily.    . mycophenolate (CELLCEPT) 500 MG tablet Take 1 tablet (500 mg total) by mouth 2 (two) times daily. 60 tablet 11  . Olopatadine HCl (PATADAY) 0.2 % SOLN Apply 1 drop to eye daily.    Marland Kitchen PARoxetine (PAXIL) 40 MG tablet Take 40 mg by mouth daily.  3  . polyethylene  glycol (MIRALAX / GLYCOLAX) packet Take 17 g by mouth daily. (Patient taking differently: Take 17 g by mouth 2 (two) times daily. ) 14 each 12  . polyvinyl alcohol (LIQUIFILM TEARS) 1.4 % ophthalmic solution Place 1 drop into both eyes 4 (four) times daily as needed for dry eyes.    . promethazine (PHENERGAN) 25 MG tablet Take 25 mg by mouth every 6 (six) hours as needed for nausea or vomiting.    Marland Kitchen QUEtiapine (SEROQUEL) 25 MG tablet Take 12.5 mg by mouth at bedtime. Taking 0.5 - 1 tablet at bedtime.     . sennosides-docusate sodium (SENOKOT-S) 8.6-50 MG tablet Take 1 tablet by mouth 2 (two) times daily. 30 tablet 0  . SUMAtriptan (IMITREX) 25 MG tablet Take 50 mg by mouth daily as needed for migraine. May repeat in 2 hours if headache persists or recurs.    . traMADol (ULTRAM) 50 MG tablet Take by mouth every 12 (twelve) hours as needed.    . traZODone (DESYREL) 150 MG tablet Take 75 mg by mouth at bedtime.      No current facility-administered medications on file prior to visit.     No Known Allergies  Family History  Problem Relation Age of Onset  . Hypertension Mother   . Hypertension Son   . Diabetes Neg Hx   . Thyroid disease Neg Hx     BP (!) 142/82   Pulse 70   Wt 147 lb 3.2 oz (66.8 kg)   SpO2 95%   BMI 27.81 kg/m    Review of Systems Denies fever    Objective:   Physical Exam Vital signs: see vs page Gen: elderly, frail, no distress.  In wheelchair NECK: thyroid is approx 3 times normal size, diffuse.  No palpable nodule.   Neuro: coarse tremor of the hands.  SKIN: not diaphoretic.      Assessment & Plan:  Hyperthyroidism: due for recheck.    Patient Instructions  blood tests are requested for you today.  We'll let you know about the results. If ever you have fever while taking methimazole, stop it and call us, even if the reason is obvious, because of the risk of a rare side-effect.  You do not need to stop taking the amiodarone--we'll work around that.    Please come back for a follow-up appointment in 4 months.

## 2016-11-27 NOTE — Patient Instructions (Signed)
blood tests are requested for you today.  We'll let you know about the results. If ever you have fever while taking methimazole, stop it and call us, even if the reason is obvious, because of the risk of a rare side-effect.  You do not need to stop taking the amiodarone--we'll work around that.  Please come back for a follow-up appointment in 4 months.

## 2016-12-20 ENCOUNTER — Other Ambulatory Visit: Payer: Self-pay | Admitting: Internal Medicine

## 2016-12-20 DIAGNOSIS — Z9889 Other specified postprocedural states: Secondary | ICD-10-CM

## 2017-01-08 ENCOUNTER — Ambulatory Visit
Admission: RE | Admit: 2017-01-08 | Discharge: 2017-01-08 | Disposition: A | Discharge status: Home / Self Care | Payer: Medicare Other | Source: Ambulatory Visit | Attending: Internal Medicine | Admitting: Internal Medicine

## 2017-01-08 DIAGNOSIS — Z9889 Other specified postprocedural states: Secondary | ICD-10-CM

## 2017-02-23 DIAGNOSIS — J189 Pneumonia, unspecified organism: Secondary | ICD-10-CM

## 2017-02-23 HISTORY — DX: Pneumonia, unspecified organism: J18.9

## 2017-03-04 ENCOUNTER — Inpatient Hospital Stay (HOSPITAL_COMMUNITY)
Admission: EM | Admit: 2017-03-04 | Discharge: 2017-03-13 | DRG: 193 | Disposition: A | Discharge status: Nursing Home (Includes ICF) | Payer: Medicare Other | Attending: Internal Medicine | Admitting: Internal Medicine

## 2017-03-04 ENCOUNTER — Other Ambulatory Visit: Payer: Self-pay

## 2017-03-04 ENCOUNTER — Emergency Department (HOSPITAL_COMMUNITY): Payer: Medicare Other

## 2017-03-04 ENCOUNTER — Encounter (HOSPITAL_COMMUNITY): Payer: Self-pay | Admitting: Emergency Medicine

## 2017-03-04 DIAGNOSIS — Z90710 Acquired absence of both cervix and uterus: Secondary | ICD-10-CM

## 2017-03-04 DIAGNOSIS — Z8701 Personal history of pneumonia (recurrent): Secondary | ICD-10-CM

## 2017-03-04 DIAGNOSIS — Y95 Nosocomial condition: Secondary | ICD-10-CM | POA: Diagnosis present

## 2017-03-04 DIAGNOSIS — I10 Essential (primary) hypertension: Secondary | ICD-10-CM | POA: Diagnosis present

## 2017-03-04 DIAGNOSIS — Z96642 Presence of left artificial hip joint: Secondary | ICD-10-CM | POA: Diagnosis present

## 2017-03-04 DIAGNOSIS — Z79899 Other long term (current) drug therapy: Secondary | ICD-10-CM

## 2017-03-04 DIAGNOSIS — J122 Parainfluenza virus pneumonia: Principal | ICD-10-CM | POA: Diagnosis present

## 2017-03-04 DIAGNOSIS — Z8249 Family history of ischemic heart disease and other diseases of the circulatory system: Secondary | ICD-10-CM

## 2017-03-04 DIAGNOSIS — J189 Pneumonia, unspecified organism: Secondary | ICD-10-CM

## 2017-03-04 DIAGNOSIS — G4700 Insomnia, unspecified: Secondary | ICD-10-CM | POA: Diagnosis present

## 2017-03-04 DIAGNOSIS — Z66 Do not resuscitate: Secondary | ICD-10-CM | POA: Diagnosis present

## 2017-03-04 DIAGNOSIS — Z7982 Long term (current) use of aspirin: Secondary | ICD-10-CM

## 2017-03-04 DIAGNOSIS — J440 Chronic obstructive pulmonary disease with acute lower respiratory infection: Secondary | ICD-10-CM | POA: Diagnosis present

## 2017-03-04 DIAGNOSIS — K589 Irritable bowel syndrome without diarrhea: Secondary | ICD-10-CM | POA: Diagnosis present

## 2017-03-04 DIAGNOSIS — Z9089 Acquired absence of other organs: Secondary | ICD-10-CM

## 2017-03-04 DIAGNOSIS — I480 Paroxysmal atrial fibrillation: Secondary | ICD-10-CM | POA: Diagnosis present

## 2017-03-04 DIAGNOSIS — R0902 Hypoxemia: Secondary | ICD-10-CM

## 2017-03-04 DIAGNOSIS — F329 Major depressive disorder, single episode, unspecified: Secondary | ICD-10-CM | POA: Diagnosis present

## 2017-03-04 DIAGNOSIS — J441 Chronic obstructive pulmonary disease with (acute) exacerbation: Secondary | ICD-10-CM | POA: Diagnosis present

## 2017-03-04 DIAGNOSIS — Z7951 Long term (current) use of inhaled steroids: Secondary | ICD-10-CM

## 2017-03-04 DIAGNOSIS — B348 Other viral infections of unspecified site: Secondary | ICD-10-CM | POA: Diagnosis present

## 2017-03-04 DIAGNOSIS — Z96652 Presence of left artificial knee joint: Secondary | ICD-10-CM | POA: Diagnosis present

## 2017-03-04 DIAGNOSIS — F32A Depression, unspecified: Secondary | ICD-10-CM | POA: Diagnosis present

## 2017-03-04 DIAGNOSIS — K219 Gastro-esophageal reflux disease without esophagitis: Secondary | ICD-10-CM | POA: Diagnosis present

## 2017-03-04 DIAGNOSIS — G7000 Myasthenia gravis without (acute) exacerbation: Secondary | ICD-10-CM | POA: Diagnosis present

## 2017-03-04 DIAGNOSIS — J9621 Acute and chronic respiratory failure with hypoxia: Secondary | ICD-10-CM | POA: Diagnosis present

## 2017-03-04 DIAGNOSIS — E0590 Thyrotoxicosis, unspecified without thyrotoxic crisis or storm: Secondary | ICD-10-CM | POA: Diagnosis present

## 2017-03-04 LAB — CBC WITH DIFFERENTIAL/PLATELET
BASOS ABS: 0 10*3/uL (ref 0.0–0.1)
BASOS PCT: 0 %
EOS ABS: 0.1 10*3/uL (ref 0.0–0.7)
EOS PCT: 1 %
HEMATOCRIT: 39.6 % (ref 36.0–46.0)
Hemoglobin: 12.7 g/dL (ref 12.0–15.0)
Lymphocytes Relative: 17 %
Lymphs Abs: 1.4 10*3/uL (ref 0.7–4.0)
MCH: 29.1 pg (ref 26.0–34.0)
MCHC: 32.1 g/dL (ref 30.0–36.0)
MCV: 90.8 fL (ref 78.0–100.0)
MONO ABS: 0.7 10*3/uL (ref 0.1–1.0)
MONOS PCT: 8 %
Neutro Abs: 6.2 10*3/uL (ref 1.7–7.7)
Neutrophils Relative %: 74 %
PLATELETS: 157 10*3/uL (ref 150–400)
RBC: 4.36 MIL/uL (ref 3.87–5.11)
RDW: 14.2 % (ref 11.5–15.5)
WBC: 8.4 10*3/uL (ref 4.0–10.5)

## 2017-03-04 LAB — BASIC METABOLIC PANEL
Anion gap: 7 (ref 5–15)
BUN: 36 mg/dL — ABNORMAL HIGH (ref 6–20)
CHLORIDE: 103 mmol/L (ref 101–111)
CO2: 25 mmol/L (ref 22–32)
CREATININE: 0.89 mg/dL (ref 0.44–1.00)
Calcium: 9 mg/dL (ref 8.9–10.3)
GFR, EST NON AFRICAN AMERICAN: 56 mL/min — AB (ref >60)
Glucose, Bld: 107 mg/dL — ABNORMAL HIGH (ref 65–99)
Potassium: 4.1 mmol/L (ref 3.5–5.1)
SODIUM: 135 mmol/L (ref 135–145)

## 2017-03-04 LAB — I-STAT CG4 LACTIC ACID, ED: LACTIC ACID, VENOUS: 0.56 mmol/L (ref 0.5–1.9)

## 2017-03-04 LAB — I-STAT TROPONIN, ED: TROPONIN I, POC: 0 ng/mL (ref 0.00–0.08)

## 2017-03-04 MED ORDER — VANCOMYCIN HCL IN DEXTROSE 1-5 GM/200ML-% IV SOLN
1000.0000 mg | Freq: Once | INTRAVENOUS | Status: DC
  Administered 2017-03-05: 1000 mg via INTRAVENOUS
  Filled 2017-03-04: qty 200

## 2017-03-04 MED ORDER — DEXTROSE 5 % IV SOLN
2.0000 g | Freq: Once | INTRAVENOUS | Status: AC
  Administered 2017-03-04: 2 g via INTRAVENOUS
  Filled 2017-03-04: qty 2

## 2017-03-04 NOTE — ED Notes (Signed)
Bed: [id number] Expected date:  Expected time:  Means of arrival:  Comments: 83 yr old SOB, bronchitis

## 2017-03-04 NOTE — Progress Notes (Signed)
A consult was received from an ED physician for Cefepime and Vancomycin per pharmacy dosing.  The patient's profile has been reviewed for ht/wt/allergies/indication/available labs.   A one time order has been placed for Cefepime 2gm and Vancomycin 1gm.  Further antibiotics/pharmacy consults should be ordered by admitting physician if indicated.                       Thank you, Otho BellowsGreen, Arsalan Brisbin L 03/04/2017  11:21 PM

## 2017-03-04 NOTE — ED Triage Notes (Signed)
Pt arriving from [facility] with complaints of shortness of breath. Pt recently diagnosed with chronic bronchitis. Pt was prescribed Levaquin on the 6th but reports she is not getting better. Pt has on and off nonproductive cough. Pt currently on 2L via nasal canula, but does not where oxygen on regular basis. Pt A&O x4.  HR 64 134/70

## 2017-03-04 NOTE — ED Provider Notes (Signed)
[facility]-EMERGENCY DEPT Provider Note   CSN: [id number] Arrival date & time: 03/04/17  2147     History   Chief Complaint Chief Complaint  Patient presents with  . Shortness of Breath    HPI Paula Bird is a 83 y.o. female.  The history is provided by the patient and medical records.  Shortness of Breath  Associated symptoms include cough.    83 y.o. F with hx of acute on chronic respiratory failure, afib on ASA only, allergies, COPD, CKD< depression, GERD, HTN, hypothyroidism, IBS, thyroid disease, myasthenia gravis, presenting to the ED with shortness of breath.  Patient is a resident at [facility].  States she has been sick for about 10 days or so.  Her doctor prescribed her Levaquin which she has been taking for about 4 days now without any acute changes.  States she continues to have intermittently productive cough with thick, white sputum.  States sometimes she has to reach into her throat to pull out excess mucus that she cannot cough up because she feels like it "chokes her up".  States she has had some chills but unsure of fever.  States she has not had much of an appetite and has not had very much fluids at all.  She has not had any abdominal pain, nausea, or vomiting.  Patient states about once a year she gets really sick like this, occasionally requiring admission.  States this is been ongoing for the past 10 years or so.  Patient was hypoxic with EMS, placed on 2 L with improvement.  Generally does not require home oxygen.  Past Medical History:  Diagnosis Date  . Acute and chronic respiratory failure, unspecified whether with hypoxia or hypercapnia (HCC)   . Afib (HCC)   . Allergic rhinitis   . Anemia   . CKD (chronic kidney disease)   . Constipation   . COPD (chronic obstructive pulmonary disease) (HCC)   . Depression   . Diplopia   . Dysphagia   . Edema, lower extremity   . Esophageal dysmotility   . Gait instability   .  GERD (gastroesophageal reflux disease)   . Hypertension   . Hypothyroidism   . IBS (irritable bowel syndrome)   . IDA (iron deficiency anemia)   . Insomnia   . Leukocytosis   . Myasthenia gravis (HCC)   . Oral thrush   . PAF (paroxysmal atrial fibrillation) (HCC)   . Physical deconditioning   . Protein calorie malnutrition (HCC)   . Thyroid disease   . Thyroid mass   . Vertigo     Patient Active Problem List   Diagnosis Date Noted  . Gait abnormality 06/07/2016  . Hyperthyroidism 04/24/2016  . Myasthenia gravis (HCC) 02/07/2016  . Ptosis of eyelid, right 01/31/2016  . Pain, eye, right 01/31/2016  . Falling 01/31/2016  . Vertigo 10/08/2015  . Colon polyp 08/30/2015  . Arthritis, degenerative 08/30/2015  . Acid indigestion 08/30/2015  . Psoriasis 08/30/2015  . Avitaminosis D 08/30/2015  . HCAP (healthcare-associated pneumonia) 08/08/2015  . Respiratory distress 08/08/2015  . Essential hypertension 08/08/2015  . COPD exacerbation (HCC) 08/08/2015  . Dyspnea 08/04/2015  . Hypertension 08/04/2015  . Depression 08/04/2015  . Insomnia 08/04/2015  . Obstructive chronic bronchitis with acute exacerbation (HCC) 08/04/2015  . Thrombocytopenia (HCC) 08/04/2015  . Paroxysmal atrial fibrillation (HCC) 08/04/2015  . Acute exacerbation of chronic obstructive bronchitis (HCC) 08/04/2015  . Infection of urinary tract 07/01/2014  . Bradycardia 11/24/2013  .  Fall in home 11/22/2013  . Pulmonary hypertension (HCC) 11/19/2013  . Excessive falling 09/07/2012  . Encounter for general adult medical examination without abnormal findings 03/24/2012  . Atrial paroxysmal tachycardia (HCC) 10/05/2011  . Family history of colon cancer 05/15/2011  . Mass of pelvis 05/15/2011    Past Surgical History:  Procedure Laterality Date  . ABDOMINAL HYSTERECTOMY    . APPENDECTOMY    . TOTAL HIP ARTHROPLASTY Left   . TOTAL KNEE ARTHROPLASTY Left     OB History    No data available       Home  Medications    Prior to Admission medications   Medication Sig Start Date End Date Taking? Authorizing Provider  acetaminophen (TYLENOL) 325 MG tablet Take 650 mg by mouth every 4 (four) hours as needed for mild pain, moderate pain, fever or headache.     [provider]  amiodarone (PACERONE) 200 MG tablet Take 200 mg by mouth daily.     [provider]  amitriptyline (ELAVIL) 75 MG tablet Take 75 mg by mouth at bedtime. 07/12/15   [provider]  aspirin EC 81 MG tablet Take 81 mg by mouth daily.    [provider]  bisacodyl (DULCOLAX) 10 MG suppository Place 10 mg rectally daily as needed for moderate constipation.    [provider]  Cholecalciferol (VITAMIN D3) 2000 units TABS Take 1 tablet by mouth daily.    [provider]  Cranberry 425 MG CAPS Take 425 mg by mouth 2 (two) times daily.    [provider]  diltiazem (CARDIZEM CD) 300 MG 24 hr capsule Take 300 mg by mouth daily. 03/04/16   [provider]  esomeprazole (NEXIUM) 20 MG capsule Take 20 mg by mouth 2 (two) times daily.     [provider]  hydrochlorothiazide (HYDRODIURIL) 25 MG tablet Take 1 tablet (25 mg total) by mouth every Monday, Wednesday, and Friday. 08/16/15   Albertine GratesXu, Fang, MD  HYDROcodone-acetaminophen (NORCO/VICODIN) 5-325 MG tablet Take 1 tablet by mouth 3 (three) times daily. For pain 10/31/16   [provider]  lisinopril (PRINIVIL,ZESTRIL) 40 MG tablet Take 40 mg by mouth daily. 03/04/16   [provider]  meloxicam (MOBIC) 7.5 MG tablet Take 7.5 mg by mouth daily. 10/25/16   [provider]  metaxalone (SKELAXIN) 800 MG tablet Take 800 mg by mouth 2 (two) times daily as needed for muscle spasms.    [provider]  methimazole (TAPAZOLE) 5 MG tablet Take 1 tablet (5 mg total) by mouth daily. 07/24/16   Romero BellingEllison, Sean, MD  Multiple Vitamins-Minerals (MULTIVITAMIN GUMMIES ADULT PO) Take 1 tablet by mouth daily.     [provider]  mycophenolate (CELLCEPT) 500 MG tablet Take 1 tablet (500 mg total) by mouth 2 (two) times daily. 10/16/16   Levert FeinsteinYan, Yijun, MD  Olopatadine HCl (PATADAY) 0.2 % SOLN Apply 1 drop to eye daily.    [provider]  PARoxetine (PAXIL) 40 MG tablet Take 40 mg by mouth daily. 07/23/15   [provider]  polyethylene glycol (MIRALAX / GLYCOLAX) packet Take 17 g by mouth daily. Patient taking differently: Take 17 g by mouth 2 (two) times daily.  08/27/14   Arthor CaptainHarris, Abigail, PA-C  polyvinyl alcohol (LIQUIFILM TEARS) 1.4 % ophthalmic solution Place 1 drop into both eyes 4 (four) times daily as needed for dry eyes.    [provider]  promethazine (PHENERGAN) 25 MG tablet Take 25 mg by mouth every 6 (  six) hours as needed for nausea or vomiting.    [provider]  QUEtiapine (SEROQUEL) 25 MG tablet Take 12.5 mg by mouth at bedtime. Taking 0.5 - 1 tablet at bedtime.     [provider]  sennosides-docusate sodium (SENOKOT-S) 8.6-50 MG tablet Take 1 tablet by mouth 2 (two) times daily. 08/16/15   [name], MD  SUMAtriptan (IMITREX) 25 MG tablet Take 50 mg by mouth daily as needed for migraine. May repeat in 2 hours if headache persists or recurs.    [provider]  traMADol (ULTRAM) 50 MG tablet Take by mouth every 12 (twelve) hours as needed.    [provider]  traZODone (DESYREL) 150 MG tablet Take 75 mg by mouth at bedtime.     [provider]    Family History Family History  Problem Relation Age of Onset  . Hypertension Mother   . Hypertension Son   . Diabetes Neg Hx   . Thyroid disease Neg Hx     Social History Social History   Tobacco Use  . Smoking status: Never Smoker  . Smokeless tobacco: Never Used  Substance Use Topics  . Alcohol use: No  . Drug use: No     Allergies   Patient has no known allergies.   Review of Systems Review of Systems  Respiratory: Positive for cough and shortness  of breath.   All other systems reviewed and are negative.    Physical Exam Updated Vital Signs There were no vitals taken for this visit.  Physical Exam  Constitutional: She is oriented to person, place, and time. She appears well-developed and well-nourished.  Elderly, NAD  HENT:  Head: Normocephalic and atraumatic.  Mouth/Throat: Oropharynx is clear and moist.  Eyes: Conjunctivae and EOM are normal. Pupils are equal, round, and reactive to light.  Neck: Normal range of motion.  Cardiovascular: Normal rate, regular rhythm and normal heart sounds.  Pulmonary/Chest: Effort normal. She has no wheezes. She has rhonchi. She has no rales.  Rhonchi noted, slightly more pronounced on left; able to speak in short sentences, no significant tachypnea, O2 sats 91% on RA, 95% with 2L supplemental oxygen  Abdominal: Soft. Bowel sounds are normal.  Musculoskeletal: Normal range of motion.  Neurological: She is alert and oriented to person, place, and time.  Skin: Skin is warm and dry.  Psychiatric: She has a normal mood and affect.  Nursing note and vitals reviewed.    ED Treatments / Results  Labs (all labs ordered are listed, but only abnormal results are displayed) Labs Reviewed  BASIC METABOLIC PANEL - Abnormal; Notable for the following components:      Result Value   Glucose, Bld 107 (*)    BUN 36 (*)    GFR calc non Af Amer 56 (*)    All other components within normal limits  CBC - Abnormal; Notable for the following components:   RBC 3.57 (*)    Hemoglobin 10.4 (*)    HCT 33.4 (*)    Platelets 140 (*)    All other components within normal limits  CULTURE, BLOOD (ROUTINE X 2)  CULTURE, BLOOD (ROUTINE X 2)  CULTURE, EXPECTORATED SPUTUM-ASSESSMENT  GRAM STAIN  CBC WITH DIFFERENTIAL/PLATELET  HIV ANTIBODY (ROUTINE TESTING)  STREP PNEUMONIAE URINARY ANTIGEN  BASIC METABOLIC PANEL  CBC  LEGIONELLA PNEUMOPHILA SEROGP 1 UR AG  INFLUENZA PANEL BY PCR (TYPE A & B)  CREATININE,  SERUM  I-STAT CG4 LACTIC ACID, ED  I-STAT TROPONIN, ED  EKG  EKG Interpretation  Date/Time:  Monday March 04 2017 22:14:51 EST Ventricular Rate:  62 PR Interval:    QRS Duration: 90 QT Interval:  422 QTC Calculation: 429 R Axis:   -8 Text Interpretation:  Junctional rhythm Probable anteroseptal infarct, old When compared with ECG of 10/08/2015, No significant change was found Confirmed by Dione Booze ([id number]) on 03/04/2017 11:38:48 PM       Radiology Dg Chest 2 View  Result Date: 03/04/2017 CLINICAL DATA:  Worsening cough and dyspnea. EXAM: CHEST  2 VIEW COMPARISON:  Chest CT 02/28/2016 and CXR 11/03/2016, lumbar spine radiographs from 11/03/2016. FINDINGS: Stable cardiomegaly with aortic atherosclerosis. Right paratracheal soft tissue prominence is stable, consistent with a tortuous ectatic right subclavian artery on prior chest CT. Minimal airspace opacity at the left lung base overlying the costophrenic angle may represent a tiny focus of pneumonia or atelectasis. Old left posterior seventh rib fracture. Vertebral augmentation T12 and L2 with chronic stable moderate compression of L1. IMPRESSION: 1. Cardiomegaly with aortic atherosclerosis. 2. Small focus of airspace opacity in the left lung base suspicious for atelectasis and/or pneumonia. 3. Vertebral augmentation T12 and L2 with chronic moderate compression of L1. Electronically Signed   By: Tollie Eth M.D.   On: 03/04/2017 22:57    Procedures Procedures (including critical care time)  Medications Ordered in ED Medications  linezolid (ZYVOX) IVPB 600 mg (600 mg Intravenous New Bag/Given 03/05/17 0145)  amiodarone (PACERONE) tablet 200 mg (not administered)  amitriptyline (ELAVIL) tablet 75 mg (not administered)  aspirin EC tablet 81 mg (not administered)  bisacodyl (DULCOLAX) suppository 10 mg (not administered)  Vitamin D3 TABS 1 tablet (not administered)  Cranberry CAPS 425 mg (not administered)  diltiazem  (CARDIZEM CD) 24 hr capsule 300 mg (not administered)  pantoprazole (PROTONIX) EC tablet 40 mg (not administered)  hydrochlorothiazide (HYDRODIURIL) tablet 25 mg (not administered)  HYDROcodone-acetaminophen (NORCO/VICODIN) 5-325 MG per tablet 1 tablet (not administered)  lisinopril (PRINIVIL,ZESTRIL) tablet 40 mg (not administered)  metaxalone (SKELAXIN) tablet 800 mg (not administered)  methimazole (TAPAZOLE) tablet 5 mg (not administered)  mycophenolate (CELLCEPT) tablet 500 mg (not administered)  PARoxetine (PAXIL) tablet 40 mg (not administered)  polyethylene glycol (MIRALAX / GLYCOLAX) packet 17 g (not administered)  promethazine (PHENERGAN) tablet 25 mg (not administered)  QUEtiapine (SEROQUEL) tablet 12.5 mg (not administered)  sennosides-docusate sodium (SENOKOT-S) 8.6-50 MG tablet 1 tablet (not administered)  traMADol (ULTRAM) tablet 50 mg (not administered)  traZODone (DESYREL) tablet 75 mg (not administered)  acetaminophen (TYLENOL) tablet 650 mg (not administered)    Or  acetaminophen (TYLENOL) suppository 650 mg (not administered)  ondansetron (ZOFRAN) tablet 4 mg (not administered)    Or  ondansetron (ZOFRAN) injection 4 mg (not administered)  ceFEPIme (MAXIPIME) 1 g in dextrose 5 % 50 mL IVPB (not administered)  enoxaparin (LOVENOX) injection 40 mg (not administered)  levalbuterol (XOPENEX) nebulizer solution 0.63 mg (not administered)  ceFEPIme (MAXIPIME) 2 g in dextrose 5 % 50 mL IVPB (0 g Intravenous Stopped 03/05/17 0020)     Initial Impression / Assessment and Plan / ED Course  I have reviewed the triage vital signs and the nursing notes.  Pertinent labs & imaging results that were available during my care of the patient were reviewed by me and considered in my medical decision making (see chart for details).  83 year old female here with shortness of breath.  Has been on Levaquin for 4 days now without significant relief.  She was hypoxic in the field, improved  with 2 L supplemental oxygen here.  She does have rhonchi on exam, slightly more pronounced on the left.  She is in no acute distress at this time.  She does have history of myasthenia gravis, currently on CellCept.  We will plan for chest x-ray, screening labs, blood cultures.  Patient's chest x-ray with new left lower infiltrate.  Lactate is normal.  White blood cell count is normal.  Given her pneumonia found on x-ray and oxygen requirement, she will require admission.  She has failed treatment with Levaquin so will start on Vanco/cefepime.  She is on immunosuppressants for her myasthenia gravis, blood cultures have been sent.  She remains without any acute distress at this time.  Patient updated with care plan and is in agreement.  Discussed with Dr. Toniann Fail-- some concerns over Vancomycin use given her history of myasthenia gravis.  I have spoken with pharmacy about this, given her worsening condition and use of immune suppressants she does require broader coverage.  Several other antibiotics also with warning and given that she has failed levaquin already, vancomyin is still our best option at this time.  Will start for one dose and watch closely.  Final Clinical Impressions(s) / ED Diagnoses   Final diagnoses:  HCAP (healthcare-associated pneumonia)  Hypoxia    ED Discharge Orders    None       Garlon Hatchet, PA-C 03/05/17 0159    Wynetta Fines, MD 03/05/17 [phone]

## 2017-03-05 ENCOUNTER — Encounter (HOSPITAL_COMMUNITY): Payer: Self-pay | Admitting: Internal Medicine

## 2017-03-05 DIAGNOSIS — I480 Paroxysmal atrial fibrillation: Secondary | ICD-10-CM | POA: Diagnosis present

## 2017-03-05 DIAGNOSIS — Z66 Do not resuscitate: Secondary | ICD-10-CM | POA: Diagnosis present

## 2017-03-05 DIAGNOSIS — E0590 Thyrotoxicosis, unspecified without thyrotoxic crisis or storm: Secondary | ICD-10-CM | POA: Diagnosis present

## 2017-03-05 DIAGNOSIS — K219 Gastro-esophageal reflux disease without esophagitis: Secondary | ICD-10-CM | POA: Diagnosis present

## 2017-03-05 DIAGNOSIS — K589 Irritable bowel syndrome without diarrhea: Secondary | ICD-10-CM | POA: Diagnosis present

## 2017-03-05 DIAGNOSIS — Z7951 Long term (current) use of inhaled steroids: Secondary | ICD-10-CM | POA: Diagnosis not present

## 2017-03-05 DIAGNOSIS — Z7982 Long term (current) use of aspirin: Secondary | ICD-10-CM | POA: Diagnosis not present

## 2017-03-05 DIAGNOSIS — Z96652 Presence of left artificial knee joint: Secondary | ICD-10-CM | POA: Diagnosis present

## 2017-03-05 DIAGNOSIS — J441 Chronic obstructive pulmonary disease with (acute) exacerbation: Secondary | ICD-10-CM | POA: Diagnosis present

## 2017-03-05 DIAGNOSIS — J122 Parainfluenza virus pneumonia: Secondary | ICD-10-CM | POA: Diagnosis present

## 2017-03-05 DIAGNOSIS — I10 Essential (primary) hypertension: Secondary | ICD-10-CM | POA: Diagnosis present

## 2017-03-05 DIAGNOSIS — F329 Major depressive disorder, single episode, unspecified: Secondary | ICD-10-CM | POA: Diagnosis present

## 2017-03-05 DIAGNOSIS — G4700 Insomnia, unspecified: Secondary | ICD-10-CM | POA: Diagnosis present

## 2017-03-05 DIAGNOSIS — Z96642 Presence of left artificial hip joint: Secondary | ICD-10-CM | POA: Diagnosis present

## 2017-03-05 DIAGNOSIS — J9621 Acute and chronic respiratory failure with hypoxia: Secondary | ICD-10-CM | POA: Diagnosis present

## 2017-03-05 DIAGNOSIS — J440 Chronic obstructive pulmonary disease with acute lower respiratory infection: Secondary | ICD-10-CM | POA: Diagnosis present

## 2017-03-05 DIAGNOSIS — Z9089 Acquired absence of other organs: Secondary | ICD-10-CM | POA: Diagnosis not present

## 2017-03-05 DIAGNOSIS — Z8701 Personal history of pneumonia (recurrent): Secondary | ICD-10-CM | POA: Diagnosis not present

## 2017-03-05 DIAGNOSIS — G7000 Myasthenia gravis without (acute) exacerbation: Secondary | ICD-10-CM | POA: Diagnosis present

## 2017-03-05 DIAGNOSIS — Z79899 Other long term (current) drug therapy: Secondary | ICD-10-CM | POA: Diagnosis not present

## 2017-03-05 DIAGNOSIS — Z90710 Acquired absence of both cervix and uterus: Secondary | ICD-10-CM | POA: Diagnosis not present

## 2017-03-05 DIAGNOSIS — Z8249 Family history of ischemic heart disease and other diseases of the circulatory system: Secondary | ICD-10-CM | POA: Diagnosis not present

## 2017-03-05 DIAGNOSIS — Y95 Nosocomial condition: Secondary | ICD-10-CM | POA: Diagnosis present

## 2017-03-05 DIAGNOSIS — R0902 Hypoxemia: Secondary | ICD-10-CM | POA: Diagnosis present

## 2017-03-05 DIAGNOSIS — B348 Other viral infections of unspecified site: Secondary | ICD-10-CM | POA: Diagnosis not present

## 2017-03-05 DIAGNOSIS — J189 Pneumonia, unspecified organism: Secondary | ICD-10-CM | POA: Diagnosis not present

## 2017-03-05 LAB — CBC
HCT: 33.4 % — ABNORMAL LOW (ref 36.0–46.0)
HCT: 36.6 % (ref 36.0–46.0)
Hemoglobin: 10.4 g/dL — ABNORMAL LOW (ref 12.0–15.0)
Hemoglobin: 11.6 g/dL — ABNORMAL LOW (ref 12.0–15.0)
MCH: 29.1 pg (ref 26.0–34.0)
MCH: 28.8 pg (ref 26.0–34.0)
MCHC: 31.1 g/dL (ref 30.0–36.0)
MCHC: 31.7 g/dL (ref 30.0–36.0)
MCV: 93.6 fL (ref 78.0–100.0)
MCV: 90.8 fL (ref 78.0–100.0)
PLATELETS: 170 10*3/uL (ref 150–400)
Platelets: 140 10*3/uL — ABNORMAL LOW (ref 150–400)
RBC: 3.57 MIL/uL — AB (ref 3.87–5.11)
RBC: 4.03 MIL/uL (ref 3.87–5.11)
RDW: 14.5 % (ref 11.5–15.5)
RDW: 14.3 % (ref 11.5–15.5)
WBC: 7 10*3/uL (ref 4.0–10.5)
WBC: 7 10*3/uL (ref 4.0–10.5)

## 2017-03-05 LAB — HIV ANTIBODY (ROUTINE TESTING W REFLEX): HIV Screen 4th Generation wRfx: NONREACTIVE

## 2017-03-05 LAB — RESPIRATORY PANEL BY PCR
Adenovirus: NOT DETECTED
BORDETELLA PERTUSSIS-RVPCR: NOT DETECTED
CORONAVIRUS 229E-RVPPCR: NOT DETECTED
CORONAVIRUS HKU1-RVPPCR: NOT DETECTED
CORONAVIRUS OC43-RVPPCR: NOT DETECTED
Chlamydophila pneumoniae: NOT DETECTED
Coronavirus NL63: NOT DETECTED
Influenza A: NOT DETECTED
Influenza B: NOT DETECTED
METAPNEUMOVIRUS-RVPPCR: NOT DETECTED
MYCOPLASMA PNEUMONIAE-RVPPCR: NOT DETECTED
PARAINFLUENZA VIRUS 1-RVPPCR: NOT DETECTED
PARAINFLUENZA VIRUS 2-RVPPCR: NOT DETECTED
Parainfluenza Virus 3: NOT DETECTED
Parainfluenza Virus 4: DETECTED — AB
Respiratory Syncytial Virus: NOT DETECTED
Rhinovirus / Enterovirus: NOT DETECTED

## 2017-03-05 LAB — BASIC METABOLIC PANEL
Anion gap: 8 (ref 5–15)
BUN: 30 mg/dL — AB (ref 6–20)
CHLORIDE: 106 mmol/L (ref 101–111)
CO2: 23 mmol/L (ref 22–32)
CREATININE: 0.74 mg/dL (ref 0.44–1.00)
Calcium: 8.6 mg/dL — ABNORMAL LOW (ref 8.9–10.3)
Glucose, Bld: 120 mg/dL — ABNORMAL HIGH (ref 65–99)
POTASSIUM: 3.8 mmol/L (ref 3.5–5.1)
SODIUM: 137 mmol/L (ref 135–145)

## 2017-03-05 LAB — CREATININE, SERUM
CREATININE: 0.86 mg/dL (ref 0.44–1.00)
GFR, EST NON AFRICAN AMERICAN: 58 mL/min — AB (ref >60)

## 2017-03-05 LAB — STREP PNEUMONIAE URINARY ANTIGEN: STREP PNEUMO URINARY ANTIGEN: NEGATIVE

## 2017-03-05 LAB — INFLUENZA PANEL BY PCR (TYPE A & B)
INFLBPCR: NEGATIVE
Influenza A By PCR: NEGATIVE

## 2017-03-05 LAB — MRSA PCR SCREENING: MRSA BY PCR: NEGATIVE

## 2017-03-05 MED ORDER — TRAMADOL HCL 50 MG PO TABS
50.0000 mg | ORAL_TABLET | Freq: Two times a day (BID) | ORAL | Status: DC | PRN
  Administered 2017-03-05 – 2017-03-12 (×3): 50 mg via ORAL
  Filled 2017-03-05 (×3): qty 1

## 2017-03-05 MED ORDER — METHYLPREDNISOLONE SODIUM SUCC 40 MG IJ SOLR
40.0000 mg | Freq: Every day | INTRAMUSCULAR | Status: DC
Start: 2017-03-05 — End: 2017-03-08
  Administered 2017-03-05 – 2017-03-08 (×4): 40 mg via INTRAVENOUS
  Filled 2017-03-05 (×4): qty 1

## 2017-03-05 MED ORDER — PROMETHAZINE HCL 25 MG PO TABS: 25.0000 mg | ORAL_TABLET | Freq: Four times a day (QID) | ORAL | Status: DC | PRN

## 2017-03-05 MED ORDER — BUDESONIDE 0.25 MG/2ML IN SUSP
0.2500 mg | Freq: Two times a day (BID) | RESPIRATORY_TRACT | Status: DC
  Administered 2017-03-05 (×2): 0.25 mg via RESPIRATORY_TRACT
  Filled 2017-03-05 (×3): qty 2

## 2017-03-05 MED ORDER — CEFEPIME HCL 1 G IJ SOLR
1.0000 g | Freq: Three times a day (TID) | INTRAMUSCULAR | Status: DC
  Administered 2017-03-05 (×2): 1 g via INTRAVENOUS
  Filled 2017-03-05 (×5): qty 1

## 2017-03-05 MED ORDER — LEVALBUTEROL HCL 0.63 MG/3ML IN NEBU
0.6300 mg | INHALATION_SOLUTION | Freq: Four times a day (QID) | RESPIRATORY_TRACT | Status: DC | PRN
  Filled 2017-03-05: qty 3

## 2017-03-05 MED ORDER — ACETAMINOPHEN 650 MG RE SUPP: 650.0000 mg | Freq: Four times a day (QID) | RECTAL | Status: DC | PRN

## 2017-03-05 MED ORDER — ORAL CARE MOUTH RINSE
15.0000 mL | Freq: Two times a day (BID) | OROMUCOSAL | Status: DC
  Administered 2017-03-05 – 2017-03-13 (×10): 15 mL via OROMUCOSAL

## 2017-03-05 MED ORDER — ONDANSETRON HCL 4 MG/2ML IJ SOLN: 4.0000 mg | Freq: Four times a day (QID) | INTRAMUSCULAR | Status: DC | PRN

## 2017-03-05 MED ORDER — METHIMAZOLE 5 MG PO TABS
5.0000 mg | ORAL_TABLET | Freq: Every day | ORAL | Status: DC
  Administered 2017-03-05 – 2017-03-13 (×9): 5 mg via ORAL
  Filled 2017-03-05 (×9): qty 1

## 2017-03-05 MED ORDER — LINEZOLID 600 MG/300ML IV SOLN
600.0000 mg | Freq: Once | INTRAVENOUS | Status: AC
  Administered 2017-03-05: 600 mg via INTRAVENOUS
  Filled 2017-03-05: qty 300

## 2017-03-05 MED ORDER — PANTOPRAZOLE SODIUM 40 MG PO TBEC
40.0000 mg | DELAYED_RELEASE_TABLET | Freq: Every day | ORAL | Status: DC
  Administered 2017-03-05 – 2017-03-13 (×9): 40 mg via ORAL
  Filled 2017-03-05 (×9): qty 1

## 2017-03-05 MED ORDER — QUETIAPINE FUMARATE 25 MG PO TABS
12.5000 mg | ORAL_TABLET | Freq: Every day | ORAL | Status: DC
  Administered 2017-03-05 – 2017-03-13 (×9): 12.5 mg via ORAL
  Filled 2017-03-05 (×9): qty 1

## 2017-03-05 MED ORDER — TRAZODONE HCL 50 MG PO TABS
75.0000 mg | ORAL_TABLET | Freq: Every day | ORAL | Status: DC
  Administered 2017-03-05 – 2017-03-10 (×6): 75 mg via ORAL
  Filled 2017-03-05 (×6): qty 2

## 2017-03-05 MED ORDER — LISINOPRIL 20 MG PO TABS
40.0000 mg | ORAL_TABLET | Freq: Every day | ORAL | Status: DC
  Administered 2017-03-05 – 2017-03-13 (×9): 40 mg via ORAL
  Filled 2017-03-05 (×9): qty 2

## 2017-03-05 MED ORDER — METAXALONE 800 MG PO TABS
800.0000 mg | ORAL_TABLET | Freq: Two times a day (BID) | ORAL | Status: DC | PRN
  Filled 2017-03-05: qty 1

## 2017-03-05 MED ORDER — MYCOPHENOLATE MOFETIL 250 MG PO CAPS
500.0000 mg | ORAL_CAPSULE | Freq: Two times a day (BID) | ORAL | Status: DC
  Administered 2017-03-05 – 2017-03-13 (×18): 500 mg via ORAL
  Filled 2017-03-05 (×19): qty 2

## 2017-03-05 MED ORDER — POLYETHYLENE GLYCOL 3350 17 G PO PACK
17.0000 g | PACK | Freq: Two times a day (BID) | ORAL | Status: DC
  Administered 2017-03-05 – 2017-03-13 (×17): 17 g via ORAL
  Filled 2017-03-05 (×18): qty 1

## 2017-03-05 MED ORDER — ONDANSETRON HCL 4 MG PO TABS: 4.0000 mg | ORAL_TABLET | Freq: Four times a day (QID) | ORAL | Status: DC | PRN

## 2017-03-05 MED ORDER — ACETAMINOPHEN 325 MG PO TABS
650.0000 mg | ORAL_TABLET | Freq: Four times a day (QID) | ORAL | Status: DC | PRN
  Filled 2017-03-05: qty 2

## 2017-03-05 MED ORDER — AMITRIPTYLINE HCL 25 MG PO TABS
75.0000 mg | ORAL_TABLET | Freq: Every day | ORAL | Status: DC
  Administered 2017-03-05 – 2017-03-13 (×9): 75 mg via ORAL
  Filled 2017-03-05 (×9): qty 3

## 2017-03-05 MED ORDER — DILTIAZEM HCL ER COATED BEADS 120 MG PO CP24
300.0000 mg | ORAL_CAPSULE | Freq: Every day | ORAL | Status: DC
  Administered 2017-03-05 – 2017-03-13 (×9): 300 mg via ORAL
  Filled 2017-03-05 (×9): qty 1

## 2017-03-05 MED ORDER — LEVALBUTEROL HCL 0.63 MG/3ML IN NEBU
0.6300 mg | INHALATION_SOLUTION | Freq: Four times a day (QID) | RESPIRATORY_TRACT | Status: DC
  Administered 2017-03-05: 0.63 mg via RESPIRATORY_TRACT
  Filled 2017-03-05: qty 3

## 2017-03-05 MED ORDER — ENOXAPARIN SODIUM 40 MG/0.4ML ~~LOC~~ SOLN: 40.0000 mg | SUBCUTANEOUS | Status: DC

## 2017-03-05 MED ORDER — VITAMIN D 1000 UNITS PO TABS
2000.0000 [IU] | ORAL_TABLET | Freq: Every day | ORAL | Status: DC
  Administered 2017-03-05 – 2017-03-13 (×9): 2000 [IU] via ORAL
  Filled 2017-03-05 (×9): qty 2

## 2017-03-05 MED ORDER — LEVALBUTEROL HCL 0.63 MG/3ML IN NEBU
0.6300 mg | INHALATION_SOLUTION | Freq: Three times a day (TID) | RESPIRATORY_TRACT | Status: DC
  Administered 2017-03-05 (×3): 0.63 mg via RESPIRATORY_TRACT
  Filled 2017-03-05 (×4): qty 3

## 2017-03-05 MED ORDER — ASPIRIN EC 81 MG PO TBEC
81.0000 mg | DELAYED_RELEASE_TABLET | Freq: Every day | ORAL | Status: DC
  Administered 2017-03-05 – 2017-03-13 (×9): 81 mg via ORAL
  Filled 2017-03-05 (×9): qty 1

## 2017-03-05 MED ORDER — PAROXETINE HCL 20 MG PO TABS
40.0000 mg | ORAL_TABLET | Freq: Every day | ORAL | Status: DC
  Administered 2017-03-05 – 2017-03-13 (×9): 40 mg via ORAL
  Filled 2017-03-05 (×9): qty 2

## 2017-03-05 MED ORDER — CRANBERRY 425 MG PO CAPS: 425.0000 mg | ORAL_CAPSULE | Freq: Two times a day (BID) | ORAL | Status: DC

## 2017-03-05 MED ORDER — SENNOSIDES-DOCUSATE SODIUM 8.6-50 MG PO TABS
1.0000 | ORAL_TABLET | Freq: Two times a day (BID) | ORAL | Status: DC
  Administered 2017-03-05 – 2017-03-13 (×8): 1 via ORAL
  Filled 2017-03-05 (×15): qty 1

## 2017-03-05 MED ORDER — HYDROCODONE-ACETAMINOPHEN 5-325 MG PO TABS
1.0000 | ORAL_TABLET | Freq: Three times a day (TID) | ORAL | Status: DC
  Administered 2017-03-05 – 2017-03-13 (×27): 1 via ORAL
  Filled 2017-03-05 (×28): qty 1

## 2017-03-05 MED ORDER — AMIODARONE HCL 200 MG PO TABS
200.0000 mg | ORAL_TABLET | Freq: Every day | ORAL | Status: DC
  Administered 2017-03-05 – 2017-03-13 (×9): 200 mg via ORAL
  Filled 2017-03-05 (×9): qty 1

## 2017-03-05 MED ORDER — HYDROCHLOROTHIAZIDE 25 MG PO TABS
25.0000 mg | ORAL_TABLET | ORAL | Status: DC
  Administered 2017-03-06 – 2017-03-13 (×4): 25 mg via ORAL
  Filled 2017-03-05 (×4): qty 1

## 2017-03-05 MED ORDER — BISACODYL 10 MG RE SUPP: 10.0000 mg | Freq: Every day | RECTAL | Status: DC | PRN

## 2017-03-05 MED ORDER — ENOXAPARIN SODIUM 40 MG/0.4ML ~~LOC~~ SOLN
40.0000 mg | SUBCUTANEOUS | Status: DC
  Administered 2017-03-05 – 2017-03-13 (×9): 40 mg via SUBCUTANEOUS
  Filled 2017-03-05 (×9): qty 0.4

## 2017-03-05 NOTE — Progress Notes (Signed)
PT Cancellation Note  Patient Details Name: Paula Bird MRN: [id number] DOB: [birth year]   Cancelled Treatment:    Reason Eval/Treat Not Completed: Other (comment), patient will be moving to 4 E. Will check back another time.   Sharen HeckHill, Duquan Gillooly Elizabeth Uchenna Seufert PT [phone]  03/05/2017, 2:33 PM

## 2017-03-05 NOTE — Progress Notes (Signed)
NIF -25/VC 1.1L Pt had good effort.  

## 2017-03-05 NOTE — Progress Notes (Signed)
NIF-30 VC-1.2L Pt gave good effort

## 2017-03-05 NOTE — ED Notes (Signed)
Assigned [id number] @ 14:07 call report @ 14:27

## 2017-03-05 NOTE — H&P (Signed)
History and Physical    Paula Bird ZOX:[id number] DOB: 10-16-1925 DOA: 03/04/2017  PCP: Angela Cox, MD  Patient coming from: Nursing home.  Chief Complaint: Shortness of breath.  HPI: Paula Bird is a 83 y.o. female with history of myasthenia gravis, paroxysmal atrial fibrillation, COPD presents to the ER with complaints of shortness of breath.  Patient has been having shortness of breath for last 1 week with productive sputum.  Patient states her sputum is very thick and difficult to cough out.  Patient was on antibiotics despite which patient was still feeling short of breath and was brought to the ER.  Denies any chest pain fever or chills.  ED Course: In the ER patient chest x-ray shows possible pneumonia and on exam patient has wheezing bilaterally.  Patient was started on antibiotics Zyvox and cefepime for pneumonia and since patient has myasthenia gravis NIF and vital capacity were checked which were within acceptable limits.  Patient was also placed on nebulizer and Pulmicort for wheezing.  Review of Systems: As per HPI, rest all negative.   Past Medical History:  Diagnosis Date  . Acute and chronic respiratory failure, unspecified whether with hypoxia or hypercapnia (HCC)   . Afib (HCC)   . Allergic rhinitis   . Anemia   . CKD (chronic kidney disease)   . Constipation   . COPD (chronic obstructive pulmonary disease) (HCC)   . Depression   . Diplopia   . Dysphagia   . Edema, lower extremity   . Esophageal dysmotility   . Gait instability   . GERD (gastroesophageal reflux disease)   . Hypertension   . Hypothyroidism   . IBS (irritable bowel syndrome)   . IDA (iron deficiency anemia)   . Insomnia   . Leukocytosis   . Myasthenia gravis (HCC)   . Oral thrush   . PAF (paroxysmal atrial fibrillation) (HCC)   . Physical deconditioning   . Protein calorie malnutrition (HCC)   . Thyroid disease   . Thyroid mass   . Vertigo     Past Surgical  History:  Procedure Laterality Date  . ABDOMINAL HYSTERECTOMY    . APPENDECTOMY    . TOTAL HIP ARTHROPLASTY Left   . TOTAL KNEE ARTHROPLASTY Left      reports that  has never smoked. she has never used smokeless tobacco. She reports that she does not drink alcohol or use drugs.  No Known Allergies  Family History  Problem Relation Age of Onset  . Hypertension Mother   . Hypertension Son   . Diabetes Neg Hx   . Thyroid disease Neg Hx     Prior to Admission medications   Medication Sig Start Date End Date Taking? Authorizing Provider  acetaminophen (TYLENOL) 325 MG tablet Take 650 mg by mouth every 4 (four) hours as needed for mild pain, moderate pain, fever or headache.     [provider]  amiodarone (PACERONE) 200 MG tablet Take 200 mg by mouth daily.     [provider]  amitriptyline (ELAVIL) 75 MG tablet Take 75 mg by mouth at bedtime. 07/12/15   [provider]  aspirin EC 81 MG tablet Take 81 mg by mouth daily.    [provider]  bisacodyl (DULCOLAX) 10 MG suppository Place 10 mg rectally daily as needed for moderate constipation.    [provider]  Cholecalciferol (VITAMIN D3) 2000 units TABS Take 1 tablet by mouth daily.    [provider]  Cranberry 425  MG CAPS Take 425 mg by mouth 2 (two) times daily.    [provider]  diltiazem (CARDIZEM CD) 300 MG 24 hr capsule Take 300 mg by mouth daily. 03/04/16   [provider]  esomeprazole (NEXIUM) 20 MG capsule Take 20 mg by mouth 2 (two) times daily.     [provider]  hydrochlorothiazide (HYDRODIURIL) 25 MG tablet Take 1 tablet (25 mg total) by mouth every Monday, Wednesday, and Friday. 08/16/15   Albertine GratesXu, Fang, MD  HYDROcodone-acetaminophen (NORCO/VICODIN) 5-325 MG tablet Take 1 tablet by mouth 3 (three) times daily. For pain 10/31/16   [provider]  lisinopril (PRINIVIL,ZESTRIL) 40 MG tablet Take 40 mg by mouth daily. 03/04/16   [provider]  meloxicam (MOBIC) 7.5 MG tablet Take 7.5 mg by mouth daily. 10/25/16   [provider]  metaxalone (SKELAXIN) 800 MG tablet Take 800 mg by mouth 2 (two) times daily as needed for muscle spasms.    [provider]  methimazole (TAPAZOLE) 5 MG tablet Take 1 tablet (5 mg total) by mouth daily. 07/24/16   Romero BellingEllison, Sean, MD  Multiple Vitamins-Minerals (MULTIVITAMIN GUMMIES ADULT PO) Take 1 tablet by mouth daily.    [provider]  mycophenolate (CELLCEPT) 500 MG tablet Take 1 tablet (500 mg total) by mouth 2 (two) times daily. 10/16/16   Levert FeinsteinYan, Yijun, MD  Olopatadine HCl (PATADAY) 0.2 % SOLN Apply 1 drop to eye daily.    [provider]  PARoxetine (PAXIL) 40 MG tablet Take 40 mg by mouth daily. 07/23/15   [provider]  polyethylene glycol (MIRALAX / GLYCOLAX) packet Take 17 g by mouth daily. Patient taking differently: Take 17 g by mouth 2 (two) times daily.  08/27/14   Arthor CaptainHarris, Abigail, PA-C  polyvinyl alcohol (LIQUIFILM TEARS) 1.4 % ophthalmic solution Place 1 drop into both eyes 4 (four) times daily as needed for dry eyes.    [provider]  promethazine (PHENERGAN) 25 MG tablet Take 25 mg by mouth every 6 (six) hours as needed for nausea or vomiting.    [provider]  QUEtiapine (SEROQUEL) 25 MG tablet Take 12.5 mg by mouth at bedtime. Taking 0.5 - 1 tablet at bedtime.     [provider]  sennosides-docusate sodium (SENOKOT-S) 8.6-50 MG tablet Take 1 tablet by mouth 2 (two) times daily. 08/16/15   Albertine GratesXu, Fang, MD  SUMAtriptan (IMITREX) 25 MG tablet Take 50 mg by mouth daily as needed for migraine. May repeat in 2 hours if headache persists or recurs.    [provider]  traMADol (ULTRAM) 50 MG tablet Take by mouth every 12 (twelve) hours as needed.    [provider]  traZODone (DESYREL) 150 MG tablet Take 75 mg by mouth at bedtime.     [provider]    Physical Exam: Vitals:   03/04/17  2200 03/05/17 0000 03/05/17 0030  BP:  (!) 108/47 (!) 116/53  Pulse:  60 60  Resp:  12 15  SpO2: 95% 96%       Constitutional: Moderately built and nourished. Vitals:   03/04/17 2200 03/05/17 0000 03/05/17 0030  BP:  (!) 108/47 (!) 116/53  Pulse:  60 60  Resp:  12 15  SpO2: 95% 96%    Eyes: Anicteric no pallor. ENMT: No discharge from the ears eyes nose and mouth. Neck: No JVD appreciated no mass felt. Respiratory: Bilateral expiratory wheeze heard no crepitations. Cardiovascular: S1-S2 heard. Abdomen: Soft nontender bowel sounds present.  Musculoskeletal: No edema. Skin: No rash. Neurologic: Alert awake oriented to time place and person.  Moves all extremities. Psychiatric: Appears normal.  Normal affect.   Labs on Admission: I have personally reviewed following labs and imaging studies  CBC: Recent Labs  Lab 03/04/17 2247  WBC 8.4  NEUTROABS 6.2  HGB 12.7  HCT 39.6  MCV 90.8  PLT 157   Basic Metabolic Panel: Recent Labs  Lab 03/04/17 2247  NA 135  K 4.1  CL 103  CO2 25  GLUCOSE 107*  BUN 36*  CREATININE 0.89  CALCIUM 9.0   GFR: CrCl cannot be calculated (Unknown ideal weight.). Liver Function Tests: No results for input(s): AST, ALT, ALKPHOS, BILITOT, PROT, ALBUMIN in the last 168 hours. No results for input(s): LIPASE, AMYLASE in the last 168 hours. No results for input(s): AMMONIA in the last 168 hours. Coagulation Profile: No results for input(s): INR, PROTIME in the last 168 hours. Cardiac Enzymes: No results for input(s): CKTOTAL, CKMB, CKMBINDEX, TROPONINI in the last 168 hours. BNP (last 3 results) No results for input(s): PROBNP in the last 8760 hours. HbA1C: No results for input(s): HGBA1C in the last 72 hours. CBG: No results for input(s): GLUCAP in the last 168 hours. Lipid Profile: No results for input(s): CHOL, HDL, LDLCALC, TRIG, CHOLHDL, LDLDIRECT in the last 72 hours. Thyroid Function Tests: No results for input(s): TSH,  T4TOTAL, FREET4, T3FREE, THYROIDAB in the last 72 hours. Anemia Panel: No results for input(s): VITAMINB12, FOLATE, FERRITIN, TIBC, IRON, RETICCTPCT in the last 72 hours. Urine analysis:    Component Value Date/Time   COLORURINE YELLOW 10/08/2015 2102   APPEARANCEUR CLOUDY (A) 10/08/2015 2102   LABSPEC 1.013 10/08/2015 2102   PHURINE 7.5 10/08/2015 2102   GLUCOSEU NEGATIVE 10/08/2015 2102   HGBUR NEGATIVE 10/08/2015 2102   BILIRUBINUR NEGATIVE 10/08/2015 2102   KETONESUR NEGATIVE 10/08/2015 2102   PROTEINUR NEGATIVE 10/08/2015 2102   UROBILINOGEN 0.2 08/13/2014 2256   NITRITE NEGATIVE 10/08/2015 2102   LEUKOCYTESUR SMALL (A) 10/08/2015 2102   Sepsis Labs: @LABRCNTIP (procalcitonin:4,lacticidven:4) )No results found for this or any previous visit (from the past 240 hour(s)).   Radiological Exams on Admission: Dg Chest 2 View  Result Date: 03/04/2017 CLINICAL DATA:  Worsening cough and dyspnea. EXAM: CHEST  2 VIEW COMPARISON:  Chest CT 02/28/2016 and CXR 11/03/2016, lumbar spine radiographs from 11/03/2016. FINDINGS: Stable cardiomegaly with aortic atherosclerosis. Right paratracheal soft tissue prominence is stable, consistent with a tortuous ectatic right subclavian artery on prior chest CT. Minimal airspace opacity at the left lung base overlying the costophrenic angle may represent a tiny focus of pneumonia or atelectasis. Old left posterior seventh rib fracture. Vertebral augmentation T12 and L2 with chronic stable moderate compression of L1. IMPRESSION: 1. Cardiomegaly with aortic atherosclerosis. 2. Small focus of airspace opacity in the left lung base suspicious for atelectasis and/or pneumonia. 3. Vertebral augmentation T12 and L2 with chronic moderate compression of L1. Electronically Signed   By: Tollie Eth M.D.   On: 03/04/2017 22:57    EKG: Independently reviewed.  EKG is to return as a junctional rhythm but I can see some P waves.  Assessment/Plan Principal Problem:    HCAP (healthcare-associated pneumonia) Active Problems:   Hypertension   Obstructive chronic bronchitis with acute exacerbation (HCC)   Paroxysmal atrial fibrillation (HCC)   Myasthenia gravis (HCC)    1. Acute respiratory failure with hypoxia likely from healthcare associated pneumonia and COPD exacerbation -patient has been placed on Zyvox and cefepime for  healthcare associated pneumonia.  Follow cultures.  Check respiratory viral panel.  For COPD exacerbation for now I have kept patient on bronchodilators and Pulmicort and if still continues to wheeze will start IV steroids.  Patient is also on amiodarone so if respiratory status does not improve consider CT chest. 2. Myasthenia gravis -continue home dose CellCept.  Will check NIF and vital capacity q. 12 hourly. 3. Hyperthyroidism on Tapazole. 4. Hypertension on lisinopril hydrochlorothiazide Cardizem. 5. History of paroxysmal atrial fibrillation not on anticoagulation secondary to fall.  Patient is on amiodarone.   DVT prophylaxis: Lovenox. Code Status: Full code. Family Communication: Discussed with patient. Disposition Plan: Back to skilled nursing facility. Consults called: None. Admission status: Inpatient.   Eduard ClosArshad N Mordche Hedglin MD [facility] Pager [phone].  If 7PM-7AM, please contact night-coverage [email or web address] Password [email or web address]  03/05/2017, 1:09 AM

## 2017-03-05 NOTE — Progress Notes (Signed)
Patient resting at this time. Will obtain NIF/VC when patient is more awake.

## 2017-03-05 NOTE — Progress Notes (Signed)
Patient ID: Paula Bird, female   DOB: 29-Jul-1925, 83 y.o.   MRN: [id number] Patient admitted early this morning for hypoxia secondary to pneumonia and COPD exacerbation.  Patient was started on intravenous antibiotics.  She was seen and examined at bedside and plan of care discussed with the patient.  She is mildly wheezing.  Will add Solu-Medrol 40 mill grams IV daily.  Continue cefepime.  Order MRSA PCR, if it is negative will hold off on antibiotics targeted towards MRSA.  Repeat a.m. labs.  Follow cultures.

## 2017-03-05 NOTE — Progress Notes (Signed)
PHARMACIST - PHYSICIAN ORDER COMMUNICATION  CONCERNING: P&T Medication Policy on Herbal Medications  DESCRIPTION:  This patient's order for:  Cranberry  has been noted.  This product(s) is classified as an "herbal" or natural product. Due to a lack of definitive safety studies or FDA approval, nonstandard manufacturing practices, plus the potential risk of unknown drug-drug interactions while on inpatient medications, the Pharmacy and Therapeutics Committee does not permit the use of "herbal" or natural products of this type within [facility].   ACTION TAKEN: The pharmacy department is unable to verify this order at this time and your patient has been informed of this safety policy. Please reevaluate patient's clinical condition at discharge and address if the herbal or natural product(s) should be resumed at that time.  Otho BellowsGreen, Qasim Diveley L PharmD Pager [phone] 03/05/2017, 4:08 AM

## 2017-03-05 NOTE — Progress Notes (Signed)
Pt noted for excessive couging prior to and during attempts at NIF/Vital capacity maneuvers.  Best of three attempts:  NIF:-28, VC=0.6L, MD notified.

## 2017-03-06 DIAGNOSIS — I480 Paroxysmal atrial fibrillation: Secondary | ICD-10-CM

## 2017-03-06 DIAGNOSIS — B348 Other viral infections of unspecified site: Secondary | ICD-10-CM | POA: Diagnosis present

## 2017-03-06 LAB — CBC WITH DIFFERENTIAL/PLATELET
BASOS ABS: 0 10*3/uL (ref 0.0–0.1)
Basophils Relative: 0 %
Eosinophils Absolute: 0 10*3/uL (ref 0.0–0.7)
Eosinophils Relative: 0 %
HEMATOCRIT: 35.5 % — AB (ref 36.0–46.0)
HEMOGLOBIN: 11.4 g/dL — AB (ref 12.0–15.0)
LYMPHS PCT: 7 %
Lymphs Abs: 0.5 10*3/uL — ABNORMAL LOW (ref 0.7–4.0)
MCH: 29.1 pg (ref 26.0–34.0)
MCHC: 32.1 g/dL (ref 30.0–36.0)
MCV: 90.6 fL (ref 78.0–100.0)
MONO ABS: 0.2 10*3/uL (ref 0.1–1.0)
Monocytes Relative: 3 %
NEUTROS ABS: 6.2 10*3/uL (ref 1.7–7.7)
NEUTROS PCT: 90 %
Platelets: 202 10*3/uL (ref 150–400)
RBC: 3.92 MIL/uL (ref 3.87–5.11)
RDW: 14.4 % (ref 11.5–15.5)
WBC: 6.8 10*3/uL (ref 4.0–10.5)

## 2017-03-06 LAB — LEGIONELLA PNEUMOPHILA SEROGP 1 UR AG: L. pneumophila Serogp 1 Ur Ag: NEGATIVE

## 2017-03-06 LAB — COMPREHENSIVE METABOLIC PANEL
ALK PHOS: 40 U/L (ref 38–126)
ALT: 43 U/L (ref 14–54)
AST: 27 U/L (ref 15–41)
Albumin: 3.6 g/dL (ref 3.5–5.0)
Anion gap: 8 (ref 5–15)
BILIRUBIN TOTAL: 0.5 mg/dL (ref 0.3–1.2)
BUN: 26 mg/dL — AB (ref 6–20)
CALCIUM: 8.5 mg/dL — AB (ref 8.9–10.3)
CO2: 23 mmol/L (ref 22–32)
CREATININE: 0.69 mg/dL (ref 0.44–1.00)
Chloride: 103 mmol/L (ref 101–111)
GFR calc Af Amer: >60 mL/min (ref >60)
GLUCOSE: 147 mg/dL — AB (ref 65–99)
POTASSIUM: 3.9 mmol/L (ref 3.5–5.1)
Sodium: 134 mmol/L — ABNORMAL LOW (ref 135–145)
TOTAL PROTEIN: 5.9 g/dL — AB (ref 6.5–8.1)

## 2017-03-06 LAB — MAGNESIUM: MAGNESIUM: 2.2 mg/dL (ref 1.7–2.4)

## 2017-03-06 MED ORDER — DEXTROSE 5 % IV SOLN
2.0000 g | INTRAVENOUS | Status: DC
  Administered 2017-03-06 – 2017-03-07 (×2): 2 g via INTRAVENOUS
  Filled 2017-03-06 (×2): qty 2

## 2017-03-06 MED ORDER — DIPHENHYDRAMINE HCL 50 MG PO CAPS
50.0000 mg | ORAL_CAPSULE | Freq: Once | ORAL | Status: AC
  Administered 2017-03-06: 50 mg via ORAL
  Filled 2017-03-06: qty 1

## 2017-03-06 MED ORDER — IPRATROPIUM BROMIDE 0.02 % IN SOLN: 0.5000 mg | RESPIRATORY_TRACT | Status: DC | PRN

## 2017-03-06 MED ORDER — LEVALBUTEROL HCL 0.63 MG/3ML IN NEBU
0.6300 mg | INHALATION_SOLUTION | Freq: Four times a day (QID) | RESPIRATORY_TRACT | Status: DC
  Administered 2017-03-06 – 2017-03-08 (×8): 0.63 mg via RESPIRATORY_TRACT
  Filled 2017-03-06 (×8): qty 3

## 2017-03-06 MED ORDER — ARFORMOTEROL TARTRATE 15 MCG/2ML IN NEBU
15.0000 ug | INHALATION_SOLUTION | Freq: Two times a day (BID) | RESPIRATORY_TRACT | Status: DC
  Administered 2017-03-06 – 2017-03-13 (×15): 15 ug via RESPIRATORY_TRACT
  Filled 2017-03-06 (×14): qty 2

## 2017-03-06 MED ORDER — LORATADINE 10 MG PO TABS
10.0000 mg | ORAL_TABLET | Freq: Every day | ORAL | Status: DC
  Administered 2017-03-06 – 2017-03-13 (×8): 10 mg via ORAL
  Filled 2017-03-06 (×8): qty 1

## 2017-03-06 MED ORDER — GUAIFENESIN ER 600 MG PO TB12
1200.0000 mg | ORAL_TABLET | Freq: Two times a day (BID) | ORAL | Status: DC
  Administered 2017-03-06 – 2017-03-13 (×16): 1200 mg via ORAL
  Filled 2017-03-06 (×16): qty 2

## 2017-03-06 MED ORDER — FLUTICASONE PROPIONATE 50 MCG/ACT NA SUSP
2.0000 | Freq: Every day | NASAL | Status: DC
  Administered 2017-03-06 – 2017-03-13 (×7): 2 via NASAL
  Filled 2017-03-06: qty 16

## 2017-03-06 MED ORDER — IPRATROPIUM BROMIDE 0.02 % IN SOLN
0.5000 mg | Freq: Four times a day (QID) | RESPIRATORY_TRACT | Status: DC
  Administered 2017-03-06 – 2017-03-08 (×8): 0.5 mg via RESPIRATORY_TRACT
  Filled 2017-03-06 (×8): qty 2.5

## 2017-03-06 MED ORDER — BUDESONIDE 0.5 MG/2ML IN SUSP
0.5000 mg | Freq: Two times a day (BID) | RESPIRATORY_TRACT | Status: DC
  Administered 2017-03-06 – 2017-03-13 (×15): 0.5 mg via RESPIRATORY_TRACT
  Filled 2017-03-06 (×14): qty 2

## 2017-03-06 NOTE — Progress Notes (Signed)
PHARMACY NOTE:  ANTIMICROBIAL RENAL DOSAGE ADJUSTMENT  Current antimicrobial regimen includes a mismatch between antimicrobial dosage and estimated renal function.  As per policy approved by the Pharmacy & Therapeutics and Medical Executive Committees, the antimicrobial dosage will be adjusted accordingly.  Current antimicrobial dosage:  Cefepime 1gm q8hr  Indication: Pneumonia  Renal Function:   Estimated Creatinine Clearance: 40.3 mL/min (by C-G formula based on SCr of 0.74 mg/dL). []      On intermittent HD, scheduled: []      On CRRT    Antimicrobial dosage has been changed to:  Cefepime 2gm q24   Additional Comments: MRSA PCR negative, no additional abx per MD note   Thank you for allowing pharmacy to be a part of this patient's care.  Otho BellowsGreen, Lovelyn Sheeran L  03/06/2017 12:37 AM

## 2017-03-06 NOTE — Progress Notes (Addendum)
PROGRESS NOTE    Paula Bird  ZOX:[id number] DOB: 08-20-1925 DOA: 03/04/2017 PCP: Angela Cox, MD   Brief Narrative:  History of myasthenia gravis, paroxysmal atrial fibrillation, COPD presented to the ED with complaints of shortness of breath times 1 week with a productive cough.  Patient noted to be on antibiotics however still with shortness of breath and presented to the ED.  Chest x-ray showed possible pneumonia.  Patient noted to be wheezing bilaterally on examination.  Patient started on antibiotics of Zyvox and cefepime for pneumonia.  Patient also on treatments.   Assessment & Plan:   Principal Problem:   HCAP (healthcare-associated pneumonia) Active Problems:   Obstructive chronic bronchitis with acute exacerbation (HCC)   Infection due to parainfluenza virus 4   Hypertension   Depression   Paroxysmal atrial fibrillation (HCC)   Essential hypertension   Myasthenia gravis (HCC)  #1 healthcare associated pneumonia secondary to parainfluenza virus #4 Patient presented with worsening shortness of breath, history of myasthenia gravis chest x-ray worrisome for pneumonia and patient noted to have a productive cough ongoing times 1 week.  Patient with some clinical improvement however not at baseline.  Influenza PCR negative.  Respiratory viral panel consistent and positive for parainfluenza type IV.  Patient with rhonchorous breath sounds.  Placed on Mucinex 1200 mg twice daily.  Increase Pulmicort to 0.5mg  twice daily.  Add Brovana.  Add Claritin, Flonase, chest PT.  MRSA PCR was negative and as such IV vancomycin has been discontinued.  Continue empiric IV cefepime.  Follow.  2.  Myasthenia gravis Continue CellCept. NIF and vital capacity being followed per RT.  3.  Hyperthyroidism Continue Tapazole.  4.  Hypertension Blood pressure stable.  Continue Cardizem, lisinopril.  5.  Mild COPD exacerbation Likely secondary to problem #1.  Continue scheduled Xopenex  nebs.  Add Atrovent scheduled nebs.  Increase Pulmicort to 0.5 mg twice daily.  Add Brovana, Claritin, Flonase, chest PT.  Continue IV Solu-Medrol taper.  Follow.  6.  Atrial fibrillation Continue amiodarone and Cardizem for rate control.  Anticoagulation candidate secondary to falls.  Aspirin.     DVT prophylaxis: Lovenox Code Status: DNR Family Communication: Updated patient.  No family at bedside. Disposition Plan: Likely home with home health services once clinically improved.   Consultants:   None  Procedures:   Chest x-ray 03/04/2017    Antimicrobials:   IV cefepime 03/04/2017  IV vancomycin 03/04/2017>>>> 03/05/2017  IV Zyvox x1 dose 03/05/2017   Subjective: Laying in bed.  Patient states slowly improving with shortness of breath however not at baseline currently.  Denies any chest pain.  Patient noted to be significantly rhonchorous.  Objective: Vitals:   03/05/17 2229 03/06/17 0544 03/06/17 0850 03/06/17 0856  BP: (!) 130/50 135/62    Pulse: 71 62    Resp: 18 16    Temp: 97.8 F (36.6 C) 97.8 F (36.6 C)    TempSrc: Oral Oral    SpO2: 95% 94% 94% 94%  Weight:  66 kg (145 lb 8.1 oz)    Height:        Intake/Output Summary (Last 24 hours) at 03/06/2017 1212 Last data filed at 03/06/2017 0600 Gross per 24 hour  Intake 410 ml  Output 0 ml  Net 410 ml   Filed Weights   03/05/17 1453 03/06/17 0544  Weight: 65.8 kg (145 lb 1 oz) 66 kg (145 lb 8.1 oz)    Examination:  General exam: Appears calm and comfortable  Respiratory system: Coarse  rhonchorous breath sounds left greater than right.  Minimal wheezing.  No crackles.  Speaking in full sentences.  Respiratory effort normal. Cardiovascular system: S1 & S2 heard, RRR. No JVD, murmurs, rubs, gallops or clicks. No pedal edema. Gastrointestinal system: Abdomen is nondistended, soft and nontender. No organomegaly or masses felt. Normal bowel sounds heard. Central nervous system: Alert and oriented. No  focal neurological deficits. Extremities: Symmetric 5 x 5 power. Skin: No rashes, lesions or ulcers Psychiatry: Judgement and insight appear normal. Mood & affect appropriate.     Data Reviewed: I have personally reviewed following labs and imaging studies  CBC: Recent Labs  Lab 03/04/17 2247 03/05/17 0121 03/05/17 0459 03/06/17 0415  WBC 8.4 7.0 7.0 6.8  NEUTROABS 6.2  --   --  6.2  HGB 12.7 10.4* 11.6* 11.4*  HCT 39.6 33.4* 36.6 35.5*  MCV 90.8 93.6 90.8 90.6  PLT 157 140* 170 202   Basic Metabolic Panel: Recent Labs  Lab 03/04/17 2247 03/05/17 0121 03/05/17 0459 03/06/17 0415  NA 135  --  137 134*  K 4.1  --  3.8 3.9  CL 103  --  106 103  CO2 25  --  23 23  GLUCOSE 107*  --  120* 147*  BUN 36*  --  30* 26*  CREATININE 0.89 0.86 0.74 0.69  CALCIUM 9.0  --  8.6* 8.5*  MG  --   --   --  2.2   GFR: Estimated Creatinine Clearance: 40.4 mL/min (by C-G formula based on SCr of 0.69 mg/dL). Liver Function Tests: Recent Labs  Lab 03/06/17 0415  AST 27  ALT 43  ALKPHOS 40  BILITOT 0.5  PROT 5.9*  ALBUMIN 3.6   No results for input(s): LIPASE, AMYLASE in the last 168 hours. No results for input(s): AMMONIA in the last 168 hours. Coagulation Profile: No results for input(s): INR, PROTIME in the last 168 hours. Cardiac Enzymes: No results for input(s): CKTOTAL, CKMB, CKMBINDEX, TROPONINI in the last 168 hours. BNP (last 3 results) No results for input(s): PROBNP in the last 8760 hours. HbA1C: No results for input(s): HGBA1C in the last 72 hours. CBG: No results for input(s): GLUCAP in the last 168 hours. Lipid Profile: No results for input(s): CHOL, HDL, LDLCALC, TRIG, CHOLHDL, LDLDIRECT in the last 72 hours. Thyroid Function Tests: No results for input(s): TSH, T4TOTAL, FREET4, T3FREE, THYROIDAB in the last 72 hours. Anemia Panel: No results for input(s): VITAMINB12, FOLATE, FERRITIN, TIBC, IRON, RETICCTPCT in the last 72 hours. Sepsis Labs: Recent Labs    Lab 03/04/17 2259  LATICACIDVEN 0.56    Recent Results (from the past 240 hour(s))  Respiratory Panel by PCR     Status: Abnormal   Collection Time: 03/05/17  3:26 PM  Result Value Ref Range Status   Adenovirus NOT DETECTED NOT DETECTED Final   Coronavirus 229E NOT DETECTED NOT DETECTED Final   Coronavirus HKU1 NOT DETECTED NOT DETECTED Final   Coronavirus NL63 NOT DETECTED NOT DETECTED Final   Coronavirus OC43 NOT DETECTED NOT DETECTED Final   Metapneumovirus NOT DETECTED NOT DETECTED Final   Rhinovirus / Enterovirus NOT DETECTED NOT DETECTED Final   Influenza A NOT DETECTED NOT DETECTED Final   Influenza B NOT DETECTED NOT DETECTED Final   Parainfluenza Virus 1 NOT DETECTED NOT DETECTED Final   Parainfluenza Virus 2 NOT DETECTED NOT DETECTED Final   Parainfluenza Virus 3 NOT DETECTED NOT DETECTED Final   Parainfluenza Virus 4 DETECTED (A) NOT DETECTED Final  Respiratory Syncytial Virus NOT DETECTED NOT DETECTED Final   Bordetella pertussis NOT DETECTED NOT DETECTED Final   Chlamydophila pneumoniae NOT DETECTED NOT DETECTED Final   Mycoplasma pneumoniae NOT DETECTED NOT DETECTED Final    Comment: Performed at [facility], [address] [place], [place] [address]  MRSA PCR Screening     Status: None   Collection Time: 03/05/17  3:26 PM  Result Value Ref Range Status   MRSA by PCR NEGATIVE NEGATIVE Final    Comment:        The GeneXpert MRSA Assay (FDA approved for NASAL specimens only), is one component of a comprehensive MRSA colonization surveillance program. It is not intended to diagnose MRSA infection nor to guide or monitor treatment for MRSA infections.          Radiology Studies: Dg Chest 2 View  Result Date: 03/04/2017 CLINICAL DATA:  Worsening cough and dyspnea. EXAM: CHEST  2 VIEW COMPARISON:  Chest CT 02/28/2016 and CXR 11/03/2016, lumbar spine radiographs from 11/03/2016. FINDINGS: Stable cardiomegaly with aortic atherosclerosis. Right  paratracheal soft tissue prominence is stable, consistent with a tortuous ectatic right subclavian artery on prior chest CT. Minimal airspace opacity at the left lung base overlying the costophrenic angle may represent a tiny focus of pneumonia or atelectasis. Old left posterior seventh rib fracture. Vertebral augmentation T12 and L2 with chronic stable moderate compression of L1. IMPRESSION: 1. Cardiomegaly with aortic atherosclerosis. 2. Small focus of airspace opacity in the left lung base suspicious for atelectasis and/or pneumonia. 3. Vertebral augmentation T12 and L2 with chronic moderate compression of L1. Electronically Signed   By: Tollie Ethavid  Kwon M.D.   On: 03/04/2017 22:57        Scheduled Meds: . amiodarone  200 mg Oral Daily  . amitriptyline  75 mg Oral QHS  . arformoterol  15 mcg Nebulization BID  . aspirin EC  81 mg Oral Daily  . budesonide (PULMICORT) nebulizer solution  0.5 mg Nebulization BID  . cholecalciferol  2,000 Units Oral Daily  . diltiazem  300 mg Oral Daily  . enoxaparin (LOVENOX) injection  40 mg Subcutaneous Q24H  . fluticasone  2 spray Each Nare Daily  . hydrochlorothiazide  25 mg Oral Q M,W,F  . HYDROcodone-acetaminophen  1 tablet Oral TID  . ipratropium  0.5 mg Nebulization Q6H  . levalbuterol  0.63 mg Nebulization Q6H  . lisinopril  40 mg Oral Daily  . loratadine  10 mg Oral Daily  . mouth rinse  15 mL Mouth Rinse BID  . methimazole  5 mg Oral Daily  . methylPREDNISolone (SOLU-MEDROL) injection  40 mg Intravenous Daily  . mycophenolate  500 mg Oral BID  . pantoprazole  40 mg Oral Daily  . PARoxetine  40 mg Oral Daily  . polyethylene glycol  17 g Oral BID  . QUEtiapine  12.5 mg Oral QHS  . senna-docusate  1 tablet Oral BID  . traZODone  75 mg Oral QHS   Continuous Infusions: . ceFEPime (MAXIPIME) IV       LOS: 1 day    Time spent: 40 mins    Ramiro Harvestaniel Juriel Cid, MD [facility] Pager [phone]  If 7PM-7AM, please contact  night-coverage [email or web address] Password [email or web address] 03/06/2017, 12:12 PM

## 2017-03-06 NOTE — Care Management Important Message (Deleted)
Important Message  Patient Details  Name: Paula Bird MRN: [id number] Date of Birth: Aug 09, 1925   Medicare Important Message Given:  Yes    Caren MacadamFuller, Rekha Hobbins 03/06/2017, 11:47 AMImportant Message  Patient Details  Name: Paula Bird MRN: [id number] Date of Birth: Aug 09, 1925   Medicare Important Message Given:  Yes    Caren MacadamFuller, Kester Stimpson 03/06/2017, 11:47 AM

## 2017-03-06 NOTE — Evaluation (Signed)
Physical Therapy Evaluation Patient Details Name: Paula Bird MRN: [id number] DOB: December 31, 1925 Today's Date: 03/06/2017   History of Present Illness  83 y.o. female with history of myasthenia gravis, paroxysmal atrial fibrillation, COPD presents to the ER with complaints of shortness of breath and admitted for acute respiratory failure with hypoxia likely from healthcare associated pneumonia and COPD exacerbation   Clinical Impression  Pt admitted with above diagnosis. Pt currently with functional limitations due to the deficits listed below (see PT Problem List).  Pt will benefit from skilled PT to increase their independence and safety with mobility to allow discharge to the venue listed below.  Pt reports being tired and only agreeable to sit EOB today.  Pt plans to return to [facility] upon d/c.  Recommend HHPT at ALF.  Pt states she has rollator and w/c to use for mobility around facility and typically does not require assist from staff.     Follow Up Recommendations Home health PT;Supervision/Assistance - 24 hour    Equipment Recommendations  None recommended by PT    Recommendations for Other Services       Precautions / Restrictions Precautions Precautions: Fall Precaution Comments: monitor sats  Pt had been eating lunch and fallen asleep upon arrival with oxygen nasal cannula on top of her head.  SpO2 on room air at rest 94% and with sitting 91%.  Reapplied O2 [place] prior to departing room as pt was planning to return to sleeping (RN into room to give meds and notified).     Mobility  Bed Mobility Overal bed mobility: Needs Assistance Bed Mobility: Supine to Sit;Sit to Supine     Supine to sit: Min guard;HOB elevated Sit to supine: Min guard;HOB elevated   General bed mobility comments: increased time and effort, Spo2 remained 91-92% on room air  Transfers                 General transfer comment: declined further mobility due to fatigue and desire to return to  supine for napping  Ambulation/Gait                Stairs            Wheelchair Mobility    Modified Rankin (Stroke Patients Only)       Balance Overall balance assessment: Needs assistance         Standing balance support: Bilateral upper extremity supported Standing balance-Leahy Scale: Poor Standing balance comment: requires UE support at baseline, denies recent hx of falls                             Pertinent Vitals/Pain Pain Assessment: No/denies pain    Home Living Family/patient expects to be discharged to:: Assisted living               Home Equipment: Walker - 4 wheels;Wheelchair - manual      Prior Function Level of Independence: Needs assistance   Gait / Transfers Assistance Needed: ambulates with rollator, uses wheelchair for long distances     Comments: pt reports going to bingo and being involved in facilities social activities     Hand Dominance        Extremity/Trunk Assessment        Lower Extremity Assessment Lower Extremity Assessment: Generalized weakness       Communication   Communication: No difficulties  Cognition Arousal/Alertness: Awake/alert Behavior During Therapy: WFL for tasks assessed/performed Overall Cognitive Status: Within Functional  Limits for tasks assessed                                        General Comments      Exercises     Assessment/Plan    PT Assessment Patient needs continued PT services  PT Problem List Decreased strength;Decreased mobility;Decreased activity tolerance;Decreased knowledge of use of DME       PT Treatment Interventions DME instruction;Therapeutic activities;Gait training;Functional mobility training;Therapeutic exercise;Patient/family education    PT Goals (Current goals can be found in the Care Plan section)  Acute Rehab PT Goals PT Goal Formulation: With patient Time For Goal Achievement: 03/13/17 Potential to Achieve  Goals: Good    Frequency Min 3X/week   Barriers to discharge        Co-evaluation               AM-PAC PT "6 Clicks" Daily Activity  Outcome Measure Difficulty turning over in bed (including adjusting bedclothes, sheets and blankets)?: None Difficulty moving from lying on back to sitting on the side of the bed? : A Little Difficulty sitting down on and standing up from a chair with arms (e.g., wheelchair, bedside commode, etc,.)?: A Little Help needed moving to and from a bed to chair (including a wheelchair)?: A Lot Help needed walking in hospital room?: A Lot Help needed climbing 3-5 steps with a railing? : A Lot 6 Click Score: 16    End of Session Equipment Utilized During Treatment: Oxygen Activity Tolerance: Patient tolerated treatment well Patient left: with bed alarm set;in bed;with call bell/phone within reach;with nursing/sitter in room   PT Visit Diagnosis: Difficulty in walking, not elsewhere classified (R26.2)    Time: 6578-46961040-1054 PT Time Calculation (min) (ACUTE ONLY): 14 min   Charges:   PT Evaluation $PT Eval Low Complexity: 1 Low     PT G CodesZenovia Jarred:        Paula Bird, PT, DPT 03/06/2017 Pager: [phone]  Maida SaleLEMYRE,KATHrine E 03/06/2017, 1:37 PM

## 2017-03-06 NOTE — Progress Notes (Signed)
NIF -25/VC 1.1L Pt had good effort.

## 2017-03-07 LAB — CBC WITH DIFFERENTIAL/PLATELET
BASOS ABS: 0 10*3/uL (ref 0.0–0.1)
BASOS PCT: 0 %
Eosinophils Absolute: 0 10*3/uL (ref 0.0–0.7)
Eosinophils Relative: 0 %
HEMATOCRIT: 35.8 % — AB (ref 36.0–46.0)
HEMOGLOBIN: 11.4 g/dL — AB (ref 12.0–15.0)
Lymphocytes Relative: 5 %
Lymphs Abs: 0.4 10*3/uL — ABNORMAL LOW (ref 0.7–4.0)
MCH: 28.9 pg (ref 26.0–34.0)
MCHC: 31.8 g/dL (ref 30.0–36.0)
MCV: 90.9 fL (ref 78.0–100.0)
MONO ABS: 0.5 10*3/uL (ref 0.1–1.0)
Monocytes Relative: 6 %
NEUTROS ABS: 6.7 10*3/uL (ref 1.7–7.7)
NEUTROS PCT: 89 %
Platelets: 167 10*3/uL (ref 150–400)
RBC: 3.94 MIL/uL (ref 3.87–5.11)
RDW: 14.3 % (ref 11.5–15.5)
WBC: 7.6 10*3/uL (ref 4.0–10.5)

## 2017-03-07 LAB — BASIC METABOLIC PANEL
ANION GAP: 7 (ref 5–15)
BUN: 30 mg/dL — ABNORMAL HIGH (ref 6–20)
CALCIUM: 8.9 mg/dL (ref 8.9–10.3)
CO2: 25 mmol/L (ref 22–32)
Chloride: 103 mmol/L (ref 101–111)
Creatinine, Ser: 0.67 mg/dL (ref 0.44–1.00)
GFR calc non Af Amer: >60 mL/min (ref >60)
Glucose, Bld: 140 mg/dL — ABNORMAL HIGH (ref 65–99)
Potassium: 4.1 mmol/L (ref 3.5–5.1)
Sodium: 135 mmol/L (ref 135–145)

## 2017-03-07 MED ORDER — POLYVINYL ALCOHOL 1.4 % OP SOLN
1.0000 [drp] | Freq: Two times a day (BID) | OPHTHALMIC | Status: DC
  Administered 2017-03-07 – 2017-03-13 (×13): 1 [drp] via OPHTHALMIC
  Filled 2017-03-07: qty 15

## 2017-03-07 MED ORDER — SACCHAROMYCES BOULARDII 250 MG PO CAPS
250.0000 mg | ORAL_CAPSULE | Freq: Two times a day (BID) | ORAL | Status: DC
  Administered 2017-03-07 – 2017-03-13 (×13): 250 mg via ORAL
  Filled 2017-03-07 (×13): qty 1

## 2017-03-07 MED ORDER — LUBIPROSTONE 8 MCG PO CAPS
8.0000 ug | ORAL_CAPSULE | Freq: Every day | ORAL | Status: DC
  Administered 2017-03-07 – 2017-03-13 (×7): 8 ug via ORAL
  Filled 2017-03-07 (×7): qty 1

## 2017-03-07 MED ORDER — ZOLPIDEM TARTRATE 5 MG PO TABS
5.0000 mg | ORAL_TABLET | Freq: Once | ORAL | Status: AC
  Administered 2017-03-07: 5 mg via ORAL
  Filled 2017-03-07: qty 1

## 2017-03-07 MED ORDER — ZOLPIDEM TARTRATE 5 MG PO TABS
5.0000 mg | ORAL_TABLET | Freq: Every evening | ORAL | Status: DC | PRN
  Administered 2017-03-07 – 2017-03-10 (×4): 5 mg via ORAL
  Filled 2017-03-07 (×4): qty 1

## 2017-03-07 MED ORDER — SUMATRIPTAN SUCCINATE 50 MG PO TABS
50.0000 mg | ORAL_TABLET | ORAL | Status: DC | PRN
  Filled 2017-03-07: qty 1

## 2017-03-07 NOTE — Progress Notes (Signed)
PROGRESS NOTE    Paula Bird  ZOX:[id number] DOB: [birth year] DOA: 03/04/2017 PCP: Angela Coxasanayaka, Gayani Y, MD   Brief Narrative:  History of myasthenia gravis, paroxysmal atrial fibrillation, COPD presented to the ED with complaints of shortness of breath times 1 week with a productive cough.  Patient noted to be on antibiotics however still with shortness of breath and presented to the ED.  Chest x-ray showed possible pneumonia.  Patient noted to be wheezing bilaterally on examination.  Patient started on antibiotics of Zyvox and cefepime for pneumonia.  Patient also on treatments.   Assessment & Plan:   Principal Problem:   HCAP (healthcare-associated pneumonia) Active Problems:   Obstructive chronic bronchitis with acute exacerbation (HCC)   Infection due to parainfluenza virus 4   Hypertension   Depression   Paroxysmal atrial fibrillation (HCC)   Essential hypertension   Myasthenia gravis (HCC)   Hyperthyroidism  #1 healthcare associated pneumonia secondary to parainfluenza virus #4 Patient presented with worsening shortness of breath, history of myasthenia gravis chest x-ray worrisome for pneumonia and patient noted to have a productive cough ongoing times 1 week, rhonchorous breath sounds.  Patient with some clinical improvement however not at baseline.  Influenza PCR negative.  Respiratory viral panel consistent and positive for parainfluenza type IV.  Patient with significant coarse breath sounds and rhonchi on examination.  Continue Mucinex 1200 mg twice daily, Pulmicort to 0.5mg  twice daily, Brovana, Claritin, Flonase, chest PT.  MRSA PCR was negative and as such IV vancomycin has been discontinued.  Continue empiric IV cefepime.  Follow.  2.  Myasthenia gravis Continue CellCept. NIF and vital capacity being followed per RT.  3.  Hyperthyroidism Continue Tapazole.  4.  Hypertension Continue current regimen of Cardizem, lisinopril.  5.  Mild COPD exacerbation Likely  secondary to problem #1.  Continue scheduled Xopenex nebs, Atrovent scheduled nebs, Pulmicort to 0.5 mg twice daily, Brovana, Claritin, Flonase, chest PT. IV Solu-Medrol taper.  Follow.  6.  Atrial fibrillation Continue amiodarone and Cardizem for rate control.  Not an Anticoagulation candidate secondary to falls.  Aspirin.     DVT prophylaxis: Lovenox Code Status: DNR Family Communication: Updated patient.  No family at bedside. Disposition Plan: Likely home with home health services once clinically improved.   Consultants:   None  Procedures:   Chest x-ray 03/04/2017    Antimicrobials:   IV cefepime 03/04/2017  IV vancomycin 03/04/2017>>>> 03/05/2017  IV Zyvox x1 dose 03/05/2017   Subjective: She is sleeping however easily arousable.  Patient states unable to sleep well overnight.  Patient states mild improvement with her shortness of breath.  Patient still very rhonchorous.  No chest pain.    Objective: Vitals:   03/07/17 0108 03/07/17 0538 03/07/17 0837 03/07/17 0841  BP:  129/79    Pulse:  60    Resp:  16    Temp:  98 F (36.7 C)    TempSrc:  Oral    SpO2: 95% 97% 98% 98%  Weight:  66.7 kg (147 lb)    Height:        Intake/Output Summary (Last 24 hours) at 03/07/2017 1037 Last data filed at 03/07/2017 0600 Gross per 24 hour  Intake 290 ml  Output 550 ml  Net -260 ml   Filed Weights   03/05/17 1453 03/06/17 0544 03/07/17 0538  Weight: 65.8 kg (145 lb 1 oz) 66 kg (145 lb 8.1 oz) 66.7 kg (147 lb)    Examination:  General exam: Appears calm and comfortable  Respiratory system: Coarse diffuse rhonchorous breath sounds.  Minimal wheezing.  No crackles.  Speaking in full sentences.  Respiratory effort normal. Cardiovascular system: S1 & S2 heard, RRR. No JVD, murmurs, rubs, gallops or clicks. No pedal edema. Gastrointestinal system: Abdomen is, nontender, nondistended, positive bowel sounds. No organomegaly or masses felt.  Central nervous system:  Alert and oriented. No focal neurological deficits. Extremities: Symmetric 5 x 5 power. Skin: No rashes, lesions or ulcers Psychiatry: Judgement and insight appear normal. Mood & affect appropriate.     Data Reviewed: I have personally reviewed following labs and imaging studies  CBC: Recent Labs  Lab 03/04/17 2247 03/05/17 0121 03/05/17 0459 03/06/17 0415 03/07/17 0525  WBC 8.4 7.0 7.0 6.8 7.6  NEUTROABS 6.2  --   --  6.2 6.7  HGB 12.7 10.4* 11.6* 11.4* 11.4*  HCT 39.6 33.4* 36.6 35.5* 35.8*  MCV 90.8 93.6 90.8 90.6 90.9  PLT 157 140* 170 202 167   Basic Metabolic Panel: Recent Labs  Lab 03/04/17 2247 03/05/17 0121 03/05/17 0459 03/06/17 0415 03/07/17 0525  NA 135  --  137 134* 135  K 4.1  --  3.8 3.9 4.1  CL 103  --  106 103 103  CO2 25  --  23 23 25   GLUCOSE 107*  --  120* 147* 140*  BUN 36*  --  30* 26* 30*  CREATININE 0.89 0.86 0.74 0.69 0.67  CALCIUM 9.0  --  8.6* 8.5* 8.9  MG  --   --   --  2.2  --    GFR: Estimated Creatinine Clearance: 40.6 mL/min (by C-G formula based on SCr of 0.67 mg/dL). Liver Function Tests: Recent Labs  Lab 03/06/17 0415  AST 27  ALT 43  ALKPHOS 40  BILITOT 0.5  PROT 5.9*  ALBUMIN 3.6   No results for input(s): LIPASE, AMYLASE in the last 168 hours. No results for input(s): AMMONIA in the last 168 hours. Coagulation Profile: No results for input(s): INR, PROTIME in the last 168 hours. Cardiac Enzymes: No results for input(s): CKTOTAL, CKMB, CKMBINDEX, TROPONINI in the last 168 hours. BNP (last 3 results) No results for input(s): PROBNP in the last 8760 hours. HbA1C: No results for input(s): HGBA1C in the last 72 hours. CBG: No results for input(s): GLUCAP in the last 168 hours. Lipid Profile: No results for input(s): CHOL, HDL, LDLCALC, TRIG, CHOLHDL, LDLDIRECT in the last 72 hours. Thyroid Function Tests: No results for input(s): TSH, T4TOTAL, FREET4, T3FREE, THYROIDAB in the last 72 hours. Anemia Panel: No  results for input(s): VITAMINB12, FOLATE, FERRITIN, TIBC, IRON, RETICCTPCT in the last 72 hours. Sepsis Labs: Recent Labs  Lab 03/04/17 2259  LATICACIDVEN 0.56    Recent Results (from the past 240 hour(s))  Blood culture (routine x 2)     Status: None (Preliminary result)   Collection Time: 03/04/17 10:47 PM  Result Value Ref Range Status   Specimen Description BLOOD RIGHT HAND  Final   Special Requests   Final    BOTTLES DRAWN AEROBIC AND ANAEROBIC Blood Culture adequate volume   Culture   Final    NO GROWTH 1 DAY Performed at [facility], [address] [place], [place] [address]    Report Status PENDING  Incomplete  Blood culture (routine x 2)     Status: None (Preliminary result)   Collection Time: 03/04/17 10:47 PM  Result Value Ref Range Status   Specimen Description BLOOD LEFT ANTECUBITAL  Final   Special  Requests   Final    BOTTLES DRAWN AEROBIC AND ANAEROBIC Blood Culture adequate volume   Culture   Final    NO GROWTH 1 DAY Performed at [facility], [address], [place], [place] [address]    Report Status PENDING  Incomplete  Respiratory Panel by PCR     Status: Abnormal   Collection Time: 03/05/17  3:26 PM  Result Value Ref Range Status   Adenovirus NOT DETECTED NOT DETECTED Final   Coronavirus 229E NOT DETECTED NOT DETECTED Final   Coronavirus HKU1 NOT DETECTED NOT DETECTED Final   Coronavirus NL63 NOT DETECTED NOT DETECTED Final   Coronavirus OC43 NOT DETECTED NOT DETECTED Final   Metapneumovirus NOT DETECTED NOT DETECTED Final   Rhinovirus / Enterovirus NOT DETECTED NOT DETECTED Final   Influenza A NOT DETECTED NOT DETECTED Final   Influenza B NOT DETECTED NOT DETECTED Final   Parainfluenza Virus 1 NOT DETECTED NOT DETECTED Final   Parainfluenza Virus 2 NOT DETECTED NOT DETECTED Final   Parainfluenza Virus 3 NOT DETECTED NOT DETECTED Final   Parainfluenza Virus 4 DETECTED (A) NOT DETECTED Final   Respiratory Syncytial Virus NOT DETECTED  NOT DETECTED Final   Bordetella pertussis NOT DETECTED NOT DETECTED Final   Chlamydophila pneumoniae NOT DETECTED NOT DETECTED Final   Mycoplasma pneumoniae NOT DETECTED NOT DETECTED Final    Comment: Performed at [facility], [address] [place], [place] [address]  MRSA PCR Screening     Status: None   Collection Time: 03/05/17  3:26 PM  Result Value Ref Range Status   MRSA by PCR NEGATIVE NEGATIVE Final    Comment:        The GeneXpert MRSA Assay (FDA approved for NASAL specimens only), is one component of a comprehensive MRSA colonization surveillance program. It is not intended to diagnose MRSA infection nor to guide or monitor treatment for MRSA infections.          Radiology Studies: No results found.      Scheduled Meds: . amiodarone  200 mg Oral Daily  . amitriptyline  75 mg Oral QHS  . arformoterol  15 mcg Nebulization BID  . aspirin EC  81 mg Oral Daily  . budesonide (PULMICORT) nebulizer solution  0.5 mg Nebulization BID  . cholecalciferol  2,000 Units Oral Daily  . diltiazem  300 mg Oral Daily  . enoxaparin (LOVENOX) injection  40 mg Subcutaneous Q24H  . fluticasone  2 spray Each Nare Daily  . guaiFENesin  1,200 mg Oral BID  . hydrochlorothiazide  25 mg Oral Q M,W,F  . HYDROcodone-acetaminophen  1 tablet Oral TID  . ipratropium  0.5 mg Nebulization Q6H  . levalbuterol  0.63 mg Nebulization Q6H  . lisinopril  40 mg Oral Daily  . loratadine  10 mg Oral Daily  . mouth rinse  15 mL Mouth Rinse BID  . methimazole  5 mg Oral Daily  . methylPREDNISolone (SOLU-MEDROL) injection  40 mg Intravenous Daily  . mycophenolate  500 mg Oral BID  . pantoprazole  40 mg Oral Daily  . PARoxetine  40 mg Oral Daily  . polyethylene glycol  17 g Oral BID  . QUEtiapine  12.5 mg Oral QHS  . senna-docusate  1 tablet Oral BID  . traZODone  75 mg Oral QHS   Continuous Infusions: . ceFEPime (MAXIPIME) IV Stopped (03/07/17 0100)     LOS: 2 days    Time  spent: 35 mins  Ramiro Harvest, MD [facility] Pager [phone]  If 7PM-7AM, please contact night-coverage [email or web address] Password [facility] 03/07/2017, 10:37 AM

## 2017-03-07 NOTE — Progress Notes (Signed)
Nif -40 FVC 1.3 L

## 2017-03-07 NOTE — Progress Notes (Signed)
Physical Therapy Treatment Patient Details Name: Paula Bird MRN: [id number] DOB: 06-Dec-1925 Today's Date: 03/07/2017    History of Present Illness 83 y.o. female with history of myasthenia gravis, paroxysmal atrial fibrillation, COPD presents to the ER with complaints of shortness of breath and admitted for acute respiratory failure with hypoxia likely from healthcare associated pneumonia and COPD exacerbation     PT Comments    Pt assisted with ambulating short distance in hallway.  Pt reports feeling tired and fatigued today.  RT into room end of session.  Follow Up Recommendations  Home health PT;Supervision/Assistance - 24 hour     Equipment Recommendations  None recommended by PT    Recommendations for Other Services       Precautions / Restrictions Precautions Precautions: Fall Precaution Comments: monitor sats    Mobility  Bed Mobility Overal bed mobility: Needs Assistance Bed Mobility: Supine to Sit;Sit to Supine     Supine to sit: Min guard;HOB elevated Sit to supine: Min guard;HOB elevated   General bed mobility comments: increased time and effort  Transfers Overall transfer level: Needs assistance Equipment used: Rolling walker (2 wheeled) Transfers: Sit to/from Stand Sit to Stand: Min assist         General transfer comment: assist for rise and steady with posterior LOB  Ambulation/Gait Ambulation/Gait assistance: Min assist Ambulation Distance (Feet): 40 Feet Assistive device: Rolling walker (2 wheeled) Gait Pattern/deviations: Step-through pattern;Decreased stride length     General Gait Details: verbal cues for safe use of RW (typically uses rollator), SPO2 91% on room air (RN notified, pt receiving breathing tx end of session)   Stairs            Wheelchair Mobility    Modified Rankin (Stroke Patients Only)       Balance                                            Cognition Arousal/Alertness:  Awake/alert Behavior During Therapy: WFL for tasks assessed/performed Overall Cognitive Status: Within Functional Limits for tasks assessed                                        Exercises      General Comments        Pertinent Vitals/Pain Pain Assessment: No/denies pain    Home Living                      Prior Function            PT Goals (current goals can now be found in the care plan section) Progress towards PT goals: Progressing toward goals    Frequency    Min 3X/week      PT Plan Current plan remains appropriate    Co-evaluation              AM-PAC PT "6 Clicks" Daily Activity  Outcome Measure  Difficulty turning over in bed (including adjusting bedclothes, sheets and blankets)?: None Difficulty moving from lying on back to sitting on the side of the bed? : A Little Difficulty sitting down on and standing up from a chair with arms (e.g., wheelchair, bedside commode, etc,.)?: A Little Help needed moving to and from a bed to chair (including a wheelchair)?: A Little  Help needed walking in hospital room?: A Little Help needed climbing 3-5 steps with a railing? : A Lot 6 Click Score: 18    End of Session   Activity Tolerance: Patient tolerated treatment well Patient left: in bed;with call bell/phone within reach;with family/visitor present;Other (comment)(RT in room)   PT Visit Diagnosis: Difficulty in walking, not elsewhere classified (R26.2)     Time: 1610-96041346-1402 PT Time Calculation (min) (ACUTE ONLY): 16 min  Charges:  $Gait Training: 8-22 mins                    G Codes:       Zenovia JarredKati Sharnise Blough, PT, DPT 03/07/2017 Pager: [phone]  Maida SaleLEMYRE,KATHrine E 03/07/2017, 3:38 PM

## 2017-03-07 NOTE — Progress Notes (Signed)
Pt refused to do vest therapy this am. Pt stated she will do later. Pt still resting. No distress noted at this time.

## 2017-03-07 NOTE — Progress Notes (Signed)
RT Notes: Pt. was able to obtain NIF of (-30)cmh20/FVC of (1.2)L, good effort with frequent loose/occational productive thick tan/green sputum, per pt. Is ,"less amount than it was".

## 2017-03-08 LAB — CBC WITH DIFFERENTIAL/PLATELET
BASOS ABS: 0 10*3/uL (ref 0.0–0.1)
BASOS PCT: 0 %
EOS ABS: 0 10*3/uL (ref 0.0–0.7)
Eosinophils Relative: 0 %
HEMATOCRIT: 35.6 % — AB (ref 36.0–46.0)
Hemoglobin: 11.2 g/dL — ABNORMAL LOW (ref 12.0–15.0)
Lymphocytes Relative: 7 %
Lymphs Abs: 0.5 10*3/uL — ABNORMAL LOW (ref 0.7–4.0)
MCH: 28.7 pg (ref 26.0–34.0)
MCHC: 31.5 g/dL (ref 30.0–36.0)
MCV: 91.3 fL (ref 78.0–100.0)
MONO ABS: 0.6 10*3/uL (ref 0.1–1.0)
MONOS PCT: 8 %
NEUTROS ABS: 6.2 10*3/uL (ref 1.7–7.7)
NEUTROS PCT: 85 %
PLATELETS: 158 10*3/uL (ref 150–400)
RBC: 3.9 MIL/uL (ref 3.87–5.11)
RDW: 14.6 % (ref 11.5–15.5)
WBC: 7.3 10*3/uL (ref 4.0–10.5)

## 2017-03-08 LAB — BASIC METABOLIC PANEL
ANION GAP: 6 (ref 5–15)
BUN: 28 mg/dL — ABNORMAL HIGH (ref 6–20)
CALCIUM: 8.9 mg/dL (ref 8.9–10.3)
CO2: 27 mmol/L (ref 22–32)
CREATININE: 0.82 mg/dL (ref 0.44–1.00)
Chloride: 102 mmol/L (ref 101–111)
Glucose, Bld: 133 mg/dL — ABNORMAL HIGH (ref 65–99)
Potassium: 4.5 mmol/L (ref 3.5–5.1)
SODIUM: 135 mmol/L (ref 135–145)

## 2017-03-08 LAB — GLUCOSE, CAPILLARY
GLUCOSE-CAPILLARY: 163 mg/dL — AB (ref 65–99)
Glucose-Capillary: 123 mg/dL — ABNORMAL HIGH (ref 65–99)

## 2017-03-08 MED ORDER — IPRATROPIUM BROMIDE 0.02 % IN SOLN
0.5000 mg | Freq: Three times a day (TID) | RESPIRATORY_TRACT | Status: DC
  Administered 2017-03-08 – 2017-03-13 (×16): 0.5 mg via RESPIRATORY_TRACT
  Filled 2017-03-08 (×16): qty 2.5

## 2017-03-08 MED ORDER — CEFEPIME HCL 1 G IJ SOLR
1.0000 g | Freq: Two times a day (BID) | INTRAMUSCULAR | Status: DC
  Filled 2017-03-08: qty 1

## 2017-03-08 MED ORDER — LEVALBUTEROL HCL 1.25 MG/0.5ML IN NEBU
1.2500 mg | INHALATION_SOLUTION | Freq: Three times a day (TID) | RESPIRATORY_TRACT | Status: DC
  Administered 2017-03-08 – 2017-03-13 (×16): 1.25 mg via RESPIRATORY_TRACT
  Filled 2017-03-08 (×16): qty 0.5

## 2017-03-08 MED ORDER — AMOXICILLIN-POT CLAVULANATE 875-125 MG PO TABS
1.0000 | ORAL_TABLET | Freq: Two times a day (BID) | ORAL | Status: DC
  Administered 2017-03-08 – 2017-03-11 (×7): 1 via ORAL
  Filled 2017-03-08 (×7): qty 1

## 2017-03-08 MED ORDER — PREDNISONE 20 MG PO TABS
20.0000 mg | ORAL_TABLET | Freq: Every day | ORAL | Status: AC
  Administered 2017-03-09 – 2017-03-11 (×3): 20 mg via ORAL
  Filled 2017-03-08 (×3): qty 1

## 2017-03-08 NOTE — Care Management Note (Signed)
Case Management Note  Patient Details  Name: Paula Bird MRN: [id number] Date of Birth: Jun 11, 1925  Subjective/Objective: PT-recc HH. Ordered for HHPT/OT/aide/sw/respiratory care-[phone] where patient currently lives-spoke to Paula Bird about HHC-they use [facility]-Faxed w/confirmation to fax#[phone] HHC orders. Paula AshingJim will have Paula Gratesngela Bird to contact CSW Paula Bird for assessment.                   Action/Plan:d/c pla return back to ALF w/HHC.   Expected Discharge Date:  (UNKNOWN)               Expected Discharge Plan:  Home w Home Health Services  In-House Referral:  Clinical Social Work  Discharge planning Services  CM Consult  Post Acute Care Choice:    Choice offered to:     DME Arranged:    DME Agency:     HH Arranged:  PT, OT, Nurse's Aide, Social Work [facility]  Status of Service:  In process, will continue to follow  If discussed at Long Length of Stay Meetings, dates discussed:    Additional Comments:  Paula Bird, Paula Mccannon, RN 03/08/2017, 3:07 PM

## 2017-03-08 NOTE — Progress Notes (Signed)
Holding CPT per patient request. NIF: -35 cmH2O  VC: 1.1L

## 2017-03-08 NOTE — Progress Notes (Signed)
Physical Therapy Treatment Patient Details Name: Paula Bird MRN: [id number] DOB: 10-Sep-1925 Today's Date: 03/08/2017    History of Present Illness 83 y.o. female with history of myasthenia gravis, paroxysmal atrial fibrillation, COPD presents to the ER with complaints of shortness of breath and admitted for acute respiratory failure with hypoxia likely from healthcare associated pneumonia and COPD exacerbation     PT Comments    Pt continues to reports not sleeping and feeling tired however agreeable to ambulate.  SpO2 remained 89-91% on room air during ambulation.  Follow Up Recommendations  Home health PT;Supervision/Assistance - 24 hour     Equipment Recommendations  None recommended by PT    Recommendations for Other Services       Precautions / Restrictions Precautions Precautions: Fall Precaution Comments: monitor sats    Mobility  Bed Mobility Overal bed mobility: Needs Assistance Bed Mobility: Supine to Sit;Sit to Supine     Supine to sit: Min guard;HOB elevated Sit to supine: Min guard;HOB elevated   General bed mobility comments: increased time and effort  Transfers Overall transfer level: Needs assistance Equipment used: Rolling walker (2 wheeled) Transfers: Sit to/from Stand Sit to Stand: Min assist         General transfer comment: assist for rise and steady with posterior LOB  Ambulation/Gait Ambulation/Gait assistance: Min guard Ambulation Distance (Feet): 60 Feet Assistive device: Rolling walker (2 wheeled) Gait Pattern/deviations: Step-through pattern;Decreased stride length     General Gait Details: verbal cues for safe use of RW (typically uses rollator), SPO2 89-91% on room air    Stairs            Wheelchair Mobility    Modified Rankin (Stroke Patients Only)       Balance                                            Cognition Arousal/Alertness: Awake/alert Behavior During Therapy: WFL for tasks  assessed/performed Overall Cognitive Status: Within Functional Limits for tasks assessed                                        Exercises      General Comments        Pertinent Vitals/Pain Pain Assessment: No/denies pain    Home Living                      Prior Function            PT Goals (current goals can now be found in the care plan section) Progress towards PT goals: Progressing toward goals    Frequency    Min 3X/week      PT Plan Current plan remains appropriate    Co-evaluation              AM-PAC PT "6 Clicks" Daily Activity  Outcome Measure  Difficulty turning over in bed (including adjusting bedclothes, sheets and blankets)?: None Difficulty moving from lying on back to sitting on the side of the bed? : A Little Difficulty sitting down on and standing up from a chair with arms (e.g., wheelchair, bedside commode, etc,.)?: A Little Help needed moving to and from a bed to chair (including a wheelchair)?: A Little Help needed walking in hospital room?: A Little Help  needed climbing 3-5 steps with a railing? : A Little 6 Click Score: 19    End of Session Equipment Utilized During Treatment: Oxygen Activity Tolerance: Patient tolerated treatment well Patient left: in bed;with call bell/phone within reach;with bed alarm set   PT Visit Diagnosis: Difficulty in walking, not elsewhere classified (R26.2)     Time: [id number] PT Time Calculation (min) (ACUTE ONLY): 12 min  Charges:  $Gait Training: 8-22 mins                    G Codes:       Zenovia JarredKati Teliah [place], PT, DPT 03/08/2017 Pager: [phone]  Maida SaleLEMYRE,KATHrine E 03/08/2017, 1:31 PM

## 2017-03-08 NOTE — Progress Notes (Signed)
Patient requesting to wait on CPT at this time. Will attempt at next scheduled time.

## 2017-03-08 NOTE — Progress Notes (Signed)
PROGRESS NOTE    Paula Bird  WUJ:[id number] DOB: Apr 06, 1925 DOA: 03/04/2017 PCP: Angela Coxasanayaka, Gayani Y, MD   Brief Narrative:  History of myasthenia gravis, paroxysmal atrial fibrillation, COPD presented to the ED with complaints of shortness of breath times 1 week with a productive cough.  Patient noted to be on antibiotics however still with shortness of breath and presented to the ED.  Chest x-ray showed possible pneumonia.  Patient noted to be wheezing bilaterally on examination.  Patient started on antibiotics of Zyvox and cefepime for pneumonia.  Patient also on treatments.   Assessment & Plan:   Principal Problem:   HCAP (healthcare-associated pneumonia) Active Problems:   Obstructive chronic bronchitis with acute exacerbation (HCC)   Infection due to parainfluenza virus 4   Hypertension   Depression   Paroxysmal atrial fibrillation (HCC)   Essential hypertension   Myasthenia gravis (HCC)   Hyperthyroidism  #1 healthcare associated pneumonia secondary to parainfluenza virus #4 Patient presented with worsening shortness of breath, history of myasthenia gravis chest x-ray worrisome for pneumonia and patient noted to have a productive cough ongoing times 1 week, rhonchorous breath sounds.  Patient improving slowly on a daily basis however not at baseline yet. Influenza PCR negative.  Respiratory viral panel consistent and positive for parainfluenza type IV.  Patient with improvement with rhonchorous and coarse breath sounds on examination. Continue Mucinex 1200 mg twice daily, Pulmicort, Brovana, Claritin, Flonase, chest PT.  MRSA PCR was negative and as such IV vancomycin has been discontinued.  Discontinue IV cefepime.  Start oral Augmentin to complete course of antibiotic treatment.   2.  Myasthenia gravis Continue CellCept. NIF and vital capacity being followed per RT.  3.  Hyperthyroidism Continue Tapazole.  4.  Hypertension Continue current regimen of Cardizem,  lisinopril.  5.  Mild COPD exacerbation Likely secondary to problem #1.  Continue scheduled Xopenex nebs, Atrovent scheduled nebs, Pulmicort to 0.5 mg twice daily, Brovana, Claritin, Flonase, chest PT. change IV Solu-Medrol to oral prednisone taper. Switch IV antibiotics to oral antibiotics.  Follow.  6.  Atrial fibrillation Currently rate controlled on amiodarone and Cardizem.  Not an Anticoagulation candidate secondary to falls.  Aspirin.     DVT prophylaxis: Lovenox Code Status: DNR Family Communication: Updated patient.  No family at bedside. Disposition Plan: Likely home with home health services once clinically improved.   Consultants:   None  Procedures:   Chest x-ray 03/04/2017    Antimicrobials:   IV cefepime 03/04/2017>>>>> 03/08/2017  IV vancomycin 03/04/2017>>>> 03/05/2017  IV Zyvox x1 dose 03/05/2017  Augmentin 03/08/2017   Subjective: Patient sitting up in bed.  Patient states feeling better than yesterday.  Patient states congestion and rhonchorous breath sounds improving, however not at baseline yet.  Patient denies any chest pain.  Patient states cough is improving.   Objective: Vitals:   03/07/17 2015 03/07/17 2130 03/08/17 0537 03/08/17 0742  BP:  (!) 115/43 135/65   Pulse:  63 (!) 59 60  Resp:  16 16 18   Temp:  98 F (36.7 C) 97.7 F (36.5 C)   TempSrc:  Oral Oral   SpO2: 91% 95% 95% 95%  Weight:   67 kg (147 lb 11.3 oz)   Height:        Intake/Output Summary (Last 24 hours) at 03/08/2017 1006 Last data filed at 03/08/2017 0600 Gross per 24 hour  Intake 170 ml  Output 750 ml  Net -580 ml   Filed Weights   03/06/17 0544 03/07/17 0538 03/08/17  0537  Weight: 66 kg (145 lb 8.1 oz) 66.7 kg (147 lb) 67 kg (147 lb 11.3 oz)    Examination:  General exam: Appears calm and comfortable  Respiratory system: Less rhonchorous diffuse breath sounds.  No crackles.  Respiratory effort normal. Cardiovascular system: S1 & S2 heard, RRR. No JVD,  murmurs, rubs, gallops or clicks. No pedal edema. Gastrointestinal system: Abdomen is soft, nondistended, nontender, positive bowel sounds, no organomegaly, no rebound, no guarding.  Central nervous system: Alert and oriented. No focal neurological deficits. Extremities: Symmetric 5 x 5 power. Skin: No rashes, lesions or ulcers Psychiatry: Judgement and insight appear normal. Mood & affect appropriate.     Data Reviewed: I have personally reviewed following labs and imaging studies  CBC: Recent Labs  Lab 03/04/17 2247 03/05/17 0121 03/05/17 0459 03/06/17 0415 03/07/17 0525 03/08/17 0517  WBC 8.4 7.0 7.0 6.8 7.6 7.3  NEUTROABS 6.2  --   --  6.2 6.7 6.2  HGB 12.7 10.4* 11.6* 11.4* 11.4* 11.2*  HCT 39.6 33.4* 36.6 35.5* 35.8* 35.6*  MCV 90.8 93.6 90.8 90.6 90.9 91.3  PLT 157 140* 170 202 167 158   Basic Metabolic Panel: Recent Labs  Lab 03/04/17 2247 03/05/17 0121 03/05/17 0459 03/06/17 0415 03/07/17 0525 03/08/17 0517  NA 135  --  137 134* 135 135  K 4.1  --  3.8 3.9 4.1 4.5  CL 103  --  106 103 103 102  CO2 25  --  23 23 25 27   GLUCOSE 107*  --  120* 147* 140* 133*  BUN 36*  --  30* 26* 30* 28*  CREATININE 0.89 0.86 0.74 0.69 0.67 0.82  CALCIUM 9.0  --  8.6* 8.5* 8.9 8.9  MG  --   --   --  2.2  --   --    GFR: Estimated Creatinine Clearance: 39.7 mL/min (by C-G formula based on SCr of 0.82 mg/dL). Liver Function Tests: Recent Labs  Lab 03/06/17 0415  AST 27  ALT 43  ALKPHOS 40  BILITOT 0.5  PROT 5.9*  ALBUMIN 3.6   No results for input(s): LIPASE, AMYLASE in the last 168 hours. No results for input(s): AMMONIA in the last 168 hours. Coagulation Profile: No results for input(s): INR, PROTIME in the last 168 hours. Cardiac Enzymes: No results for input(s): CKTOTAL, CKMB, CKMBINDEX, TROPONINI in the last 168 hours. BNP (last 3 results) No results for input(s): PROBNP in the last 8760 hours. HbA1C: No results for input(s): HGBA1C in the last 72  hours. CBG: No results for input(s): GLUCAP in the last 168 hours. Lipid Profile: No results for input(s): CHOL, HDL, LDLCALC, TRIG, CHOLHDL, LDLDIRECT in the last 72 hours. Thyroid Function Tests: No results for input(s): TSH, T4TOTAL, FREET4, T3FREE, THYROIDAB in the last 72 hours. Anemia Panel: No results for input(s): VITAMINB12, FOLATE, FERRITIN, TIBC, IRON, RETICCTPCT in the last 72 hours. Sepsis Labs: Recent Labs  Lab 03/04/17 2259  LATICACIDVEN 0.56    Recent Results (from the past 240 hour(s))  Blood culture (routine x 2)     Status: None (Preliminary result)   Collection Time: 03/04/17 10:47 PM  Result Value Ref Range Status   Specimen Description BLOOD RIGHT HAND  Final   Special Requests   Final    BOTTLES DRAWN AEROBIC AND ANAEROBIC Blood Culture adequate volume   Culture   Final    NO GROWTH 2 DAYS Performed at [facility], [address] [place], [place] [address]  Report Status PENDING  Incomplete  Blood culture (routine x 2)     Status: None (Preliminary result)   Collection Time: 03/04/17 10:47 PM  Result Value Ref Range Status   Specimen Description BLOOD LEFT ANTECUBITAL  Final   Special Requests   Final    BOTTLES DRAWN AEROBIC AND ANAEROBIC Blood Culture adequate volume   Culture   Final    NO GROWTH 2 DAYS Performed at [facility], [address], [place], [place] [address]    Report Status PENDING  Incomplete  Respiratory Panel by PCR     Status: Abnormal   Collection Time: 03/05/17  3:26 PM  Result Value Ref Range Status   Adenovirus NOT DETECTED NOT DETECTED Final   Coronavirus 229E NOT DETECTED NOT DETECTED Final   Coronavirus HKU1 NOT DETECTED NOT DETECTED Final   Coronavirus NL63 NOT DETECTED NOT DETECTED Final   Coronavirus OC43 NOT DETECTED NOT DETECTED Final   Metapneumovirus NOT DETECTED NOT DETECTED Final   Rhinovirus / Enterovirus NOT DETECTED NOT DETECTED Final   Influenza A NOT DETECTED NOT DETECTED Final    Influenza B NOT DETECTED NOT DETECTED Final   Parainfluenza Virus 1 NOT DETECTED NOT DETECTED Final   Parainfluenza Virus 2 NOT DETECTED NOT DETECTED Final   Parainfluenza Virus 3 NOT DETECTED NOT DETECTED Final   Parainfluenza Virus 4 DETECTED (A) NOT DETECTED Final   Respiratory Syncytial Virus NOT DETECTED NOT DETECTED Final   Bordetella pertussis NOT DETECTED NOT DETECTED Final   Chlamydophila pneumoniae NOT DETECTED NOT DETECTED Final   Mycoplasma pneumoniae NOT DETECTED NOT DETECTED Final    Comment: Performed at [facility], [address], [place], [place] [address]  MRSA PCR Screening     Status: None   Collection Time: 03/05/17  3:26 PM  Result Value Ref Range Status   MRSA by PCR NEGATIVE NEGATIVE Final    Comment:        The GeneXpert MRSA Assay (FDA approved for NASAL specimens only), is one component of a comprehensive MRSA colonization surveillance program. It is not intended to diagnose MRSA infection nor to guide or monitor treatment for MRSA infections.          Radiology Studies: No results found.      Scheduled Meds: . amiodarone  200 mg Oral Daily  . amitriptyline  75 mg Oral QHS  . amoxicillin-clavulanate  1 tablet Oral Q12H  . arformoterol  15 mcg Nebulization BID  . aspirin EC  81 mg Oral Daily  . budesonide (PULMICORT) nebulizer solution  0.5 mg Nebulization BID  . cholecalciferol  2,000 Units Oral Daily  . diltiazem  300 mg Oral Daily  . enoxaparin (LOVENOX) injection  40 mg Subcutaneous Q24H  . fluticasone  2 spray Each Nare Daily  . guaiFENesin  1,200 mg Oral BID  . hydrochlorothiazide  25 mg Oral Q M,W,F  . HYDROcodone-acetaminophen  1 tablet Oral TID  . ipratropium  0.5 mg Nebulization TID  . levalbuterol  1.25 mg Nebulization TID  . lisinopril  40 mg Oral Daily  . loratadine  10 mg Oral Daily  . lubiprostone  8 mcg Oral Q breakfast  . mouth rinse  15 mL Mouth Rinse BID  . methimazole  5 mg Oral Daily  .  methylPREDNISolone (SOLU-MEDROL) injection  40 mg Intravenous Daily  . mycophenolate  500 mg Oral BID  . pantoprazole  40 mg Oral Daily  . PARoxetine  40 mg Oral Daily  .  polyethylene glycol  17 g Oral BID  . polyvinyl alcohol  1 drop Both Eyes BID  . QUEtiapine  12.5 mg Oral QHS  . saccharomyces boulardii  250 mg Oral BID  . senna-docusate  1 tablet Oral BID  . traZODone  75 mg Oral QHS   Continuous Infusions:    LOS: 3 days    Time spent: 35 mins    Ramiro Harvest, MD [facility] Pager [phone]  If 7PM-7AM, please contact night-coverage [email or web address] Password [email or web address] 03/08/2017, 10:06 AM

## 2017-03-08 NOTE — Clinical Social Work Note (Signed)
Clinical Social Work Assessment  Patient Details  Name: Paula Bird MRN: [id number] Date of Birth: 11/26/1925  Date of referral:  03/08/17               Reason for consult:  Facility Placement, Discharge Planning                Permission sought to share information with:  Oceanographeracility Contact Representative Permission granted to share information::  Yes, Verbal Permission Granted  Name::        Agency::  [facility]  Relationship::     Contact Information:     Housing/Transportation Living arrangements for the past 2 months:  [facility] Source of Information:  Patient Patient Interpreter Needed:  None Criminal Activity/Legal Involvement Pertinent to Current Situation/Hospitalization:  No - Comment as needed Significant Relationships:  Adult Children Lives with:  Facility Resident Do you feel safe going back to the place where you live?  Yes Need for family participation in patient care:  No (Coment)  Care giving concerns:  [facility]. Patient reported that she has been at ALF almost 3 years. Patient reported that she uses a wheelchair at ALF and that she tries to walk daily. PT recommending HHPT.    Social Worker assessment / plan:  CSW spoke with patient at bedside regarding discharge planning. Patient verbalized plan to [facility] when medically stable.   CSW [facility] and informed staff that patient planned to dc back to facility when medically stable. Staff member Rosanne AshingJim reported that staff will come out to assess patient and reported that they have spoken with patient's RNCM regarding patient's home health service needs.  CSW will complete FL2 and continue to follow and assist patient with discharge planning.   Employment status:  Retired Database administratornsurance information:  Managed Medicare PT Recommendations:  Home with Home Health Information / Referral to community  resources:  (Patient from ALF)  Patient/Family's Response to care:  Patient agreeable to return to current ALF. Patient  Patient/Family's Understanding of and Emotional Response to Diagnosis, Current Treatment, and Prognosis:  Patient presented calm and verbalized understanding of diagnosis. CSW and patient discussed patient's support system. Patient reports that she has a son that lives locally who she speaks with often. Patient verbalized plan to dc back to current ALF.    Emotional Assessment Appearance:  Appears stated age Attitude/Demeanor/Rapport:  Other(Cooperative) Affect (typically observed):  Appropriate, Calm Orientation:  Oriented to Self, Oriented to Place, Oriented to Situation Alcohol / Substance use:  Not Applicable Psych involvement (Current and /or in the community):  No (Comment)  Discharge Needs  Concerns to be addressed:  Care Coordination Readmission within the last 30 days:  No Current discharge risk:  None Barriers to Discharge:  Continued Medical Work up   USG CorporationKimberly L Nekesha Font, LCSW 03/08/2017, 2:53 PM

## 2017-03-08 NOTE — Progress Notes (Signed)
Pt. wants to sleep for this evening, loose cough, minimal congestion, Rhonchi.

## 2017-03-09 LAB — CBC WITH DIFFERENTIAL/PLATELET
BASOS ABS: 0 10*3/uL (ref 0.0–0.1)
BASOS PCT: 0 %
Eosinophils Absolute: 0 10*3/uL (ref 0.0–0.7)
Eosinophils Relative: 0 %
HEMATOCRIT: 36.6 % (ref 36.0–46.0)
HEMOGLOBIN: 11.7 g/dL — AB (ref 12.0–15.0)
Lymphocytes Relative: 6 %
Lymphs Abs: 0.4 10*3/uL — ABNORMAL LOW (ref 0.7–4.0)
MCH: 29.2 pg (ref 26.0–34.0)
MCHC: 32 g/dL (ref 30.0–36.0)
MCV: 91.3 fL (ref 78.0–100.0)
Monocytes Absolute: 0.7 10*3/uL (ref 0.1–1.0)
Monocytes Relative: 10 %
NEUTROS ABS: 5.6 10*3/uL (ref 1.7–7.7)
NEUTROS PCT: 84 %
Platelets: 186 10*3/uL (ref 150–400)
RBC: 4.01 MIL/uL (ref 3.87–5.11)
RDW: 14.5 % (ref 11.5–15.5)
WBC: 6.7 10*3/uL (ref 4.0–10.5)

## 2017-03-09 LAB — BASIC METABOLIC PANEL
ANION GAP: 7 (ref 5–15)
BUN: 34 mg/dL — ABNORMAL HIGH (ref 6–20)
CALCIUM: 9.1 mg/dL (ref 8.9–10.3)
CHLORIDE: 101 mmol/L (ref 101–111)
CO2: 26 mmol/L (ref 22–32)
Creatinine, Ser: 0.66 mg/dL (ref 0.44–1.00)
GFR calc non Af Amer: >60 mL/min (ref >60)
Glucose, Bld: 133 mg/dL — ABNORMAL HIGH (ref 65–99)
Potassium: 4.3 mmol/L (ref 3.5–5.1)
Sodium: 134 mmol/L — ABNORMAL LOW (ref 135–145)

## 2017-03-09 MED ORDER — GUAIFENESIN-DM 100-10 MG/5ML PO SYRP
5.0000 mL | ORAL_SOLUTION | ORAL | Status: DC | PRN
  Administered 2017-03-09 – 2017-03-13 (×2): 5 mL via ORAL
  Filled 2017-03-09 (×3): qty 10

## 2017-03-09 MED ORDER — FUROSEMIDE 10 MG/ML IJ SOLN
20.0000 mg | Freq: Once | INTRAMUSCULAR | Status: AC
  Administered 2017-03-09: 20 mg via INTRAVENOUS
  Filled 2017-03-09: qty 2

## 2017-03-09 MED ORDER — SPIRONOLACTONE 25 MG PO TABS
25.0000 mg | ORAL_TABLET | Freq: Every day | ORAL | Status: DC
  Administered 2017-03-09 – 2017-03-13 (×5): 25 mg via ORAL
  Filled 2017-03-09 (×5): qty 1

## 2017-03-09 NOTE — Progress Notes (Signed)
Patient complains of being "tired with no sleep" and declines the use of chest vest at this time. Manual CPT attempted and stopped due to complaints of pain. Flutter valve encouraged. Tolerated well. NIF -40 FVC 1.7L with good effort.

## 2017-03-09 NOTE — Plan of Care (Signed)
  Progressing Clinical Measurements: Ability to maintain a body temperature in the normal range will improve 03/09/2017 2315 - Progressing by Cristela FeltSteffens, Carmisha Larusso P, RN Respiratory: Ability to maintain adequate ventilation will improve 03/09/2017 2315 - Progressing by Cristela FeltSteffens, Masie Bermingham P, RN Ability to maintain a clear airway will improve 03/09/2017 2315 - Progressing by Cristela FeltSteffens, Auburn Hester P, RN Health Behavior/Discharge Planning: Ability to manage health-related needs will improve 03/09/2017 2315 - Progressing by Cristela FeltSteffens, Jada Fass P, RN Clinical Measurements: Ability to maintain clinical measurements within normal limits will improve 03/09/2017 2315 - Progressing by Cristela FeltSteffens, Analeigha Nauman P, RN Will remain free from infection 03/09/2017 2315 - Progressing by Cristela FeltSteffens, Kenneth Lax P, RN Diagnostic test results will improve 03/09/2017 2315 - Progressing by Cristela FeltSteffens, Orenthal Debski P, RN Respiratory complications will improve 03/09/2017 2315 - Progressing by Cristela FeltSteffens, Menucha Dicesare P, RN Cardiovascular complication will be avoided 03/09/2017 2315 - Progressing by Cristela FeltSteffens, [place] P, RN

## 2017-03-09 NOTE — Progress Notes (Signed)
Pt. was able to obtain NIF of -40 cmh20/1.1L FVC both with good effort.

## 2017-03-09 NOTE — Progress Notes (Signed)
NIF: -30cm H2O, FVC 1.1L  Good effort, congested cough.

## 2017-03-09 NOTE — Progress Notes (Signed)
PROGRESS NOTE    Paula Bird  YQI:[id number] DOB: 19-Jun-1925 DOA: 03/04/2017 PCP: Angela Coxasanayaka, Gayani Y, MD   Brief Narrative:  History of myasthenia gravis, paroxysmal atrial fibrillation, COPD presented to the ED with complaints of shortness of breath times 1 week with a productive cough.  Patient noted to be on antibiotics however still with shortness of breath and presented to the ED.  Chest x-ray showed possible pneumonia.  Patient noted to be wheezing bilaterally on examination.  Patient started on antibiotics of Zyvox and cefepime for pneumonia.  Patient also on treatments.   Assessment & Plan:   Principal Problem:   HCAP (healthcare-associated pneumonia) Active Problems:   Obstructive chronic bronchitis with acute exacerbation (HCC)   Infection due to parainfluenza virus 4   Hypertension   Depression   Paroxysmal atrial fibrillation (HCC)   Essential hypertension   Myasthenia gravis (HCC)   Hyperthyroidism  #1 healthcare associated pneumonia secondary to parainfluenza virus #4 Patient presented with worsening shortness of breath, history of myasthenia gravis chest x-ray worrisome for pneumonia and patient noted to have a productive cough ongoing times 1 week, rhonchorous breath sounds.  Patient improving slowly on a daily basis however not at baseline yet. Influenza PCR negative.  Respiratory viral panel consistent and positive for parainfluenza type IV.  Patient with improvement with rhonchorous and coarse breath sounds on examination. Continue Mucinex 1200 mg twice daily, Pulmicort, Brovana, Claritin, Flonase, chest PT.  MRSA PCR was negative and as such IV vancomycin has been discontinued.  IV cefepime have been switched to oral Augmentin.  We will give a dose of Lasix 20 mg IV x1.  Follow.    2.  Myasthenia gravis Continue CellCept. NIF and vital capacity being followed per RT.  3.  Hyperthyroidism Continue Tapazole.  4.  Hypertension Continue Cardizem,  lisinopril.  5.  Mild COPD exacerbation Likely secondary to problem #1.  Continue scheduled Xopenex nebs, Atrovent scheduled nebs, Pulmicort to 0.5 mg twice daily, Brovana, Claritin, Flonase, chest PT.Continue oral prednisone taper.  Continue Augmentin. Follow.  6.  Atrial fibrillation Currently rate controlled on amiodarone and Cardizem.  Not an Anticoagulation candidate secondary to falls.  Aspirin.     DVT prophylaxis: Lovenox Code Status: DNR Family Communication: Updated patient.  No family at bedside. Disposition Plan: Likely home with home health services once clinically improved.   Consultants:   None  Procedures:   Chest x-ray 03/04/2017    Antimicrobials:   IV cefepime 03/04/2017>>>>> 03/08/2017  IV vancomycin 03/04/2017>>>> 03/05/2017  IV Zyvox x1 dose 03/05/2017  Augmentin 03/08/2017   Subjective: Patient sleeping in bed however easily arousable.  Patient states feeling more congested today than she did yesterday.  No chest pain.    Objective: Vitals:   03/08/17 2112 03/08/17 2127 03/09/17 0534 03/09/17 0900  BP:   (!) 136/56   Pulse:   61   Resp:   18   Temp:   97.8 F (36.6 C)   TempSrc:   Oral   SpO2: 93% 91% 95% 92%  Weight:   67.7 kg (149 lb 4 oz)   Height:        Intake/Output Summary (Last 24 hours) at 03/09/2017 1156 Last data filed at 03/09/2017 0900 Gross per 24 hour  Intake 560 ml  Output 1250 ml  Net -690 ml   Filed Weights   03/07/17 0538 03/08/17 0537 03/09/17 0534  Weight: 66.7 kg (147 lb) 67 kg (147 lb 11.3 oz) 67.7 kg (149 lb 4 oz)  Examination:  General exam: Sleeping. Respiratory system: Some rhonchorous diffuse breath sounds.  No crackles.  Respiratory effort normal. Cardiovascular system: Regular rate and rhythm no murmurs rubs or gallops.  No lower extremity edema. Gastrointestinal system: Abdomen is soft, nondistended, nontender, positive bowel sounds, no organomegaly, no rebound, no guarding.  Central  nervous system: Alert and oriented. No focal neurological deficits. Extremities: Symmetric 5 x 5 power. Skin: No rashes, lesions or ulcers Psychiatry: Judgement and insight appear normal. Mood & affect appropriate.     Data Reviewed: I have personally reviewed following labs and imaging studies  CBC: Recent Labs  Lab 03/04/17 2247  03/05/17 0459 03/06/17 0415 03/07/17 0525 03/08/17 0517 03/09/17 0525  WBC 8.4   < > 7.0 6.8 7.6 7.3 6.7  NEUTROABS 6.2  --   --  6.2 6.7 6.2 5.6  HGB 12.7   < > 11.6* 11.4* 11.4* 11.2* 11.7*  HCT 39.6   < > 36.6 35.5* 35.8* 35.6* 36.6  MCV 90.8   < > 90.8 90.6 90.9 91.3 91.3  PLT 157   < > 170 202 167 158 186   < > = values in this interval not displayed.   Basic Metabolic Panel: Recent Labs  Lab 03/05/17 0459 03/06/17 0415 03/07/17 0525 03/08/17 0517 03/09/17 0525  NA 137 134* 135 135 134*  K 3.8 3.9 4.1 4.5 4.3  CL 106 103 103 102 101  CO2 23 23 25 27 26   GLUCOSE 120* 147* 140* 133* 133*  BUN 30* 26* 30* 28* 34*  CREATININE 0.74 0.69 0.67 0.82 0.66  CALCIUM 8.6* 8.5* 8.9 8.9 9.1  MG  --  2.2  --   --   --    GFR: Estimated Creatinine Clearance: 40.9 mL/min (by C-G formula based on SCr of 0.66 mg/dL). Liver Function Tests: Recent Labs  Lab 03/06/17 0415  AST 27  ALT 43  ALKPHOS 40  BILITOT 0.5  PROT 5.9*  ALBUMIN 3.6   No results for input(s): LIPASE, AMYLASE in the last 168 hours. No results for input(s): AMMONIA in the last 168 hours. Coagulation Profile: No results for input(s): INR, PROTIME in the last 168 hours. Cardiac Enzymes: No results for input(s): CKTOTAL, CKMB, CKMBINDEX, TROPONINI in the last 168 hours. BNP (last 3 results) No results for input(s): PROBNP in the last 8760 hours. HbA1C: No results for input(s): HGBA1C in the last 72 hours. CBG: Recent Labs  Lab 03/08/17 1159 03/08/17 1639  GLUCAP 123* 163*   Lipid Profile: No results for input(s): CHOL, HDL, LDLCALC, TRIG, CHOLHDL, LDLDIRECT in the  last 72 hours. Thyroid Function Tests: No results for input(s): TSH, T4TOTAL, FREET4, T3FREE, THYROIDAB in the last 72 hours. Anemia Panel: No results for input(s): VITAMINB12, FOLATE, FERRITIN, TIBC, IRON, RETICCTPCT in the last 72 hours. Sepsis Labs: Recent Labs  Lab 03/04/17 2259  LATICACIDVEN 0.56    Recent Results (from the past 240 hour(s))  Blood culture (routine x 2)     Status: None (Preliminary result)   Collection Time: 03/04/17 10:47 PM  Result Value Ref Range Status   Specimen Description BLOOD RIGHT HAND  Final   Special Requests   Final    BOTTLES DRAWN AEROBIC AND ANAEROBIC Blood Culture adequate volume   Culture   Final    NO GROWTH 3 DAYS Performed at [facility], [address], [place], [place] [address]    Report Status PENDING  Incomplete  Blood culture (routine x 2)  Status: None (Preliminary result)   Collection Time: 03/04/17 10:47 PM  Result Value Ref Range Status   Specimen Description BLOOD LEFT ANTECUBITAL  Final   Special Requests   Final    BOTTLES DRAWN AEROBIC AND ANAEROBIC Blood Culture adequate volume   Culture   Final    NO GROWTH 3 DAYS Performed at [facility], [name], [place], [place] [address]    Report Status PENDING  Incomplete  Respiratory Panel by PCR     Status: Abnormal   Collection Time: 03/05/17  3:26 PM  Result Value Ref Range Status   Adenovirus NOT DETECTED NOT DETECTED Final   Coronavirus 229E NOT DETECTED NOT DETECTED Final   Coronavirus HKU1 NOT DETECTED NOT DETECTED Final   Coronavirus NL63 NOT DETECTED NOT DETECTED Final   Coronavirus OC43 NOT DETECTED NOT DETECTED Final   Metapneumovirus NOT DETECTED NOT DETECTED Final   Rhinovirus / Enterovirus NOT DETECTED NOT DETECTED Final   Influenza A NOT DETECTED NOT DETECTED Final   Influenza B NOT DETECTED NOT DETECTED Final   Parainfluenza Virus 1 NOT DETECTED NOT DETECTED Final   Parainfluenza Virus 2 NOT DETECTED NOT DETECTED Final    Parainfluenza Virus 3 NOT DETECTED NOT DETECTED Final   Parainfluenza Virus 4 DETECTED (A) NOT DETECTED Final   Respiratory Syncytial Virus NOT DETECTED NOT DETECTED Final   Bordetella pertussis NOT DETECTED NOT DETECTED Final   Chlamydophila pneumoniae NOT DETECTED NOT DETECTED Final   Mycoplasma pneumoniae NOT DETECTED NOT DETECTED Final    Comment: Performed at [facility], [address] [place], [place] [address]  MRSA PCR Screening     Status: None   Collection Time: 03/05/17  3:26 PM  Result Value Ref Range Status   MRSA by PCR NEGATIVE NEGATIVE Final    Comment:        The GeneXpert MRSA Assay (FDA approved for NASAL specimens only), is one component of a comprehensive MRSA colonization surveillance program. It is not intended to diagnose MRSA infection nor to guide or monitor treatment for MRSA infections.          Radiology Studies: No results found.      Scheduled Meds: . amiodarone  200 mg Oral Daily  . amitriptyline  75 mg Oral QHS  . amoxicillin-clavulanate  1 tablet Oral Q12H  . arformoterol  15 mcg Nebulization BID  . aspirin EC  81 mg Oral Daily  . budesonide (PULMICORT) nebulizer solution  0.5 mg Nebulization BID  . cholecalciferol  2,000 Units Oral Daily  . diltiazem  300 mg Oral Daily  . enoxaparin (LOVENOX) injection  40 mg Subcutaneous Q24H  . fluticasone  2 spray Each Nare Daily  . guaiFENesin  1,200 mg Oral BID  . hydrochlorothiazide  25 mg Oral Q M,W,F  . HYDROcodone-acetaminophen  1 tablet Oral TID  . ipratropium  0.5 mg Nebulization TID  . levalbuterol  1.25 mg Nebulization TID  . lisinopril  40 mg Oral Daily  . loratadine  10 mg Oral Daily  . lubiprostone  8 mcg Oral Q breakfast  . mouth rinse  15 mL Mouth Rinse BID  . methimazole  5 mg Oral Daily  . mycophenolate  500 mg Oral BID  . pantoprazole  40 mg Oral Daily  . PARoxetine  40 mg Oral Daily  . polyethylene glycol  17 g Oral BID  . polyvinyl alcohol  1 drop Both Eyes  BID  . predniSONE  20 mg  Oral QAC breakfast  . QUEtiapine  12.5 mg Oral QHS  . saccharomyces boulardii  250 mg Oral BID  . senna-docusate  1 tablet Oral BID  . traZODone  75 mg Oral QHS   Continuous Infusions:    LOS: 4 days    Time spent: 35 mins    Ramiro Harvestaniel Christabelle Hanzlik, MD [facility] Pager [phone]  If 7PM-7AM, please contact night-coverage [email or web address] Password [email or web address] 03/09/2017, 11:56 AM

## 2017-03-10 LAB — CULTURE, BLOOD (ROUTINE X 2)
CULTURE: NO GROWTH
CULTURE: NO GROWTH
Special Requests: ADEQUATE
Special Requests: ADEQUATE

## 2017-03-10 LAB — BASIC METABOLIC PANEL
Anion gap: 5 (ref 5–15)
BUN: 36 mg/dL — ABNORMAL HIGH (ref 6–20)
CALCIUM: 8.7 mg/dL — AB (ref 8.9–10.3)
CHLORIDE: 102 mmol/L (ref 101–111)
CO2: 29 mmol/L (ref 22–32)
CREATININE: 0.72 mg/dL (ref 0.44–1.00)
Glucose, Bld: 139 mg/dL — ABNORMAL HIGH (ref 65–99)
Potassium: 4.4 mmol/L (ref 3.5–5.1)
SODIUM: 136 mmol/L (ref 135–145)

## 2017-03-10 MED ORDER — LORAZEPAM 0.5 MG PO TABS
0.5000 mg | ORAL_TABLET | Freq: Every day | ORAL | Status: DC | PRN
  Administered 2017-03-11: 0.5 mg via ORAL
  Filled 2017-03-10: qty 1

## 2017-03-10 MED ORDER — LORAZEPAM 0.5 MG PO TABS: 0.5000 mg | ORAL_TABLET | Freq: Every day | ORAL | Status: DC

## 2017-03-10 MED ORDER — FUROSEMIDE 10 MG/ML IJ SOLN
20.0000 mg | Freq: Once | INTRAMUSCULAR | Status: AC
  Administered 2017-03-10: 20 mg via INTRAVENOUS
  Filled 2017-03-10: qty 2

## 2017-03-10 NOTE — Progress Notes (Signed)
PROGRESS NOTE    Paula Bird  UJW:[id number] DOB: 31-Jan-1926 DOA: 03/04/2017 PCP: Angela Coxasanayaka, Gayani Y, MD   Brief Narrative:  History of myasthenia gravis, paroxysmal atrial fibrillation, COPD presented to the ED with complaints of shortness of breath times 1 week with a productive cough.  Patient noted to be on antibiotics however still with shortness of breath and presented to the ED.  Chest x-ray showed possible pneumonia.  Patient noted to be wheezing bilaterally on examination.  Patient started on antibiotics of Zyvox and cefepime for pneumonia.  Patient also on treatments.   Assessment & Plan:   Principal Problem:   HCAP (healthcare-associated pneumonia) Active Problems:   Obstructive chronic bronchitis with acute exacerbation (HCC)   Infection due to parainfluenza virus 4   Hypertension   Depression   Paroxysmal atrial fibrillation (HCC)   Essential hypertension   Myasthenia gravis (HCC)   Hyperthyroidism  #1 healthcare associated pneumonia secondary to parainfluenza virus #4 Patient presented with worsening shortness of breath, history of myasthenia gravis chest x-ray worrisome for pneumonia and patient noted to have a productive cough ongoing times 1 week, rhonchorous breath sounds.  Patient improving slowly on a daily basis however not at baseline yet. Influenza PCR negative.  Respiratory viral panel consistent and positive for parainfluenza type IV.  Patient with improvement however still rhonchorous and coarse breath sounds on examination. Continue Mucinex 1200 mg twice daily, Pulmicort, Brovana, Claritin, Flonase.  MRSA PCR was negative and as such IV vancomycin has been discontinued. Patient refusing chest PT per RT.  IV cefepime have been switched to oral Augmentin.  Given a dose of IV Lasix yesterday with good urine output of 1.4 L.  We will give Lasix 20 mg IV every 12 hours x2 doses and follow.   2.  Myasthenia gravis Continue CellCept. NIF and vital capacity  being followed per RT daily.  She will with good effort per RT.  Continue flutter valve.  3.  Hyperthyroidism Continue Tapazole.  4.  Hypertension Continue Cardizem, lisinopril.  5.  Mild COPD exacerbation Likely secondary to problem #1.  Continue scheduled Xopenex nebs, Atrovent scheduled nebs, Pulmicort to 0.5 mg twice daily, Brovana, Claritin, Flonase, chest PT. Continue oral prednisone taper.  Continue Augmentin. Follow.  6.  Atrial fibrillation Currently rate controlled on amiodarone and Cardizem.  Not an Anticoagulation candidate secondary to falls.  Aspirin.     DVT prophylaxis: Lovenox Code Status: DNR Family Communication: Updated patient.  No family at bedside. Disposition Plan: Likely home with home health services once clinically improved.   Consultants:   None  Procedures:   Chest x-ray 03/04/2017    Antimicrobials:   IV cefepime 03/04/2017>>>>> 03/08/2017  IV vancomycin 03/04/2017>>>> 03/05/2017  IV Zyvox x1 dose 03/05/2017  Augmentin 03/08/2017   Subjective: Patient sitting up in chair.  Patient states she feels a little bit better than she did yesterday.  Still feels very congested.  Denies any chest pain.  Asking for her night meds to be given early on in the day as she is unable to get to sleep until after 1 AM.   Objective: Vitals:   03/09/17 2123 03/10/17 0500 03/10/17 0532 03/10/17 0721  BP: 111/71  132/60   Pulse: 65  (!) 56   Resp: 14  15   Temp: 98.3 F (36.8 C)  98.1 F (36.7 C)   TempSrc: Oral  Oral   SpO2: 95%  93% 99%  Weight:  66.2 kg (145 lb 15.1 oz) 66.5 kg (146 lb  9.7 oz)   Height:        Intake/Output Summary (Last 24 hours) at 03/10/2017 1250 Last data filed at 03/10/2017 0845 Gross per 24 hour  Intake 600 ml  Output 1400 ml  Net -800 ml   Filed Weights   03/09/17 0534 03/10/17 0500 03/10/17 0532  Weight: 67.7 kg (149 lb 4 oz) 66.2 kg (145 lb 15.1 oz) 66.5 kg (146 lb 9.7 oz)    Examination:  General exam:  Sitting up in chair. Respiratory system: Coarse rhonchorous diffuse breath sounds.  No crackles.  Respiratory effort normal. Cardiovascular system: Regular rate and rhythm no murmurs rubs or gallops.  No lower extremity edema. Gastrointestinal system: Abdomen is soft, nondistended, nontender, positive bowel sounds, no organomegaly, no rebound, no guarding.  Central nervous system: Alert and oriented. No focal neurological deficits. Extremities: Symmetric 5 x 5 power. Skin: No rashes, lesions or ulcers Psychiatry: Judgement and insight appear normal. Mood & affect appropriate.     Data Reviewed: I have personally reviewed following labs and imaging studies  CBC: Recent Labs  Lab 03/04/17 2247  03/05/17 0459 03/06/17 0415 03/07/17 0525 03/08/17 0517 03/09/17 0525  WBC 8.4   < > 7.0 6.8 7.6 7.3 6.7  NEUTROABS 6.2  --   --  6.2 6.7 6.2 5.6  HGB 12.7   < > 11.6* 11.4* 11.4* 11.2* 11.7*  HCT 39.6   < > 36.6 35.5* 35.8* 35.6* 36.6  MCV 90.8   < > 90.8 90.6 90.9 91.3 91.3  PLT 157   < > 170 202 167 158 186   < > = values in this interval not displayed.   Basic Metabolic Panel: Recent Labs  Lab 03/06/17 0415 03/07/17 0525 03/08/17 0517 03/09/17 0525 03/10/17 0521  NA 134* 135 135 134* 136  K 3.9 4.1 4.5 4.3 4.4  CL 103 103 102 101 102  CO2 23 25 27 26 29   GLUCOSE 147* 140* 133* 133* 139*  BUN 26* 30* 28* 34* 36*  CREATININE 0.69 0.67 0.82 0.66 0.72  CALCIUM 8.5* 8.9 8.9 9.1 8.7*  MG 2.2  --   --   --   --    GFR: Estimated Creatinine Clearance: 40.6 mL/min (by C-G formula based on SCr of 0.72 mg/dL). Liver Function Tests: Recent Labs  Lab 03/06/17 0415  AST 27  ALT 43  ALKPHOS 40  BILITOT 0.5  PROT 5.9*  ALBUMIN 3.6   No results for input(s): LIPASE, AMYLASE in the last 168 hours. No results for input(s): AMMONIA in the last 168 hours. Coagulation Profile: No results for input(s): INR, PROTIME in the last 168 hours. Cardiac Enzymes: No results for input(s):  CKTOTAL, CKMB, CKMBINDEX, TROPONINI in the last 168 hours. BNP (last 3 results) No results for input(s): PROBNP in the last 8760 hours. HbA1C: No results for input(s): HGBA1C in the last 72 hours. CBG: Recent Labs  Lab 03/08/17 1159 03/08/17 1639  GLUCAP 123* 163*   Lipid Profile: No results for input(s): CHOL, HDL, LDLCALC, TRIG, CHOLHDL, LDLDIRECT in the last 72 hours. Thyroid Function Tests: No results for input(s): TSH, T4TOTAL, FREET4, T3FREE, THYROIDAB in the last 72 hours. Anemia Panel: No results for input(s): VITAMINB12, FOLATE, FERRITIN, TIBC, IRON, RETICCTPCT in the last 72 hours. Sepsis Labs: Recent Labs  Lab 03/04/17 2259  LATICACIDVEN 0.56    Recent Results (from the past 240 hour(s))  Blood culture (routine x 2)     Status: None   Collection Time: 03/04/17 10:47 PM  Result Value Ref Range Status   Specimen Description BLOOD RIGHT HAND  Final   Special Requests   Final    BOTTLES DRAWN AEROBIC AND ANAEROBIC Blood Culture adequate volume   Culture   Final    NO GROWTH 5 DAYS Performed at [facility], [address], [place], [place] [address]    Report Status 03/10/2017 FINAL  Final  Blood culture (routine x 2)     Status: None   Collection Time: 03/04/17 10:47 PM  Result Value Ref Range Status   Specimen Description BLOOD LEFT ANTECUBITAL  Final   Special Requests   Final    BOTTLES DRAWN AEROBIC AND ANAEROBIC Blood Culture adequate volume   Culture   Final    NO GROWTH 5 DAYS Performed at [facility], [address], [place], [place] [address]    Report Status 03/10/2017 FINAL  Final  Respiratory Panel by PCR     Status: Abnormal   Collection Time: 03/05/17  3:26 PM  Result Value Ref Range Status   Adenovirus NOT DETECTED NOT DETECTED Final   Coronavirus 229E NOT DETECTED NOT DETECTED Final   Coronavirus HKU1 NOT DETECTED NOT DETECTED Final   Coronavirus NL63 NOT DETECTED NOT DETECTED Final   Coronavirus OC43 NOT DETECTED NOT  DETECTED Final   Metapneumovirus NOT DETECTED NOT DETECTED Final   Rhinovirus / Enterovirus NOT DETECTED NOT DETECTED Final   Influenza A NOT DETECTED NOT DETECTED Final   Influenza B NOT DETECTED NOT DETECTED Final   Parainfluenza Virus 1 NOT DETECTED NOT DETECTED Final   Parainfluenza Virus 2 NOT DETECTED NOT DETECTED Final   Parainfluenza Virus 3 NOT DETECTED NOT DETECTED Final   Parainfluenza Virus 4 DETECTED (A) NOT DETECTED Final   Respiratory Syncytial Virus NOT DETECTED NOT DETECTED Final   Bordetella pertussis NOT DETECTED NOT DETECTED Final   Chlamydophila pneumoniae NOT DETECTED NOT DETECTED Final   Mycoplasma pneumoniae NOT DETECTED NOT DETECTED Final    Comment: Performed at [facility], [address] [place], [place] [address]  MRSA PCR Screening     Status: None   Collection Time: 03/05/17  3:26 PM  Result Value Ref Range Status   MRSA by PCR NEGATIVE NEGATIVE Final    Comment:        The GeneXpert MRSA Assay (FDA approved for NASAL specimens only), is one component of a comprehensive MRSA colonization surveillance program. It is not intended to diagnose MRSA infection nor to guide or monitor treatment for MRSA infections.          Radiology Studies: No results found.      Scheduled Meds: . amiodarone  200 mg Oral Daily  . amitriptyline  75 mg Oral QHS  . amoxicillin-clavulanate  1 tablet Oral Q12H  . arformoterol  15 mcg Nebulization BID  . aspirin EC  81 mg Oral Daily  . budesonide (PULMICORT) nebulizer solution  0.5 mg Nebulization BID  . cholecalciferol  2,000 Units Oral Daily  . diltiazem  300 mg Oral Daily  . enoxaparin (LOVENOX) injection  40 mg Subcutaneous Q24H  . fluticasone  2 spray Each Nare Daily  . guaiFENesin  1,200 mg Oral BID  . hydrochlorothiazide  25 mg Oral Q M,W,F  . HYDROcodone-acetaminophen  1 tablet Oral TID  . ipratropium  0.5 mg Nebulization TID  . levalbuterol  1.25 mg Nebulization TID  . lisinopril  40 mg  Oral Daily  . loratadine  10 mg Oral  Daily  . LORazepam  0.5 mg Oral QHS  . lubiprostone  8 mcg Oral Q breakfast  . mouth rinse  15 mL Mouth Rinse BID  . methimazole  5 mg Oral Daily  . mycophenolate  500 mg Oral BID  . pantoprazole  40 mg Oral Daily  . PARoxetine  40 mg Oral Daily  . polyethylene glycol  17 g Oral BID  . polyvinyl alcohol  1 drop Both Eyes BID  . predniSONE  20 mg Oral QAC breakfast  . QUEtiapine  12.5 mg Oral QHS  . saccharomyces boulardii  250 mg Oral BID  . senna-docusate  1 tablet Oral BID  . spironolactone  25 mg Oral Daily  . traZODone  75 mg Oral QHS   Continuous Infusions:    LOS: 5 days    Time spent: 35 mins    Ramiro Harvest, MD [facility] Pager [phone]  If 7PM-7AM, please contact night-coverage [email or web address] Password [email or web address] 03/10/2017, 12:50 PM

## 2017-03-10 NOTE — Progress Notes (Signed)
Pt. able to obtain NIF of -38 cmh20/FVC of .9 L both with good effort.

## 2017-03-10 NOTE — Progress Notes (Signed)
NIF and VC performed. NIF -40 VC 1L  Good patient effort.   Patient still refusing chest vest. Patient performed flutter valve with good effort.

## 2017-03-10 NOTE — Plan of Care (Signed)
  Progressing Clinical Measurements: Ability to maintain a body temperature in the normal range will improve 03/10/2017 2211 - Progressing by Cristela FeltSteffens, Lovelle Lema P, RN Respiratory: Ability to maintain adequate ventilation will improve 03/10/2017 2211 - Progressing by Cristela FeltSteffens, Travelle Mcclimans P, RN Ability to maintain a clear airway will improve 03/10/2017 2211 - Progressing by Cristela FeltSteffens, Horace Lukas P, RN Health Behavior/Discharge Planning: Ability to manage health-related needs will improve 03/10/2017 2211 - Progressing by Cristela FeltSteffens, Annisten Manchester P, RN Clinical Measurements: Ability to maintain clinical measurements within normal limits will improve 03/10/2017 2211 - Progressing by Cristela FeltSteffens, Jayanna Kroeger P, RN Will remain free from infection 03/10/2017 2211 - Progressing by Cristela FeltSteffens, Baillie Mohammad P, RN Diagnostic test results will improve 03/10/2017 2211 - Progressing by Cristela FeltSteffens, Jimmey Hengel P, RN Respiratory complications will improve 03/10/2017 2211 - Progressing by Cristela FeltSteffens, Jaree Trinka P, RN Cardiovascular complication will be avoided 03/10/2017 2211 - Progressing by Cristela FeltSteffens, Nazar Kuan P, RN

## 2017-03-10 NOTE — NC FL2 (Signed)
[place] MEDICAID FL2 LEVEL OF CARE SCREENING TOOL     IDENTIFICATION  Patient Name: Paula Bird Birthdate: [birth year] Sex: female Admission Date (Current Location): 03/04/2017  [facility] [place] [place]   Facility and Address:  [facility],  [address], [place] [id number]      Provider Number: [id number]  Attending Physician Name and Address:  Rodolph Bonghompson, Daniel V, MD  Relative Name and Phone Number:  Wynne DustGuy Mundis, son, [phone]    Current Level of Care: Hospital Recommended Level of Care: Assisted Living Facility Prior Approval Number:    Date Approved/Denied:   PASRR Number:    Discharge Plan: Domiciliary (Rest home)(ALF)    Current Diagnoses: Patient Active Problem List   Diagnosis Date Noted  . Infection due to parainfluenza virus 4 03/06/2017  . Gait abnormality 06/07/2016  . Hyperthyroidism 04/24/2016  . Myasthenia gravis (HCC) 02/07/2016  . Ptosis of eyelid, right 01/31/2016  . Pain, eye, right 01/31/2016  . Falling 01/31/2016  . Vertigo 10/08/2015  . Colon polyp 08/30/2015  . Arthritis, degenerative 08/30/2015  . Acid indigestion 08/30/2015  . Psoriasis 08/30/2015  . Avitaminosis D 08/30/2015  . HCAP (healthcare-associated pneumonia) 08/08/2015  . Respiratory distress 08/08/2015  . Essential hypertension 08/08/2015  . COPD exacerbation (HCC) 08/08/2015  . Dyspnea 08/04/2015  . Hypertension 08/04/2015  . Depression 08/04/2015  . Insomnia 08/04/2015  . Obstructive chronic bronchitis with acute exacerbation (HCC) 08/04/2015  . Thrombocytopenia (HCC) 08/04/2015  . Paroxysmal atrial fibrillation (HCC) 08/04/2015  . Acute exacerbation of chronic obstructive bronchitis (HCC) 08/04/2015  . Infection of urinary tract 07/01/2014  . Bradycardia 11/24/2013  . Fall in home 11/22/2013  . Pulmonary hypertension (HCC) 11/19/2013  . Excessive falling 09/07/2012  . Encounter for general adult medical examination without  abnormal findings 03/24/2012  . Atrial paroxysmal tachycardia (HCC) 10/05/2011  . Family history of colon cancer 05/15/2011  . Mass of pelvis 05/15/2011    Orientation RESPIRATION BLADDER Height & Weight     Self, Time, Situation, Place  O2(nasal cannula 2L) Incontinent, External catheter Weight: 146 lb 9.7 oz (66.5 kg) Height:  5' (152.4 cm)  BEHAVIORAL SYMPTOMS/MOOD NEUROLOGICAL BOWEL NUTRITION STATUS      Continent Diet(heart diet/please see DC summary)  AMBULATORY STATUS COMMUNICATION OF NEEDS Skin   Limited Assist Verbally Normal                       Personal Care Assistance Level of Assistance  Bathing, Feeding, Dressing Bathing Assistance: Limited assistance Feeding assistance: Independent Dressing Assistance: Limited assistance     Functional Limitations Info  Sight, Hearing, Speech Sight Info: Adequate Hearing Info: Adequate Speech Info: Adequate    SPECIAL CARE FACTORS FREQUENCY  PT (By licensed PT)     PT Frequency: 5x/week              Contractures Contractures Info: Not present    Additional Factors Info  Code Status, Allergies, Psychotropic Code Status Info: DNR Allergies Info: No Known Allergies Psychotropic Info: ativan, paxil, seroquel, trazodone         Current Medications (03/10/2017):  This is the current hospital active medication list Current Facility-Administered Medications  Medication Dose Route Frequency Provider Last Rate Last Dose  . acetaminophen (TYLENOL) tablet 650 mg  650 mg Oral Q6H PRN Eduard ClosKakrakandy, Arshad N, MD       Or  . acetaminophen (TYLENOL) suppository 650 mg  650 mg Rectal Q6H PRN Eduard ClosKakrakandy, Arshad N, MD      .  amiodarone (PACERONE) tablet 200 mg  200 mg Oral Daily Eduard Clos, MD   200 mg at 03/09/17 1037  . amitriptyline (ELAVIL) tablet 75 mg  75 mg Oral QHS Eduard Clos, MD   75 mg at 03/09/17 2121  . amoxicillin-clavulanate (AUGMENTIN) 875-125 MG per tablet 1 tablet  1 tablet Oral Q12H  Rodolph Bong, MD   1 tablet at 03/09/17 2122  . arformoterol (BROVANA) nebulizer solution 15 mcg  15 mcg Nebulization BID Rodolph Bong, MD   15 mcg at 03/10/17 1610  . aspirin EC tablet 81 mg  81 mg Oral Daily Eduard Clos, MD   81 mg at 03/09/17 1036  . bisacodyl (DULCOLAX) suppository 10 mg  10 mg Rectal Daily PRN Eduard Clos, MD      . budesonide (PULMICORT) nebulizer solution 0.5 mg  0.5 mg Nebulization BID Rodolph Bong, MD   0.5 mg at 03/10/17 9604  . cholecalciferol (VITAMIN D) tablet 2,000 Units  2,000 Units Oral Daily Eduard Clos, MD   2,000 Units at 03/09/17 1037  . diltiazem (CARDIZEM CD) 24 hr capsule 300 mg  300 mg Oral Daily Eduard Clos, MD   300 mg at 03/09/17 1036  . enoxaparin (LOVENOX) injection 40 mg  40 mg Subcutaneous Q24H Hanley Ben, Kshitiz, MD   40 mg at 03/09/17 1036  . fluticasone (FLONASE) 50 MCG/ACT nasal spray 2 spray  2 spray Each Nare Daily Rodolph Bong, MD   2 spray at 03/09/17 1043  . furosemide (LASIX) injection 20 mg  20 mg Intravenous Once Rodolph Bong, MD      . guaiFENesin [facility]) 12 hr tablet 1,200 mg  1,200 mg Oral BID Rodolph Bong, MD   1,200 mg at 03/09/17 2122  . guaiFENesin-dextromethorphan (ROBITUSSIN DM) 100-10 MG/5ML syrup 5 mL  5 mL Oral Q4H PRN Rodolph Bong, MD   5 mL at 03/09/17 0055  . hydrochlorothiazide (HYDRODIURIL) tablet 25 mg  25 mg Oral Q M,W,F Eduard Clos, MD   25 mg at 03/08/17 1119  . HYDROcodone-acetaminophen (NORCO/VICODIN) 5-325 MG per tablet 1 tablet  1 tablet Oral TID Eduard Clos, MD   1 tablet at 03/09/17 2123  . ipratropium (ATROVENT) nebulizer solution 0.5 mg  0.5 mg Nebulization Q2H PRN Rodolph Bong, MD      . ipratropium (ATROVENT) nebulizer solution 0.5 mg  0.5 mg Nebulization TID Rodolph Bong, MD   0.5 mg at 03/10/17 5409  . levalbuterol (XOPENEX) nebulizer solution 0.63 mg  0.63 mg Nebulization Q6H PRN Eduard Clos, MD       . levalbuterol Pauline Aus) nebulizer solution 1.25 mg  1.25 mg Nebulization TID Rodolph Bong, MD   1.25 mg at 03/10/17 8119  . lisinopril (PRINIVIL,ZESTRIL) tablet 40 mg  40 mg Oral Daily Eduard Clos, MD   40 mg at 03/09/17 1037  . loratadine (CLARITIN) tablet 10 mg  10 mg Oral Daily Rodolph Bong, MD   10 mg at 03/09/17 1036  . LORazepam (ATIVAN) tablet 0.5 mg  0.5 mg Oral QHS Rodolph Bong, MD      . lubiprostone (AMITIZA) capsule 8 mcg  8 mcg Oral Q breakfast Rodolph Bong, MD   8 mcg at 03/10/17 [phone]  . MEDLINE mouth rinse  15 mL Mouth Rinse BID Hanley Ben, Kshitiz, MD   15 mL at 03/08/17 1411  . metaxalone (SKELAXIN) tablet 800 mg  800 mg Oral BID  PRN Eduard ClosKakrakandy, Arshad N, MD      . methimazole (TAPAZOLE) tablet 5 mg  5 mg Oral Daily Eduard ClosKakrakandy, Arshad N, MD   5 mg at 03/09/17 1037  . mycophenolate (CELLCEPT) capsule 500 mg  500 mg Oral BID Eduard ClosKakrakandy, Arshad N, MD   500 mg at 03/09/17 2122  . ondansetron (ZOFRAN) tablet 4 mg  4 mg Oral Q6H PRN Eduard ClosKakrakandy, Arshad N, MD       Or  . ondansetron [facility](ZOFRAN) injection 4 mg  4 mg Intravenous Q6H PRN Eduard ClosKakrakandy, Arshad N, MD      . pantoprazole (PROTONIX) EC tablet 40 mg  40 mg Oral Daily Eduard ClosKakrakandy, Arshad N, MD   40 mg at 03/09/17 1037  . PARoxetine (PAXIL) tablet 40 mg  40 mg Oral Daily Eduard ClosKakrakandy, Arshad N, MD   40 mg at 03/09/17 1037  . polyethylene glycol (MIRALAX / GLYCOLAX) packet 17 g  17 g Oral BID Eduard ClosKakrakandy, Arshad N, MD   17 g at 03/09/17 2118  . polyvinyl alcohol (LIQUIFILM TEARS) 1.4 % ophthalmic solution 1 drop  1 drop Both Eyes BID Rodolph Bonghompson, Daniel V, MD   1 drop at 03/09/17 2127  . predniSONE (DELTASONE) tablet 20 mg  20 mg Oral QAC breakfast Rodolph Bonghompson, Daniel V, MD   20 mg at 03/10/17 0834  . promethazine (PHENERGAN) tablet 25 mg  25 mg Oral Q6H PRN Eduard ClosKakrakandy, Arshad N, MD      . QUEtiapine (SEROQUEL) tablet 12.5 mg  12.5 mg Oral QHS Eduard ClosKakrakandy, Arshad N, MD   12.5 mg at 03/09/17 2121  . saccharomyces  boulardii (FLORASTOR) capsule 250 mg  250 mg Oral BID Rodolph Bonghompson, Daniel V, MD   250 mg at 03/09/17 2122  . senna-docusate (Senokot-S) tablet 1 tablet  1 tablet Oral BID Eduard ClosKakrakandy, Arshad N, MD   1 tablet at 03/09/17 1037  . spironolactone (ALDACTONE) tablet 25 mg  25 mg Oral Daily Rodolph Bonghompson, Daniel V, MD   25 mg at 03/09/17 1745  . SUMAtriptan (IMITREX) tablet 50 mg  50 mg Oral Q2H PRN Rodolph Bonghompson, Daniel V, MD      . traMADol Janean Sark(ULTRAM) tablet 50 mg  50 mg Oral Q12H PRN Eduard ClosKakrakandy, Arshad N, MD   50 mg at 03/05/17 1756  . traZODone (DESYREL) tablet 75 mg  75 mg Oral QHS Eduard ClosKakrakandy, Arshad N, MD   75 mg at 03/09/17 2120  . zolpidem (AMBIEN) tablet 5 mg  5 mg Oral QHS PRN Rodolph Bonghompson, Daniel V, MD   5 mg at 03/09/17 2122     Discharge Medications: Please see discharge summary for a list of discharge medications.  Relevant Imaging Results:  Relevant Lab Results:   Additional Information SSN: [id number]  Abigail ButtsSusan Doninique Lwin, LCSW

## 2017-03-11 ENCOUNTER — Inpatient Hospital Stay (HOSPITAL_COMMUNITY): Payer: Medicare Other

## 2017-03-11 LAB — BASIC METABOLIC PANEL
ANION GAP: 7 (ref 5–15)
BUN: 37 mg/dL — ABNORMAL HIGH (ref 6–20)
CO2: 26 mmol/L (ref 22–32)
Calcium: 8.5 mg/dL — ABNORMAL LOW (ref 8.9–10.3)
Chloride: 100 mmol/L — ABNORMAL LOW (ref 101–111)
Creatinine, Ser: 0.62 mg/dL (ref 0.44–1.00)
GFR calc Af Amer: >60 mL/min (ref >60)
Glucose, Bld: 158 mg/dL — ABNORMAL HIGH (ref 65–99)
POTASSIUM: 4.2 mmol/L (ref 3.5–5.1)
SODIUM: 133 mmol/L — AB (ref 135–145)

## 2017-03-11 LAB — CBC
HCT: 37.8 % (ref 36.0–46.0)
Hemoglobin: 12.1 g/dL (ref 12.0–15.0)
MCH: 29.3 pg (ref 26.0–34.0)
MCHC: 32 g/dL (ref 30.0–36.0)
MCV: 91.5 fL (ref 78.0–100.0)
PLATELETS: 162 10*3/uL (ref 150–400)
RBC: 4.13 MIL/uL (ref 3.87–5.11)
RDW: 14.9 % (ref 11.5–15.5)
WBC: 8 10*3/uL (ref 4.0–10.5)

## 2017-03-11 MED ORDER — MIRTAZAPINE 15 MG PO TABS
15.0000 mg | ORAL_TABLET | Freq: Every day | ORAL | Status: DC
  Administered 2017-03-11 – 2017-03-12 (×2): 15 mg via ORAL
  Filled 2017-03-11 (×2): qty 1

## 2017-03-11 MED ORDER — SODIUM CHLORIDE 3 % IN NEBU
4.0000 mL | INHALATION_SOLUTION | Freq: Two times a day (BID) | RESPIRATORY_TRACT | Status: DC
  Administered 2017-03-11 – 2017-03-13 (×4): 4 mL via RESPIRATORY_TRACT
  Filled 2017-03-11 (×5): qty 4

## 2017-03-11 MED ORDER — DEXTROSE 5 % IV SOLN
1.0000 g | Freq: Two times a day (BID) | INTRAVENOUS | Status: DC
  Administered 2017-03-11 – 2017-03-12 (×2): 1 g via INTRAVENOUS
  Filled 2017-03-11 (×2): qty 1

## 2017-03-11 NOTE — Progress Notes (Signed)
PROGRESS NOTE    Paula Bird  ZOX:[id number] DOB: 08-30-1925 DOA: 03/04/2017 PCP: Angela Coxasanayaka, Gayani Y, MD   Brief Narrative:  History of myasthenia gravis, paroxysmal atrial fibrillation, COPD presented to the ED with complaints of shortness of breath times 1 week with a productive cough.  Patient noted to be on antibiotics however still with shortness of breath and presented to the ED.  Chest x-ray showed possible pneumonia.  Patient noted to be wheezing bilaterally on examination.  Patient started on antibiotics of Zyvox and cefepime for pneumonia.  Patient also on treatments.   Assessment & Plan:   Principal Problem:   HCAP (healthcare-associated pneumonia) Active Problems:   Obstructive chronic bronchitis with acute exacerbation (HCC)   Infection due to parainfluenza virus 4   Hypertension   Depression   Paroxysmal atrial fibrillation (HCC)   Essential hypertension   Myasthenia gravis (HCC)   Hyperthyroidism  #1 healthcare associated pneumonia secondary to parainfluenza virus #4 Patient presented with worsening shortness of breath, history of myasthenia gravis chest x-ray worrisome for pneumonia and patient noted to have a productive cough ongoing times 1 week, rhonchorous breath sounds.  Patient seemed to have initially be slowly improving however still significantly rhonchorous with diffuse coarse breath sounds. Influenza PCR negative.  Respiratory viral panel consistent and positive for parainfluenza type IV.  Continue Mucinex 1200 mg twice daily, Pulmicort, Brovana, Claritin, Flonase.  MRSA PCR was negative and as such IV vancomycin has been discontinued. Patient refusing chest PT per RT.  IV cefepime was switched to oral Augmentin.  Given a dose of IV Lasix yesterday with good urine output of 1.65 L.  She is still significantly congested and rhonchorous.  Repeat chest x-ray obtained with progressive retrocardiac opacification.  Will change Augmentin back to IV cefepime.   Patient to be started on 3% saline nebs.  Case discussed with pulmonary medicine who will assess patient for further evaluation and management on 03/12/2017.    2.  Myasthenia gravis Continue CellCept. NIF and vital capacity being followed per RT daily.  She will with good effort per RT.  Continue flutter valve.  3.  Hyperthyroidism Continue Tapazole.  4.  Hypertension Continue Cardizem, lisinopril.  5.  Mild COPD exacerbation Likely secondary to problem #1.  Continue scheduled Xopenex nebs, Atrovent scheduled nebs, Pulmicort to 0.5 mg twice daily, Brovana, Claritin, Flonase, chest PT. Prednisone to be tapered off today.   6.  Atrial fibrillation Continue amiodarone and Cardizem for rate control.  Not an Anticoagulation candidate secondary to falls.  Aspirin.     DVT prophylaxis: Lovenox Code Status: DNR Family Communication: Updated patient.  No family at bedside. Disposition Plan: Likely home with home health services once clinically improved and after patient has been assessed by pulmonary.   Consultants:   Pulmonary pending 03/12/2017  Procedures:   Chest x-ray 03/04/2017, 03/11/2017    Antimicrobials:   IV cefepime 03/04/2017>>>>> 03/08/2017  IV vancomycin 03/04/2017>>>> 03/05/2017  IV Zyvox x1 dose 03/05/2017  Augmentin 03/08/2017>>>>> 03/11/2017  IV cefepime 03/11/2017    Subjective: Patient sitting up in chair.  Patient with no significant improvement with congestion/rhonchi.  Patient still significantly congested.  No chest pain.  Patient complained of not being able to fall asleep early.  Per RT patient has been refusing chest PT.  Objective: Vitals:   03/11/17 0446 03/11/17 0801 03/11/17 1401 03/11/17 1550  BP: (!) 114/51   (!) 115/55  Pulse: (!) 52   (!) 59  Resp: 18   18  Temp:  98.5 F (36.9 C)   98.3 F (36.8 C)  TempSrc: Oral   Oral  SpO2: 96% 94% 90% 95%  Weight: 65.8 kg (145 lb 1 oz)     Height:        Intake/Output Summary (Last 24  hours) at 03/11/2017 1703 Last data filed at 03/11/2017 1603 Gross per 24 hour  Intake 320 ml  Output 650 ml  Net -330 ml   Filed Weights   03/10/17 0500 03/10/17 0532 03/11/17 0446  Weight: 66.2 kg (145 lb 15.1 oz) 66.5 kg (146 lb 9.7 oz) 65.8 kg (145 lb 1 oz)    Examination:  General exam: Sitting up in chair. Respiratory system: Sent with significant coarse rhonchorous breath sounds with no significant change.  No wheezing. Respiratory effort normal. Cardiovascular system: Regular rate and rhythm no murmurs rubs or gallops.  No lower extremity edema. Gastrointestinal system: Abdomen is nondistended, nontender, soft, positive bowel sounds. Central nervous system: Alert and oriented. No focal neurological deficits. Extremities: Symmetric 5 x 5 power. Skin: No rashes, lesions or ulcers Psychiatry: Judgement and insight appear normal. Mood & affect appropriate.     Data Reviewed: I have personally reviewed following labs and imaging studies  CBC: Recent Labs  Lab 03/04/17 2247  03/06/17 0415 03/07/17 0525 03/08/17 0517 03/09/17 0525 03/11/17 0436  WBC 8.4   < > 6.8 7.6 7.3 6.7 8.0  NEUTROABS 6.2  --  6.2 6.7 6.2 5.6  --   HGB 12.7   < > 11.4* 11.4* 11.2* 11.7* 12.1  HCT 39.6   < > 35.5* 35.8* 35.6* 36.6 37.8  MCV 90.8   < > 90.6 90.9 91.3 91.3 91.5  PLT 157   < > 202 167 158 186 162   < > = values in this interval not displayed.   Basic Metabolic Panel: Recent Labs  Lab 03/06/17 0415 03/07/17 0525 03/08/17 0517 03/09/17 0525 03/10/17 0521 03/11/17 0436  NA 134* 135 135 134* 136 133*  K 3.9 4.1 4.5 4.3 4.4 4.2  CL 103 103 102 101 102 100*  CO2 23 25 27 26 29 26   GLUCOSE 147* 140* 133* 133* 139* 158*  BUN 26* 30* 28* 34* 36* 37*  CREATININE 0.69 0.67 0.82 0.66 0.72 0.62  CALCIUM 8.5* 8.9 8.9 9.1 8.7* 8.5*  MG 2.2  --   --   --   --   --    GFR: Estimated Creatinine Clearance: 40.3 mL/min (by C-G formula based on SCr of 0.62 mg/dL). Liver Function  Tests: Recent Labs  Lab 03/06/17 0415  AST 27  ALT 43  ALKPHOS 40  BILITOT 0.5  PROT 5.9*  ALBUMIN 3.6   No results for input(s): LIPASE, AMYLASE in the last 168 hours. No results for input(s): AMMONIA in the last 168 hours. Coagulation Profile: No results for input(s): INR, PROTIME in the last 168 hours. Cardiac Enzymes: No results for input(s): CKTOTAL, CKMB, CKMBINDEX, TROPONINI in the last 168 hours. BNP (last 3 results) No results for input(s): PROBNP in the last 8760 hours. HbA1C: No results for input(s): HGBA1C in the last 72 hours. CBG: Recent Labs  Lab 03/08/17 1159 03/08/17 1639  GLUCAP 123* 163*   Lipid Profile: No results for input(s): CHOL, HDL, LDLCALC, TRIG, CHOLHDL, LDLDIRECT in the last 72 hours. Thyroid Function Tests: No results for input(s): TSH, T4TOTAL, FREET4, T3FREE, THYROIDAB in the last 72 hours. Anemia Panel: No results for input(s): VITAMINB12, FOLATE, FERRITIN, TIBC, IRON, RETICCTPCT in the last 72  hours. Sepsis Labs: Recent Labs  Lab 03/04/17 2259  LATICACIDVEN 0.56    Recent Results (from the past 240 hour(s))  Blood culture (routine x 2)     Status: None   Collection Time: 03/04/17 10:47 PM  Result Value Ref Range Status   Specimen Description BLOOD RIGHT HAND  Final   Special Requests   Final    BOTTLES DRAWN AEROBIC AND ANAEROBIC Blood Culture adequate volume   Culture   Final    NO GROWTH 5 DAYS Performed at [facility], [address], [place], [place] [address]    Report Status 03/10/2017 FINAL  Final  Blood culture (routine x 2)     Status: None   Collection Time: 03/04/17 10:47 PM  Result Value Ref Range Status   Specimen Description BLOOD LEFT ANTECUBITAL  Final   Special Requests   Final    BOTTLES DRAWN AEROBIC AND ANAEROBIC Blood Culture adequate volume   Culture   Final    NO GROWTH 5 DAYS Performed at [facility], [address], [place], [place] [address]    Report Status 03/10/2017 FINAL  Final   Respiratory Panel by PCR     Status: Abnormal   Collection Time: 03/05/17  3:26 PM  Result Value Ref Range Status   Adenovirus NOT DETECTED NOT DETECTED Final   Coronavirus 229E NOT DETECTED NOT DETECTED Final   Coronavirus HKU1 NOT DETECTED NOT DETECTED Final   Coronavirus NL63 NOT DETECTED NOT DETECTED Final   Coronavirus OC43 NOT DETECTED NOT DETECTED Final   Metapneumovirus NOT DETECTED NOT DETECTED Final   Rhinovirus / Enterovirus NOT DETECTED NOT DETECTED Final   Influenza A NOT DETECTED NOT DETECTED Final   Influenza B NOT DETECTED NOT DETECTED Final   Parainfluenza Virus 1 NOT DETECTED NOT DETECTED Final   Parainfluenza Virus 2 NOT DETECTED NOT DETECTED Final   Parainfluenza Virus 3 NOT DETECTED NOT DETECTED Final   Parainfluenza Virus 4 DETECTED (A) NOT DETECTED Final   Respiratory Syncytial Virus NOT DETECTED NOT DETECTED Final   Bordetella pertussis NOT DETECTED NOT DETECTED Final   Chlamydophila pneumoniae NOT DETECTED NOT DETECTED Final   Mycoplasma pneumoniae NOT DETECTED NOT DETECTED Final    Comment: Performed at [facility], [address] [place], [place] [address]  MRSA PCR Screening     Status: None   Collection Time: 03/05/17  3:26 PM  Result Value Ref Range Status   MRSA by PCR NEGATIVE NEGATIVE Final    Comment:        The GeneXpert MRSA Assay (FDA approved for NASAL specimens only), is one component of a comprehensive MRSA colonization surveillance program. It is not intended to diagnose MRSA infection nor to guide or monitor treatment for MRSA infections.          Radiology Studies: Dg Chest Port 1 View  Result Date: 03/11/2017 CLINICAL DATA:  Follow-up pneumonia EXAM: PORTABLE CHEST 1 VIEW COMPARISON:  03/04/2017 FINDINGS: Progressive retrocardiac opacity. Borderline heart size. Aortic tortuosity. 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 No edema, effusion, or  pneumothorax.  Right perihilar atelectasis. IMPRESSION: History of pneumonia with progressive retrocardiac opacification. Electronically Signed   By: Marnee Spring M.D.   On: 03/11/2017 15:23        Scheduled Meds: . amiodarone  200 mg Oral Daily  . amitriptyline  75 mg Oral QHS  . arformoterol  15 mcg Nebulization BID  .  aspirin EC  81 mg Oral Daily  . budesonide (PULMICORT) nebulizer solution  0.5 mg Nebulization BID  . cholecalciferol  2,000 Units Oral Daily  . diltiazem  300 mg Oral Daily  . enoxaparin (LOVENOX) injection  40 mg Subcutaneous Q24H  . fluticasone  2 spray Each Nare Daily  . guaiFENesin  1,200 mg Oral BID  . hydrochlorothiazide  25 mg Oral Q M,W,F  . HYDROcodone-acetaminophen  1 tablet Oral TID  . ipratropium  0.5 mg Nebulization TID  . levalbuterol  1.25 mg Nebulization TID  . lisinopril  40 mg Oral Daily  . loratadine  10 mg Oral Daily  . lubiprostone  8 mcg Oral Q breakfast  . mouth rinse  15 mL Mouth Rinse BID  . methimazole  5 mg Oral Daily  . mirtazapine  15 mg Oral QHS  . mycophenolate  500 mg Oral BID  . pantoprazole  40 mg Oral Daily  . PARoxetine  40 mg Oral Daily  . polyethylene glycol  17 g Oral BID  . polyvinyl alcohol  1 drop Both Eyes BID  . QUEtiapine  12.5 mg Oral QHS  . saccharomyces boulardii  250 mg Oral BID  . senna-docusate  1 tablet Oral BID  . sodium chloride HYPERTONIC  4 mL Nebulization BID  . spironolactone  25 mg Oral Daily   Continuous Infusions: . ceFEPime (MAXIPIME) IV       LOS: 6 days    Time spent: 35 mins    Ramiro Harvestaniel Khori Underberg, MD [facility] Pager [phone]  If 7PM-7AM, please contact night-coverage [email or web address] Password [email or web address] 03/11/2017, 5:03 PM

## 2017-03-11 NOTE — Progress Notes (Signed)
Pt. able to generate .95 L on FVC along with -35 cmh20 on NIF both with good effort, remains to have loose cough with occational small amount, "yellow,per pt." produced.

## 2017-03-11 NOTE — Plan of Care (Signed)
  Progressing Clinical Measurements: Ability to maintain a body temperature in the normal range will improve 03/11/2017 2141 - Progressing by Cristela FeltSteffens, Kairy Folsom P, RN Respiratory: Ability to maintain adequate ventilation will improve 03/11/2017 2141 - Progressing by Cristela FeltSteffens, Jiovany Scheffel P, RN Ability to maintain a clear airway will improve 03/11/2017 2141 - Progressing by Cristela FeltSteffens, Melinna Linarez P, RN Health Behavior/Discharge Planning: Ability to manage health-related needs will improve 03/11/2017 2141 - Progressing by Cristela FeltSteffens, Dawanda Mapel P, RN Clinical Measurements: Ability to maintain clinical measurements within normal limits will improve 03/11/2017 2141 - Progressing by Cristela FeltSteffens, Saifan Rayford P, RN Will remain free from infection 03/11/2017 2141 - Progressing by Cristela FeltSteffens, Chidera Dearcos P, RN Diagnostic test results will improve 03/11/2017 2141 - Progressing by Cristela FeltSteffens, Real Cona P, RN Respiratory complications will improve 03/11/2017 2141 - Progressing by Cristela FeltSteffens, Peace Noyes P, RN Cardiovascular complication will be avoided 03/11/2017 2141 - Progressing by Cristela FeltSteffens, Yancy Knoble P, RN

## 2017-03-11 NOTE — Consult Note (Signed)
Name: Paula Bird MRN: [id number] DOB: 1925-05-31    ADMISSION DATE:  03/04/2017 CONSULTATION DATE: 03/12/17  REFERRING MD :  Dr. Janee Morn   CHIEF COMPLAINT:  HCAP    HISTORY OF PRESENT ILLNESS:  83 y/o F admitted 12/10 with a one week history of increasing shortness of breath and thick sputum production.   The patient has a history of myasthenia gravis, PAF, COPD, CKD & autoimmune thyroid disease.  On admit sh reported a one week history of worsening shortness of breath, cough with difficulty clearing thick sputum.  She was reportedly treated with antibiotics but did not feel better (at [facility]).  She denied fevers / chills.  Initial ER evaluation was concerning for possible pneumonia on CXR and wheezing on exam.  She was admitted by [facility] for further care. The patient was treated with Zyvox and cefepime for HCAP.  NIF and VC were serially checked.  She was placed on bronchodilators, mucolytic's & pulmonary hygiene.  Despite this, the patient continued to have difficulty clearing secretions.    PCCM consulted for evaluation.   PAST MEDICAL HISTORY :   has a past medical history of Acute and chronic respiratory failure, unspecified whether with hypoxia or hypercapnia (HCC), Afib (HCC), Allergic rhinitis, Anemia, CKD (chronic kidney disease), Constipation, COPD (chronic obstructive pulmonary disease) (HCC), Depression, Diplopia, Dysphagia, Edema, lower extremity, Esophageal dysmotility, Gait instability, GERD (gastroesophageal reflux disease), Hypertension, Hypothyroidism, IBS (irritable bowel syndrome), IDA (iron deficiency anemia), Insomnia, Leukocytosis, Myasthenia gravis (HCC), Oral thrush, PAF (paroxysmal atrial fibrillation) (HCC), Physical deconditioning, Protein calorie malnutrition (HCC), Thyroid disease, Thyroid mass, and Vertigo.   has a past surgical history that includes Abdominal hysterectomy; Appendectomy; Total hip arthroplasty (Left); and Total knee  arthroplasty (Left).  Prior to Admission medications   Medication Sig Start Date End Date Taking? Authorizing Provider  amiodarone (PACERONE) 200 MG tablet Take 200 mg by mouth daily.    Yes [provider]  amitriptyline (ELAVIL) 75 MG tablet Take 75 mg by mouth at bedtime. 07/12/15  Yes [provider]  aspirin EC 81 MG tablet Take 81 mg by mouth daily.   Yes [provider]  calcium-vitamin D (OSCAL WITH D) 500-200 MG-UNIT tablet Take 1 tablet by mouth 2 (two) times daily.   Yes [provider]  Cholecalciferol (VITAMIN D3) 2000 units TABS Take 2,000 Units by mouth daily.    Yes [provider]  Cranberry 425 MG CAPS Take 425 mg by mouth 2 (two) times daily.   Yes [provider]  diltiazem (CARDIZEM CD) 300 MG 24 hr capsule Take 300 mg by mouth daily. 03/04/16  Yes [provider]  esomeprazole (NEXIUM) 20 MG capsule Take 20 mg by mouth 2 (two) times daily.    Yes [provider]  hydrALAZINE (APRESOLINE) 25 MG tablet Take 25 mg by mouth 3 (three) times daily.   Yes [provider]  hydrochlorothiazide (HYDRODIURIL) 25 MG tablet Take 1 tablet (25 mg total) by mouth every Monday, Wednesday, and Friday. 08/16/15  Yes [name], MD  HYDROcodone-acetaminophen (NORCO/VICODIN) 5-325 MG tablet Take 1 tablet by mouth 4 (four) times daily. For pain 10/31/16  Yes [provider]  hydroxypropyl methylcellulose / hypromellose (ISOPTO TEARS / GONIOVISC) 2.5 % ophthalmic solution Place 1 drop into both eyes 2 (two) times daily.   Yes [provider]  lisinopril (PRINIVIL,ZESTRIL) 40 MG tablet Take 40 mg by mouth daily. 03/04/16  Yes [provider]  lubiprostone (AMITIZA) 8 MCG capsule Take  8 mcg by mouth daily with breakfast.   Yes [provider]  meloxicam (MOBIC) 15 MG tablet Take 15 mg by mouth daily.   Yes [provider]  methimazole (TAPAZOLE) 5 MG tablet Take 1 tablet (5 mg  total) by mouth daily. Patient taking differently: Take 5 mg by mouth at bedtime.  07/24/16  Yes Romero BellingEllison, Sean, MD  Multiple Vitamins-Minerals (MULTIVITAMIN GUMMIES ADULT PO) Take 1 tablet by mouth daily.   Yes [provider]  mycophenolate (CELLCEPT) 500 MG tablet Take 1 tablet (500 mg total) by mouth 2 (two) times daily. 10/16/16  Yes Levert FeinsteinYan, Yijun, MD  PARoxetine (PAXIL) 40 MG tablet Take 40 mg by mouth daily. 07/23/15  Yes [provider]  polyethylene glycol (MIRALAX / GLYCOLAX) packet Take 17 g by mouth daily. Patient taking differently: Take 17 g by mouth 2 (two) times daily.  08/27/14  Yes Harris, Abigail, PA-C  QUEtiapine (SEROQUEL) 25 MG tablet Take 12.5 mg by mouth at bedtime.    Yes [provider]  saccharomyces boulardii (FLORASTOR) 250 MG capsule Take 250 mg by mouth 2 (two) times daily.   Yes [provider]  sennosides-docusate sodium (SENOKOT-S) 8.6-50 MG tablet Take 1 tablet by mouth 2 (two) times daily. 08/16/15  Yes Albertine GratesXu, Fang, MD  spironolactone (ALDACTONE) 25 MG tablet Take 25 mg by mouth daily.   Yes [provider]  traZODone (DESYREL) 150 MG tablet Take 75 mg by mouth at bedtime.    Yes [provider]  bisacodyl (DULCOLAX) 10 MG suppository Place 10 mg rectally daily as needed for moderate constipation.    [provider]  SUMAtriptan (IMITREX) 25 MG tablet Take 50 mg by mouth daily as needed for migraine. May repeat in 2 hours if headache persists or recurs.    [provider]  traMADol (ULTRAM) 50 MG tablet Take by mouth every 12 (twelve) hours as needed.    [provider]    No Known Allergies  FAMILY HISTORY:  family history includes Hypertension in her mother and son.  SOCIAL HISTORY:  reports that  has never smoked. she has never used smokeless tobacco. She reports that she does not drink alcohol or use drugs.  REVIEW OF SYSTEMS:  POSITIVES IN BOLD Constitutional: Negative for fever,  chills, weight loss, malaise/fatigue and diaphoresis.  HENT: Negative for hearing loss, ear pain, nosebleeds, congestion, sore throat, neck pain, tinnitus and ear discharge.   Eyes: Negative for blurred vision, double vision, photophobia, pain, discharge and redness.  Respiratory: Negative for cough with sputum production, hemoptysis, sputum production, shortness of breath, wheezing and stridor.   Cardiovascular: Negative for chest pain, palpitations, orthopnea, claudication, leg swelling and PND.  Gastrointestinal: Negative for heartburn, nausea, vomiting, abdominal pain, diarrhea, constipation, blood in stool and melena.  Genitourinary: Negative for dysuria, urgency, frequency, hematuria and flank pain.  Musculoskeletal: Negative for myalgias, back pain, joint pain and falls.  Skin: Negative for itching and rash.  Neurological: Negative for dizziness, tingling, tremors, sensory change, speech change, focal weakness, seizures, loss of consciousness, weakness and headaches.  Endo/Heme/Allergies: Negative for environmental allergies and polydipsia. Does not bruise/bleed easily.  SUBJECTIVE:   VITAL SIGNS: Temp:  [98.3 F (36.8 C)-98.5 F (36.9 C)] 98.3 F (36.8 C) (12/17 1550) Pulse Rate:  [52-67] 59 (12/17 1550) Resp:  [18] 18 (12/17 1550) BP: (114-145)/(51-67) 115/55 (12/17 1550) SpO2:  [90 %-96 %] 95 % (12/17 1550) Weight:  [145 lb 1 oz (65.8 kg)] 145 lb 1 oz (65.8  kg) (12/17 0446)  PHYSICAL EXAMINATION: General:  Ill appearing elderly woman HEENT: MM pink/moist PSY: awake and alert, interacts appropriately  Neuro: moves all ext, no focal deficits  CV: s1s2 rrr, no m/r/g PULM: coarse bilaterally, no wheeze ZO:XWRUGI:soft, non-tender, bsx4 active  Extremities: warm/dry, no edema  Skin: no rashes or lesions   Recent Labs  Lab 03/09/17 0525 03/10/17 0521 03/11/17 0436  NA 134* 136 133*  K 4.3 4.4 4.2  CL 101 102 100*  CO2 26 29 26   BUN 34* 36* 37*  CREATININE 0.66 0.72 0.62    GLUCOSE 133* 139* 158*    Recent Labs  Lab 03/08/17 0517 03/09/17 0525 03/11/17 0436  HGB 11.2* 11.7* 12.1  HCT 35.6* 36.6 37.8  WBC 7.3 6.7 8.0  PLT 158 186 162    Dg Chest Port 1 View  Result Date: 03/11/2017 CLINICAL DATA:  Follow-up pneumonia EXAM: PORTABLE CHEST 1 VIEW COMPARISON:  03/04/2017 FINDINGS: Progressive retrocardiac opacity. Borderline heart size. Aortic tortuosity. 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 No edema, effusion, or pneumothorax.  Right perihilar atelectasis. IMPRESSION: History of pneumonia with progressive retrocardiac opacification. Electronically Signed   By: Marnee SpringJonathon  Watts M.D.   On: 03/11/2017 15:23      SIGNIFICANT EVENTS  12/10  Admit  12/17  PCCM consulted for evaluation of secretion clearance   STUDIES:  12/17  CXR >> progressive retrocardiac opacification   ASSESSMENT / PLAN:  Discussion: 83 y/o F with a hx myasthenia, admitted with concern for HCAP.  Treated with IV abx, mucolytic's, bronchodilators but continued to have difficulty clearing secretions.  Follow up CXR 12/17 raises concern for worsening retrocardiac opacity. NIF / VC have remained stable.    HCAP  Myasthenia Gravis   Plan: Frequent cough and some difficulty moving secretions although not clear to me that secretions are pooling or that she is in any degree of respiratory distress.  Would continue with mucolytics, scheduled bronchodilators, loratadine, Flonase, pantoprazole to address any contributing component of esophageal reflux.  Add bed percussion or chest vest (she has sometimes refused). Certainly she would likely benefit at least with regard to the upper airway component of her cough from changing her lisinopril to an alternative. Agree with d/c abx.   With regard to her myasthenia, continue to follow and if and vital capacity to ensure that they are stable.  She is on immunosuppressive medication  but is not on Mestinon. ? Whether this is indicated.   Please call us if we can assist in any way.    Levy Pupaobert Prinston Kynard, MD, PhD 03/12/2017, 1:48 PM [facility] [phone] or if no answer [phone]

## 2017-03-11 NOTE — Progress Notes (Signed)
NIF -36, VC 4.1L

## 2017-03-11 NOTE — Progress Notes (Signed)
Pharmacy Antibiotic Note  Paula Bird is a 83 y.o. female admitted on 03/04/2017 with pneumonia.  Antibiotics narrowed from Cefepime to Augmentin on 03/08/2017. CXR today shows worsening pneumonia, and pharmacy has been consulted to resume Cefepime.   Plan: Cefepime 1g IV q12h. Monitor renal function, cultures, clinical course.   Height: 5' (152.4 cm) Weight: 145 lb 1 oz (65.8 kg) IBW/kg (Calculated) : 45.5  Temp (24hrs), Avg:98.4 F (36.9 C), Min:98.3 F (36.8 C), Max:98.5 F (36.9 C)  Recent Labs  Lab 03/04/17 2259  03/06/17 0415 03/07/17 0525 03/08/17 0517 03/09/17 0525 03/10/17 0521 03/11/17 0436  WBC  --    < > 6.8 7.6 7.3 6.7  --  8.0  CREATININE  --    < > 0.69 0.67 0.82 0.66 0.72 0.62  LATICACIDVEN 0.56  --   --   --   --   --   --   --    < > = values in this interval not displayed.    Estimated Creatinine Clearance: 40.3 mL/min (by C-G formula based on SCr of 0.62 mg/dL).    No Known Allergies  Antimicrobials this admission: 12/11 Vanc x 1 12/11 Zyvox x 1 12/11 Cefepime >> 12/14, resumed 12/17 >> 12/14 Augmentin >> 12/17  Microbiology results: 12/10 BCx: NGF 12/11 MRSA PCR: neg 12/11 Respiratory panel: parainfluenza virus 4 detected   Thank you for allowing pharmacy to be a part of this patient's care.   Greer PickerelJigna Alyscia Carmon, PharmD, BCPS Pager: [phone] 03/11/2017 6:41 PM

## 2017-03-12 DIAGNOSIS — R0902 Hypoxemia: Secondary | ICD-10-CM

## 2017-03-12 DIAGNOSIS — E0590 Thyrotoxicosis, unspecified without thyrotoxic crisis or storm: Secondary | ICD-10-CM

## 2017-03-12 DIAGNOSIS — B348 Other viral infections of unspecified site: Secondary | ICD-10-CM

## 2017-03-12 DIAGNOSIS — J189 Pneumonia, unspecified organism: Secondary | ICD-10-CM

## 2017-03-12 DIAGNOSIS — F329 Major depressive disorder, single episode, unspecified: Secondary | ICD-10-CM

## 2017-03-12 DIAGNOSIS — I10 Essential (primary) hypertension: Secondary | ICD-10-CM

## 2017-03-12 DIAGNOSIS — G7000 Myasthenia gravis without (acute) exacerbation: Secondary | ICD-10-CM

## 2017-03-12 DIAGNOSIS — J441 Chronic obstructive pulmonary disease with (acute) exacerbation: Secondary | ICD-10-CM

## 2017-03-12 LAB — CBC
HEMATOCRIT: 38.3 % (ref 36.0–46.0)
HEMOGLOBIN: 12 g/dL (ref 12.0–15.0)
MCH: 28.8 pg (ref 26.0–34.0)
MCHC: 31.3 g/dL (ref 30.0–36.0)
MCV: 91.8 fL (ref 78.0–100.0)
Platelets: 173 10*3/uL (ref 150–400)
RBC: 4.17 MIL/uL (ref 3.87–5.11)
RDW: 14.9 % (ref 11.5–15.5)
WBC: 8.2 10*3/uL (ref 4.0–10.5)

## 2017-03-12 LAB — BASIC METABOLIC PANEL
ANION GAP: 8 (ref 5–15)
BUN: 36 mg/dL — ABNORMAL HIGH (ref 6–20)
CALCIUM: 8.5 mg/dL — AB (ref 8.9–10.3)
CHLORIDE: 100 mmol/L — AB (ref 101–111)
CO2: 25 mmol/L (ref 22–32)
Creatinine, Ser: 0.75 mg/dL (ref 0.44–1.00)
GFR calc non Af Amer: >60 mL/min (ref >60)
GLUCOSE: 98 mg/dL (ref 65–99)
POTASSIUM: 4.8 mmol/L (ref 3.5–5.1)
Sodium: 133 mmol/L — ABNORMAL LOW (ref 135–145)

## 2017-03-12 MED ORDER — PROMETHAZINE HCL 25 MG PO TABS: 25.0000 mg | ORAL_TABLET | Freq: Four times a day (QID) | ORAL | Status: DC | PRN

## 2017-03-12 MED ORDER — ZOLPIDEM TARTRATE 5 MG PO TABS
5.0000 mg | ORAL_TABLET | Freq: Once | ORAL | Status: AC
  Administered 2017-03-13: 5 mg via ORAL
  Filled 2017-03-12: qty 1

## 2017-03-12 NOTE — Progress Notes (Signed)
RT in to see pt., multiple staff at bedside with pt. c/o stomach being upset, mouth being dry and some trouble breathing, NIF/VC obtained: NIF(-30)/FVC-1.3 L, added humidifier to 2 lpm n/c oxygen and had pt. rinse after aerosol tx.'s finished, b/l b.s. remains diminished with strong coarse non-productive dry cough, CPT held @ this time, RT to monitor.

## 2017-03-12 NOTE — Progress Notes (Signed)
PROGRESS NOTE    Paula Evangelistvelyn Jaskot  MVH:[id number]:[id number] DOB: 10/18/1925 DOA: 03/04/2017 PCP: Angela Coxasanayaka, Gayani Y, MD   Brief Narrative:  History of myasthenia gravis, paroxysmal atrial fibrillation, COPD presented to the ED with complaints of shortness of breath times 1 week with a productive cough.  Patient noted to be on antibiotics however still with shortness of breath and presented to the ED.  Chest x-ray showed possible pneumonia.  Patient noted to be wheezing bilaterally on examination.  Patient started on antibiotics of Zyvox and cefepime for pneumonia.  Patient also on treatments.   Assessment & Plan:   Healthcare associated pneumonia secondary to parainfluenza virus #4 -Likely viral pneumonia, also treated with broad-spectrum antibiotics this hospitalization today is day 8 of ABx -Afebrile, blood cultures negative -Stop IV cefepime -Continue nebs, physical therapy, pulmonary toilet if possible -Monitor off antibiotics, has a history of myasthenia gravis however her NIF has been around -40 and stable -Pulm consulted by Dr.Thompson?? Will follow up -also check SLP eval  Myasthenia gravis Continue CellCept. - NIF stable,  Continue flutter valve.  Hyperthyroidism Continue Tapazole.  Hypertension Continue Cardizem, lisinopril.  COPD -stable now, continue scheduled Xopenex nebs, Atrovent scheduled nebs, Pulmicort to 0.5 mg twice daily, Brovana, Claritin, Flonase, chest PT. Prednisone tapered off  Atrial fibrillation Continue amiodarone and Cardizem for rate control.  Not an Anticoagulation candidate secondary to falls.  Aspirin.  Insomnia -on trazodone and elavil, now Remeron added, monitor  DVT prophylaxis: Lovenox Code Status: DNR Family Communication: Updated patient.  No family at bedside. [facility] Consultants:   Pulmonary pending 03/12/2017  Procedures:   Chest x-ray 03/04/2017, 03/11/2017    Antimicrobials:   IV  cefepime 03/04/2017>>>>> 03/08/2017  IV vancomycin 03/04/2017>>>> 03/05/2017  IV Zyvox x1 dose 03/05/2017  Augmentin 03/08/2017>>>>> 03/11/2017  IV cefepime 03/11/2017    Subjective: -feels ok, didn't sleep well, breathing better, occasional cough  Objective: Vitals:   03/12/17 0434 03/12/17 0436 03/12/17 0922 03/12/17 1336  BP: (!) 158/72   (!) 103/56  Pulse: (!) 57   (!) 55  Resp: 17   18  Temp: 97.7 F (36.5 C)   98 F (36.7 C)  TempSrc: Oral   Oral  SpO2: 95%  95% 96%  Weight:  65.8 kg (145 lb 1 oz)    Height:        Intake/Output Summary (Last 24 hours) at 03/12/2017 1402 Last data filed at 03/12/2017 0600 Gross per 24 hour  Intake 460 ml  Output -  Net 460 ml   Filed Weights   03/10/17 0532 03/11/17 0446 03/12/17 0436  Weight: 66.5 kg (146 lb 9.7 oz) 65.8 kg (145 lb 1 oz) 65.8 kg (145 lb 1 oz)    Examination:  Gen: Awake, Alert, Oriented X 3, no distress HEENT: PERRLA, Neck supple, no JVD Lungs: few basilar ronchi on R CVS: RRR,No Gallops,Rubs or new Murmurs Abd: soft, Non tender, non distended, BS present Extremities: No Cyanosis, Clubbing or edema Skin: no new rashes  Data Reviewed: I have personally reviewed following labs and imaging studies  CBC: Recent Labs  Lab 03/06/17 0415 03/07/17 0525 03/08/17 0517 03/09/17 0525 03/11/17 0436 03/12/17 0453  WBC 6.8 7.6 7.3 6.7 8.0 8.2  NEUTROABS 6.2 6.7 6.2 5.6  --   --   HGB 11.4* 11.4* 11.2* 11.7* 12.1 12.0  HCT 35.5* 35.8* 35.6* 36.6 37.8 38.3  MCV 90.6 90.9 91.3 91.3 91.5 91.8  PLT 202 167 158 186 162 173  Basic Metabolic Panel: Recent Labs  Lab 03/06/17 0415  03/08/17 0517 03/09/17 0525 03/10/17 0521 03/11/17 0436 03/12/17 0453  NA 134*   < > 135 134* 136 133* 133*  K 3.9   < > 4.5 4.3 4.4 4.2 4.8  CL 103   < > 102 101 102 100* 100*  CO2 23   < > 27 26 29 26 25   GLUCOSE 147*   < > 133* 133* 139* 158* 98  BUN 26*   < > 28* 34* 36* 37* 36*  CREATININE 0.69   < > 0.82 0.66 0.72  0.62 0.75  CALCIUM 8.5*   < > 8.9 9.1 8.7* 8.5* 8.5*  MG 2.2  --   --   --   --   --   --    < > = values in this interval not displayed.   GFR: Estimated Creatinine Clearance: 40.3 mL/min (by C-G formula based on SCr of 0.75 mg/dL). Liver Function Tests: Recent Labs  Lab 03/06/17 0415  AST 27  ALT 43  ALKPHOS 40  BILITOT 0.5  PROT 5.9*  ALBUMIN 3.6   No results for input(s): LIPASE, AMYLASE in the last 168 hours. No results for input(s): AMMONIA in the last 168 hours. Coagulation Profile: No results for input(s): INR, PROTIME in the last 168 hours. Cardiac Enzymes: No results for input(s): CKTOTAL, CKMB, CKMBINDEX, TROPONINI in the last 168 hours. BNP (last 3 results) No results for input(s): PROBNP in the last 8760 hours. HbA1C: No results for input(s): HGBA1C in the last 72 hours. CBG: Recent Labs  Lab 03/08/17 1159 03/08/17 1639  GLUCAP 123* 163*   Lipid Profile: No results for input(s): CHOL, HDL, LDLCALC, TRIG, CHOLHDL, LDLDIRECT in the last 72 hours. Thyroid Function Tests: No results for input(s): TSH, T4TOTAL, FREET4, T3FREE, THYROIDAB in the last 72 hours. Anemia Panel: No results for input(s): VITAMINB12, FOLATE, FERRITIN, TIBC, IRON, RETICCTPCT in the last 72 hours. Sepsis Labs: No results for input(s): PROCALCITON, LATICACIDVEN in the last 168 hours.  Recent Results (from the past 240 hour(s))  Blood culture (routine x 2)     Status: None   Collection Time: 03/04/17 10:47 PM  Result Value Ref Range Status   Specimen Description BLOOD RIGHT HAND  Final   Special Requests   Final    BOTTLES DRAWN AEROBIC AND ANAEROBIC Blood Culture adequate volume   Culture   Final    NO GROWTH 5 DAYS Performed at [facility], [address] [place], [place] [address]    Report Status 03/10/2017 FINAL  Final  Blood culture (routine x 2)     Status: None   Collection Time: 03/04/17 10:47 PM  Result Value Ref Range Status   Specimen Description BLOOD LEFT  ANTECUBITAL  Final   Special Requests   Final    BOTTLES DRAWN AEROBIC AND ANAEROBIC Blood Culture adequate volume   Culture   Final    NO GROWTH 5 DAYS Performed at [facility], [address] [place], [place] [address]    Report Status 03/10/2017 FINAL  Final  Respiratory Panel by PCR     Status: Abnormal   Collection Time: 03/05/17  3:26 PM  Result Value Ref Range Status   Adenovirus NOT DETECTED NOT DETECTED Final   Coronavirus 229E NOT DETECTED NOT DETECTED Final   Coronavirus HKU1 NOT DETECTED NOT DETECTED Final   Coronavirus NL63 NOT DETECTED NOT DETECTED Final   Coronavirus OC43 NOT DETECTED NOT  DETECTED Final   Metapneumovirus NOT DETECTED NOT DETECTED Final   Rhinovirus / Enterovirus NOT DETECTED NOT DETECTED Final   Influenza A NOT DETECTED NOT DETECTED Final   Influenza B NOT DETECTED NOT DETECTED Final   Parainfluenza Virus 1 NOT DETECTED NOT DETECTED Final   Parainfluenza Virus 2 NOT DETECTED NOT DETECTED Final   Parainfluenza Virus 3 NOT DETECTED NOT DETECTED Final   Parainfluenza Virus 4 DETECTED (A) NOT DETECTED Final   Respiratory Syncytial Virus NOT DETECTED NOT DETECTED Final   Bordetella pertussis NOT DETECTED NOT DETECTED Final   Chlamydophila pneumoniae NOT DETECTED NOT DETECTED Final   Mycoplasma pneumoniae NOT DETECTED NOT DETECTED Final    Comment: Performed at [facility], [address] [place], [place] [address]  MRSA PCR Screening     Status: None   Collection Time: 03/05/17  3:26 PM  Result Value Ref Range Status   MRSA by PCR NEGATIVE NEGATIVE Final    Comment:        The GeneXpert MRSA Assay (FDA approved for NASAL specimens only), is one component of a comprehensive MRSA colonization surveillance program. It is not intended to diagnose MRSA infection nor to guide or monitor treatment for MRSA infections.          Radiology Studies: Dg Chest Port 1 View  Result Date: 03/11/2017 CLINICAL DATA:  Follow-up pneumonia  EXAM: PORTABLE CHEST 1 VIEW COMPARISON:  03/04/2017 FINDINGS: Progressive retrocardiac opacity. Borderline heart size. Aortic tortuosity. 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 No edema, effusion, or pneumothorax.  Right perihilar atelectasis. IMPRESSION: History of pneumonia with progressive retrocardiac opacification. Electronically Signed   By: Marnee Spring M.D.   On: 03/11/2017 15:23        Scheduled Meds: . amiodarone  200 mg Oral Daily  . amitriptyline  75 mg Oral QHS  . arformoterol  15 mcg Nebulization BID  . aspirin EC  81 mg Oral Daily  . budesonide (PULMICORT) nebulizer solution  0.5 mg Nebulization BID  . cholecalciferol  2,000 Units Oral Daily  . diltiazem  300 mg Oral Daily  . enoxaparin (LOVENOX) injection  40 mg Subcutaneous Q24H  . fluticasone  2 spray Each Nare Daily  . guaiFENesin  1,200 mg Oral BID  . hydrochlorothiazide  25 mg Oral Q M,W,F  . HYDROcodone-acetaminophen  1 tablet Oral TID  . ipratropium  0.5 mg Nebulization TID  . levalbuterol  1.25 mg Nebulization TID  . lisinopril  40 mg Oral Daily  . loratadine  10 mg Oral Daily  . lubiprostone  8 mcg Oral Q breakfast  . mouth rinse  15 mL Mouth Rinse BID  . methimazole  5 mg Oral Daily  . mirtazapine  15 mg Oral QHS  . mycophenolate  500 mg Oral BID  . pantoprazole  40 mg Oral Daily  . PARoxetine  40 mg Oral Daily  . polyethylene glycol  17 g Oral BID  . polyvinyl alcohol  1 drop Both Eyes BID  . QUEtiapine  12.5 mg Oral QHS  . saccharomyces boulardii  250 mg Oral BID  . senna-docusate  1 tablet Oral BID  . sodium chloride HYPERTONIC  4 mL Nebulization BID  . spironolactone  25 mg Oral Daily   Continuous Infusions:    LOS: 7 days    Time spent: 35 mins    Zannie Cove, MD [facility] Page via [email or web address] password [email or web address]  If 7PM-7AM, please contact  night-coverage  03/12/2017, 2:02 PM

## 2017-03-12 NOTE — Care Management Important Message (Signed)
Important Message  Patient Details  Name: Paula Bird MRN: [id number] Date of Birth: 1926-01-18   Medicare Important Message Given:  Yes    Caren MacadamFuller, Nakshatra Klose 03/12/2017, 12:22 PMImportant Message  Patient Details  Name: Paula Bird MRN: [id number] Date of Birth: 1926-01-18   Medicare Important Message Given:  Yes    Caren MacadamFuller, Ligia Duguay 03/12/2017, 12:22 PM

## 2017-03-12 NOTE — Evaluation (Signed)
Clinical/Bedside Swallow Evaluation Patient Details  Name: Paula Bird MRN: [id number] Date of Birth: 1925/06/29  Today's Date: 03/12/2017 Time: SLP Start Time (ACUTE ONLY): 1025 SLP Stop Time (ACUTE ONLY): 1035 SLP Time Calculation (min) (ACUTE ONLY): 10 min  Past Medical History:  Past Medical History:  Diagnosis Date  . Acute and chronic respiratory failure, unspecified whether with hypoxia or hypercapnia (HCC)   . Afib (HCC)   . Allergic rhinitis   . Anemia   . CKD (chronic kidney disease)   . Constipation   . COPD (chronic obstructive pulmonary disease) (HCC)   . Depression   . Diplopia   . Dysphagia   . Edema, lower extremity   . Esophageal dysmotility   . Gait instability   . GERD (gastroesophageal reflux disease)   . Hypertension   . Hypothyroidism   . IBS (irritable bowel syndrome)   . IDA (iron deficiency anemia)   . Insomnia   . Leukocytosis   . Myasthenia gravis (HCC)   . Oral thrush   . PAF (paroxysmal atrial fibrillation) (HCC)   . Physical deconditioning   . Protein calorie malnutrition (HCC)   . Thyroid disease   . Thyroid mass   . Vertigo    Past Surgical History:  Past Surgical History:  Procedure Laterality Date  . ABDOMINAL HYSTERECTOMY    . APPENDECTOMY    . TOTAL HIP ARTHROPLASTY Left   . TOTAL KNEE ARTHROPLASTY Left    HPI:  83 y.o. female with history of myasthenia gravis, paroxysmal atrial fibrillation, COPD presented to the ED with complaints of shortness of breath times 1 week with a productive cough. Patient noted to be on antibiotics however still with shortness of breath and presented to the ED. Chest x-ray showed possible pneumonia. Patient noted to be wheezing bilaterally on examination. Dx HCAP secondary to parainfluenza virus #4.   Assessment / Plan / Recommendation Clinical Impression  Pt presents with functional oropharyngeal swallow with no overt s/s of aspiration, adequate mastication and effort, brisk response.   Adequate timing of swallow/respiratory cycle.  Recommend continuing current diet.  Our services will sign off.  SLP Visit Diagnosis: Dysphagia, unspecified (R13.10)    Aspiration Risk  No limitations    Diet Recommendation     Medication Administration: Whole meds with liquid    Other  Recommendations Oral Care Recommendations: Oral care BID   Follow up Recommendations None      Frequency and Duration            Prognosis        Swallow Study   General HPI: 83 y.o. female with history of myasthenia gravis, paroxysmal atrial fibrillation, COPD presented to the ED with complaints of shortness of breath times 1 week with a productive cough. Patient noted to be on antibiotics however still with shortness of breath and presented to the ED. Chest x-ray showed possible pneumonia. Patient noted to be wheezing bilaterally on examination. Dx HCAP secondary to parainfluenza virus #4. Type of Study: Bedside Swallow Evaluation Previous Swallow Assessment: May 2017 bedside swallow; hx of MBS.  Symptoms of esophageal deficits in past but no oropharyngeal dysphagia Diet Prior to this Study: Regular;Thin liquids Temperature Spikes Noted: No Respiratory Status: Nasal cannula History of Recent Intubation: No Behavior/Cognition: Alert;Cooperative Oral Cavity Assessment: Within Functional Limits Oral Care Completed by SLP: No Oral Cavity - Dentition: Edentulous Vision: Functional for self-feeding Self-Feeding Abilities: Able to feed self Patient Positioning: Upright in bed Baseline Vocal Quality: Normal Volitional Cough: Strong Volitional  Swallow: Able to elicit    Oral/Motor/Sensory Function Overall Oral Motor/Sensory Function: Within functional limits   Ice Chips Ice chips: Within functional limits   Thin Liquid Thin Liquid: Within functional limits    Nectar Thick Nectar Thick Liquid: Not tested   Honey Thick Honey Thick Liquid: Not tested   Puree Puree: Within functional limits    Solid   GO   Solid: Within functional limits        Blenda MountsCouture, Cynthis Purington Laurice 03/12/2017,10:37 AM

## 2017-03-12 NOTE — Progress Notes (Signed)
NIF (best of 3 attempts)= approx -40  FVC (best of 3 attempts)= approx 1 L

## 2017-03-12 NOTE — Progress Notes (Signed)
Physical Therapy Treatment Patient Details Name: Paula Bird MRN: [id number] DOB: September 06, 1925 Today's Date: 03/12/2017    History of Present Illness 83 y.o. female with history of myasthenia gravis, paroxysmal atrial fibrillation, COPD presents to the ER with complaints of shortness of breath and admitted for acute respiratory failure with hypoxia likely from healthcare associated pneumonia and COPD exacerbation     PT Comments    Pt is from [facility] past 3 years.   Pt required increased assist and demonstrates limited activity tolerance and need for supplemental oxygen.  Very unsteady gait.  HIGH FALL RISK   SATURATION QUALIFICATIONS: (This note is used to comply with regulatory documentation for home oxygen)  Patient Saturations on Room Air at Rest = 92%  Patient Saturations on Room Air while Ambulating = 85%  Patient Saturations on 2 Liters of oxygen while Ambulating = 95%  Please briefly explain why patient needs home oxygen: pt required supplemental oxygen to achieve therapeutic levels   Follow Up Recommendations  Home health PT;Supervision/Assistance - 24 hour at ALF if able to care at current Min Assist level otherwise (with extended length of stay and slow medical progress, pt might need SNF)     Equipment Recommendations  None recommended by PT    Recommendations for Other Services       Precautions / Restrictions Precautions Precautions: Fall Precaution Comments: monitor sats Restrictions Weight Bearing Restrictions: Yes    Mobility  Bed Mobility Overal bed mobility: Needs Assistance Bed Mobility: Supine to Sit;Sit to Supine     Supine to sit: Min assist Sit to supine: Mod assist   General bed mobility comments: increased time and effort with max encouragement  Transfers Overall transfer level: Needs assistance Equipment used: Rolling walker (2 wheeled) Transfers: Sit to/from Stand Sit to Stand: Min assist         General transfer  comment: assist for rise and steady with posterior LOB  Ambulation/Gait Ambulation/Gait assistance: Min guard;Min assist Ambulation Distance (Feet): 50 Feet(25 feet ) Assistive device: Rolling walker (2 wheeled) Gait Pattern/deviations: Step-through pattern;Decreased stride length Gait velocity: decreased   General Gait Details: used rollator and amb on RA sats decreased to 85%.  Reapplied 2 lts to achieve 95%.     Stairs            Wheelchair Mobility    Modified Rankin (Stroke Patients Only)       Balance                                            Cognition Arousal/Alertness: Awake/alert Behavior During Therapy: WFL for tasks assessed/performed Overall Cognitive Status: Within Functional Limits for tasks assessed                                        Exercises      General Comments        Pertinent Vitals/Pain Pain Assessment: No/denies pain    Home Living                      Prior Function            PT Goals (current goals can now be found in the care plan section) Progress towards PT goals: Progressing toward goals    Frequency  Min 3X/week      PT Plan Current plan remains appropriate    Co-evaluation              AM-PAC PT "6 Clicks" Daily Activity  Outcome Measure  Difficulty turning over in bed (including adjusting bedclothes, sheets and blankets)?: Unable Difficulty moving from lying on back to sitting on the side of the bed? : Unable Difficulty sitting down on and standing up from a chair with arms (e.g., wheelchair, bedside commode, etc,.)?: Unable Help needed moving to and from a bed to chair (including a wheelchair)?: Total Help needed walking in hospital room?: Total Help needed climbing 3-5 steps with a railing? : Total 6 Click Score: 6    End of Session Equipment Utilized During Treatment: Oxygen Activity Tolerance: Patient limited by fatigue Patient left: in  bed;with call bell/phone within reach;with bed alarm set Nurse Communication: Mobility status PT Visit Diagnosis: Difficulty in walking, not elsewhere classified (R26.2)     Time: [id number] PT Time Calculation (min) (ACUTE ONLY): 17 min  Charges:  $Gait Training: 8-22 mins                    G Codes:       Felecia ShellingLori Bethanne Mule  PTA [facility]  Acute  Rehab Pager      [phone]

## 2017-03-13 MED ORDER — GUAIFENESIN ER 600 MG PO TB12: 600.0000 mg | ORAL_TABLET | Freq: Two times a day (BID) | ORAL | Status: DC

## 2017-03-13 MED ORDER — HYDROCODONE-ACETAMINOPHEN 5-325 MG PO TABS: 1.0000 | ORAL_TABLET | Freq: Four times a day (QID) | ORAL | 0 refills | Status: DC | PRN

## 2017-03-13 NOTE — Progress Notes (Signed)
Pt requested to wait on vest therapy due to she just ate her meal. Pt being d/c home as well today with no vest.

## 2017-03-13 NOTE — Progress Notes (Signed)
Per Pollyann SavoyAshley, [employer] rep, home 02 was set up by [facility] RN Brittany. [facility] made aware. Sandford Crazeora Arick Mareno RN,BSN,NCM [phone]

## 2017-03-13 NOTE — Discharge Summary (Addendum)
Physician Discharge Summary  Paula Bird [id number] DOB: May 23, 1925 DOA: 03/04/2017  PCP: Angela Coxasanayaka, Gayani Y, MD  Admit date: 03/04/2017 Discharge date: 03/13/2017  Time spent: 45 minutes  Recommendations for Outpatient Follow-up:  PCP in 1 week, FU CXR in 4-6weeks Home health PT/OT/Aide and Respiratory  Discharge Diagnoses:  Principal Problem:   HCAP (healthcare-associated pneumonia) Active Problems:   Hypertension   Depression   Obstructive chronic bronchitis with acute exacerbation (HCC)   Paroxysmal atrial fibrillation (HCC)   Essential hypertension   Myasthenia gravis (HCC)   Hyperthyroidism   Infection due to parainfluenza virus 4   Discharge Condition: stable  Diet recommendation: low sodium heart healthy  Filed Weights   03/10/17 0532 03/11/17 0446 03/12/17 0436  Weight: 66.5 kg (146 lb 9.7 oz) 65.8 kg (145 lb 1 oz) 65.8 kg (145 lb 1 oz)    History of present illness:  History of myasthenia gravis, paroxysmal atrial fibrillation, COPD presented to the ED with complaints of shortness of breath times 1 week with a productive cough  Hospital Course:  Healthcare associated pneumonia secondary to parainfluenza virus #4 -Likely viral pneumonia, also treated with broad-spectrum antibiotics this hospitalization using 8days of IV cefepime, stopped Abx yestedray as she completed course and is stable/improved now -afebrile, blood cultures negative -Continue nebs, physical therapy, pulmonary toilet if possible -history of myasthenia gravis however her NIF has been around -40 and stable -Pulm consulted by Dr.Thompson who agreed with current management and stopping Abx yesterday -SLP eval completed and felt to be unremarkable -stable at discharge, still needing O2 -@2litres  at discharge, taper off at ALF  Myasthenia gravis Continue CellCept. - NIF stable,  Continue flutter valve.  Hyperthyroidism Continue Tapazole.  Hypertension Continue Cardizem,  lisinopril.  COPD -stable now, continue scheduled Xopenex nebs, Atrovent scheduled nebs, Pulmicort to 0.5 mg twice daily,  Claritin, Flonase, received chest PT. Prednisone tapered off -now stable -discharged t ALF on Home O2  P.Atrial fibrillation Continue amiodarone and Cardizem for rate control.  Not an Anticoagulation candidate secondary to falls. -continue Aspirin.  Insomnia -on trazodone and elavil, -Remeron added and then stopped   Allergies as of 03/13/2017   No Known Allergies     Medication List    STOP taking these medications   hydrALAZINE 25 MG tablet Commonly known as:  APRESOLINE   levofloxacin 500 MG tablet Commonly known as:  LEVAQUIN   meloxicam 15 MG tablet Commonly known as:  MOBIC   QUEtiapine 25 MG tablet Commonly known as:  SEROQUEL     TAKE these medications   amiodarone 200 MG tablet Commonly known as:  PACERONE Take 200 mg by mouth daily.   amitriptyline 75 MG tablet Commonly known as:  ELAVIL Take 75 mg by mouth at bedtime.   aspirin EC 81 MG tablet Take 81 mg by mouth daily.   bisacodyl 10 MG suppository Commonly known as:  DULCOLAX Place 10 mg rectally daily as needed for moderate constipation.   calcium-vitamin D 500-200 MG-UNIT tablet Commonly known as:  OSCAL WITH D Take 1 tablet by mouth 2 (two) times daily.   Cranberry 425 MG Caps Take 425 mg by mouth 2 (two) times daily.   diltiazem 300 MG 24 hr capsule Commonly known as:  CARDIZEM CD Take 300 mg by mouth daily.   esomeprazole 20 MG capsule Commonly known as:  NEXIUM Take 20 mg by mouth 2 (two) times daily.   guaiFENesin 600 MG 12 hr tablet Commonly known as:  MUCINEX Take 1 tablet (  600 mg total) by mouth 2 (two) times daily. For 5days   hydrochlorothiazide 25 MG tablet Commonly known as:  HYDRODIURIL Take 1 tablet (25 mg total) by mouth every Monday, Wednesday, and Friday.   HYDROcodone-acetaminophen 5-325 MG tablet Commonly known as:  NORCO/VICODIN Take  1 tablet by mouth every 6 (six) hours as needed for moderate pain. For pain What changed:    when to take this  reasons to take this   hydroxypropyl methylcellulose / hypromellose 2.5 % ophthalmic solution Commonly known as:  ISOPTO TEARS / GONIOVISC Place 1 drop into both eyes 2 (two) times daily.   lisinopril 40 MG tablet Commonly known as:  PRINIVIL,ZESTRIL Take 40 mg by mouth daily.   lubiprostone 8 MCG capsule Commonly known as:  AMITIZA Take 8 mcg by mouth daily with breakfast.   methimazole 5 MG tablet Commonly known as:  TAPAZOLE Take 1 tablet (5 mg total) by mouth daily. What changed:  when to take this   MULTIVITAMIN GUMMIES ADULT PO Take 1 tablet by mouth daily.   mycophenolate 500 MG tablet Commonly known as:  CELLCEPT Take 1 tablet (500 mg total) by mouth 2 (two) times daily.   PARoxetine 40 MG tablet Commonly known as:  PAXIL Take 40 mg by mouth daily.   polyethylene glycol packet Commonly known as:  MIRALAX / GLYCOLAX Take 17 g by mouth daily. What changed:  when to take this   saccharomyces boulardii 250 MG capsule Commonly known as:  FLORASTOR Take 250 mg by mouth 2 (two) times daily.   sennosides-docusate sodium 8.6-50 MG tablet Commonly known as:  SENOKOT-S Take 1 tablet by mouth 2 (two) times daily.   spironolactone 25 MG tablet Commonly known as:  ALDACTONE Take 25 mg by mouth daily.   SUMAtriptan 25 MG tablet Commonly known as:  IMITREX Take 50 mg by mouth daily as needed for migraine. May repeat in 2 hours if headache persists or recurs.   traMADol 50 MG tablet Commonly known as:  ULTRAM Take by mouth every 12 (twelve) hours as needed.   traZODone 150 MG tablet Commonly known as:  DESYREL Take 75 mg by mouth at bedtime.   Vitamin D3 2000 units Tabs Take 2,000 Units by mouth daily.       Consultations:  Pulm  Discharge Exam: Vitals:   03/13/17 0933 03/13/17 0941  BP:    Pulse:    Resp:    Temp:    SpO2: 92% 92%     General: AAOx3 Cardiovascular: S1S2/RRR Respiratory: few basilar ronchi  Discharge Instructions   Discharge Instructions    Diet - low sodium heart healthy   Complete by:  As directed    Increase activity slowly   Complete by:  As directed      Allergies as of 03/13/2017   No Known Allergies       No Known Allergies  Contact information for follow-up providers    [place], [facility] Follow up.   Specialty:  Assisted Living Facility Why:  they provide their own HHPT/OT/aide/sw/respiratory care Contact information: [address] [place] [address] [phone]            Contact information for after-discharge care    Destination    [facility] Follow up.   Service:  Assisted Living Contact information: [name] [place] [address] [phone]  The results of significant diagnostics from this hospitalization (including imaging, microbiology, ancillary and laboratory) are listed below for reference.    Significant Diagnostic Studies: Dg Chest 2 View  Result Date: 03/04/2017 CLINICAL DATA:  Worsening cough and dyspnea. EXAM: CHEST  2 VIEW COMPARISON:  Chest CT 02/28/2016 and CXR 11/03/2016, lumbar spine radiographs from 11/03/2016. FINDINGS: Stable cardiomegaly with aortic atherosclerosis. Right paratracheal soft tissue prominence is stable, consistent with a tortuous ectatic right subclavian artery on prior chest CT. Minimal airspace opacity at the left lung base overlying the costophrenic angle may represent a tiny focus of pneumonia or atelectasis. Old left posterior seventh rib fracture. Vertebral augmentation T12 and L2 with chronic stable moderate compression of L1. IMPRESSION: 1. Cardiomegaly with aortic atherosclerosis. 2. Small focus of airspace opacity in the left lung base suspicious for atelectasis and/or pneumonia. 3. Vertebral augmentation T12 and L2 with  chronic moderate compression of L1. Electronically Signed   By: Tollie Ethavid  Kwon M.D.   On: 03/04/2017 22:57   Dg Chest Port 1 View  Result Date: 03/11/2017 CLINICAL DATA:  Follow-up pneumonia EXAM: PORTABLE CHEST 1 VIEW COMPARISON:  03/04/2017 FINDINGS: Progressive retrocardiac opacity. Borderline heart size. Aortic tortuosity. 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 No edema, effusion, or pneumothorax.  Right perihilar atelectasis. IMPRESSION: History of pneumonia with progressive retrocardiac opacification. Electronically Signed   By: Marnee SpringJonathon  Watts M.D.   On: 03/11/2017 15:23    Microbiology: Recent Results (from the past 240 hour(s))  Blood culture (routine x 2)     Status: None   Collection Time: 03/04/17 10:47 PM  Result Value Ref Range Status   Specimen Description BLOOD RIGHT HAND  Final   Special Requests   Final    BOTTLES DRAWN AEROBIC AND ANAEROBIC Blood Culture adequate volume   Culture   Final    NO GROWTH 5 DAYS Performed at [facility], [address] [place], [place] [address]    Report Status 03/10/2017 FINAL  Final  Blood culture (routine x 2)     Status: None   Collection Time: 03/04/17 10:47 PM  Result Value Ref Range Status   Specimen Description BLOOD LEFT ANTECUBITAL  Final   Special Requests   Final    BOTTLES DRAWN AEROBIC AND ANAEROBIC Blood Culture adequate volume   Culture   Final    NO GROWTH 5 DAYS Performed at [facility], [address] [place], [place] [address]    Report Status 03/10/2017 FINAL  Final  Respiratory Panel by PCR     Status: Abnormal   Collection Time: 03/05/17  3:26 PM  Result Value Ref Range Status   Adenovirus NOT DETECTED NOT DETECTED Final   Coronavirus 229E NOT DETECTED NOT DETECTED Final   Coronavirus HKU1 NOT DETECTED NOT DETECTED Final   Coronavirus NL63 NOT DETECTED NOT DETECTED Final   Coronavirus OC43 NOT DETECTED NOT DETECTED Final    Metapneumovirus NOT DETECTED NOT DETECTED Final   Rhinovirus / Enterovirus NOT DETECTED NOT DETECTED Final   Influenza A NOT DETECTED NOT DETECTED Final   Influenza B NOT DETECTED NOT DETECTED Final   Parainfluenza Virus 1 NOT DETECTED NOT DETECTED Final   Parainfluenza Virus 2 NOT DETECTED NOT DETECTED Final   Parainfluenza Virus 3 NOT DETECTED NOT DETECTED Final   Parainfluenza Virus 4 DETECTED (A) NOT DETECTED Final   Respiratory Syncytial Virus NOT DETECTED NOT DETECTED Final   Bordetella pertussis NOT DETECTED NOT DETECTED  Final   Chlamydophila pneumoniae NOT DETECTED NOT DETECTED Final   Mycoplasma pneumoniae NOT DETECTED NOT DETECTED Final    Comment: Performed at [facility], [address] [place], [place] [address]  MRSA PCR Screening     Status: None   Collection Time: 03/05/17  3:26 PM  Result Value Ref Range Status   MRSA by PCR NEGATIVE NEGATIVE Final    Comment:        The GeneXpert MRSA Assay (FDA approved for NASAL specimens only), is one component of a comprehensive MRSA colonization surveillance program. It is not intended to diagnose MRSA infection nor to guide or monitor treatment for MRSA infections.      Labs: Basic Metabolic Panel: Recent Labs  Lab 03/08/17 0517 03/09/17 0525 03/10/17 0521 03/11/17 0436 03/12/17 0453  NA 135 134* 136 133* 133*  K 4.5 4.3 4.4 4.2 4.8  CL 102 101 102 100* 100*  CO2 27 26 29 26 25   GLUCOSE 133* 133* 139* 158* 98  BUN 28* 34* 36* 37* 36*  CREATININE 0.82 0.66 0.72 0.62 0.75  CALCIUM 8.9 9.1 8.7* 8.5* 8.5*   Liver Function Tests: No results for input(s): AST, ALT, ALKPHOS, BILITOT, PROT, ALBUMIN in the last 168 hours. No results for input(s): LIPASE, AMYLASE in the last 168 hours. No results for input(s): AMMONIA in the last 168 hours. CBC: Recent Labs  Lab 03/07/17 0525 03/08/17 0517 03/09/17 0525 03/11/17 0436 03/12/17 0453  WBC 7.6 7.3 6.7 8.0 8.2  NEUTROABS 6.7 6.2 5.6  --   --   HGB 11.4*  11.2* 11.7* 12.1 12.0  HCT 35.8* 35.6* 36.6 37.8 38.3  MCV 90.9 91.3 91.3 91.5 91.8  PLT 167 158 186 162 173   Cardiac Enzymes: No results for input(s): CKTOTAL, CKMB, CKMBINDEX, TROPONINI in the last 168 hours. BNP: BNP (last 3 results) No results for input(s): BNP in the last 8760 hours.  ProBNP (last 3 results) No results for input(s): PROBNP in the last 8760 hours.  CBG: Recent Labs  Lab 03/08/17 1159 03/08/17 1639  GLUCAP 123* 163*       Signed:  Zannie Cove MD.  [facility] 03/13/2017, 11:02 AM

## 2017-03-13 NOTE — Progress Notes (Signed)
Report called to [place] [facility]. Awaiting [facility] to transport. Will continue to monitor.

## 2017-03-13 NOTE — NC FL2 (Signed)
[place] MEDICAID FL2 LEVEL OF CARE SCREENING TOOL     IDENTIFICATION  Patient Name: Paula Bird Birthdate: November 14, 1925 Sex: female Admission Date (Current Location): 03/04/2017  [facility] [place]   Facility and Address:  [facility],  [address], [place] [id number]      Provider Number: [id number]  Attending Physician Name and Address:  Zannie CoveJoseph, Preetha, MD  Relative Name and Phone Number:  Wynne DustGuy Harms, son, [phone]    Current Level of Care: Hospital Recommended Level of Care: Assisted Living Facility Prior Approval Number:    Date Approved/Denied:   PASRR Number:    Discharge Plan: Domiciliary (Rest home)(ALF)    Current Diagnoses: Patient Active Problem List   Diagnosis Date Noted  . Infection due to parainfluenza virus 4 03/06/2017  . Gait abnormality 06/07/2016  . Hyperthyroidism 04/24/2016  . Myasthenia gravis (HCC) 02/07/2016  . Ptosis of eyelid, right 01/31/2016  . Pain, eye, right 01/31/2016  . Falling 01/31/2016  . Vertigo 10/08/2015  . Colon polyp 08/30/2015  . Arthritis, degenerative 08/30/2015  . Acid indigestion 08/30/2015  . Psoriasis 08/30/2015  . Avitaminosis D 08/30/2015  . HCAP (healthcare-associated pneumonia) 08/08/2015  . Respiratory distress 08/08/2015  . Essential hypertension 08/08/2015  . COPD exacerbation (HCC) 08/08/2015  . Dyspnea 08/04/2015  . Hypertension 08/04/2015  . Depression 08/04/2015  . Insomnia 08/04/2015  . Obstructive chronic bronchitis with acute exacerbation (HCC) 08/04/2015  . Thrombocytopenia (HCC) 08/04/2015  . Paroxysmal atrial fibrillation (HCC) 08/04/2015  . Acute exacerbation of chronic obstructive bronchitis (HCC) 08/04/2015  . Infection of urinary tract 07/01/2014  . Bradycardia 11/24/2013  . Fall in home 11/22/2013  . Pulmonary hypertension (HCC) 11/19/2013  . Excessive falling 09/07/2012  . Encounter for general adult medical examination without abnormal  findings 03/24/2012  . Atrial paroxysmal tachycardia (HCC) 10/05/2011  . Family history of colon cancer 05/15/2011  . Mass of pelvis 05/15/2011    Orientation RESPIRATION BLADDER Height & Weight     Self, Time, Situation, Place  O2(nasal cannula 2L) Incontinent Weight: 145 lb 1 oz (65.8 kg) Height:  5' (152.4 cm)  BEHAVIORAL SYMPTOMS/MOOD NEUROLOGICAL BOWEL NUTRITION STATUS      Continent Diet   regular  AMBULATORY STATUS COMMUNICATION OF NEEDS Skin   Limited Assist Verbally Normal                       Personal Care Assistance Level of Assistance  Bathing, Feeding, Dressing Bathing Assistance: Limited assistance Feeding assistance: Independent Dressing Assistance: Limited assistance     Functional Limitations Info  Sight, Hearing, Speech Sight Info: Adequate Hearing Info: Adequate Speech Info: Adequate    SPECIAL CARE FACTORS FREQUENCY  PT (By licensed PT)     HHPT; HHOT              Contractures Contractures Info: Not present    Additional Factors Info  Code Status, Allergies, Psychotropic Code Status Info: DNR Allergies Info: No Known Allergies Psychotropic Info: ativan, paxil, seroquel, trazodone            Discharge Medications:  Medication List     STOP taking these medications   hydrALAZINE 25 MG tablet Commonly known as:  APRESOLINE   levofloxacin 500 MG tablet Commonly known as:  LEVAQUIN   meloxicam 15 MG tablet Commonly known as:  MOBIC   QUEtiapine 25 MG tablet Commonly known as:  SEROQUEL     TAKE these medications   amiodarone 200 MG tablet  Commonly known as:  PACERONE Take 200 mg by mouth daily.   amitriptyline 75 MG tablet Commonly known as:  ELAVIL Take 75 mg by mouth at bedtime.   aspirin EC 81 MG tablet Take 81 mg by mouth daily.   bisacodyl 10 MG suppository Commonly known as:  DULCOLAX Place 10 mg rectally daily as needed for moderate constipation.   calcium-vitamin D 500-200 MG-UNIT  tablet Commonly known as:  OSCAL WITH D Take 1 tablet by mouth 2 (two) times daily.   Cranberry 425 MG Caps Take 425 mg by mouth 2 (two) times daily.   diltiazem 300 MG 24 hr capsule Commonly known as:  CARDIZEM CD Take 300 mg by mouth daily.   esomeprazole 20 MG capsule Commonly known as:  NEXIUM Take 20 mg by mouth 2 (two) times daily.   guaiFENesin 600 MG 12 hr tablet Commonly known as:  MUCINEX Take 1 tablet (600 mg total) by mouth 2 (two) times daily. For 5days   hydrochlorothiazide 25 MG tablet Commonly known as:  HYDRODIURIL Take 1 tablet (25 mg total) by mouth every Monday, Wednesday, and Friday.   HYDROcodone-acetaminophen 5-325 MG tablet Commonly known as:  NORCO/VICODIN Take 1 tablet by mouth every 6 (six) hours as needed for moderate pain. For pain What changed:    when to take this  reasons to take this   hydroxypropyl methylcellulose / hypromellose 2.5 % ophthalmic solution Commonly known as:  ISOPTO TEARS / GONIOVISC Place 1 drop into both eyes 2 (two) times daily.   lisinopril 40 MG tablet Commonly known as:  PRINIVIL,ZESTRIL Take 40 mg by mouth daily.   lubiprostone 8 MCG capsule Commonly known as:  AMITIZA Take 8 mcg by mouth daily with breakfast.   methimazole 5 MG tablet Commonly known as:  TAPAZOLE Take 1 tablet (5 mg total) by mouth daily. What changed:  when to take this   MULTIVITAMIN GUMMIES ADULT PO Take 1 tablet by mouth daily.   mycophenolate 500 MG tablet Commonly known as:  CELLCEPT Take 1 tablet (500 mg total) by mouth 2 (two) times daily.   PARoxetine 40 MG tablet Commonly known as:  PAXIL Take 40 mg by mouth daily.   polyethylene glycol packet Commonly known as:  MIRALAX / GLYCOLAX Take 17 g by mouth daily. What changed:  when to take this   saccharomyces boulardii 250 MG capsule Commonly known as:  FLORASTOR Take 250 mg by mouth 2 (two) times daily.   sennosides-docusate sodium 8.6-50 MG  tablet Commonly known as:  SENOKOT-S Take 1 tablet by mouth 2 (two) times daily.   spironolactone 25 MG tablet Commonly known as:  ALDACTONE Take 25 mg by mouth daily.   SUMAtriptan 25 MG tablet Commonly known as:  IMITREX Take 50 mg by mouth daily as needed for migraine. May repeat in 2 hours if headache persists or recurs.   traMADol 50 MG tablet Commonly known as:  ULTRAM Take by mouth every 12 (twelve) hours as needed.   traZODone 150 MG tablet Commonly known as:  DESYREL Take 75 mg by mouth at bedtime.   Vitamin D3 2000 units Tabs Take 2,000 Units by mouth daily.       Relevant Imaging Results:  Relevant Lab Results:   Additional Information SSN: [id number]  Antionette PolesKimberly L Hasini Peachey, LCSW

## 2017-03-13 NOTE — Progress Notes (Signed)
Best of 3 attempts on:  NIF =-35   FVC= approx 1L

## 2017-03-13 NOTE — Progress Notes (Signed)
Patient returning [facility]. CSW sent clinical documents, staff confirmed receipt and patient's ability to return. Staff member [place]any reported that patient's oxygen was delivered and that patient can arrive at ALF. PTAR contacted, patient's family notified. Patient's RN can call report to [phone], packet complete. CSW signing off, no other needs identified at this time.   Celso SickleKimberly Chaunta Bejarano, [place] Clinical Social Worker [facility] Cell#: [phone]

## 2017-03-13 NOTE — Progress Notes (Signed)
Pt to [facility] with [facility] health services. Pt also to have new home 02. 02 order received and desaturation screen done. [facility]  Sandford Crazeora Juanna Pudlo RN,BSN,NCM [phone]

## 2017-04-02 ENCOUNTER — Ambulatory Visit: Payer: Medicare Other | Admitting: Endocrinology

## 2017-04-17 NOTE — Progress Notes (Signed)
[facility]  PATIENT: Paula Bird DOB: 22-Dec-1925   REASON FOR VISIT: Follow-up for myasthenia gravis HISTORY FROM: Patient and daughter Paula Bird    HISTORY OF PRESENT ILLNESS:Paula Bird is 84 year old right-handed female, accompanied by her daughter Paula Bird, seen in refer by primary care physician Dr. Merlene Laughter for evaluation of eye pain, right eye swelling, initial evaluation was on January 31 2016.  I reviewed and summarized the referring note, she had a history of hypertension, paroxysmal atrial fibrillation, depression, obsessive-compulsive disorder, acid reflux, polypharmacy treatment, also had a history of left knee, left hip replacement,  She used to lives alone at home, but began to suffer multiple falls around 2015, has moved to [facility] since 2016.  She was noted to have right side droopy eyelid swelling since September 2017, gradually getting worse, light sensitivity, she had chronic headache, tends to stay at the right side, with her new right eye symptoms, she noticed more right behind eye pain, she was seen by optometrist, was given eyedrops of unknown name, with only mild improvement. Is difficulty for her to open her right eye,  She denies rash broke out, she denies hearing loss, no dysarthria, no dysphagia  Laboratory evaluation in September 2017, normal CMP with creatinine 0.61, normal CBC, with hemoglobin of 12.2, TSH was decreased to 0.008 with high free T3 8.3, free thyroxine1.50,  Update February 07 2016: She was diagnosed with seropositive generalized myasthenia gravis, bulbar predominant,  Laboratory evaluation showed positive acetylcholine binding antibody 8.3, blocking antibody 39, anti-striation antibodies 1:160, normal CPK 21, C-reactive protein, ESR, folic acid, vitamin S85, mild elevated free T3 4.Normal free T4, decreased TSH less than 0.01,  She continue have significant right ptosis,  intermittent double vision, denies swallowing difficulty, gait abnormality she attributed to left hip and knee pain,  UPDATE Dec 7th 2017: She was seen by endocrinologist Dr. 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 Einar Gip showed no significant abnormality She is able to tolerate prednisone, on tapering dose, CellCept 500 mg twice a day, Mestinon 3 times a day CT chest showed multiple bilateral lung nodule, no thymus pathology noticed, there was aorta 4 cm aneurysm  UPDATE June 07 2016: Seropositive generalized myasthenia gravis She can open her right eye much better, denied double vision, continue have gait difficulty, but sounds like a chronic problem, reviewed her medication list, she is taking CellCept 500 mg twice a day, no longer taking prednisone, and Mestinon 60 mg 3 times a day,  UPDATE October 16 2016:YY Was able to review laboratory evaluations, normal TSH, free T4 was mildly elevated 0.5, negative ANA, there is no significant change in her myasthenia gravis, she has no double vision, no droopy eyelid, able to tolerate CellCept 500 mg twice a day She complains of worsening anxiety , depression, feel anxious, shortness of breath UPDATE 1/24/2019CM Paula Bird 84 year old female returns for follow-up with her daughter with history of myasthenia gravis.  Currently on CellCept 500 mg twice daily.  She has no droopy eyelid, occasional double vision fatigued trying to watch TV late in the day.  She had an admission to the hospital in December for community-acquired pneumonia she remains weak.  She just started her physical therapy this week.  She resides in an assisted living.  Reviewed recent labs from the hospital BMP and  CBC.  She had ER visit  after fall at facility, she was taken off of trazodone and amitriptyline.  She returns  for reevaluation   REVIEW OF SYSTEMS: Full 14 system review of systems performed and notable only for those listed, all others are neg:  Constitutional: Fatigue Cardiovascular: neg Ear/Nose/Throat: Hearing loss Skin: neg Eyes: Blurred vision, light sensitivity Respiratory: neg Gastroitestinal: neg  Hematology/Lymphatic: neg  Endocrine: neg Musculoskeletal: Walking difficulty Allergy/Immunology: neg Neurological: neg Psychiatric: Depression Sleep : neg   ALLERGIES: No Known Allergies  HOME MEDICATIONS: Outpatient Medications Prior to Visit  Medication Sig Dispense Refill  . amiodarone (PACERONE) 200 MG tablet Take 200 mg by mouth daily.     Marland Kitchen amitriptyline (ELAVIL) 50 MG tablet 50 mg at bedtime.  0  . aspirin EC 81 MG tablet Take 81 mg by mouth daily.    . bisacodyl (DULCOLAX) 10 MG suppository Place 10 mg rectally daily as needed for moderate constipation.    . calcium-vitamin D (OSCAL WITH D) 500-200 MG-UNIT tablet Take 1 tablet by mouth 2 (two) times daily.    . Cholecalciferol (VITAMIN D3) 2000 units TABS Take 2,000 Units by mouth daily.     . Cranberry 425 MG CAPS Take 425 mg by mouth 2 (two) times daily.    Marland Kitchen diltiazem (CARDIZEM CD) 300 MG 24 hr capsule Take 300 mg by mouth daily.  10  . esomeprazole (NEXIUM) 20 MG capsule Take 20 mg by mouth 2 (two) times daily.     . hydrochlorothiazide (HYDRODIURIL) 25 MG tablet Take 1 tablet (25 mg total) by mouth every Monday, Wednesday, and Friday. 30 tablet 0  . HYDROcodone-acetaminophen (NORCO/VICODIN) 5-325 MG tablet Take 1 tablet by mouth every 6 (six) hours as needed for moderate pain. For pain  0  . hydroxypropyl methylcellulose / hypromellose (ISOPTO TEARS / GONIOVISC) 2.5 % ophthalmic solution Place 1 drop into both eyes 2 (two) times daily.    Marland Kitchen lisinopril (PRINIVIL,ZESTRIL) 40 MG tablet Take 40 mg by mouth daily.  10  . lubiprostone (AMITIZA) 8 MCG capsule Take 8 mcg by mouth daily with breakfast.    . methimazole  (TAPAZOLE) 5 MG tablet Take 1 tablet (5 mg total) by mouth daily. (Patient taking differently: Take 5 mg by mouth at bedtime. ) 30 tablet 11  . Multiple Vitamins-Minerals (MULTIVITAMIN GUMMIES ADULT PO) Take 1 tablet by mouth daily.    . mycophenolate (CELLCEPT) 500 MG tablet Take 1 tablet (500 mg total) by mouth 2 (two) times daily. 60 tablet 11  . PARoxetine (PAXIL) 40 MG tablet Take 40 mg by mouth daily.  3  . polyethylene glycol (MIRALAX / GLYCOLAX) packet Take 17 g by mouth daily. (Patient taking differently: Take 17 g by mouth 2 (two) times daily. ) 14 each 12  . saccharomyces boulardii (FLORASTOR) 250 MG capsule Take 250 mg by mouth 2 (two) times daily.    . sennosides-docusate sodium (SENOKOT-S) 8.6-50 MG tablet Take 1 tablet by mouth 2 (two) times daily. 30 tablet 0  . spironolactone (ALDACTONE) 25 MG tablet Take 25 mg by mouth daily.    . SUMAtriptan (IMITREX) 25 MG tablet Take 50 mg by mouth daily as needed for migraine. May repeat in 2 hours if headache persists or recurs.    Marland Kitchen zolpidem (AMBIEN) 5 MG tablet 5 mg at bedtime.  0  . amitriptyline (ELAVIL) 75 MG tablet Take 75 mg by mouth at bedtime.  10  . guaiFENesin (MUCINEX) 600 MG 12 hr tablet Take 1 tablet (600 mg total) by mouth  2 (two) times daily. For 5days (Patient not taking: Reported on 04/18/2017)    . traMADol (ULTRAM) 50 MG tablet Take by mouth every 12 (twelve) hours as needed.    . traZODone (DESYREL) 150 MG tablet Take 75 mg by mouth at bedtime.      No facility-administered medications prior to visit.     PAST MEDICAL HISTORY: Past Medical History:  Diagnosis Date  . Acute and chronic respiratory failure, unspecified whether with hypoxia or hypercapnia ([place])   . Afib ([place])   . Allergic rhinitis   . Anemia   . CKD (chronic kidney disease)   . Constipation   . COPD (chronic obstructive pulmonary disease) ([place])   . Depression   . Diplopia   . Dysphagia   . Edema, lower extremity   . Esophageal dysmotility   .  Gait instability   . GERD (gastroesophageal reflux disease)   . Hypertension   . Hypothyroidism   . IBS (irritable bowel syndrome)   . IDA (iron deficiency anemia)   . Insomnia   . Leukocytosis   . Myasthenia gravis ([place])   . Oral thrush   . PAF (paroxysmal atrial fibrillation) ([place])   . Physical deconditioning   . Pneumonia 02/2017  . Protein calorie malnutrition ([place])   . Thyroid disease   . Thyroid mass   . Vertigo     PAST SURGICAL HISTORY: Past Surgical History:  Procedure Laterality Date  . ABDOMINAL HYSTERECTOMY    . APPENDECTOMY    . TOTAL HIP ARTHROPLASTY Left   . TOTAL KNEE ARTHROPLASTY Left     FAMILY HISTORY: Family History  Problem Relation Age of Onset  . Hypertension Mother   . Hypertension Son   . Diabetes Neg Hx   . Thyroid disease Neg Hx     SOCIAL HISTORY: Social History   Socioeconomic History  . Marital status: Widowed    Spouse name: Not on file  . Number of children: 2  . Years of education: HS  . Highest education level: Not on file  Social Needs  . Financial resource strain: Not on file  . Food insecurity - worry: Not on file  . Food insecurity - inability: Not on file  . Transportation needs - medical: Not on file  . Transportation needs - non-medical: Not on file  Occupational History  . Occupation: Retired  Tobacco Use  . Smoking status: Never Smoker  . Smokeless tobacco: Never Used  Substance and Sexual Activity  . Alcohol use: No  . Drug use: No  . Sexual activity: Not on file  Other Topics Concern  . Not on file  Social History Narrative   Resides at [facility].   Left-handed.   2 cups caffeine per day.     PHYSICAL EXAM  Vitals:   04/18/17 1419  BP: (!) 104/54  Pulse: 73   There is no height or weight on file to calculate BMI.  Generalized: Well developed, in no acute distress  Head: normocephalic and atraumatic,. Oropharynx benign  Neck: Supple,  Musculoskeletal: No deformity    Neurological examination   Mentation: Alert oriented to time, place, history taking. Attention span and concentration appropriate. Recent and remote memory intact.  Follows all commands speech and language fluent.   Cranial nerve II-XII: Pupils were equal round reactive to light extraocular movements were full, visual field were full on confrontational test.No ptosis.  But intermittent right ptosis when fatigued.  Facial sensation and strength were normal. hearing was  intact to finger rubbing bilaterally. Uvula tongue midline. head turning and shoulder shrug were normal and symmetric.Tongue protrusion into cheek strength was normal. Motor: normal bulk and tone, full strength in the BUE, bilateral hip flexion weakness  Sensory: normal and symmetric to light touch, pinprick, and  Vibration, in the upper and lower extremities Coordination: finger-nose-finger, heel-to-shin bilaterally, no dysmetria Reflexes: Brachioradialis 2/2, biceps 2/2, triceps 2/2, patellar 2/2, Achilles 2/2, plantar responses were flexor bilaterally. Gait and Station: Not ambulated in wheelchair DIAGNOSTIC DATA (LABS, IMAGING, TESTING) - I reviewed patient records, labs, notes, testing and imaging myself where available.  Lab Results  Component Value Date   WBC 8.2 03/12/2017   HGB 12.0 03/12/2017   HCT 38.3 03/12/2017   MCV 91.8 03/12/2017   PLT 173 03/12/2017      Component Value Date/Time   NA 133 (L) 03/12/2017 0453   NA 141 10/16/2016 1429   K 4.8 03/12/2017 0453   CL 100 (L) 03/12/2017 0453   CO2 25 03/12/2017 0453   GLUCOSE 98 03/12/2017 0453   BUN 36 (H) 03/12/2017 0453   BUN 15 10/16/2016 1429   CREATININE 0.75 03/12/2017 0453   CALCIUM 8.5 (L) 03/12/2017 0453   PROT 5.9 (L) 03/06/2017 0415   PROT 6.6 10/16/2016 1429   ALBUMIN 3.6 03/06/2017 0415   ALBUMIN 4.1 10/16/2016 1429   AST 27 03/06/2017 0415   ALT 43 03/06/2017 0415   ALKPHOS 40 03/06/2017 0415   BILITOT 0.5 03/06/2017 0415   BILITOT  <0.2 10/16/2016 1429   GFRNONAA >60 03/12/2017 0453   GFRAA >60 03/12/2017 0453    Lab Results  Component Value Date   HGBA1C 6.0 (H) 08/11/2015   Lab Results  Component Value Date   VITAMINB12 374 01/31/2016   Lab Results  Component Value Date   TSH 2.95 11/27/2016      ASSESSMENT AND PLAN  Paula Bird is a 84 y.o. female   with seropositive myasthenia gravis with ocular involvement.  Recent admission for pneumonia in December.  Gait abnormality which is multifactorial to include deconditioning pain lumbar degenerative disease, at risk for falls.  She also has anxiety depression currently on Paxil.     PLAN: Continue CellCept 500 mg twice daily Continue physical therapy for deconditioning use walker at all times for safe ambulation Can use eye patch for double vision which is intermittent  Have glasses rechecked Reviewed recent labs from hospitalization CBC and BMP Follow-up in 6 months Dennie Bible, [facility], [facility], [facility] [address] [place], [place] [address] [phone]

## 2017-04-18 ENCOUNTER — Ambulatory Visit: Payer: Medicare Other | Admitting: Nurse Practitioner

## 2017-04-18 ENCOUNTER — Encounter: Payer: Self-pay | Admitting: Nurse Practitioner

## 2017-04-18 VITALS — BP 104/54 | HR 73

## 2017-04-18 DIAGNOSIS — R269 Unspecified abnormalities of gait and mobility: Secondary | ICD-10-CM | POA: Diagnosis not present

## 2017-04-18 DIAGNOSIS — G7000 Myasthenia gravis without (acute) exacerbation: Secondary | ICD-10-CM

## 2017-04-18 NOTE — Patient Instructions (Signed)
Continue CellCept 500 mg twice daily Continue physical therapy for deconditioning use walker at all times for safe ambulation Can use eye patch for double vision which is intermittent Reviewed recent labs from hospitalization Follow-up in 6 months

## 2017-04-22 ENCOUNTER — Emergency Department (HOSPITAL_COMMUNITY)
Admission: EM | Admit: 2017-04-22 | Discharge: 2017-04-23 | Disposition: A | Discharge status: Skilled Nursing Facility | Payer: Medicare Other | Attending: Emergency Medicine | Admitting: Emergency Medicine

## 2017-04-22 ENCOUNTER — Encounter (HOSPITAL_COMMUNITY): Payer: Self-pay

## 2017-04-22 ENCOUNTER — Emergency Department (HOSPITAL_COMMUNITY): Payer: Medicare Other

## 2017-04-22 DIAGNOSIS — Z79899 Other long term (current) drug therapy: Secondary | ICD-10-CM | POA: Insufficient documentation

## 2017-04-22 DIAGNOSIS — E039 Hypothyroidism, unspecified: Secondary | ICD-10-CM | POA: Insufficient documentation

## 2017-04-22 DIAGNOSIS — N189 Chronic kidney disease, unspecified: Secondary | ICD-10-CM | POA: Diagnosis not present

## 2017-04-22 DIAGNOSIS — I129 Hypertensive chronic kidney disease with stage 1 through stage 4 chronic kidney disease, or unspecified chronic kidney disease: Secondary | ICD-10-CM | POA: Insufficient documentation

## 2017-04-22 DIAGNOSIS — J40 Bronchitis, not specified as acute or chronic: Secondary | ICD-10-CM

## 2017-04-22 DIAGNOSIS — R0602 Shortness of breath: Secondary | ICD-10-CM | POA: Diagnosis not present

## 2017-04-22 DIAGNOSIS — R05 Cough: Secondary | ICD-10-CM | POA: Diagnosis present

## 2017-04-22 DIAGNOSIS — Z7982 Long term (current) use of aspirin: Secondary | ICD-10-CM | POA: Diagnosis not present

## 2017-04-22 DIAGNOSIS — J449 Chronic obstructive pulmonary disease, unspecified: Secondary | ICD-10-CM | POA: Insufficient documentation

## 2017-04-22 LAB — BASIC METABOLIC PANEL
Anion gap: 7 (ref 5–15)
BUN: 25 mg/dL — ABNORMAL HIGH (ref 6–20)
CALCIUM: 8.9 mg/dL (ref 8.9–10.3)
CHLORIDE: 105 mmol/L (ref 101–111)
CO2: 25 mmol/L (ref 22–32)
CREATININE: 0.75 mg/dL (ref 0.44–1.00)
GFR calc Af Amer: >60 mL/min (ref >60)
Glucose, Bld: 112 mg/dL — ABNORMAL HIGH (ref 65–99)
POTASSIUM: 4.3 mmol/L (ref 3.5–5.1)
Sodium: 137 mmol/L (ref 135–145)

## 2017-04-22 LAB — CBC
HCT: 37.3 % (ref 36.0–46.0)
HEMOGLOBIN: 12 g/dL (ref 12.0–15.0)
MCH: 30.1 pg (ref 26.0–34.0)
MCHC: 32.2 g/dL (ref 30.0–36.0)
MCV: 93.5 fL (ref 78.0–100.0)
Platelets: 151 10*3/uL (ref 150–400)
RBC: 3.99 MIL/uL (ref 3.87–5.11)
RDW: 14.3 % (ref 11.5–15.5)
WBC: 6.5 10*3/uL (ref 4.0–10.5)

## 2017-04-22 LAB — BRAIN NATRIURETIC PEPTIDE: B NATRIURETIC PEPTIDE 5: 29.1 pg/mL (ref 0.0–100.0)

## 2017-04-22 MED ORDER — BENZONATATE 100 MG PO CAPS: 100.0000 mg | ORAL_CAPSULE | Freq: Three times a day (TID) | ORAL | 0 refills | Status: DC

## 2017-04-22 MED ORDER — ALBUTEROL SULFATE (2.5 MG/3ML) 0.083% IN NEBU
2.5000 mg | INHALATION_SOLUTION | Freq: Once | RESPIRATORY_TRACT | Status: AC
  Administered 2017-04-22: 2.5 mg via RESPIRATORY_TRACT
  Filled 2017-04-22: qty 3

## 2017-04-22 NOTE — Discharge Instructions (Signed)
The medication as needed for cough.  Follow-up with your primary care doctor to make sure your symptoms are improving.

## 2017-04-22 NOTE — ED Notes (Signed)
Bed: [id number] Expected date:  Expected time:  Means of arrival:  Comments: 84 yo F/Cough-ronchi

## 2017-04-22 NOTE — ED Triage Notes (Signed)
Patient BIB [facility] [employer]. Patient reports a productive cough for 1 week with no SOB, or fever.

## 2017-04-22 NOTE — ED Provider Notes (Signed)
[facility]-EMERGENCY DEPT Provider Note   CSN: [id number] Arrival date & time: 04/22/17  1935     History   Chief Complaint No chief complaint on file.   HPI Paula Bird is a 84 y.o. female.  HPI Pt was in the hospital recently for flu and pneumonia.  She was discharged on 12/19 after being admitted on 12/10.   Pt is worried that she is getting pneumonia again.  She is coughing up clear mucus.  She is feeling weak.  These sx started again last week.  No fevers.  She is feeling short of breath.   Past Medical History:  Diagnosis Date  . Acute and chronic respiratory failure, unspecified whether with hypoxia or hypercapnia (HCC)   . Afib (HCC)   . Allergic rhinitis   . Anemia   . CKD (chronic kidney disease)   . Constipation   . COPD (chronic obstructive pulmonary disease) (HCC)   . Depression   . Diplopia   . Dysphagia   . Edema, lower extremity   . Esophageal dysmotility   . Gait instability   . GERD (gastroesophageal reflux disease)   . Hypertension   . Hypothyroidism   . IBS (irritable bowel syndrome)   . IDA (iron deficiency anemia)   . Insomnia   . Leukocytosis   . Myasthenia gravis (HCC)   . Oral thrush   . PAF (paroxysmal atrial fibrillation) (HCC)   . Physical deconditioning   . Pneumonia 02/2017  . Protein calorie malnutrition (HCC)   . Thyroid disease   . Thyroid mass   . Vertigo     Patient Active Problem List   Diagnosis Date Noted  . Infection due to parainfluenza virus 4 03/06/2017  . Gait abnormality 06/07/2016  . Hyperthyroidism 04/24/2016  . Myasthenia gravis (HCC) 02/07/2016  . Ptosis of eyelid, right 01/31/2016  . Pain, eye, right 01/31/2016  . Falling 01/31/2016  . Vertigo 10/08/2015  . Colon polyp 08/30/2015  . Arthritis, degenerative 08/30/2015  . Acid indigestion 08/30/2015  . Psoriasis 08/30/2015  . Avitaminosis D 08/30/2015  . HCAP (healthcare-associated pneumonia) 08/08/2015  . Respiratory distress  08/08/2015  . Essential hypertension 08/08/2015  . COPD exacerbation (HCC) 08/08/2015  . Dyspnea 08/04/2015  . Hypertension 08/04/2015  . Depression 08/04/2015  . Insomnia 08/04/2015  . Obstructive chronic bronchitis with acute exacerbation (HCC) 08/04/2015  . Thrombocytopenia (HCC) 08/04/2015  . Paroxysmal atrial fibrillation (HCC) 08/04/2015  . Acute exacerbation of chronic obstructive bronchitis (HCC) 08/04/2015  . Infection of urinary tract 07/01/2014  . Bradycardia 11/24/2013  . Fall in home 11/22/2013  . Pulmonary hypertension (HCC) 11/19/2013  . Excessive falling 09/07/2012  . Encounter for general adult medical examination without abnormal findings 03/24/2012  . Atrial paroxysmal tachycardia (HCC) 10/05/2011  . Family history of colon cancer 05/15/2011  . Mass of pelvis 05/15/2011    Past Surgical History:  Procedure Laterality Date  . ABDOMINAL HYSTERECTOMY    . APPENDECTOMY    . TOTAL HIP ARTHROPLASTY Left   . TOTAL KNEE ARTHROPLASTY Left     OB History    No data available       Home Medications    Prior to Admission medications   Medication Sig Start Date End Date Taking? Authorizing Provider  aspirin EC 81 MG tablet Take 81 mg by mouth daily.    [provider]  benzonatate (TESSALON) 100 MG capsule Take 1 capsule (100 mg total) by mouth every 8 (eight) hours. 04/22/17  Linwood Dibbles, MD  bisacodyl (DULCOLAX) 10 MG suppository Place 10 mg rectally daily as needed for moderate constipation.    [provider]  calcium-vitamin D (OSCAL WITH D) 500-200 MG-UNIT tablet Take 1 tablet by mouth 2 (two) times daily.    [provider]  Cholecalciferol (VITAMIN D3) 2000 units TABS Take 2,000 Units by mouth daily.     [provider]  Cranberry 425 MG CAPS Take 425 mg by mouth 2 (two) times daily.    [provider]  diltiazem (CARDIZEM CD) 300 MG 24 hr capsule Take 300 mg by mouth daily. 03/04/16   [provider]    esomeprazole (NEXIUM) 20 MG capsule Take 20 mg by mouth 2 (two) times daily.     [provider]  guaiFENesin (MUCINEX) 600 MG 12 hr tablet Take 1 tablet (600 mg total) by mouth 2 (two) times daily. For 5days Patient not taking: Reported on 04/18/2017 03/13/17   Zannie Cove, MD  hydrochlorothiazide (HYDRODIURIL) 25 MG tablet Take 1 tablet (25 mg total) by mouth every Monday, Wednesday, and Friday. 08/16/15   [name], MD  HYDROcodone-acetaminophen (NORCO/VICODIN) 5-325 MG tablet Take 1 tablet by mouth every 6 (six) hours as needed for moderate pain. For pain 03/13/17   Zannie Cove, MD  hydroxypropyl methylcellulose / hypromellose (ISOPTO TEARS / GONIOVISC) 2.5 % ophthalmic solution Place 1 drop into both eyes 2 (two) times daily.    [provider]  lisinopril (PRINIVIL,ZESTRIL) 40 MG tablet Take 40 mg by mouth daily. 03/04/16   [provider]  lubiprostone (AMITIZA) 8 MCG capsule Take 8 mcg by mouth daily with breakfast.    [provider]  methimazole (TAPAZOLE) 5 MG tablet Take 1 tablet (5 mg total) by mouth daily. Patient taking differently: Take 5 mg by mouth at bedtime.  07/24/16   Romero Belling, MD  Multiple Vitamins-Minerals (MULTIVITAMIN GUMMIES ADULT PO) Take 1 tablet by mouth daily.    [provider]  mycophenolate (CELLCEPT) 500 MG tablet Take 1 tablet (500 mg total) by mouth 2 (two) times daily. 10/16/16   Levert Feinstein, MD  PARoxetine (PAXIL) 40 MG tablet Take 40 mg by mouth daily. 07/23/15   [provider]  polyethylene glycol (MIRALAX / GLYCOLAX) packet Take 17 g by mouth daily. Patient taking differently: Take 17 g by mouth 2 (two) times daily.  08/27/14   Harris, Abigail, PA-C  saccharomyces boulardii (FLORASTOR) 250 MG capsule Take 250 mg by mouth 2 (two) times daily.    [provider]  sennosides-docusate sodium (SENOKOT-S) 8.6-50 MG tablet Take 1 tablet by mouth 2 (two) times daily. 08/16/15   [name], MD   spironolactone (ALDACTONE) 25 MG tablet Take 25 mg by mouth daily.    [provider]  SUMAtriptan (IMITREX) 25 MG tablet Take 50 mg by mouth daily as needed for migraine. May repeat in 2 hours if headache persists or recurs.    [provider]  zolpidem (AMBIEN) 5 MG tablet 5 mg at bedtime. 03/29/17   [provider]    Family History Family History  Problem Relation Age of Onset  . Hypertension Mother   . Hypertension Son   . Diabetes Neg Hx   . Thyroid disease Neg Hx     Social History Social History   Tobacco Use  . Smoking status: Never Smoker  . Smokeless tobacco: Never Used  Substance Use Topics  . Alcohol use: No  . Drug use: No  Allergies   Patient has no known allergies.   Review of Systems Review of Systems  All other systems reviewed and are negative.    Physical Exam Updated Vital Signs BP 132/67 (BP Location: Left Arm)   Pulse (!) 56   Temp 98.2 F (36.8 C) (Oral)   Resp 13   SpO2 95%   Physical Exam  Constitutional: No distress.  Elderly frail   HENT:  Head: Normocephalic and atraumatic.  Right Ear: External ear normal.  Left Ear: External ear normal.  Eyes: Conjunctivae are normal. Right eye exhibits no discharge. Left eye exhibits no discharge. No scleral icterus.  Neck: Neck supple. No tracheal deviation present.  Cardiovascular: Normal rate, regular rhythm and intact distal pulses.  Pulmonary/Chest: Effort normal. No stridor. No respiratory distress. She has no wheezes. She has rhonchi. She has no rales.  Abdominal: Soft. Bowel sounds are normal. She exhibits no distension. There is no tenderness. There is no rebound and no guarding.  Musculoskeletal: She exhibits no edema or tenderness.  Neurological: She is alert. She has normal strength. No cranial nerve deficit (no facial droop, extraocular movements intact, no slurred speech) or sensory deficit. She exhibits normal muscle tone. She displays no seizure  activity. Coordination normal.  Skin: Skin is warm and dry. No rash noted.  Psychiatric: She has a normal mood and affect.  Nursing note and vitals reviewed.    ED Treatments / Results  Labs (all labs ordered are listed, but only abnormal results are displayed) Labs Reviewed  BASIC METABOLIC PANEL - Abnormal; Notable for the following components:      Result Value   Glucose, Bld 112 (*)    BUN 25 (*)    All other components within normal limits  CBC  BRAIN NATRIURETIC PEPTIDE    EKG  EKG Interpretation  Date/Time:  Monday April 22 2017 20:00:48 EST Ventricular Rate:  57 PR Interval:    QRS Duration: 87 QT Interval:  417 QTC Calculation: 406 R Axis:   -7 Text Interpretation:  Sinus rhythm with first degree AV block No significant change since last tracing Confirmed by Linwood Dibbles [phone]) on 04/22/2017 8:05:40 PM       Radiology Dg Chest 2 View  Result Date: 04/22/2017 CLINICAL DATA:  84 year old female with history of cough and shortness of breath for 1 week. EXAM: CHEST  2 VIEW COMPARISON:  Chest x-ray 03/11/2017. FINDINGS: Lung volumes are normal. No consolidative airspace disease. No pleural effusions. No definite suspicious appearing pulmonary nodules or masses are noted. Areas of linear scarring are again evident in the lingula and right lower lobe. No evidence of pulmonary edema. Heart size is borderline enlarged. Upper mediastinal contours are within normal limits with prominence of the right paratracheal soft tissues which corresponds to a tortuous innominate artery on prior chest CT 02/28/2016. Aortic atherosclerosis. Post vertebroplasty changes in the lower thoracic and upper lumbar spine with chronic compression fracture of what appears to be T12 with 30% loss of anterior vertebral body height, and old T8 compression fracture of superior endplate with 15% loss of anterior vertebral body height. Old healed fractures of the posterolateral aspect of the left seventh and  eighth ribs. IMPRESSION: 1. No radiographic evidence of acute cardiopulmonary disease. 2. Areas of mild chronic scarring in the lung bases bilaterally, similar to prior studies. 3. Aortic atherosclerosis. 4. Old healed left-sided rib fractures and vertebral body compression fractures with post vertebroplasty changes, similar prior studies, as above. Electronically Signed   By: Reuel Boom  Entrikin M.D.   On: 04/22/2017 20:48    Procedures Procedures (including critical care time)  Medications Ordered in ED Medications  albuterol (PROVENTIL) (2.5 MG/3ML) 0.083% nebulizer solution 2.5 mg (2.5 mg Nebulization Given 04/22/17 2042)     Initial Impression / Assessment and Plan / ED Course  I have reviewed the triage vital signs and the nursing notes.  Pertinent labs & imaging results that were available during my care of the patient were reviewed by me and considered in my medical decision making (see chart for details).  Clinical Course as of Apr 22 2140  Mon Apr 22, 2017  2139 Discussed with patient.   She does not have a legal guardian.  [JK]    Clinical Course User Index [JK] Linwood DibblesKnapp, Gracin Mcpartland, MD    Patient presented to the emergency room for evaluation of cough and congestion.  Patient's laboratory tests are reassuring.  Chest x-ray did not show any evidence of recurrent pneumonia.  Patient is breathing easily in the emergency room.  Her oxygen saturation is normal.  I doubt pneumonia or congestive heart failure.  I suspect her symptoms are related to a bronchitis type infection, likely viral.  Discussed supportive care.  Follow-up with her primary care doctor.  Final Clinical Impressions(s) / ED Diagnoses   Final diagnoses:  Bronchitis    ED Discharge Orders        Ordered    benzonatate (TESSALON) 100 MG capsule  Every 8 hours     04/22/17 2141       Linwood DibblesKnapp, Meleana Commerford, MD 04/22/17 2143

## 2017-05-16 ENCOUNTER — Ambulatory Visit: Payer: Medicare Other | Admitting: Endocrinology

## 2017-05-16 ENCOUNTER — Encounter: Payer: Self-pay | Admitting: Endocrinology

## 2017-05-16 VITALS — BP 140/78 | HR 67 | Wt 146.0 lb

## 2017-05-16 DIAGNOSIS — E0590 Thyrotoxicosis, unspecified without thyrotoxic crisis or storm: Secondary | ICD-10-CM

## 2017-05-16 LAB — T4, FREE: Free T4: 0.53 ng/dL — ABNORMAL LOW (ref 0.60–1.60)

## 2017-05-16 LAB — TSH: TSH: 5.92 u[IU]/mL — ABNORMAL HIGH (ref 0.35–4.50)

## 2017-05-16 NOTE — Patient Instructions (Addendum)
blood tests are requested for you today.  We'll let you know about the results. If ever you have fever while taking methimazole, stop it and call us, even if the reason is obvious, because of the risk of a rare side-effect.  You do not need to stop taking the amiodarone--we'll work around that.  Please come back for a follow-up appointment in 6 months.

## 2017-05-16 NOTE — Progress Notes (Signed)
Subjective:    Patient ID: Paula Bird, female    DOB: 15-Aug-1925, 84 y.o.   MRN: [id number]  HPI Pt returns for f/u of autoimmune thyroid dz (dx'ed 2016; characterized by alternating hyper- and hypothyroidism; she has been on amiodarone since 2013; she was rx'ed tapazole in 2016, for a brief time, but this was changed to synthroid in early 2017; this was stopped in Aug of 2017, for a suppressed TSH, and tapazole was started; she has never had dedicated thyroid imaging, but chest CT in 2017 made no mention of the thyroid; dtr provides hx, as pt has memory loss; pt lives at [facility]).  dtr says pt feels better in general since recent pneumonia.  Past Medical History:  Diagnosis Date  . Acute and chronic respiratory failure, unspecified whether with hypoxia or hypercapnia (HCC)   . Afib (HCC)   . Allergic rhinitis   . Anemia   . CKD (chronic kidney disease)   . Constipation   . COPD (chronic obstructive pulmonary disease) (HCC)   . Depression   . Diplopia   . Dysphagia   . Edema, lower extremity   . Esophageal dysmotility   . Gait instability   . GERD (gastroesophageal reflux disease)   . Hypertension   . Hypothyroidism   . IBS (irritable bowel syndrome)   . IDA (iron deficiency anemia)   . Insomnia   . Leukocytosis   . Myasthenia gravis (HCC)   . Oral thrush   . PAF (paroxysmal atrial fibrillation) (HCC)   . Physical deconditioning   . Pneumonia 02/2017  . Protein calorie malnutrition (HCC)   . Thyroid disease   . Thyroid mass   . Vertigo     Past Surgical History:  Procedure Laterality Date  . ABDOMINAL HYSTERECTOMY    . APPENDECTOMY    . TOTAL HIP ARTHROPLASTY Left   . TOTAL KNEE ARTHROPLASTY Left     Social History   Socioeconomic History  . Marital status: Widowed    Spouse name: Not on file  . Number of children: 2  . Years of education: HS  . Highest education level: Not on file  Social Needs  . Financial resource strain: Not on file    . Food insecurity - worry: Not on file  . Food insecurity - inability: Not on file  . Transportation needs - medical: Not on file  . Transportation needs - non-medical: Not on file  Occupational History  . Occupation: Retired  Tobacco Use  . Smoking status: Never Smoker  . Smokeless tobacco: Never Used  Substance and Sexual Activity  . Alcohol use: No  . Drug use: No  . Sexual activity: Not on file  Other Topics Concern  . Not on file  Social History Narrative   [facility] at [facility].   Left-handed.   2 cups caffeine per day.    Current Outpatient Medications on File Prior to Visit  Medication Sig Dispense Refill  . aspirin EC 81 MG tablet Take 81 mg by mouth daily.    . benzonatate (TESSALON) 100 MG capsule Take 1 capsule (100 mg total) by mouth every 8 (eight) hours. 21 capsule 0  . bisacodyl (DULCOLAX) 10 MG suppository Place 10 mg rectally daily as needed for moderate constipation.    . calcium-vitamin D (OSCAL WITH D) 500-200 MG-UNIT tablet Take 1 tablet by mouth 2 (two) times daily.    . Cholecalciferol (VITAMIN D3) 2000 units TABS Take 2,000 Units by mouth daily.     Marland Kitchen  Cranberry 425 MG CAPS Take 425 mg by mouth 2 (two) times daily.    Marland Kitchen. diltiazem (CARDIZEM CD) 300 MG 24 hr capsule Take 300 mg by mouth daily.  10  . esomeprazole (NEXIUM) 20 MG capsule Take 20 mg by mouth 2 (two) times daily.     Marland Kitchen. guaiFENesin (MUCINEX) 600 MG 12 hr tablet Take 1 tablet (600 mg total) by mouth 2 (two) times daily. For 5days (Patient not taking: Reported on 04/18/2017)    . hydrochlorothiazide (HYDRODIURIL) 25 MG tablet Take 1 tablet (25 mg total) by mouth every Monday, Wednesday, and Friday. 30 tablet 0  . HYDROcodone-acetaminophen (NORCO/VICODIN) 5-325 MG tablet Take 1 tablet by mouth every 6 (six) hours as needed for moderate pain. For pain  0  . hydroxypropyl methylcellulose / hypromellose (ISOPTO TEARS / GONIOVISC) 2.5 % ophthalmic solution Place 1 drop into both eyes 2 (two)  times daily.    Marland Kitchen. lisinopril (PRINIVIL,ZESTRIL) 40 MG tablet Take 40 mg by mouth daily.  10  . lubiprostone (AMITIZA) 8 MCG capsule Take 8 mcg by mouth daily with breakfast.    . Multiple Vitamins-Minerals (MULTIVITAMIN GUMMIES ADULT PO) Take 1 tablet by mouth daily.    . mycophenolate (CELLCEPT) 500 MG tablet Take 1 tablet (500 mg total) by mouth 2 (two) times daily. 60 tablet 11  . PARoxetine (PAXIL) 40 MG tablet Take 40 mg by mouth daily.  3  . polyethylene glycol (MIRALAX / GLYCOLAX) packet Take 17 g by mouth daily. (Patient taking differently: Take 17 g by mouth 2 (two) times daily. ) 14 each 12  . saccharomyces boulardii (FLORASTOR) 250 MG capsule Take 250 mg by mouth 2 (two) times daily.    . sennosides-docusate sodium (SENOKOT-S) 8.6-50 MG tablet Take 1 tablet by mouth 2 (two) times daily. 30 tablet 0  . spironolactone (ALDACTONE) 25 MG tablet Take 25 mg by mouth daily.    . SUMAtriptan (IMITREX) 25 MG tablet Take 50 mg by mouth daily as needed for migraine. May repeat in 2 hours if headache persists or recurs.    Marland Kitchen. zolpidem (AMBIEN) 5 MG tablet 5 mg at bedtime.  0   No current facility-administered medications on file prior to visit.     No Known Allergies  Family History  Problem Relation Age of Onset  . Hypertension Mother   . Hypertension Son   . Diabetes Neg Hx   . Thyroid disease Neg Hx     BP 140/78 (BP Location: Left Arm, Patient Position: Sitting, Cuff Size: Normal)   Pulse 67   Wt 146 lb (66.2 kg)   SpO2 95%   BMI 28.51 kg/m     Review of Systems Denies fever    Objective:   Physical Exam Vital signs: see vs page Gen: elderly, frail, no distress.  In wheelchair NECK: thyroid is approx 5 times normal size, with ireg surface  Neuro: coarse tremor of the hands.  SKIN: not diaphoretic.   Lab Results  Component Value Date   TSH 5.92 (H) 05/16/2017      Assessment & Plan:  Hyperthyroidism: overcontrolled Frail elderly state/memory loss: we discussed.   Pt is receiving palliative care, so I offered to step back, and hold off on thyroid rx altogether.

## 2017-07-04 ENCOUNTER — Other Ambulatory Visit: Payer: Self-pay

## 2017-07-04 ENCOUNTER — Emergency Department (HOSPITAL_COMMUNITY)
Admission: EM | Admit: 2017-07-04 | Discharge: 2017-07-04 | Disposition: A | Discharge status: Home / Self Care | Payer: Medicare Other | Attending: Emergency Medicine | Admitting: Emergency Medicine

## 2017-07-04 ENCOUNTER — Emergency Department (HOSPITAL_COMMUNITY): Payer: Medicare Other

## 2017-07-04 ENCOUNTER — Encounter (HOSPITAL_COMMUNITY): Payer: Self-pay

## 2017-07-04 DIAGNOSIS — R112 Nausea with vomiting, unspecified: Secondary | ICD-10-CM | POA: Diagnosis present

## 2017-07-04 DIAGNOSIS — J449 Chronic obstructive pulmonary disease, unspecified: Secondary | ICD-10-CM | POA: Diagnosis not present

## 2017-07-04 DIAGNOSIS — E279 Disorder of adrenal gland, unspecified: Secondary | ICD-10-CM | POA: Diagnosis not present

## 2017-07-04 DIAGNOSIS — K219 Gastro-esophageal reflux disease without esophagitis: Secondary | ICD-10-CM | POA: Insufficient documentation

## 2017-07-04 DIAGNOSIS — Z96652 Presence of left artificial knee joint: Secondary | ICD-10-CM | POA: Insufficient documentation

## 2017-07-04 DIAGNOSIS — Z7982 Long term (current) use of aspirin: Secondary | ICD-10-CM | POA: Diagnosis not present

## 2017-07-04 DIAGNOSIS — N189 Chronic kidney disease, unspecified: Secondary | ICD-10-CM | POA: Insufficient documentation

## 2017-07-04 DIAGNOSIS — I2510 Atherosclerotic heart disease of native coronary artery without angina pectoris: Secondary | ICD-10-CM | POA: Insufficient documentation

## 2017-07-04 DIAGNOSIS — Z79899 Other long term (current) drug therapy: Secondary | ICD-10-CM | POA: Insufficient documentation

## 2017-07-04 DIAGNOSIS — R197 Diarrhea, unspecified: Secondary | ICD-10-CM

## 2017-07-04 DIAGNOSIS — R945 Abnormal results of liver function studies: Secondary | ICD-10-CM | POA: Diagnosis not present

## 2017-07-04 DIAGNOSIS — Z96642 Presence of left artificial hip joint: Secondary | ICD-10-CM | POA: Diagnosis not present

## 2017-07-04 DIAGNOSIS — D7389 Other diseases of spleen: Secondary | ICD-10-CM | POA: Diagnosis not present

## 2017-07-04 DIAGNOSIS — E039 Hypothyroidism, unspecified: Secondary | ICD-10-CM | POA: Diagnosis not present

## 2017-07-04 DIAGNOSIS — I700 Atherosclerosis of aorta: Secondary | ICD-10-CM

## 2017-07-04 DIAGNOSIS — F329 Major depressive disorder, single episode, unspecified: Secondary | ICD-10-CM | POA: Insufficient documentation

## 2017-07-04 DIAGNOSIS — D739 Disease of spleen, unspecified: Secondary | ICD-10-CM

## 2017-07-04 DIAGNOSIS — I129 Hypertensive chronic kidney disease with stage 1 through stage 4 chronic kidney disease, or unspecified chronic kidney disease: Secondary | ICD-10-CM | POA: Diagnosis not present

## 2017-07-04 DIAGNOSIS — R748 Abnormal levels of other serum enzymes: Secondary | ICD-10-CM

## 2017-07-04 LAB — URINALYSIS, ROUTINE W REFLEX MICROSCOPIC
BILIRUBIN URINE: NEGATIVE
Glucose, UA: NEGATIVE mg/dL
HGB URINE DIPSTICK: NEGATIVE
KETONES UR: NEGATIVE mg/dL
Leukocytes, UA: NEGATIVE
Nitrite: NEGATIVE
PH: 7 (ref 5.0–8.0)
Protein, ur: NEGATIVE mg/dL
SPECIFIC GRAVITY, URINE: 1.016 (ref 1.005–1.030)

## 2017-07-04 LAB — COMPREHENSIVE METABOLIC PANEL
ALBUMIN: 3.5 g/dL (ref 3.5–5.0)
ALK PHOS: 49 U/L (ref 38–126)
ALT: 347 U/L — ABNORMAL HIGH (ref 14–54)
ANION GAP: 11 (ref 5–15)
AST: 126 U/L — AB (ref 15–41)
BILIRUBIN TOTAL: 0.4 mg/dL (ref 0.3–1.2)
BUN: 20 mg/dL (ref 6–20)
CALCIUM: 8.9 mg/dL (ref 8.9–10.3)
CO2: 25 mmol/L (ref 22–32)
Chloride: 102 mmol/L (ref 101–111)
Creatinine, Ser: 0.59 mg/dL (ref 0.44–1.00)
GFR calc Af Amer: >60 mL/min (ref >60)
GFR calc non Af Amer: >60 mL/min (ref >60)
GLUCOSE: 97 mg/dL (ref 65–99)
POTASSIUM: 3.8 mmol/L (ref 3.5–5.1)
SODIUM: 138 mmol/L (ref 135–145)
TOTAL PROTEIN: 6.5 g/dL (ref 6.5–8.1)

## 2017-07-04 LAB — CBC
HEMATOCRIT: 41.4 % (ref 36.0–46.0)
HEMOGLOBIN: 13.1 g/dL (ref 12.0–15.0)
MCH: 29.1 pg (ref 26.0–34.0)
MCHC: 31.6 g/dL (ref 30.0–36.0)
MCV: 92 fL (ref 78.0–100.0)
Platelets: 149 10*3/uL — ABNORMAL LOW (ref 150–400)
RBC: 4.5 MIL/uL (ref 3.87–5.11)
RDW: 13.5 % (ref 11.5–15.5)
WBC: 5.8 10*3/uL (ref 4.0–10.5)

## 2017-07-04 LAB — LIPASE, BLOOD: Lipase: 23 U/L (ref 11–51)

## 2017-07-04 MED ORDER — IOPAMIDOL (ISOVUE-300) INJECTION 61%
100.0000 mL | Freq: Once | INTRAVENOUS | Status: AC | PRN
  Administered 2017-07-04: 100 mL via INTRAVENOUS

## 2017-07-04 MED ORDER — IOPAMIDOL (ISOVUE-300) INJECTION 61%
INTRAVENOUS | Status: AC
  Filled 2017-07-04: qty 100

## 2017-07-04 MED ORDER — SODIUM CHLORIDE 0.9 % IV BOLUS
500.0000 mL | Freq: Once | INTRAVENOUS | Status: AC
  Administered 2017-07-04: 500 mL via INTRAVENOUS

## 2017-07-04 MED ORDER — SUCRALFATE 1 G PO TABS
1.0000 g | ORAL_TABLET | Freq: Once | ORAL | Status: AC
  Administered 2017-07-04: 1 g via ORAL
  Filled 2017-07-04: qty 1

## 2017-07-04 MED ORDER — ONDANSETRON HCL 4 MG PO TABS: 4.0000 mg | ORAL_TABLET | Freq: Three times a day (TID) | ORAL | 0 refills | Status: DC | PRN

## 2017-07-04 MED ORDER — ONDANSETRON HCL 4 MG/2ML IJ SOLN
4.0000 mg | Freq: Once | INTRAMUSCULAR | Status: AC | PRN
  Administered 2017-07-04: 4 mg via INTRAVENOUS
  Filled 2017-07-04: qty 2

## 2017-07-04 NOTE — ED Notes (Signed)
[facility] was contacted.

## 2017-07-04 NOTE — Discharge Instructions (Addendum)
Today your labs showed that you had elevated liver enzymes and you will need to follow-up about this with your primary care doctor to have them rechecked.  You did not have an elevated white blood cell count.  You had normal electrolytes and kidney function.  The CT scan showed a lesion on your spleen as well as on your adrenal gland, and you will need to follow-up about this.  The CT scan also showed aortic atherosclerosis.  I have prescribed a new medication for you today called zofran and it is used to treat your nausea and vomiting. It is important that when you pick the prescription up you discuss the potential interactions of this medication with other medications you are taking, including over the counter medications, with the pharmacists.   This new medication has potential side effects. Be sure to contact your primary care provider or return to the emergency department if you are experiencing new symptoms that you are unable to tolerate after starting the medication. You need to receive medical evaluation immediately if you start to experience blistering of the skin, rash, swelling, or difficulty breathing as these signs could indicate a more serious medication side effect.    You will need to follow-up with your primary care doctor in 1 week for reevaluation to make sure that your symptoms are improving.  You should make sure to stay well hydrated over the next several days and continue to take your acid reflux medications as directed.  Should return to the emergency department for any new or worsening symptoms including fevers, persistent vomiting, persistent abdominal pain, chest pain or shortness of breath.

## 2017-07-04 NOTE — ED Provider Notes (Signed)
[facility]-EMERGENCY DEPT Provider Note   CSN: [id number] Arrival date & time: 07/04/17  1626     History   Chief Complaint Chief Complaint  Patient presents with  . Emesis    HPI Paula Bird is a 84 y.o. female.  HPI   Pt Is an 84 year old female who with history of A. fib, CKD, constipation, COPD, esophageal dysmotility, GERD, IBS who presents the ED today complaining of nausea, vomiting, diarrhea, and generalized abdominal pain that has been intermittent for the last 2 weeks but started to get worse about 3 days ago.  She has had about 2 episodes of vomiting today as well as 2 episodes of diarrhea.  She also reports chills but denies any fevers.  States that she has a history of acid reflux and sometimes this makes her vomit.  States she was seen by gastroenterologist who told her that because of her age the only treatment for her GERD at this time is by medications and by watching what she eats. She also notes that she has intermittent chronic constipation and often needs to take MiraLAX.  Denies any blood in her stool or vomit.  She denies any dysuria, hematuria, or urinary urgency.  Does report hesitancy with urination which she takes medication for.  Denies chest pain or shortness of breath.  Denies any headache currently, but states that she has chronic intermittent headaches when her back pain flares up.  Per EMS note, staff reported that stomach bug is going around the nursing home that she currently resides at which is [facility] in [place].  Past Medical History:  Diagnosis Date  . Acute and chronic respiratory failure, unspecified whether with hypoxia or hypercapnia (HCC)   . Afib (HCC)   . Allergic rhinitis   . Anemia   . CKD (chronic kidney disease)   . Constipation   . COPD (chronic obstructive pulmonary disease) (HCC)   . Depression   . Diplopia   . Dysphagia   . Edema, lower extremity   . Esophageal dysmotility   . Gait  instability   . GERD (gastroesophageal reflux disease)   . Hypertension   . Hypothyroidism   . IBS (irritable bowel syndrome)   . IDA (iron deficiency anemia)   . Insomnia   . Leukocytosis   . Myasthenia gravis (HCC)   . Oral thrush   . PAF (paroxysmal atrial fibrillation) (HCC)   . Physical deconditioning   . Pneumonia 02/2017  . Protein calorie malnutrition (HCC)   . Thyroid disease   . Thyroid mass   . Vertigo     Patient Active Problem List   Diagnosis Date Noted  . Infection due to parainfluenza virus 4 03/06/2017  . Gait abnormality 06/07/2016  . Hyperthyroidism 04/24/2016  . Myasthenia gravis (HCC) 02/07/2016  . Ptosis of eyelid, right 01/31/2016  . Pain, eye, right 01/31/2016  . Falling 01/31/2016  . Vertigo 10/08/2015  . Colon polyp 08/30/2015  . Arthritis, degenerative 08/30/2015  . Acid indigestion 08/30/2015  . Psoriasis 08/30/2015  . Avitaminosis D 08/30/2015  . HCAP (healthcare-associated pneumonia) 08/08/2015  . Respiratory distress 08/08/2015  . Essential hypertension 08/08/2015  . COPD exacerbation (HCC) 08/08/2015  . Dyspnea 08/04/2015  . Hypertension 08/04/2015  . Depression 08/04/2015  . Insomnia 08/04/2015  . Obstructive chronic bronchitis with acute exacerbation (HCC) 08/04/2015  . Thrombocytopenia (HCC) 08/04/2015  . Paroxysmal atrial fibrillation (HCC) 08/04/2015  . Acute exacerbation of chronic obstructive bronchitis (HCC) 08/04/2015  . Infection of urinary  tract 07/01/2014  . Bradycardia 11/24/2013  . Fall in home 11/22/2013  . Pulmonary hypertension (HCC) 11/19/2013  . Excessive falling 09/07/2012  . Encounter for general adult medical examination without abnormal findings 03/24/2012  . Atrial paroxysmal tachycardia (HCC) 10/05/2011  . Family history of colon cancer 05/15/2011  . Mass of pelvis 05/15/2011    Past Surgical History:  Procedure Laterality Date  . ABDOMINAL HYSTERECTOMY    . APPENDECTOMY    . TOTAL HIP ARTHROPLASTY  Left   . TOTAL KNEE ARTHROPLASTY Left      OB History   None      Home Medications    Prior to Admission medications   Medication Sig Start Date End Date Taking? Authorizing Provider  amiodarone (PACERONE) 200 MG tablet Take 200 mg by mouth daily. 06/07/17  Yes [provider]  amitriptyline (ELAVIL) 50 MG tablet Take 50 mg by mouth at bedtime. 06/22/17  Yes [provider]  aspirin EC 81 MG tablet Take 81 mg by mouth daily.   Yes [provider]  bisacodyl (DULCOLAX) 10 MG suppository Place 10 mg rectally daily as needed for moderate constipation.   Yes [provider]  BREO ELLIPTA 100-25 MCG/INH AEPB Take 1 puff by mouth daily. 06/10/17  Yes [provider]  calcium-vitamin D (OSCAL WITH D) 500-200 MG-UNIT tablet Take 1 tablet by mouth 2 (two) times daily.   Yes [provider]  carboxymethylcellulose (REFRESH PLUS) 0.5 % SOLN Place 2 drops into both eyes 2 (two) times daily as needed (dry eyes).   Yes [provider]  Cholecalciferol (VITAMIN D3) 2000 units TABS Take 2,000 Units by mouth daily.    Yes [provider]  Cranberry 425 MG CAPS Take 425 mg by mouth 2 (two) times daily.   Yes [provider]  diltiazem (CARDIZEM CD) 300 MG 24 hr capsule Take 300 mg by mouth daily. 03/04/16  Yes [provider]  esomeprazole (NEXIUM) 20 MG capsule Take 20 mg by mouth 2 (two) times daily.    Yes [provider]  hydrochlorothiazide (HYDRODIURIL) 25 MG tablet Take 1 tablet (25 mg total) by mouth every Monday, Wednesday, and Friday. 08/16/15  Yes [name], MD  HYDROcodone-acetaminophen (NORCO/VICODIN) 5-325 MG tablet Take 1 tablet by mouth every 6 (six) hours as needed for moderate pain. For pain 03/13/17  Yes Zannie Cove, MD  INCRUSE ELLIPTA 62.5 MCG/INH AEPB Take 1 puff by mouth daily. 06/10/17  Yes [provider]  lisinopril (PRINIVIL,ZESTRIL) 40 MG tablet Take 40 mg by mouth daily.  03/04/16  Yes [provider]  Multiple Vitamins-Minerals (MULTIVITAMIN GUMMIES ADULT PO) Take 1 tablet by mouth daily.   Yes [provider]  mycophenolate (CELLCEPT) 500 MG tablet Take 1 tablet (500 mg total) by mouth 2 (two) times daily. 10/16/16  Yes Levert Feinstein, MD  polyethylene glycol (MIRALAX / GLYCOLAX) packet Take 17 g by mouth daily. Patient taking differently: Take 17 g by mouth 2 (two) times daily.  08/27/14  Yes Harris, Abigail, PA-C  promethazine (PHENERGAN) 25 MG tablet Take 25 mg by mouth every 6 (six) hours as needed. for GERD 07/03/17  Yes [provider]  senna (SENEXON) 8.6 MG tablet Take 2 tablets by mouth 2 (two) times daily.   Yes [provider]  spironolactone (ALDACTONE) 25 MG tablet Take 25 mg by mouth daily.   Yes [provider]  SUMAtriptan (IMITREX) 25 MG tablet Take 50 mg by mouth daily as needed for migraine.  May repeat in 2 hours if headache persists or recurs.   Yes [provider]  TRINTELLIX 10 MG TABS tablet Take 10 mg by mouth daily. 06/07/17  Yes [provider]  zolpidem (AMBIEN) 5 MG tablet 5 mg at bedtime. 03/29/17  Yes [provider]  benzonatate (TESSALON) 100 MG capsule Take 1 capsule (100 mg total) by mouth every 8 (eight) hours. 04/22/17   Linwood Dibbles, MD  diltiazem (TIAZAC) 300 MG 24 hr capsule Take 300 mg by mouth daily. 06/24/17   [provider]  guaiFENesin (MUCINEX) 600 MG 12 hr tablet Take 1 tablet (600 mg total) by mouth 2 (two) times daily. For 5days Patient not taking: Reported on 04/18/2017 03/13/17   Zannie Cove, MD  ondansetron [facility]) 4 MG tablet Take 1 tablet (4 mg total) by mouth every 8 (eight) hours as needed for nausea or vomiting. 07/04/17   Yoanna Jurczyk S, PA-C  sennosides-docusate sodium (SENOKOT-S) 8.6-50 MG tablet Take 1 tablet by mouth 2 (two) times daily. 08/16/15   [name], MD    Family History Family History  Problem Relation Age of Onset  .  Hypertension Mother   . Hypertension Son   . Diabetes Neg Hx   . Thyroid disease Neg Hx     Social History Social History   Tobacco Use  . Smoking status: Never Smoker  . Smokeless tobacco: Never Used  Substance Use Topics  . Alcohol use: No  . Drug use: No     Allergies   Patient has no known allergies.   Review of Systems Review of Systems  Constitutional: Positive for chills. Negative for fever.  HENT: Negative for sore throat.   Eyes: Negative for visual disturbance.  Respiratory: Negative for cough and shortness of breath.   Cardiovascular: Negative for chest pain.  Gastrointestinal: Positive for abdominal pain, constipation, diarrhea, nausea and vomiting. Negative for blood in stool.  Genitourinary: Negative for dysuria, hematuria, pelvic pain and urgency.       Urinary hesitancy (chronic)  Musculoskeletal: Positive for back pain (chronic).  Neurological: Negative for headaches.     Physical Exam Updated Vital Signs BP (!) 127/94 (BP Location: Right Arm)   Pulse 79   Temp 98.3 F (36.8 C) (Oral)   Resp 18   Ht 5' (1.524 m)   Wt 63.5 kg (140 lb)   SpO2 92%   BMI 27.34 kg/m   Physical Exam  Constitutional: She is oriented to person, place, and time. She appears well-developed and well-nourished. No distress.  Patient is nontoxic-appearing and in no acute distress.  HENT:  Head: Normocephalic and atraumatic.  Mucous membranes are dry  Eyes: Conjunctivae are normal.  Neck: Neck supple.  Cardiovascular: Normal rate, regular rhythm, normal heart sounds and intact distal pulses.  No murmur heard. Pulmonary/Chest: Effort normal and breath sounds normal. No respiratory distress. She has no wheezes.  Abdominal: Soft. Bowel sounds are normal.  Patient has diffuse abdominal tenderness which is worse in the periumbilical area.  Her belly is soft and she has normoactive bowel sounds.  There is no rebound.  There is some guarding.  Musculoskeletal: She exhibits  no edema.  Neurological: She is alert and oriented to person, place, and time.  Skin: Skin is warm and dry. Capillary refill takes less than 2 seconds.  Psychiatric: She has a normal mood and affect.  Nursing note and vitals reviewed.    ED Treatments / Results  Labs (all labs ordered are listed, but only  abnormal results are displayed) Labs Reviewed  COMPREHENSIVE METABOLIC PANEL - Abnormal; Notable for the following components:      Result Value   AST 126 (*)    ALT 347 (*)    All other components within normal limits  CBC - Abnormal; Notable for the following components:   Platelets 149 (*)    All other components within normal limits  URINALYSIS, ROUTINE W REFLEX MICROSCOPIC - Abnormal; Notable for the following components:   Color, Urine STRAW (*)    All other components within normal limits  URINE CULTURE  LIPASE, BLOOD    EKG None  Radiology Ct Abdomen Pelvis W Contrast  Result Date: 07/04/2017 CLINICAL DATA:  Intermittent nausea, vomiting, and diarrhea for the past 2 weeks, worsened over the past 3 days. Generalized abdominal pain. EXAM: CT ABDOMEN AND PELVIS WITH CONTRAST TECHNIQUE: Multidetector CT imaging of the abdomen and pelvis was performed using the standard protocol following bolus administration of intravenous contrast. CONTRAST:  ISOVUE-300 IOPAMIDOL (ISOVUE-300) INJECTION 61% COMPARISON:  None. FINDINGS: Lower chest: No acute abnormality.  Scarring at the lung bases. Hepatobiliary: No focal liver abnormality is seen. No gallstones, gallbladder wall thickening, or biliary dilatation. Pancreas: Mild atrophy. No ductal dilatation or surrounding inflammatory changes. Spleen: Normal in size. There is a 1.9 cm indeterminate, enhancing lesion within the spleen. Adrenals/Urinary Tract: There is a 1.1 cm indeterminate lesion within the left adrenal gland. The right adrenal gland is unremarkable. The bilateral kidneys are unremarkable. No renal or ureteral calculi. No  hydronephrosis. The bladder is unremarkable. Stomach/Bowel: Stomach is within normal limits. Appendix is not visualized in this patient with a history of prior appendectomy. No evidence of bowel wall thickening, distention, or inflammatory changes. Vascular/Lymphatic: Aortic atherosclerosis. No enlarged abdominal or pelvic lymph nodes. Reproductive: Status post hysterectomy. No adnexal masses. Other: No abdominal wall hernia or abnormality. No abdominopelvic ascites. No pneumoperitoneum. Musculoskeletal: Prior left total hip arthroplasty. Old healed right superior and inferior pubic rami fractures. Chronic compression deformities of T12 and L1. Prior cement augmentation of T12 and L2. Moderate osteoarthritis of the right hip. IMPRESSION: 1.  No acute intra-abdominal process. 2. 1.9 cm indeterminate lesion within the spleen. Recommend follow-up contrast-enhanced MRI in 6-12 months for further evaluation. This recommendation follows ACR consensus guidelines: White Paper of the ACR Incidental Findings Committee II on Splenic and Nodal Findings. J Am Coll Radiol 2013;10:789-794. 3. Indeterminate 1.1 cm nodule within the left adrenal gland, probably a benign adenoma. Consider follow-up adrenal protocol CT in 12 months for further evaluation. This recommendation follows ACR consensus guidelines: Management of Incidental Adrenal Masses: A White Paper of the ACR Incidental Findings Committee. J Am Coll Radiol 2017;14:1038-1044. 4.  Aortic atherosclerosis (ICD10-I70.0). Electronically Signed   By: Obie Dredge M.D.   On: 07/04/2017 20:50    Procedures Procedures (including critical care time)  Medications Ordered in ED Medications  iopamidol (ISOVUE-300) 61 % injection (has no administration in time range)  ondansetron (ZOFRAN) injection 4 mg (4 mg Intravenous Given 07/04/17 1909)  sodium chloride 0.9 % bolus 500 mL (0 mLs Intravenous Stopped 07/04/17 1955)  sucralfate (CARAFATE) tablet 1 g (1 g Oral Given  07/04/17 1945)  iopamidol (ISOVUE-300) 61 % injection 100 mL (100 mLs Intravenous Contrast Given 07/04/17 2018)     Initial Impression / Assessment and Plan / ED Course  I have reviewed the triage vital signs and the nursing notes.  Pertinent labs & imaging results that were available during my care of the patient  were reviewed by me and considered in my medical decision making (see chart for details).       Final Clinical Impressions(s) / ED Diagnoses   Final diagnoses:  Nausea vomiting and diarrhea  Gastroesophageal reflux disease, esophagitis presence not specified  Lesion of spleen  Lesion of adrenal gland (HCC)  Aortic atherosclerosis (HCC)  Elevated liver enzymes   Patient presenting with nausea, vomiting, diarrhea, and abdominal pain that has been intermittent for the last several weeks but worsened over the last 3 days.  Staff at her assisted living facility as well as her son endorse that gastrointestinal bug is going around the facility.  Patient symptoms are likely to do a viral gastroenteritis.  She also has underlying GERD which sometimes causes her to vomit and this may be contributing to her symptoms.  She has been evaluated by her gastrointestinal doctor about this and is currently on medications for this.    She was given Carafate in the ED and states that her symptoms feel improved.  Advised her to continue taking her home medications to help with her symptoms.  Her lab work showed no leukocytosis.  She did have some elevation in her transaminases, which could be due to her current GI infection and advised her to follow-up with her PCP about this.  She had no gross electrolyte abnormalities on her CMP.  Her kidney function was normal.  Lipase was within normal limits.  UA was negative for infection.  CT scan showed no acute intra-abdominal pathology.  Lesions were noted to the adrenal glands and spleen this was discussed with patient and advised her to follow-up with her PCP  about this.  Patient given prescription for short course of Zofran and advised to push fluids for the next several days.  Advised to follow-up PCP in 1 week for reevaluation as well as to follow-up with her neurologist.  Advised her to return to the ER for any new or worsening symptoms.  Patient son at bedside and both he and patient understand the plan and reasons to return immediately to the ER.  All questions were answered.   ED Discharge Orders        Ordered    ondansetron (ZOFRAN) 4 MG tablet  Every 8 hours PRN     07/04/17 2216       Karrie MeresCouture, Kortlynn Poust S, PA-C 07/04/17 2335    Gerhard MunchLockwood, Robert, MD 07/07/17 [id number]

## 2017-07-04 NOTE — ED Triage Notes (Signed)
From [facility]. Arrives via EMS c/o N/V/D intermittent x2 weeks that has gotten worse over the past 3 days. Staff reports there has been a stomach bug going around starting about 3 weeks ago. Pt unable to keep any meds down.  Pt c/o generalized abd pain and HA.

## 2017-07-06 LAB — URINE CULTURE: CULTURE: NO GROWTH

## 2017-07-17 ENCOUNTER — Inpatient Hospital Stay (HOSPITAL_COMMUNITY)
Admission: EM | Admit: 2017-07-17 | Discharge: 2017-07-23 | DRG: 872 | Disposition: A | Discharge status: Skilled Nursing Facility | Payer: Medicare Other | Source: Skilled Nursing Facility | Attending: Internal Medicine | Admitting: Internal Medicine

## 2017-07-17 ENCOUNTER — Other Ambulatory Visit: Payer: Self-pay

## 2017-07-17 ENCOUNTER — Encounter (HOSPITAL_COMMUNITY): Payer: Self-pay

## 2017-07-17 ENCOUNTER — Emergency Department (HOSPITAL_COMMUNITY): Payer: Medicare Other

## 2017-07-17 DIAGNOSIS — N3000 Acute cystitis without hematuria: Secondary | ICD-10-CM | POA: Diagnosis present

## 2017-07-17 DIAGNOSIS — Z96652 Presence of left artificial knee joint: Secondary | ICD-10-CM | POA: Diagnosis present

## 2017-07-17 DIAGNOSIS — N3001 Acute cystitis with hematuria: Secondary | ICD-10-CM | POA: Diagnosis not present

## 2017-07-17 DIAGNOSIS — Z96642 Presence of left artificial hip joint: Secondary | ICD-10-CM | POA: Diagnosis present

## 2017-07-17 DIAGNOSIS — E860 Dehydration: Secondary | ICD-10-CM | POA: Diagnosis present

## 2017-07-17 DIAGNOSIS — E039 Hypothyroidism, unspecified: Secondary | ICD-10-CM | POA: Diagnosis present

## 2017-07-17 DIAGNOSIS — G7000 Myasthenia gravis without (acute) exacerbation: Secondary | ICD-10-CM | POA: Diagnosis not present

## 2017-07-17 DIAGNOSIS — I480 Paroxysmal atrial fibrillation: Secondary | ICD-10-CM | POA: Diagnosis present

## 2017-07-17 DIAGNOSIS — K219 Gastro-esophageal reflux disease without esophagitis: Secondary | ICD-10-CM | POA: Diagnosis present

## 2017-07-17 DIAGNOSIS — K2970 Gastritis, unspecified, without bleeding: Secondary | ICD-10-CM | POA: Diagnosis present

## 2017-07-17 DIAGNOSIS — K259 Gastric ulcer, unspecified as acute or chronic, without hemorrhage or perforation: Secondary | ICD-10-CM | POA: Diagnosis present

## 2017-07-17 DIAGNOSIS — K30 Functional dyspepsia: Secondary | ICD-10-CM | POA: Diagnosis present

## 2017-07-17 DIAGNOSIS — Z66 Do not resuscitate: Secondary | ICD-10-CM | POA: Diagnosis present

## 2017-07-17 DIAGNOSIS — F32A Depression, unspecified: Secondary | ICD-10-CM | POA: Diagnosis present

## 2017-07-17 DIAGNOSIS — N189 Chronic kidney disease, unspecified: Secondary | ICD-10-CM | POA: Diagnosis present

## 2017-07-17 DIAGNOSIS — R112 Nausea with vomiting, unspecified: Secondary | ICD-10-CM | POA: Diagnosis not present

## 2017-07-17 DIAGNOSIS — R509 Fever, unspecified: Secondary | ICD-10-CM | POA: Diagnosis not present

## 2017-07-17 DIAGNOSIS — A4151 Sepsis due to Escherichia coli [E. coli]: Secondary | ICD-10-CM | POA: Diagnosis not present

## 2017-07-17 DIAGNOSIS — F329 Major depressive disorder, single episode, unspecified: Secondary | ICD-10-CM | POA: Diagnosis present

## 2017-07-17 DIAGNOSIS — I129 Hypertensive chronic kidney disease with stage 1 through stage 4 chronic kidney disease, or unspecified chronic kidney disease: Secondary | ICD-10-CM | POA: Diagnosis present

## 2017-07-17 DIAGNOSIS — K317 Polyp of stomach and duodenum: Secondary | ICD-10-CM | POA: Diagnosis present

## 2017-07-17 DIAGNOSIS — N390 Urinary tract infection, site not specified: Secondary | ICD-10-CM | POA: Diagnosis present

## 2017-07-17 DIAGNOSIS — K5900 Constipation, unspecified: Secondary | ICD-10-CM

## 2017-07-17 DIAGNOSIS — J441 Chronic obstructive pulmonary disease with (acute) exacerbation: Secondary | ICD-10-CM | POA: Diagnosis present

## 2017-07-17 DIAGNOSIS — E871 Hypo-osmolality and hyponatremia: Secondary | ICD-10-CM | POA: Diagnosis present

## 2017-07-17 DIAGNOSIS — D649 Anemia, unspecified: Secondary | ICD-10-CM | POA: Diagnosis present

## 2017-07-17 DIAGNOSIS — Z90710 Acquired absence of both cervix and uterus: Secondary | ICD-10-CM

## 2017-07-17 DIAGNOSIS — K580 Irritable bowel syndrome with diarrhea: Secondary | ICD-10-CM | POA: Diagnosis present

## 2017-07-17 DIAGNOSIS — I10 Essential (primary) hypertension: Secondary | ICD-10-CM | POA: Diagnosis not present

## 2017-07-17 LAB — URINALYSIS, ROUTINE W REFLEX MICROSCOPIC
Bilirubin Urine: NEGATIVE
GLUCOSE, UA: NEGATIVE mg/dL
Ketones, ur: NEGATIVE mg/dL
Nitrite: NEGATIVE
PH: 7 (ref 5.0–8.0)
Protein, ur: NEGATIVE mg/dL
Specific Gravity, Urine: 1.01 (ref 1.005–1.030)

## 2017-07-17 LAB — COMPREHENSIVE METABOLIC PANEL
ALT: 23 U/L (ref 14–54)
AST: 18 U/L (ref 15–41)
Albumin: 3.6 g/dL (ref 3.5–5.0)
Alkaline Phosphatase: 52 U/L (ref 38–126)
Anion gap: 9 (ref 5–15)
BUN: 22 mg/dL — AB (ref 6–20)
CHLORIDE: 102 mmol/L (ref 101–111)
CO2: 22 mmol/L (ref 22–32)
CREATININE: 0.58 mg/dL (ref 0.44–1.00)
Calcium: 8.8 mg/dL — ABNORMAL LOW (ref 8.9–10.3)
GFR calc Af Amer: >60 mL/min (ref >60)
GLUCOSE: 135 mg/dL — AB (ref 65–99)
Potassium: 3.6 mmol/L (ref 3.5–5.1)
Sodium: 133 mmol/L — ABNORMAL LOW (ref 135–145)
Total Bilirubin: 0.6 mg/dL (ref 0.3–1.2)
Total Protein: 6.9 g/dL (ref 6.5–8.1)

## 2017-07-17 LAB — CBC WITH DIFFERENTIAL/PLATELET
Basophils Absolute: 0 10*3/uL (ref 0.0–0.1)
Basophils Relative: 0 %
EOS PCT: 0 %
Eosinophils Absolute: 0 10*3/uL (ref 0.0–0.7)
HCT: 38.8 % (ref 36.0–46.0)
Hemoglobin: 12.3 g/dL (ref 12.0–15.0)
LYMPHS ABS: 0.4 10*3/uL — AB (ref 0.7–4.0)
Lymphocytes Relative: 6 %
MCH: 28.3 pg (ref 26.0–34.0)
MCHC: 31.7 g/dL (ref 30.0–36.0)
MCV: 89.2 fL (ref 78.0–100.0)
MONO ABS: 1.5 10*3/uL — AB (ref 0.1–1.0)
MONOS PCT: 21 %
Neutro Abs: 5.3 10*3/uL (ref 1.7–7.7)
Neutrophils Relative %: 73 %
PLATELETS: 177 10*3/uL (ref 150–400)
RBC: 4.35 MIL/uL (ref 3.87–5.11)
RDW: 13.2 % (ref 11.5–15.5)
WBC: 7.3 10*3/uL (ref 4.0–10.5)

## 2017-07-17 LAB — I-STAT CG4 LACTIC ACID, ED: LACTIC ACID, VENOUS: 1.43 mmol/L (ref 0.5–1.9)

## 2017-07-17 MED ORDER — ONDANSETRON HCL 4 MG/2ML IJ SOLN
4.0000 mg | Freq: Once | INTRAMUSCULAR | Status: AC
  Administered 2017-07-17: 4 mg via INTRAVENOUS
  Filled 2017-07-17: qty 2

## 2017-07-17 MED ORDER — FAMOTIDINE IN NACL 20-0.9 MG/50ML-% IV SOLN
20.0000 mg | Freq: Two times a day (BID) | INTRAVENOUS | Status: DC
  Administered 2017-07-17 – 2017-07-19 (×4): 20 mg via INTRAVENOUS
  Filled 2017-07-17 (×4): qty 50

## 2017-07-17 MED ORDER — PIPERACILLIN-TAZOBACTAM 3.375 G IVPB 30 MIN
3.3750 g | Freq: Once | INTRAVENOUS | Status: AC
  Administered 2017-07-17: 3.375 g via INTRAVENOUS
  Filled 2017-07-17: qty 50

## 2017-07-17 MED ORDER — PANTOPRAZOLE SODIUM 40 MG PO TBEC
40.0000 mg | DELAYED_RELEASE_TABLET | Freq: Two times a day (BID) | ORAL | Status: DC
  Administered 2017-07-17 – 2017-07-23 (×11): 40 mg via ORAL
  Filled 2017-07-17 (×11): qty 1

## 2017-07-17 MED ORDER — ACETAMINOPHEN 650 MG RE SUPP
650.0000 mg | Freq: Once | RECTAL | Status: AC
  Administered 2017-07-17: 650 mg via RECTAL
  Filled 2017-07-17: qty 1

## 2017-07-17 MED ORDER — CEFTRIAXONE SODIUM 1 G IJ SOLR
1.0000 g | INTRAMUSCULAR | Status: DC
  Administered 2017-07-17: 1 g via INTRAVENOUS
  Filled 2017-07-17: qty 10

## 2017-07-17 MED ORDER — SODIUM CHLORIDE 0.9 % IV BOLUS (SEPSIS)
1000.0000 mL | Freq: Once | INTRAVENOUS | Status: AC
  Administered 2017-07-17: 1000 mL via INTRAVENOUS

## 2017-07-17 MED ORDER — SODIUM CHLORIDE 0.9 % IV SOLN
INTRAVENOUS | Status: DC
  Administered 2017-07-17 – 2017-07-18 (×2): via INTRAVENOUS

## 2017-07-17 MED ORDER — ONDANSETRON HCL 4 MG/2ML IJ SOLN
4.0000 mg | Freq: Four times a day (QID) | INTRAMUSCULAR | Status: DC | PRN
  Administered 2017-07-18 – 2017-07-23 (×3): 4 mg via INTRAVENOUS
  Filled 2017-07-17 (×3): qty 2

## 2017-07-17 NOTE — ED Notes (Signed)
Pt's pure wick has been checked at this time for a urine sample with no sample available.

## 2017-07-17 NOTE — H&P (Signed)
History and Physical    Paula Bird ZOX:[id number] DOB: Dec 20, 1925 DOA: 07/17/2017  PCP: Angela Cox, MD  Patient coming from: ALF.  I have personally briefly reviewed patient's old medical records in [facility]  Chief Complaint: nausea, vomiting and fever.   HPI: Paula Bird is a 84 y.o. female with medical history significant of copd, depression, atrial fibrillation not on anticoagulation, hypertension, gerd, comes in for fever and chills. As per the patient and her son at bedside, they report persistent nausea, vomiting and diarrhea for almost 3 week now. Her diarrhea improved, but her nausea and vomiting was persistent, and this morning, she started having fevers and chills. No hematemesis or hematochezia. She also reports worsening reflux.  No chest pain or sob, palpitations, syncope. She reports abdominal discomfort when she was having diarrhea. Currently she denies any abd pain.  No headaches or dizziness.  On arrival to ED, her labs were unremarkable except for abnormal UA.  She was referred to medical service for evaluation and management of UTI and dehydration.   Review of Systems: As per HPI otherwise 10 point review of systems negative.    Past Medical History:  Diagnosis Date  . Acute and chronic respiratory failure, unspecified whether with hypoxia or hypercapnia (HCC)   . Afib (HCC)   . Allergic rhinitis   . Anemia   . CKD (chronic kidney disease)   . Constipation   . COPD (chronic obstructive pulmonary disease) (HCC)   . Depression   . Diplopia   . Dysphagia   . Edema, lower extremity   . Esophageal dysmotility   . Gait instability   . GERD (gastroesophageal reflux disease)   . Hypertension   . Hypothyroidism   . IBS (irritable bowel syndrome)   . IDA (iron deficiency anemia)   . Insomnia   . Leukocytosis   . Myasthenia gravis (HCC)   . Oral thrush   . PAF (paroxysmal atrial fibrillation) (HCC)   . Physical deconditioning   .  Pneumonia 02/2017  . Protein calorie malnutrition (HCC)   . Thyroid disease   . Thyroid mass   . Vertigo     Past Surgical History:  Procedure Laterality Date  . ABDOMINAL HYSTERECTOMY    . APPENDECTOMY    . TOTAL HIP ARTHROPLASTY Left   . TOTAL KNEE ARTHROPLASTY Left      reports that she has never smoked. She has never used smokeless tobacco. She reports that she does not drink alcohol or use drugs.  No Known Allergies  Family History  Problem Relation Age of Onset  . Hypertension Mother   . Hypertension Son   . Diabetes Neg Hx   . Thyroid disease Neg Hx    Reviewed and not pertinent.   Prior to Admission medications   Medication Sig Start Date End Date Taking? Authorizing Provider  amiodarone (PACERONE) 200 MG tablet Take 200 mg by mouth daily. 06/07/17  Yes [provider]  amitriptyline (ELAVIL) 50 MG tablet Take 50 mg by mouth at bedtime. 06/22/17  Yes [provider]  aspirin EC 81 MG tablet Take 81 mg by mouth daily.   Yes [provider]  bisacodyl (DULCOLAX) 10 MG suppository Place 10 mg rectally daily as needed for moderate constipation.   Yes [provider]  BREO ELLIPTA 100-25 MCG/INH AEPB Take 1 puff by mouth daily. 06/10/17  Yes [provider]  calcium-vitamin D (OSCAL WITH D) 500-200 MG-UNIT tablet Take 1 tablet by mouth 2 (two)  times daily.   Yes [provider]  carboxymethylcellulose (REFRESH PLUS) 0.5 % SOLN Place 2 drops into both eyes 2 (two) times daily as needed (dry eyes).   Yes [provider]  Cholecalciferol (VITAMIN D3) 2000 units TABS Take 2,000 Units by mouth daily.    Yes [provider]  Cranberry 425 MG CAPS Take 425 mg by mouth 2 (two) times daily.   Yes [provider]  diltiazem (CARDIZEM LA) 300 MG 24 hr tablet Take 300 mg by mouth daily. 03/04/16  Yes [provider]  esomeprazole (NEXIUM) 20 MG capsule Take 20 mg by mouth 2 (two) times daily.    Yes  [provider]  hydrochlorothiazide (HYDRODIURIL) 25 MG tablet Take 1 tablet (25 mg total) by mouth every Monday, Wednesday, and Friday. 08/16/15  Yes [name], MD  HYDROcodone-acetaminophen (NORCO/VICODIN) 5-325 MG tablet Take 1 tablet by mouth every 6 (six) hours as needed for moderate pain. For pain 03/13/17  Yes Zannie Cove, MD  INCRUSE ELLIPTA 62.5 MCG/INH AEPB Take 1 puff by mouth daily. 06/10/17  Yes [provider]  lisinopril (PRINIVIL,ZESTRIL) 40 MG tablet Take 40 mg by mouth daily. 03/04/16  Yes [provider]  loperamide (IMODIUM A-D) 2 MG tablet Take 2 mg by mouth every 6 (six) hours as needed for diarrhea or loose stools.   Yes [provider]  mycophenolate (CELLCEPT) 500 MG tablet Take 1 tablet (500 mg total) by mouth 2 (two) times daily. 10/16/16  Yes Levert Feinstein, MD  ondansetron (ZOFRAN) 4 MG tablet Take 1 tablet (4 mg total) by mouth every 8 (eight) hours as needed for nausea or vomiting. 07/04/17  Yes Couture, Cortni S, PA-C  OVER THE COUNTER MEDICATION Take 1 tablet by mouth daily. Vitafusion MVI adult gummies   Yes [provider]  polyethylene glycol (MIRALAX / GLYCOLAX) packet Take 17 g by mouth daily. Patient taking differently: Take 17 g by mouth 2 (two) times daily.  08/27/14  Yes Harris, Abigail, PA-C  senna (SENEXON) 8.6 MG tablet Take 2 tablets by mouth 2 (two) times daily.   Yes [provider]  sennosides-docusate sodium (SENOKOT-S) 8.6-50 MG tablet Take 1 tablet by mouth 2 (two) times daily. 08/16/15  Yes [name], MD  spironolactone (ALDACTONE) 25 MG tablet Take 25 mg by mouth daily.   Yes [provider]  SUMAtriptan (IMITREX) 25 MG tablet Take 50 mg by mouth daily as needed for migraine. May repeat in 2 hours if headache persists or recurs.   Yes [provider]  TRINTELLIX 10 MG TABS tablet Take 10 mg by mouth daily. 06/07/17  Yes [provider]  zolpidem (AMBIEN) 5 MG tablet 5 mg at  bedtime. 03/29/17  Yes [provider]  benzonatate (TESSALON) 100 MG capsule Take 1 capsule (100 mg total) by mouth every 8 (eight) hours. Patient not taking: Reported on 07/17/2017 04/22/17   Linwood Dibbles, MD  guaiFENesin (MUCINEX) 600 MG 12 hr tablet Take 1 tablet (600 mg total) by mouth 2 (two) times daily. For 5days Patient not taking: Reported on 04/18/2017 03/13/17   Zannie Cove, MD  promethazine (PHENERGAN) 25 MG tablet Take 25 mg by mouth every 6 (six) hours as needed. for GERD 07/03/17   [provider]    Physical Exam: Vitals:   07/17/17 1430 07/17/17 1531 07/17/17 1730 07/17/17 1830  BP: 131/69 103/72 (!) 108/55 (!) 109/55  Pulse: 90 84 74 74  Resp: (!) 22 18 (!) 22 14  Temp:      TempSrc:      SpO2: 100% 99% 96% 97%  Weight:      Height:        Constitutional: NAD, calm, comfortable Vitals:   07/17/17 1430 07/17/17 1531 07/17/17 1730 07/17/17 1830  BP: 131/69 103/72 (!) 108/55 (!) 109/55  Pulse: 90 84 74 74  Resp: (!) 22 18 (!) 22 14  Temp:      TempSrc:      SpO2: 100% 99% 96% 97%  Weight:      Height:       Eyes: PERRL, lids and conjunctivae normal ENMT: Mucous membranes are dry. .Normal dentition.  Neck: normal, supple, no masses, no thyromegaly Respiratory: clear to auscultation bilaterally, no wheezing, no crackles. Normal respiratory effort. No accessory muscle use.  Cardiovascular: Regular rate and rhythm, no murmurs / rubs / gallops. No extremity edema. 2+ pedal pulses. No carotid bruits.  Abdomen: no tenderness, no masses palpated. No hepatosplenomegaly. Bowel sounds positive.  Musculoskeletal: no clubbing / cyanosis. No joint deformity upper and lower extremities. Good ROM, no contractures. Normal muscle tone.  Skin: no rashes, lesions, ulcers. No induration Neurologic: CN 2-12 grossly intact. Sensation intact,  Strength 5/5 in all 4.  Psychiatric: Normal judgment and insight. Alert and oriented x 3. Normal mood.     Labs on  Admission: I have personally reviewed following labs and imaging studies  CBC: Recent Labs  Lab 07/17/17 1134  WBC 7.3  NEUTROABS 5.3  HGB 12.3  HCT 38.8  MCV 89.2  PLT 177   Basic Metabolic Panel: Recent Labs  Lab 07/17/17 1134  NA 133*  K 3.6  CL 102  CO2 22  GLUCOSE 135*  BUN 22*  CREATININE 0.58  CALCIUM 8.8*   GFR: Estimated Creatinine Clearance: 39.7 mL/min (by C-G formula based on SCr of 0.58 mg/dL). Liver Function Tests: Recent Labs  Lab 07/17/17 1134  AST 18  ALT 23  ALKPHOS 52  BILITOT 0.6  PROT 6.9  ALBUMIN 3.6   No results for input(s): LIPASE, AMYLASE in the last 168 hours. No results for input(s): AMMONIA in the last 168 hours. Coagulation Profile: No results for input(s): INR, PROTIME in the last 168 hours. Cardiac Enzymes: No results for input(s): CKTOTAL, CKMB, CKMBINDEX, TROPONINI in the last 168 hours. BNP (last 3 results) No results for input(s): PROBNP in the last 8760 hours. HbA1C: No results for input(s): HGBA1C in the last 72 hours. CBG: No results for input(s): GLUCAP in the last 168 hours. Lipid Profile: No results for input(s): CHOL, HDL, LDLCALC, TRIG, CHOLHDL, LDLDIRECT in the last 72 hours. Thyroid Function Tests: No results for input(s): TSH, T4TOTAL, FREET4, T3FREE, THYROIDAB in the last 72 hours. Anemia Panel: No results for input(s): VITAMINB12, FOLATE, FERRITIN, TIBC, IRON, RETICCTPCT in the last 72 hours. Urine analysis:    Component Value Date/Time   COLORURINE YELLOW 07/17/2017 1531   APPEARANCEUR HAZY (A) 07/17/2017 1531   LABSPEC 1.010 07/17/2017 1531   PHURINE 7.0 07/17/2017 1531   GLUCOSEU NEGATIVE 07/17/2017 1531   HGBUR SMALL (A) 07/17/2017 1531   BILIRUBINUR NEGATIVE 07/17/2017 1531   KETONESUR NEGATIVE 07/17/2017 1531   PROTEINUR NEGATIVE 07/17/2017 1531   UROBILINOGEN 0.2 08/13/2014 2256   NITRITE NEGATIVE 07/17/2017 1531   LEUKOCYTESUR MODERATE (A) 07/17/2017 1531    Radiological Exams on  Admission: Dg Chest 2 View  Result Date: 07/17/2017 CLINICAL DATA:  Cough and shortness of breath. EXAM: CHEST - 2 VIEW COMPARISON:  04/22/2017  FINDINGS: Lungs are hyperexpanded. Interstitial markings are diffusely coarsened with chronic features. Heart size upper normal. Stable basilar scarring bilaterally. Bones are diffusely demineralized. Telemetry leads overlie the chest. IMPRESSION: Stable.  Hyperexpansion without acute cardiopulmonary findings. Electronically Signed   By: Kennith Center M.D.   On: 07/17/2017 13:10    EKG: not done.   Assessment/Plan Active Problems:   Hypertension   Depression   Paroxysmal atrial fibrillation (HCC)   COPD exacerbation (HCC)   Acid indigestion   Myasthenia gravis (HCC)   UTI (urinary tract infection)    Acute cystitis/ UTI/ SIRS ON admission.  On adm pt was febrile, hypotensive, normal lactic acid with abnormal UA.  Admit for management for UTI.  Blood and urine cultures done and pending.  One dose of zosyn given. cntinue with rocephin till we get culture report.  Gentle hydration and PT evaluation in am.    Nausea, vomiting and diarrhea:  Suspect gastroenteritis.  If diarrhea re occurs send stool for gi pathogen pcr.  Symptomatic management with IV fluids, PPI, and anti emetics.  If abd pain, will get [place] abd.  Liver function panel wnl.  On exam , she does not have any abd pain.     Hypertension:  bp parameters were borderline on arrival. Currently normalized.  Hold bp meds at this time.   Atrial fibrillation:  Rate controlled.  On cardizem and amiodarone. Not on anti coagulation, just on aspirin , which is being held for nausea and vomiting.    COPD: No wheezing heard and resume home meds.    Myasthenia gravis: Resume home meds.    GERD" As per the patient worse than baseline, increased the dose of protonix to 40 mg BID.    DEPRESSION: Stable. Resume home medications.    Elevated BUN and hyponatremia: Possibly from  dehydration. NS fluids started.      DVT prophylaxis: lovenox.  Code Status:DNR Family Communication: SON AT BEDSIDE.  Disposition Plan: probably back to the facility when her symptoms improve) Consults called: none Admission status:  obs medical floor   Kathlen Mody MD [facility] Pager [phone]  If 7PM-7AM, please contact night-coverage [email or web address] Password [email or web address]  07/17/2017, 7:01 PM

## 2017-07-17 NOTE — ED Notes (Signed)
ED TO INPATIENT HANDOFF REPORT  Name/Age/Gender Paula Bird 84 y.o. female  Code Status Code Status History    Date Active Date Inactive Code Status Order ID Comments User Context   03/05/2017 0109 03/13/2017 2131 DNR [id number]  Rise Patience, MD ED   10/08/2015 2314 10/10/2015 2049 DNR [id number]  Etta Quill, DO ED   08/08/2015 1250 08/16/2015 1928 DNR [id number]  Kelvin Cellar, MD Inpatient   08/04/2015 1905 08/06/2015 1342 DNR [id number]  Vianne Bulls, MD ED    Questions for Most Recent Historical Code Status (Order [id number])    Question Answer Comment   In the event of cardiac or respiratory ARREST Do not call a "code blue"    In the event of cardiac or respiratory ARREST Do not perform Intubation, CPR, defibrillation or ACLS    In the event of cardiac or respiratory ARREST Use medication by any route, position, wound care, and other measures to relive pain and suffering. May use oxygen, suction and manual treatment of airway obstruction as needed for comfort.         Advance Directive Documentation     Most Recent Value  Type of Advance Directive  Out of facility DNR (pink MOST or yellow form)  Pre-existing out of facility DNR order (yellow form or pink MOST form)  Yellow form placed in chart (order not valid for inpatient use)  "MOST" Form in Place?  -      Home/SNF/Other Nursing Home  Chief Complaint nausea; vomiting; fever  Level of Care/Admitting Diagnosis ED Disposition    ED Disposition Condition Comment   Admit  The patient appears reasonably stabilized for admission considering the current resources, flow, and capabilities available in the ED at this time, and I doubt any other [facility] requiring further screening and/or treatment in the ED prior to admission is  present.       Medical History Past Medical History:  Diagnosis Date  . Acute and chronic respiratory failure, unspecified whether with hypoxia or hypercapnia ([place])   . Afib ([place])   .  Allergic rhinitis   . Anemia   . CKD (chronic kidney disease)   . Constipation   . COPD (chronic obstructive pulmonary disease) ([place])   . Depression   . Diplopia   . Dysphagia   . Edema, lower extremity   . Esophageal dysmotility   . Gait instability   . GERD (gastroesophageal reflux disease)   . Hypertension   . Hypothyroidism   . IBS (irritable bowel syndrome)   . IDA (iron deficiency anemia)   . Insomnia   . Leukocytosis   . Myasthenia gravis ([place])   . Oral thrush   . PAF (paroxysmal atrial fibrillation) ([place])   . Physical deconditioning   . Pneumonia 02/2017  . Protein calorie malnutrition ([place])   . Thyroid disease   . Thyroid mass   . Vertigo     Allergies No Known Allergies  IV Location/Drains/Wounds Patient Lines/Drains/Airways Status   Active Line/Drains/Airways    Name:   Placement date:   Placement time:   Site:   Days:   Peripheral IV 07/17/17 Left Hand   07/17/17    1150    Hand   less than 1   External Urinary Catheter   03/05/17    2150    -   134          Labs/Imaging Results for orders placed or performed during the hospital encounter of 07/17/17 (from the past 48  hour(s))  Comprehensive metabolic panel     Status: Abnormal   Collection Time: 07/17/17 11:34 AM  Result Value Ref Range   Sodium 133 (L) 135 - 145 mmol/L   Potassium 3.6 3.5 - 5.1 mmol/L   Chloride 102 101 - 111 mmol/L   CO2 22 22 - 32 mmol/L   Glucose, Bld 135 (H) 65 - 99 mg/dL   BUN 22 (H) 6 - 20 mg/dL   Creatinine, Ser 0.58 0.44 - 1.00 mg/dL   Calcium 8.8 (L) 8.9 - 10.3 mg/dL   Total Protein 6.9 6.5 - 8.1 g/dL   Albumin 3.6 3.5 - 5.0 g/dL   AST 18 15 - 41 U/L   ALT 23 14 - 54 U/L   Alkaline Phosphatase 52 38 - 126 U/L   Total Bilirubin 0.6 0.3 - 1.2 mg/dL   GFR calc non Af Amer >60 >60 mL/min   GFR calc Af Amer >60 >60 mL/min    Comment: (NOTE) The eGFR has been calculated using the CKD EPI equation. This calculation has not been validated in all clinical  situations. eGFR's persistently <60 mL/min signify possible Chronic Kidney Disease.    Anion gap 9 5 - 15    Comment: Performed at [facility], [place] [address], [place], [place] [address]  CBC WITH DIFFERENTIAL     Status: Abnormal   Collection Time: 07/17/17 11:34 AM  Result Value Ref Range   WBC 7.3 4.0 - 10.5 K/uL   RBC 4.35 3.87 - 5.11 MIL/uL   Hemoglobin 12.3 12.0 - 15.0 g/dL   HCT 38.8 36.0 - 46.0 %   MCV 89.2 78.0 - 100.0 fL   MCH 28.3 26.0 - 34.0 pg   MCHC 31.7 30.0 - 36.0 g/dL   RDW 13.2 11.5 - 15.5 %   Platelets 177 150 - 400 K/uL   Neutrophils Relative % 73 %   Neutro Abs 5.3 1.7 - 7.7 K/uL   Lymphocytes Relative 6 %   Lymphs Abs 0.4 (L) 0.7 - 4.0 K/uL   Monocytes Relative 21 %   Monocytes Absolute 1.5 (H) 0.1 - 1.0 K/uL   Eosinophils Relative 0 %   Eosinophils Absolute 0.0 0.0 - 0.7 K/uL   Basophils Relative 0 %   Basophils Absolute 0.0 0.0 - 0.1 K/uL    Comment: Performed at [facility], [place] [address], [place], [place] [address]  I-Stat CG4 Lactic Acid, ED  (not at  [facility])     Status: None   Collection Time: 07/17/17 11:58 AM  Result Value Ref Range   Lactic Acid, Venous 1.43 0.5 - 1.9 mmol/L  Urinalysis, Routine w reflex microscopic     Status: Abnormal   Collection Time: 07/17/17  3:31 PM  Result Value Ref Range   Color, Urine YELLOW YELLOW   APPearance HAZY (A) CLEAR   Specific Gravity, Urine 1.010 1.005 - 1.030   pH 7.0 5.0 - 8.0   Glucose, UA NEGATIVE NEGATIVE mg/dL   Hgb urine dipstick SMALL (A) NEGATIVE   Bilirubin Urine NEGATIVE NEGATIVE   Ketones, ur NEGATIVE NEGATIVE mg/dL   Protein, ur NEGATIVE NEGATIVE mg/dL   Nitrite NEGATIVE NEGATIVE   Leukocytes, UA MODERATE (A) NEGATIVE   RBC / HPF 0-5 0 - 5 RBC/hpf   WBC, UA 21-50 0 - 5 WBC/hpf   Bacteria, UA FEW (A) NONE SEEN   Squamous Epithelial / LPF 0-5 0 - 5    Comment: Please note change in reference range.  Hyaline Casts, UA PRESENT     Comment:  Performed at [facility], [place] [address] [place],  [address]   Dg Chest 2 View  Result Date: 07/17/2017 CLINICAL DATA:  Cough and shortness of breath. EXAM: CHEST - 2 VIEW COMPARISON:  04/22/2017 FINDINGS: Lungs are hyperexpanded. Interstitial markings are diffusely coarsened with chronic features. Heart size upper normal. Stable basilar scarring bilaterally. Bones are diffusely demineralized. Telemetry leads overlie the chest. IMPRESSION: Stable.  Hyperexpansion without acute cardiopulmonary findings. Electronically Signed   By: Misty Stanley M.D.   On: 07/17/2017 13:10    Pending Labs Unresulted Labs (From admission, onward)   Start     Ordered   07/17/17 1134  Blood Culture (routine x 2)  BLOOD CULTURE X 2,   STAT     07/17/17 1134   07/17/17 1134  Urine culture  STAT,   STAT     07/17/17 1134      Vitals/Pain Today's Vitals   07/17/17 1531 07/17/17 1619 07/17/17 1730 07/17/17 1732  BP: 103/72  (!) 108/55   Pulse: 84  74   Resp: 18  (!) 22   Temp:      TempSrc:      SpO2: 99%  96%   Weight:      Height:      PainSc:  0-No pain  Asleep    Isolation Precautions No active isolations  Medications Medications  sodium chloride 0.9 % bolus 1,000 mL (0 mLs Intravenous Stopped 07/17/17 1230)    And  sodium chloride 0.9 % bolus 1,000 mL (0 mLs Intravenous Stopped 07/17/17 1324)  ondansetron (ZOFRAN) injection 4 mg (4 mg Intravenous Given 07/17/17 1200)  acetaminophen (TYLENOL) suppository 650 mg (650 mg Rectal Given 07/17/17 1200)  piperacillin-tazobactam (ZOSYN) IVPB 3.375 g (0 g Intravenous Stopped 07/17/17 1334)    Mobility walks with device

## 2017-07-17 NOTE — ED Provider Notes (Signed)
[facility]-EMERGENCY DEPT Provider Note   CSN: [id number] Arrival date & time: 07/17/17  1054     History   Chief Complaint Chief Complaint  Patient presents with  . Fever  . Nausea  . Emesis    HPI Paula Bird is a 84 y.o. female.  Patient is an 84 year old female with a history of COPD, CKD, atrial fibrillation, GERD, hypertension, constipation on MiraLAX daily, myasthenia gravis who is presenting today with ongoing illness.  Patient's was here on 07/04/2017 with a complaint of nausea, vomiting and diarrhea.  She states since that time her symptoms have persisted.  Of the 13 days since she has been here she has vomited every day except 2.  This morning she had a temperature of 102 as well.  She states the diarrhea is starting to improve and is only a small amount now but the ongoing epigastric discomfort and vomiting has continued.  She is also had a cough which she states has been present since she was diagnosed with pneumonia in January.  She is also having some slight shortness of breath but denies any chest pain.  She has not had any dysuria or frequency.  She states when she tries to eat she just vomits back up so has not been eating or drinking.  She feels globally weak.  Unclear how long patient symptoms have gone on she initially states it has been 2 weeks however when she was seen on the 11th of this month at that time she stated she had had symptoms for 2 weeks.  Patient did have multiple sick contacts and during her emergency room evaluation was diagnosed with a viral illness and discharged home.  During that visit patient had reassuring lab work and a negative CT.  No evidence of urinary tract infection at that time.  The history is provided by the patient.  Fever   This is a new problem. The current episode started yesterday. The problem occurs constantly. The problem has been gradually worsening. The maximum temperature noted was 102 to 102.9 F. The  temperature was taken using an oral thermometer. Associated symptoms include diarrhea, vomiting and cough. Associated symptoms comments: Sob.  No urinary complaints. She has tried nothing for the symptoms. The treatment provided no relief.  Emesis   Associated symptoms include cough, diarrhea and a fever.    Past Medical History:  Diagnosis Date  . Acute and chronic respiratory failure, unspecified whether with hypoxia or hypercapnia (HCC)   . Afib (HCC)   . Allergic rhinitis   . Anemia   . CKD (chronic kidney disease)   . Constipation   . COPD (chronic obstructive pulmonary disease) (HCC)   . Depression   . Diplopia   . Dysphagia   . Edema, lower extremity   . Esophageal dysmotility   . Gait instability   . GERD (gastroesophageal reflux disease)   . Hypertension   . Hypothyroidism   . IBS (irritable bowel syndrome)   . IDA (iron deficiency anemia)   . Insomnia   . Leukocytosis   . Myasthenia gravis (HCC)   . Oral thrush   . PAF (paroxysmal atrial fibrillation) (HCC)   . Physical deconditioning   . Pneumonia 02/2017  . Protein calorie malnutrition (HCC)   . Thyroid disease   . Thyroid mass   . Vertigo     Patient Active Problem List   Diagnosis Date Noted  . Infection due to parainfluenza virus 4 03/06/2017  . Gait abnormality  06/07/2016  . Hyperthyroidism 04/24/2016  . Myasthenia gravis (HCC) 02/07/2016  . Ptosis of eyelid, right 01/31/2016  . Pain, eye, right 01/31/2016  . Falling 01/31/2016  . Vertigo 10/08/2015  . Colon polyp 08/30/2015  . Arthritis, degenerative 08/30/2015  . Acid indigestion 08/30/2015  . Psoriasis 08/30/2015  . Avitaminosis D 08/30/2015  . HCAP (healthcare-associated pneumonia) 08/08/2015  . Respiratory distress 08/08/2015  . Essential hypertension 08/08/2015  . COPD exacerbation (HCC) 08/08/2015  . Dyspnea 08/04/2015  . Hypertension 08/04/2015  . Depression 08/04/2015  . Insomnia 08/04/2015  . Obstructive chronic bronchitis with  acute exacerbation (HCC) 08/04/2015  . Thrombocytopenia (HCC) 08/04/2015  . Paroxysmal atrial fibrillation (HCC) 08/04/2015  . Acute exacerbation of chronic obstructive bronchitis (HCC) 08/04/2015  . Infection of urinary tract 07/01/2014  . Bradycardia 11/24/2013  . Fall in home 11/22/2013  . Pulmonary hypertension (HCC) 11/19/2013  . Excessive falling 09/07/2012  . Encounter for general adult medical examination without abnormal findings 03/24/2012  . Atrial paroxysmal tachycardia (HCC) 10/05/2011  . Family history of colon cancer 05/15/2011  . Mass of pelvis 05/15/2011    Past Surgical History:  Procedure Laterality Date  . ABDOMINAL HYSTERECTOMY    . APPENDECTOMY    . TOTAL HIP ARTHROPLASTY Left   . TOTAL KNEE ARTHROPLASTY Left      OB History   None      Home Medications    Prior to Admission medications   Medication Sig Start Date End Date Taking? Authorizing Provider  amiodarone (PACERONE) 200 MG tablet Take 200 mg by mouth daily. 06/07/17   [provider]  amitriptyline (ELAVIL) 50 MG tablet Take 50 mg by mouth at bedtime. 06/22/17   [provider]  aspirin EC 81 MG tablet Take 81 mg by mouth daily.    [provider]  benzonatate (TESSALON) 100 MG capsule Take 1 capsule (100 mg total) by mouth every 8 (eight) hours. 04/22/17   Linwood Dibbles, MD  bisacodyl (DULCOLAX) 10 MG suppository Place 10 mg rectally daily as needed for moderate constipation.    [provider]  BREO ELLIPTA 100-25 MCG/INH AEPB Take 1 puff by mouth daily. 06/10/17   [provider]  calcium-vitamin D (OSCAL WITH D) 500-200 MG-UNIT tablet Take 1 tablet by mouth 2 (two) times daily.    [provider]  carboxymethylcellulose (REFRESH PLUS) 0.5 % SOLN Place 2 drops into both eyes 2 (two) times daily as needed (dry eyes).    [provider]  Cholecalciferol (VITAMIN D3) 2000 units TABS Take 2,000 Units by mouth daily.     [provider]  Cranberry 425 MG CAPS Take 425 mg by mouth 2 (two) times daily.    [provider]  diltiazem (CARDIZEM CD) 300 MG 24 hr capsule Take 300 mg by mouth daily. 03/04/16   [provider]  diltiazem (TIAZAC) 300 MG 24 hr capsule Take 300 mg by mouth daily. 06/24/17   [provider]  esomeprazole (NEXIUM) 20 MG capsule Take 20 mg by mouth 2 (two) times daily.     [provider]  guaiFENesin (MUCINEX) 600 MG 12 hr tablet Take 1 tablet (600 mg total) by mouth 2 (two) times daily. For 5days Patient not taking: Reported on 04/18/2017 03/13/17   Zannie Cove, MD  hydrochlorothiazide (HYDRODIURIL) 25 MG tablet Take 1 tablet (25 mg total) by mouth every Monday, Wednesday, and Friday. 08/16/15   [name], MD  HYDROcodone-acetaminophen (NORCO/VICODIN) 5-325 MG tablet Take 1 tablet by  mouth every 6 (six) hours as needed for moderate pain. For pain 03/13/17   Zannie Cove, MD  INCRUSE ELLIPTA 62.5 MCG/INH AEPB Take 1 puff by mouth daily. 06/10/17   [provider]  lisinopril (PRINIVIL,ZESTRIL) 40 MG tablet Take 40 mg by mouth daily. 03/04/16   [provider]  Multiple Vitamins-Minerals (MULTIVITAMIN GUMMIES ADULT PO) Take 1 tablet by mouth daily.    [provider]  mycophenolate (CELLCEPT) 500 MG tablet Take 1 tablet (500 mg total) by mouth 2 (two) times daily. 10/16/16   Levert Feinstein, MD  ondansetron (ZOFRAN) 4 MG tablet Take 1 tablet (4 mg total) by mouth every 8 (eight) hours as needed for nausea or vomiting. 07/04/17   Couture, Cortni S, PA-C  polyethylene glycol (MIRALAX / GLYCOLAX) packet Take 17 g by mouth daily. Patient taking differently: Take 17 g by mouth 2 (two) times daily.  08/27/14   Arthor Captain, PA-C  promethazine (PHENERGAN) 25 MG tablet Take 25 mg by mouth every 6 (six) hours as needed. for GERD 07/03/17   [provider]  senna (SENEXON) 8.6 MG tablet Take 2 tablets by mouth 2 (two) times daily.    [provider]  sennosides-docusate sodium (SENOKOT-S) 8.6-50 MG tablet Take 1 tablet by mouth 2 (two) times daily. 08/16/15   [name], MD  spironolactone (ALDACTONE) 25 MG tablet Take 25 mg by mouth daily.    [provider]  SUMAtriptan (IMITREX) 25 MG tablet Take 50 mg by mouth daily as needed for migraine. May repeat in 2 hours if headache persists or recurs.    [provider]  TRINTELLIX 10 MG TABS tablet Take 10 mg by mouth daily. 06/07/17   [provider]  zolpidem (AMBIEN) 5 MG tablet 5 mg at bedtime. 03/29/17   [provider]    Family History Family History  Problem Relation Age of Onset  . Hypertension Mother   . Hypertension Son   . Diabetes Neg Hx   . Thyroid disease Neg Hx     Social History Social History   Tobacco Use  . Smoking status: Never Smoker  . Smokeless tobacco: Never Used  Substance Use Topics  . Alcohol use: No  . Drug use: No     Allergies   Patient has no known allergies.   Review of Systems Review of Systems  Constitutional: Positive for fever.  Respiratory: Positive for cough.   Gastrointestinal: Positive for diarrhea and vomiting.  All other systems reviewed and are negative.    Physical Exam Updated Vital Signs BP (!) 135/57 (BP Location: Left Arm)   Pulse 95   Temp (!) 101.8 F (38.8 C) (Rectal)   Resp 16   Ht 5' (1.524 m)   Wt 63.5 kg (140 lb)   SpO2 92% Comment: 2LNC APPLIED FOR SUPPORT  BMI 27.34 kg/m   Physical Exam  Constitutional: She is oriented to person, place, and time. She appears well-developed and well-nourished. No distress.  HENT:  Head: Normocephalic and atraumatic.  Mouth/Throat: Oropharynx is clear and moist. Mucous membranes are dry.  Eyes: Pupils are equal, round, and reactive to light. Conjunctivae and EOM are normal.  Neck: Normal range of motion. Neck supple.  Cardiovascular: Regular rhythm and intact distal pulses. Tachycardia present.  No murmur  heard. Pulmonary/Chest: Effort normal and breath sounds normal. No respiratory distress. She has no wheezes. She has no rales.  Abdominal: Soft. She exhibits no distension. There is tenderness in the epigastric area. There  is no rebound and no guarding.  Musculoskeletal: Normal range of motion. She exhibits no edema or tenderness.  Neurological: She is alert and oriented to person, place, and time.  Skin: Skin is warm and dry. No rash noted. No erythema.  Poor skin turgor  Psychiatric: She has a normal mood and affect. Her behavior is normal.  Nursing note and vitals reviewed.    ED Treatments / Results  Labs (all labs ordered are listed, but only abnormal results are displayed) Labs Reviewed  COMPREHENSIVE METABOLIC PANEL - Abnormal; Notable for the following components:      Result Value   Sodium 133 (*)    Glucose, Bld 135 (*)    BUN 22 (*)    Calcium 8.8 (*)    All other components within normal limits  CBC WITH DIFFERENTIAL/PLATELET - Abnormal; Notable for the following components:   Lymphs Abs 0.4 (*)    Monocytes Absolute 1.5 (*)    All other components within normal limits  CULTURE, BLOOD (ROUTINE X 2)  CULTURE, BLOOD (ROUTINE X 2)  URINE CULTURE  URINALYSIS, ROUTINE W REFLEX MICROSCOPIC  I-STAT CG4 LACTIC ACID, ED    EKG EKG Interpretation  Date/Time:  Wednesday July 17 2017 12:13:48 EDT Ventricular Rate:  95 PR Interval:    QRS Duration: 86 QT Interval:  329 QTC Calculation: 414 R Axis:   -23 Text Interpretation:  Sinus rhythm Prolonged PR interval Borderline left axis deviation Low voltage, precordial leads No significant change since last tracing Confirmed by Gwyneth SproutPlunkett, Lovenia Debruler ([id number]) on 07/17/2017 12:25:30 PM   Radiology No results found.  Procedures Procedures (including critical care time)  Medications Ordered in ED Medications  sodium chloride 0.9 % bolus 1,000 mL (has no administration in time range)    And  sodium chloride 0.9 % bolus 1,000  mL (has no administration in time range)  ondansetron (ZOFRAN) injection 4 mg (has no administration in time range)  acetaminophen (TYLENOL) suppository 650 mg (has no administration in time range)     Initial Impression / Assessment and Plan / ED Course  I have reviewed the triage vital signs and the nursing notes.  Pertinent labs & imaging results that were available during my care of the patient were reviewed by me and considered in my medical decision making (see chart for details).     Elderly female presenting today with symptoms concerning for a code sepsis.  She is febrile here and appears dehydrated.  She does have epigastric discomfort but no localized pain concerning more for diverticulitis, appendicitis, cholecystitis.  Patient does have a cough and feels mildly short of breath and will ensure she does not have pneumonia.  Breath sounds are clear on exam and oxygen saturation are in the mid 90s.  Will repeat lab work to ensure no acute kidney disease.  Sepsis protocol initiated and patient given 30/kg of fluid which equals 2 L of normal saline.  Patient also given Tylenol for her fever.  12:45 PM Lactate, CBC, CMP without acute findings.  Patient's chest x-ray without evidence of pneumonia.  Will cover patient broadly for an intra-abdominal process with Zosyn.  Patient receives IV fluids.  Urine is still pending.   Final Clinical Impressions(s) / ED Diagnoses   Final diagnoses:  None    ED Discharge Orders    None       Gwyneth SproutPlunkett, Shaquilla Kehres, MD 07/17/17 1556

## 2017-07-17 NOTE — ED Notes (Signed)
Patient transported to X-ray 

## 2017-07-17 NOTE — ED Provider Notes (Signed)
4:00 PM Care assumed from Dr. Anitra LauthPlunkett.  At time of transfer of care, patient is awaiting urinalysis.  Patient will be admitted for intolerance of p.o. for several weeks as well as her sepsis of unknown origin.  Patient was also placed on nasal cannula oxygen supplementation for hypoxia which she does not normally have.  Previous team was awaiting urinalysis before calling for admission.  5:40 PM Urinalysis returned showing evidence of urinary tract infection.  Patient has leukocytes and bacteria with no squamous cells.  Given the patient's symptoms, fever, and her abdominal symptoms, patient will be treated for UTI.  Next  Given the patient's old medical picture including the history of myasthenia gravis, patient will be admitted for further management of her intolerance of p.o., dehydration, and UTI.   Clinical Impression: 1. Acute cystitis with hematuria   2. Intractable vomiting with nausea, unspecified vomiting type   3. Fever, unspecified fever cause   4. Dehydration     Disposition: Admit  This note was prepared with assistance of Dragon voice recognition software. Occasional wrong-word or sound-a-like substitutions may have occurred due to the inherent limitations of voice recognition software.     Keeghan Mcintire, Canary Brimhristopher J, MD 07/17/17 [phone]

## 2017-07-17 NOTE — ED Notes (Signed)
BLOOD CULTURE X2 EACH LEFT HAND

## 2017-07-17 NOTE — Progress Notes (Signed)
A consult was received from an ED physician for Zosyn per pharmacy dosing.  The patient's profile has been reviewed for ht/wt/allergies/indication/available labs.    A one time order has already been placed by ED provider for Zosyn 3.375g IV (30 minute infusion).  Further antibiotics/pharmacy consults should be ordered by admitting physician if indicated.                       Thank you, Jamse MeadGadhia, Chisa Kushner M 07/17/2017  12:58 PM

## 2017-07-17 NOTE — ED Notes (Signed)
Bed: WA20 Expected date:  Expected time:  Means of arrival:  Comments: NO BED

## 2017-07-17 NOTE — ED Triage Notes (Addendum)
Per [facility]- DNR at bedside. [facility]. Recently evaluated for presenting complaint.  Pt here for continued worsening symptoms and fever of 102.2 oral per staff. Pt declined tylenol. N/V last night. 3-4 times in 24 hours. Denies diarrhea in 2 days. Dx viral illness last visit

## 2017-07-17 NOTE — ED Notes (Signed)
BLOOD CULTURE X 1 EACH LEFT FOREARM

## 2017-07-17 NOTE — ED Notes (Signed)
ED Provider at bedside. EDP PLUNKETT 

## 2017-07-18 DIAGNOSIS — D649 Anemia, unspecified: Secondary | ICD-10-CM | POA: Diagnosis present

## 2017-07-18 DIAGNOSIS — K219 Gastro-esophageal reflux disease without esophagitis: Secondary | ICD-10-CM | POA: Diagnosis present

## 2017-07-18 DIAGNOSIS — I129 Hypertensive chronic kidney disease with stage 1 through stage 4 chronic kidney disease, or unspecified chronic kidney disease: Secondary | ICD-10-CM | POA: Diagnosis present

## 2017-07-18 DIAGNOSIS — A4151 Sepsis due to Escherichia coli [E. coli]: Secondary | ICD-10-CM | POA: Diagnosis present

## 2017-07-18 DIAGNOSIS — K30 Functional dyspepsia: Secondary | ICD-10-CM | POA: Diagnosis present

## 2017-07-18 DIAGNOSIS — N3000 Acute cystitis without hematuria: Secondary | ICD-10-CM | POA: Diagnosis present

## 2017-07-18 DIAGNOSIS — N189 Chronic kidney disease, unspecified: Secondary | ICD-10-CM | POA: Diagnosis present

## 2017-07-18 DIAGNOSIS — Z96652 Presence of left artificial knee joint: Secondary | ICD-10-CM | POA: Diagnosis present

## 2017-07-18 DIAGNOSIS — F329 Major depressive disorder, single episode, unspecified: Secondary | ICD-10-CM | POA: Diagnosis present

## 2017-07-18 DIAGNOSIS — K580 Irritable bowel syndrome with diarrhea: Secondary | ICD-10-CM | POA: Diagnosis present

## 2017-07-18 DIAGNOSIS — Z90710 Acquired absence of both cervix and uterus: Secondary | ICD-10-CM | POA: Diagnosis not present

## 2017-07-18 DIAGNOSIS — E039 Hypothyroidism, unspecified: Secondary | ICD-10-CM | POA: Diagnosis present

## 2017-07-18 DIAGNOSIS — N3001 Acute cystitis with hematuria: Secondary | ICD-10-CM | POA: Diagnosis not present

## 2017-07-18 DIAGNOSIS — G7000 Myasthenia gravis without (acute) exacerbation: Secondary | ICD-10-CM | POA: Diagnosis present

## 2017-07-18 DIAGNOSIS — E860 Dehydration: Secondary | ICD-10-CM | POA: Diagnosis present

## 2017-07-18 DIAGNOSIS — E871 Hypo-osmolality and hyponatremia: Secondary | ICD-10-CM | POA: Diagnosis present

## 2017-07-18 DIAGNOSIS — I10 Essential (primary) hypertension: Secondary | ICD-10-CM | POA: Diagnosis not present

## 2017-07-18 DIAGNOSIS — R112 Nausea with vomiting, unspecified: Secondary | ICD-10-CM | POA: Diagnosis not present

## 2017-07-18 DIAGNOSIS — K2970 Gastritis, unspecified, without bleeding: Secondary | ICD-10-CM | POA: Diagnosis present

## 2017-07-18 DIAGNOSIS — K259 Gastric ulcer, unspecified as acute or chronic, without hemorrhage or perforation: Secondary | ICD-10-CM | POA: Diagnosis present

## 2017-07-18 DIAGNOSIS — Z96642 Presence of left artificial hip joint: Secondary | ICD-10-CM | POA: Diagnosis present

## 2017-07-18 DIAGNOSIS — I480 Paroxysmal atrial fibrillation: Secondary | ICD-10-CM | POA: Diagnosis present

## 2017-07-18 DIAGNOSIS — J441 Chronic obstructive pulmonary disease with (acute) exacerbation: Secondary | ICD-10-CM | POA: Diagnosis present

## 2017-07-18 DIAGNOSIS — K317 Polyp of stomach and duodenum: Secondary | ICD-10-CM | POA: Diagnosis present

## 2017-07-18 DIAGNOSIS — Z66 Do not resuscitate: Secondary | ICD-10-CM | POA: Diagnosis present

## 2017-07-18 LAB — BLOOD CULTURE ID PANEL (REFLEXED)
Acinetobacter baumannii: NOT DETECTED
CANDIDA GLABRATA: NOT DETECTED
CANDIDA PARAPSILOSIS: NOT DETECTED
CANDIDA TROPICALIS: NOT DETECTED
Candida albicans: NOT DETECTED
Candida krusei: NOT DETECTED
Carbapenem resistance: NOT DETECTED
ENTEROBACTER CLOACAE COMPLEX: NOT DETECTED
Enterobacteriaceae species: DETECTED — AB
Enterococcus species: NOT DETECTED
Escherichia coli: DETECTED — AB
HAEMOPHILUS INFLUENZAE: NOT DETECTED
KLEBSIELLA PNEUMONIAE: NOT DETECTED
Klebsiella oxytoca: NOT DETECTED
Listeria monocytogenes: NOT DETECTED
Neisseria meningitidis: NOT DETECTED
PROTEUS SPECIES: NOT DETECTED
Pseudomonas aeruginosa: NOT DETECTED
SERRATIA MARCESCENS: NOT DETECTED
STAPHYLOCOCCUS SPECIES: NOT DETECTED
Staphylococcus aureus (BCID): NOT DETECTED
Streptococcus agalactiae: NOT DETECTED
Streptococcus pneumoniae: NOT DETECTED
Streptococcus pyogenes: NOT DETECTED
Streptococcus species: NOT DETECTED

## 2017-07-18 LAB — BASIC METABOLIC PANEL
ANION GAP: 8 (ref 5–15)
BUN: 14 mg/dL (ref 6–20)
CALCIUM: 8.3 mg/dL — AB (ref 8.9–10.3)
CO2: 22 mmol/L (ref 22–32)
Chloride: 110 mmol/L (ref 101–111)
Creatinine, Ser: 0.52 mg/dL (ref 0.44–1.00)
GFR calc Af Amer: >60 mL/min (ref >60)
GFR calc non Af Amer: >60 mL/min (ref >60)
GLUCOSE: 108 mg/dL — AB (ref 65–99)
Potassium: 3.7 mmol/L (ref 3.5–5.1)
Sodium: 140 mmol/L (ref 135–145)

## 2017-07-18 LAB — CBC
HEMATOCRIT: 35 % — AB (ref 36.0–46.0)
Hemoglobin: 10.9 g/dL — ABNORMAL LOW (ref 12.0–15.0)
MCH: 28.2 pg (ref 26.0–34.0)
MCHC: 31.1 g/dL (ref 30.0–36.0)
MCV: 90.4 fL (ref 78.0–100.0)
PLATELETS: 153 10*3/uL (ref 150–400)
RBC: 3.87 MIL/uL (ref 3.87–5.11)
RDW: 13.5 % (ref 11.5–15.5)
WBC: 9.6 10*3/uL (ref 4.0–10.5)

## 2017-07-18 LAB — MRSA PCR SCREENING: MRSA BY PCR: NEGATIVE

## 2017-07-18 MED ORDER — FLUTICASONE FUROATE-VILANTEROL 100-25 MCG/INH IN AEPB
1.0000 | INHALATION_SPRAY | Freq: Every day | RESPIRATORY_TRACT | Status: DC
  Administered 2017-07-18 – 2017-07-23 (×6): 1 via RESPIRATORY_TRACT
  Filled 2017-07-18: qty 28

## 2017-07-18 MED ORDER — ZOLPIDEM TARTRATE 5 MG PO TABS
5.0000 mg | ORAL_TABLET | Freq: Every day | ORAL | Status: DC
  Administered 2017-07-18 – 2017-07-22 (×5): 5 mg via ORAL
  Filled 2017-07-18 (×5): qty 1

## 2017-07-18 MED ORDER — VORTIOXETINE HBR 10 MG PO TABS
10.0000 mg | ORAL_TABLET | Freq: Every day | ORAL | Status: DC
  Administered 2017-07-18 – 2017-07-23 (×5): 10 mg via ORAL
  Filled 2017-07-18 (×6): qty 1

## 2017-07-18 MED ORDER — ENOXAPARIN SODIUM 40 MG/0.4ML ~~LOC~~ SOLN
40.0000 mg | SUBCUTANEOUS | Status: DC
  Administered 2017-07-18 – 2017-07-23 (×6): 40 mg via SUBCUTANEOUS
  Filled 2017-07-18 (×6): qty 0.4

## 2017-07-18 MED ORDER — SODIUM CHLORIDE 0.9 % IV SOLN
2.0000 g | INTRAVENOUS | Status: DC
  Administered 2017-07-18 – 2017-07-19 (×2): 2 g via INTRAVENOUS
  Filled 2017-07-18: qty 20
  Filled 2017-07-18 (×2): qty 2

## 2017-07-18 MED ORDER — AMIODARONE HCL 200 MG PO TABS
200.0000 mg | ORAL_TABLET | Freq: Every day | ORAL | Status: DC
  Administered 2017-07-18 – 2017-07-23 (×5): 200 mg via ORAL
  Filled 2017-07-18 (×6): qty 1

## 2017-07-18 MED ORDER — AMITRIPTYLINE HCL 50 MG PO TABS
50.0000 mg | ORAL_TABLET | Freq: Every day | ORAL | Status: DC
  Administered 2017-07-18 – 2017-07-22 (×6): 50 mg via ORAL
  Filled 2017-07-18: qty 2
  Filled 2017-07-18 (×3): qty 1
  Filled 2017-07-18 (×2): qty 2

## 2017-07-18 MED ORDER — DILTIAZEM HCL ER COATED BEADS 300 MG PO CP24
300.0000 mg | ORAL_CAPSULE | Freq: Every day | ORAL | Status: DC
  Administered 2017-07-18 – 2017-07-23 (×5): 300 mg via ORAL
  Filled 2017-07-18 (×6): qty 1

## 2017-07-18 MED ORDER — HYDROCODONE-ACETAMINOPHEN 5-325 MG PO TABS
1.0000 | ORAL_TABLET | Freq: Four times a day (QID) | ORAL | Status: DC | PRN
  Administered 2017-07-18 – 2017-07-23 (×11): 1 via ORAL
  Filled 2017-07-18 (×11): qty 1

## 2017-07-18 MED ORDER — MYCOPHENOLATE MOFETIL 250 MG PO CAPS
500.0000 mg | ORAL_CAPSULE | Freq: Two times a day (BID) | ORAL | Status: DC
  Administered 2017-07-18 – 2017-07-23 (×11): 500 mg via ORAL
  Filled 2017-07-18 (×12): qty 2

## 2017-07-18 MED ORDER — SUMATRIPTAN SUCCINATE 50 MG PO TABS
50.0000 mg | ORAL_TABLET | Freq: Every day | ORAL | Status: DC | PRN
  Administered 2017-07-18 – 2017-07-19 (×2): 50 mg via ORAL
  Filled 2017-07-18 (×4): qty 1

## 2017-07-18 MED ORDER — POLYETHYLENE GLYCOL 3350 17 G PO PACK
17.0000 g | PACK | Freq: Every day | ORAL | Status: DC | PRN
  Administered 2017-07-18 – 2017-07-19 (×2): 17 g via ORAL
  Filled 2017-07-18 (×2): qty 1

## 2017-07-18 NOTE — Clinical Social Work Note (Signed)
Clinical Social Work Assessment  Patient Details  Name: Paula Bird MRN: [id number] Date of Birth: 11-30-1925  Date of referral:  07/18/17               Reason for consult:  Facility Placement                Permission sought to share information with:  Family Supports Permission granted to share information::  Yes, Verbal Permission Granted  Name::        [facility]   Relationship::  Son HCPOA/Daughter   Contact Information:     Housing/Transportation Living arrangements for the past 2 months:  Assisted Living Facility Source of Information:  Patient, Adult Children, Facility Patient Interpreter Needed:  None Criminal Activity/Legal Involvement Pertinent to Current Situation/Hospitalization:  No - Comment as needed Significant Relationships:  Merchandiser, retailCommunity Support, Adult Children, Other Family Members Lives with:  Facility Resident Do you feel safe going back to the place where you live?  Yes Need for family participation in patient care:  Yes (Comment)  Care giving concerns:   [facility] and will return at discharge   Patient reports feeling depressed.    Social Worker assessment / plan: Re: Patient alert and oriented. [facility]. Patient plans to return at discharge. Patient reports at the facility she uses a walker and wheel chair for long distances. Patient bathes and dresses self.   Re: Patient express she has been feeling "depressed." She reports since moving from her "own home and space it has been a change." Patient reports living at facility since. Patient daughter reports, "I stay to my self a lot. " She reports she does not really like to participate in the acitivites and does not like that she can no longer prepare her own meals.   CSW assessed if patient felt suicidal or had intention of harming her self.  Patient denies feeling suidcal or wanting  to harm herself.  Patient express, "I just want someone to sit down and talk with me." Patient gave CSW permission to contact her children and Paula Bird-Wellness Director at facility  to discuss a plan for patient to see a outpatient therapist/counselor, "or someone to talk to."   [place] reports the patient was seeing a counselor/psych service line before but the services stop due to lack of staff.  She reports the patient stays active at the facility but does have days where she chooses to be alone and stays in her room. She reports since the patient has requested the services again she will talk with family and staff about restarting the services for the patient.   CSW left voicemail for patient daughter who plans to visit the patient today. -CSW spoke with patient daughter Paula SquibbJane, She is familiar with depression mood. She report the patient has been at the facility for the "past three years and she has felt the same way since the first day she admitted." She is agreeable to any services the ALF can put into place.   CSW contacted patient son as well.   Plan: [facility]  Employment status:  Retired Database administratornsurance information:  Managed Medicare PT Recommendations:  Not assessed at this time Information / Referral to community resources:  Skilled Nursing Facility  Patient/Family's Response to care: Patient appreciative of CSW services.    Patient/Family's Understanding of and Emotional Response to Diagnosis, Current Treatment, and Prognosis: The patient  daughter and son are involved in her care. Patient son is HCPOA. Patient son and daughter plan to visit her today.    Emotional Assessment Appearance:  Developmentally appropriate Attitude/Demeanor/Rapport:    Affect (typically observed):  Accepting Orientation:  Oriented to Self, Oriented to Place, Oriented to  Time, Oriented to Situation Alcohol / Substance use:  Not Applicable Psych involvement (Current and /or in the community):   No (Comment)  Discharge Needs  Concerns to be addressed:  Discharge Planning Concerns Readmission within the last 30 days:  No Current discharge risk:  None Barriers to Discharge:  Continued Medical Work up   [employer], LCSW 07/18/2017, 11:57 AM

## 2017-07-18 NOTE — Progress Notes (Signed)
PROGRESS NOTE    Paula Bird  ZOX:[id number] DOB: 1925-04-28 DOA: 07/17/2017 PCP: Angela Cox, MD    Brief Narrative: Paula Bird is a 84 y.o. female with medical history significant of copd, depression, atrial fibrillation not on anticoagulation, hypertension, gerd, comes in for fever and chills. As per the patient and her son at bedside, they report persistent nausea, vomiting and diarrhea for almost 3 week now.  On arrival to ED, her labs were unremarkable except for abnormal UA.  She was referred to medical service for evaluation and management of UTI and dehydration.       Assessment & Plan:   Active Problems:   Hypertension   Depression   Paroxysmal atrial fibrillation (HCC)   COPD exacerbation (HCC)   Acid indigestion   Myasthenia gravis (HCC)   UTI (urinary tract infection)  SIRS/ Sepsis from E coli Bacteremia from UTI: - Admitted for IV antibiotics.  - follow final blood and urine culture report.  - continue with rocephin.  Gentle hydration.   Nausea vomiting and diarrhea: Diarrhea resolved.  Nausea and vomiting improved.  Able to tolerate regular diet . Use anti emetics as needed.    Atrial fib Rate controlled on amiodarone.    Copd: Stable.    Myasthenia Gravis: Resume home meds.    Hypertension Well controlled.    Dehydration: improved.   DVT prophylaxis: (LOVENOX.  Code Status: DNR Family Communication: none at bedside.  Disposition Plan: pending blood culture report.    Consultants:   None.    Procedures: none.    Antimicrobials: rocephin since admission.    Subjective: Nausea, is better and diarrhea is resolved.   Objective: Vitals:   07/18/17 0542 07/18/17 0914 07/18/17 1359 07/18/17 1615  BP: 115/82 (!) 141/76 (!) 121/54   Pulse: 72 70 66   Resp: 16  17   Temp: 98.4 F (36.9 C)   98.2 F (36.8 C)  TempSrc: Oral   Axillary  SpO2: 97%  96%   Weight:      Height:        Intake/Output Summary  (Last 24 hours) at 07/18/2017 1718 Last data filed at 07/18/2017 1700 Gross per 24 hour  Intake 1806.25 ml  Output 3050 ml  Net -1243.75 ml   Filed Weights   07/17/17 1123  Weight: 63.5 kg (140 lb)    Examination:  General exam: Appears calm and comfortable  Respiratory system: Clear to auscultation. Respiratory effort normal. Cardiovascular system: S1 & S2 heard, RRR. No JVD, murmurs, rubs, gallops or clicks. No pedal edema. Gastrointestinal system: Abdomen is nondistended, soft and nontender. No organomegaly or masses felt. Normal bowel sounds heard. Central nervous system: Alert and oriented. No focal neurological deficits. Extremities: Symmetric 5 x 5 power. Skin: No rashes, lesions or ulcers Psychiatry: Judgement and insight appear normal. Mood & affect appropriate.     Data Reviewed: I have personally reviewed following labs and imaging studies  CBC: Recent Labs  Lab 07/17/17 1134 07/18/17 0512  WBC 7.3 9.6  NEUTROABS 5.3  --   HGB 12.3 10.9*  HCT 38.8 35.0*  MCV 89.2 90.4  PLT 177 153   Basic Metabolic Panel: Recent Labs  Lab 07/17/17 1134 07/18/17 0512  NA 133* 140  K 3.6 3.7  CL 102 110  CO2 22 22  GLUCOSE 135* 108*  BUN 22* 14  CREATININE 0.58 0.52  CALCIUM 8.8* 8.3*   GFR: Estimated Creatinine Clearance: 39.7 mL/min (by C-G formula based on SCr of 0.52  mg/dL). Liver Function Tests: Recent Labs  Lab 07/17/17 1134  AST 18  ALT 23  ALKPHOS 52  BILITOT 0.6  PROT 6.9  ALBUMIN 3.6   No results for input(s): LIPASE, AMYLASE in the last 168 hours. No results for input(s): AMMONIA in the last 168 hours. Coagulation Profile: No results for input(s): INR, PROTIME in the last 168 hours. Cardiac Enzymes: No results for input(s): CKTOTAL, CKMB, CKMBINDEX, TROPONINI in the last 168 hours. BNP (last 3 results) No results for input(s): PROBNP in the last 8760 hours. HbA1C: No results for input(s): HGBA1C in the last 72 hours. CBG: No results for  input(s): GLUCAP in the last 168 hours. Lipid Profile: No results for input(s): CHOL, HDL, LDLCALC, TRIG, CHOLHDL, LDLDIRECT in the last 72 hours. Thyroid Function Tests: No results for input(s): TSH, T4TOTAL, FREET4, T3FREE, THYROIDAB in the last 72 hours. Anemia Panel: No results for input(s): VITAMINB12, FOLATE, FERRITIN, TIBC, IRON, RETICCTPCT in the last 72 hours. Sepsis Labs: Recent Labs  Lab 07/17/17 1158  LATICACIDVEN 1.43    Recent Results (from the past 240 hour(s))  Blood Culture (routine x 2)     Status: None (Preliminary result)   Collection Time: 07/17/17 11:48 AM  Result Value Ref Range Status   Specimen Description   Final    BLOOD LEFT FOREARM Performed at [facility], [address] [place], [place] [address]    Special Requests   Final    BOTTLES DRAWN AEROBIC AND ANAEROBIC Blood Culture adequate volume Performed at [facility], [address], [place], [place] [address]    Culture  Setup Time   Final    ANAEROBIC BOTTLE ONLY GRAM NEGATIVE RODS CRITICAL RESULT CALLED TO, READ BACK BY AND VERIFIED WITH: Sophronia Simas [facility] 07/18/17 0981 JDW Performed at [facility], [address], [place], [place] [address]    Culture GRAM NEGATIVE RODS  Final   Report Status PENDING  Incomplete  Blood Culture ID Panel (Reflexed)     Status: Abnormal   Collection Time: 07/17/17 11:48 AM  Result Value Ref Range Status   Enterococcus species NOT DETECTED NOT DETECTED Final   Listeria monocytogenes NOT DETECTED NOT DETECTED Final   Staphylococcus species NOT DETECTED NOT DETECTED Final   Staphylococcus aureus NOT DETECTED NOT DETECTED Final   Streptococcus species NOT DETECTED NOT DETECTED Final   Streptococcus agalactiae NOT DETECTED NOT DETECTED Final   Streptococcus pneumoniae NOT DETECTED NOT DETECTED Final   Streptococcus pyogenes NOT DETECTED NOT DETECTED Final   Acinetobacter baumannii NOT DETECTED NOT DETECTED Final    Enterobacteriaceae species DETECTED (A) NOT DETECTED Final    Comment: Enterobacteriaceae represent a large family of gram-negative bacteria, not a single organism. CRITICAL RESULT CALLED TO, READ BACK BY AND VERIFIED WITH: M BELL PHARMD 07/18/17 0306 JDW    Enterobacter cloacae complex NOT DETECTED NOT DETECTED Final   Escherichia coli DETECTED (A) NOT DETECTED Final    Comment: CRITICAL RESULT CALLED TO, READ BACK BY AND VERIFIED WITH:  M BELL PHARMD 07/18/17 0306 JDW    Klebsiella oxytoca NOT DETECTED NOT DETECTED Final   Klebsiella pneumoniae NOT DETECTED NOT DETECTED Final   Proteus species NOT DETECTED NOT DETECTED Final   Serratia marcescens NOT DETECTED NOT DETECTED Final   Carbapenem resistance NOT DETECTED NOT DETECTED Final   Haemophilus influenzae NOT DETECTED NOT DETECTED Final   Neisseria meningitidis NOT DETECTED NOT DETECTED Final   Pseudomonas aeruginosa NOT DETECTED NOT DETECTED Final  Candida albicans NOT DETECTED NOT DETECTED Final   Candida glabrata NOT DETECTED NOT DETECTED Final   Candida krusei NOT DETECTED NOT DETECTED Final   Candida parapsilosis NOT DETECTED NOT DETECTED Final   Candida tropicalis NOT DETECTED NOT DETECTED Final  Blood Culture (routine x 2)     Status: None (Preliminary result)   Collection Time: 07/17/17 11:52 AM  Result Value Ref Range Status   Specimen Description   Final    BLOOD LEFT HAND Performed at [facility], [address] [place], [place] [address]    Special Requests   Final    BOTTLES DRAWN AEROBIC AND ANAEROBIC Blood Culture adequate volume Performed at [facility], [address] [place], [place] [address]    Culture   Final    NO GROWTH 1 DAY Performed at [facility], [address] [place], [place] [address]    Report Status PENDING  Incomplete  Urine culture     Status: Abnormal (Preliminary result)   Collection Time: 07/17/17  3:31 PM  Result Value Ref Range Status    Specimen Description   Final    URINE, CLEAN CATCH Performed at [facility], [address] [place], [place] [address]    Special Requests   Final    NONE Performed at [facility], [address], [place], [place] [address]    Culture (A)  Final    >=100,000 COLONIES/mL ESCHERICHIA COLI SUSCEPTIBILITIES TO FOLLOW Performed at [facility], [address] [place], [place] [address]    Report Status PENDING  Incomplete  MRSA PCR Screening     Status: None   Collection Time: 07/18/17  1:47 AM  Result Value Ref Range Status   MRSA by PCR NEGATIVE NEGATIVE Final    Comment:        The GeneXpert MRSA Assay (FDA approved for NASAL specimens only), is one component of a comprehensive MRSA colonization surveillance program. It is not intended to diagnose MRSA infection nor to guide or monitor treatment for MRSA infections. Performed at [facility], [address], [place], [place] [address]          Radiology Studies: Dg Chest 2 View  Result Date: 07/17/2017 CLINICAL DATA:  Cough and shortness of breath. EXAM: CHEST - 2 VIEW COMPARISON:  04/22/2017 FINDINGS: Lungs are hyperexpanded. Interstitial markings are diffusely coarsened with chronic features. Heart size upper normal. Stable basilar scarring bilaterally. Bones are diffusely demineralized. Telemetry leads overlie the chest. IMPRESSION: Stable.  Hyperexpansion without acute cardiopulmonary findings. Electronically Signed   By: Kennith Center M.D.   On: 07/17/2017 13:10        Scheduled Meds: . amiodarone  200 mg Oral Daily  . amitriptyline  50 mg Oral QHS  . diltiazem  300 mg Oral Daily  . enoxaparin (LOVENOX) injection  40 mg Subcutaneous Q24H  . fluticasone furoate-vilanterol  1 puff Inhalation Daily  . mycophenolate  500 mg Oral BID  . pantoprazole  40 mg Oral BID  . vortioxetine HBr  10 mg Oral Daily  . zolpidem  5 mg Oral QHS   Continuous  Infusions: . sodium chloride 75 mL/hr at 07/17/17 2028  . cefTRIAXone (ROCEPHIN)  IV Stopped (07/18/17 1236)  . famotidine (PEPCID) IV Stopped (07/18/17 1031)     LOS: 0 days    Time spent: 35 minutes.     Kathlen Mody, MD [facility] Pager [phone]  If 7PM-7AM,  please contact night-coverage [email or web address] Password [email or web address] 07/18/2017, 5:18 PM

## 2017-07-18 NOTE — Progress Notes (Signed)
PHARMACY - PHYSICIAN COMMUNICATION CRITICAL VALUE ALERT - BLOOD CULTURE IDENTIFICATION (BCID)  Paula Bird is an 84 y.o. female [facility] with a chief complaint of N/V/fever  Assessment:  Suspected source- acute cystitis/UTI  Name of physician (or Provider) Contacted: Donnamarie PoagK. Kirby  Current antibiotics: Rocephin 1 gm q24  Changes to prescribed antibiotics recommended:  Recommendations accepted by provider- increase dose to Rocephin 2 gm IV q24  No results found for this or any previous visit.  Herby AbrahamMichelle T. Kit Brubacher, Pharm.D. [phone] 07/18/2017 3:13 AM

## 2017-07-18 NOTE — Plan of Care (Signed)
Pt alert and oriented, complaints of headache this am.  States no diarrhea in several days. RN will monitor.

## 2017-07-19 LAB — URINE CULTURE

## 2017-07-19 MED ORDER — FAMOTIDINE 20 MG PO TABS
20.0000 mg | ORAL_TABLET | Freq: Two times a day (BID) | ORAL | Status: DC
  Administered 2017-07-19 – 2017-07-20 (×3): 20 mg via ORAL
  Filled 2017-07-19 (×3): qty 1

## 2017-07-19 NOTE — NC FL2 (Signed)
[place] MEDICAID FL2 LEVEL OF CARE SCREENING TOOL     IDENTIFICATION  Patient Name: Paula Bird Birthdate: 1925-06-21 Sex: female Admission Date (Current Location): 07/17/2017  [facility] [place]   Facility and Address:  [facility],  [address], [place] [id number]      Provider Number: [id number]  Attending Physician Name and Address:  Kathlen ModyAkula, Vijaya, MD  Relative Name and Phone Number:       Current Level of Care: Hospital Recommended Level of Care: Assisted Living Facility Prior Approval Number:    Date Approved/Denied:   PASRR Number:    Discharge Plan: (Assisted Living Facility )    Current Diagnoses: Patient Active Problem List   Diagnosis Date Noted  . UTI (urinary tract infection) 07/17/2017  . Infection due to parainfluenza virus 4 03/06/2017  . Gait abnormality 06/07/2016  . Hyperthyroidism 04/24/2016  . Myasthenia gravis (HCC) 02/07/2016  . Ptosis of eyelid, right 01/31/2016  . Pain, eye, right 01/31/2016  . Falling 01/31/2016  . Vertigo 10/08/2015  . Colon polyp 08/30/2015  . Arthritis, degenerative 08/30/2015  . Acid indigestion 08/30/2015  . Psoriasis 08/30/2015  . Avitaminosis D 08/30/2015  . HCAP (healthcare-associated pneumonia) 08/08/2015  . Respiratory distress 08/08/2015  . Essential hypertension 08/08/2015  . COPD exacerbation (HCC) 08/08/2015  . Dyspnea 08/04/2015  . Hypertension 08/04/2015  . Depression 08/04/2015  . Insomnia 08/04/2015  . Obstructive chronic bronchitis with acute exacerbation (HCC) 08/04/2015  . Thrombocytopenia (HCC) 08/04/2015  . Paroxysmal atrial fibrillation (HCC) 08/04/2015  . Acute exacerbation of chronic obstructive bronchitis (HCC) 08/04/2015  . Infection of urinary tract 07/01/2014  . Bradycardia 11/24/2013  . Fall in home 11/22/2013  . Pulmonary hypertension (HCC) 11/19/2013  . Excessive falling 09/07/2012  . Encounter for general adult medical examination  without abnormal findings 03/24/2012  . Atrial paroxysmal tachycardia (HCC) 10/05/2011  . Family history of colon cancer 05/15/2011  . Mass of pelvis 05/15/2011    Orientation RESPIRATION BLADDER Height & Weight     Self, Place, Situation  Normal Continent Weight: 140 lb (63.5 kg) Height:  5' (152.4 cm)  BEHAVIORAL SYMPTOMS/MOOD NEUROLOGICAL BOWEL NUTRITION STATUS      Continent Diet(See discharge Summary )  AMBULATORY STATUS COMMUNICATION OF NEEDS Skin   Supervision Verbally Normal                       Personal Care Assistance Level of Assistance  Bathing, Feeding, Dressing Bathing Assistance: Limited assistance Feeding assistance: Independent Dressing Assistance: Limited assistance     Functional Limitations Info  Sight, Hearing, Speech Sight Info: Adequate Hearing Info: Adequate Speech Info: Adequate    SPECIAL CARE FACTORS FREQUENCY                       Contractures Contractures Info: Not present    Additional Factors Info  Code Status, Allergies, Psychotropic Code Status Info: DNR Allergies Info: Allergies: No Known Allergies           Current Medications (07/19/2017):  This is the current hospital active medication list Current Facility-Administered Medications  Medication Dose Route Frequency Provider Last Rate Last Dose  . 0.9 %  sodium chloride infusion   Intravenous Continuous Kathlen ModyAkula, Vijaya, MD 75 mL/hr at 07/19/17 0600    . amiodarone (PACERONE) tablet 200 mg  200 mg Oral Daily Kathlen ModyAkula, Vijaya, MD   200 mg at 07/19/17 1051  . amitriptyline (ELAVIL) tablet 50 mg  50  mg Oral QHS Kathlen Mody, MD   50 mg at 07/18/17 2114  . cefTRIAXone (ROCEPHIN) 2 g in sodium chloride 0.9 % 100 mL IVPB  2 g Intravenous Q24H Leda Gauze, NP 200 mL/hr at 07/19/17 1053 2 g at 07/19/17 1053  . diltiazem (CARDIZEM CD) 24 hr capsule 300 mg  300 mg Oral Daily Kathlen Mody, MD   300 mg at 07/19/17 1050  . enoxaparin (LOVENOX) injection 40 mg  40 mg  Subcutaneous Q24H Kathlen Mody, MD   40 mg at 07/19/17 1051  . famotidine (PEPCID) IVPB 20 mg premix  20 mg Intravenous Q12H Kathlen Mody, MD 100 mL/hr at 07/19/17 1054 20 mg at 07/19/17 1054  . fluticasone furoate-vilanterol (BREO ELLIPTA) 100-25 MCG/INH 1 puff  1 puff Inhalation Daily Kathlen Mody, MD   1 puff at 07/19/17 1041  . HYDROcodone-acetaminophen (NORCO/VICODIN) 5-325 MG per tablet 1 tablet  1 tablet Oral Q6H PRN Kathlen Mody, MD   1 tablet at 07/19/17 0918  . mycophenolate (CELLCEPT) capsule 500 mg  500 mg Oral BID Kathlen Mody, MD   500 mg at 07/19/17 1051  . ondansetron (ZOFRAN) injection 4 mg  4 mg Intravenous Q6H PRN Kathlen Mody, MD   4 mg at 07/18/17 1954  . pantoprazole (PROTONIX) EC tablet 40 mg  40 mg Oral BID Kathlen Mody, MD   40 mg at 07/19/17 1051  . polyethylene glycol (MIRALAX / GLYCOLAX) packet 17 g  17 g Oral Daily PRN Stevie Kern A, NP   17 g at 07/18/17 2114  . SUMAtriptan (IMITREX) tablet 50 mg  50 mg Oral Daily PRN Kathlen Mody, MD   50 mg at 07/19/17 0913  . vortioxetine HBr (TRINTELLIX) tablet 10 mg  10 mg Oral Daily Kathlen Mody, MD   10 mg at 07/19/17 1050  . zolpidem (AMBIEN) tablet 5 mg  5 mg Oral QHS Kathlen Mody, MD   5 mg at 07/18/17 2114     Discharge Medications: Please see discharge summary for a list of discharge medications.  Relevant Imaging Results:  Relevant Lab Results:   Additional Information SSN: [id number]  Clearance Coots, LCSW

## 2017-07-19 NOTE — Progress Notes (Signed)
PROGRESS NOTE    Paula Evangelistvelyn Roark  VHQ:[id number] DOB: May 12, 1925 DOA: 07/17/2017 PCP: Angela Coxasanayaka, Gayani Y, MD    Brief Narrative: Paula Bird is a 84 y.o. female with medical history significant of copd, depression, atrial fibrillation not on anticoagulation, hypertension, gerd, comes in for fever and chills. As per the patient and her son at bedside, they report persistent nausea, vomiting and diarrhea for almost 3 week now.  On arrival to ED, her labs were unremarkable except for abnormal UA.  She was referred to medical service for evaluation and management of UTI and dehydration.       Assessment & Plan:   Active Problems:   Hypertension   Depression   Paroxysmal atrial fibrillation (HCC)   COPD exacerbation (HCC)   Acid indigestion   Myasthenia gravis (HCC)   UTI (urinary tract infection)  SIRS/ Sepsis from E coli Bacteremia from UTI: - Admitted for IV antibiotics, currently on rocephin. susceptibilities are pending.  The E coli in urine is sensitive to rocephin.    Nausea vomiting and diarrhea: Diarrhea resolved.  Nausea and vomiting improved. But not completely resolved.  Able to tolerate regular diet . Use anti emetics as needed.    Atrial fib Rate controlled on amiodarone. [place] S2vasc score is 4, but she is not on anticoagulation sec to GI bleed.    Copd: Stable.    Myasthenia Gravis: Resume home meds.    Hypertension Well controlled.   Anemia: Pts baseline hemoglobin around 12, currently 10.5. She denies any bleeding or black stools. Not on anticoagulation. Suspect from hemodilution vs anemia of chronic disease.  Repeat cbc in am.    Dehydration: improved.   DVT prophylaxis: (LOVENOX.  Code Status: DNR Family Communication: discussed with daughter at bedside.   Disposition Plan: possibly back to SNF tomorrow.    Consultants:   None.    Procedures: none.    Antimicrobials: rocephin since admission.    Subjective: Nausea, is  better and diarrhea is resolved.   Objective: Vitals:   07/18/17 2221 07/19/17 0555 07/19/17 1041 07/19/17 1425  BP: 111/81 (!) 123/55  (!) 153/61  Pulse: 61 61  60  Resp: 18 18  18   Temp: 98.8 F (37.1 C) 98 F (36.7 C)  97.6 F (36.4 C)  TempSrc: Oral Oral  Oral  SpO2: 95% 95% 99% 99%  Weight:      Height:        Intake/Output Summary (Last 24 hours) at 07/19/2017 1841 Last data filed at 07/19/2017 1708 Gross per 24 hour  Intake 1483.75 ml  Output 1900 ml  Net -416.25 ml   Filed Weights   07/17/17 1123  Weight: 63.5 kg (140 lb)    Examination:  General exam: Appears calm and comfortable, not in distress.  Respiratory system: Clear to auscultation. Respiratory effort normal. No wheezing or rhonchi.  Cardiovascular system: S1 & S2 heard, RRR. No JVD, . No pedal edema. Gastrointestinal system:  Abdomen is soft non tender non distended bowel sounds heard.  Central nervous system: Alert and oriented. No focal neurological deficits. Extremities: Symmetric 5 x 5 power. Skin: No rashes, lesions or ulcers Psychiatry:  Mood & affect appropriate.     Data Reviewed: I have personally reviewed following labs and imaging studies  CBC: Recent Labs  Lab 07/17/17 1134 07/18/17 0512  WBC 7.3 9.6  NEUTROABS 5.3  --   HGB 12.3 10.9*  HCT 38.8 35.0*  MCV 89.2 90.4  PLT 177 153   Basic Metabolic Panel:  Recent Labs  Lab 07/17/17 1134 07/18/17 0512  NA 133* 140  K 3.6 3.7  CL 102 110  CO2 22 22  GLUCOSE 135* 108*  BUN 22* 14  CREATININE 0.58 0.52  CALCIUM 8.8* 8.3*   GFR: Estimated Creatinine Clearance: 39.7 mL/min (by C-G formula based on SCr of 0.52 mg/dL). Liver Function Tests: Recent Labs  Lab 07/17/17 1134  AST 18  ALT 23  ALKPHOS 52  BILITOT 0.6  PROT 6.9  ALBUMIN 3.6   No results for input(s): LIPASE, AMYLASE in the last 168 hours. No results for input(s): AMMONIA in the last 168 hours. Coagulation Profile: No results for input(s): INR, PROTIME in  the last 168 hours. Cardiac Enzymes: No results for input(s): CKTOTAL, CKMB, CKMBINDEX, TROPONINI in the last 168 hours. BNP (last 3 results) No results for input(s): PROBNP in the last 8760 hours. HbA1C: No results for input(s): HGBA1C in the last 72 hours. CBG: No results for input(s): GLUCAP in the last 168 hours. Lipid Profile: No results for input(s): CHOL, HDL, LDLCALC, TRIG, CHOLHDL, LDLDIRECT in the last 72 hours. Thyroid Function Tests: No results for input(s): TSH, T4TOTAL, FREET4, T3FREE, THYROIDAB in the last 72 hours. Anemia Panel: No results for input(s): VITAMINB12, FOLATE, FERRITIN, TIBC, IRON, RETICCTPCT in the last 72 hours. Sepsis Labs: Recent Labs  Lab 07/17/17 1158  LATICACIDVEN 1.43    Recent Results (from the past 240 hour(s))  Blood Culture (routine x 2)     Status: Abnormal (Preliminary result)   Collection Time: 07/17/17 11:48 AM  Result Value Ref Range Status   Specimen Description   Final    BLOOD LEFT FOREARM Performed at [facility], [address] [place], [place] [address]    Special Requests   Final    BOTTLES DRAWN AEROBIC AND ANAEROBIC Blood Culture adequate volume Performed at [facility], [address], [place], [place] [address]    Culture  Setup Time   Final    ANAEROBIC BOTTLE ONLY GRAM NEGATIVE RODS CRITICAL RESULT CALLED TO, READ BACK BY AND VERIFIED WITH: Sophronia Simas [facility] 07/18/17 0306 JDW    Culture (A)  Final    ESCHERICHIA COLI SUSCEPTIBILITIES TO FOLLOW Performed at [facility], [address] [place], [place] [address]    Report Status PENDING  Incomplete  Blood Culture ID Panel (Reflexed)     Status: Abnormal   Collection Time: 07/17/17 11:48 AM  Result Value Ref Range Status   Enterococcus species NOT DETECTED NOT DETECTED Final   Listeria monocytogenes NOT DETECTED NOT DETECTED Final   Staphylococcus species NOT DETECTED NOT DETECTED Final   Staphylococcus aureus NOT  DETECTED NOT DETECTED Final   Streptococcus species NOT DETECTED NOT DETECTED Final   Streptococcus agalactiae NOT DETECTED NOT DETECTED Final   Streptococcus pneumoniae NOT DETECTED NOT DETECTED Final   Streptococcus pyogenes NOT DETECTED NOT DETECTED Final   Acinetobacter baumannii NOT DETECTED NOT DETECTED Final   Enterobacteriaceae species DETECTED (A) NOT DETECTED Final    Comment: Enterobacteriaceae represent a large family of gram-negative bacteria, not a single organism. CRITICAL RESULT CALLED TO, READ BACK BY AND VERIFIED WITH: M BELL PHARMD 07/18/17 0306 JDW    Enterobacter cloacae complex NOT DETECTED NOT DETECTED Final   Escherichia coli DETECTED (A) NOT DETECTED Final    Comment: CRITICAL RESULT CALLED TO, READ BACK BY AND VERIFIED WITH:  M BELL PHARMD 07/18/17 0306 JDW    Klebsiella oxytoca NOT DETECTED NOT DETECTED Final  Klebsiella pneumoniae NOT DETECTED NOT DETECTED Final   Proteus species NOT DETECTED NOT DETECTED Final   Serratia marcescens NOT DETECTED NOT DETECTED Final   Carbapenem resistance NOT DETECTED NOT DETECTED Final   Haemophilus influenzae NOT DETECTED NOT DETECTED Final   Neisseria meningitidis NOT DETECTED NOT DETECTED Final   Pseudomonas aeruginosa NOT DETECTED NOT DETECTED Final   Candida albicans NOT DETECTED NOT DETECTED Final   Candida glabrata NOT DETECTED NOT DETECTED Final   Candida krusei NOT DETECTED NOT DETECTED Final   Candida parapsilosis NOT DETECTED NOT DETECTED Final   Candida tropicalis NOT DETECTED NOT DETECTED Final  Blood Culture (routine x 2)     Status: None (Preliminary result)   Collection Time: 07/17/17 11:52 AM  Result Value Ref Range Status   Specimen Description   Final    BLOOD LEFT HAND Performed at [facility], [address], [place], [place] [address]    Special Requests   Final    BOTTLES DRAWN AEROBIC AND ANAEROBIC Blood Culture adequate volume Performed at [facility],  [address], [place], [place] [address]    Culture   Final    NO GROWTH 2 DAYS Performed at [facility], [address] [place], [place] [address]    Report Status PENDING  Incomplete  Urine culture     Status: Abnormal   Collection Time: 07/17/17  3:31 PM  Result Value Ref Range Status   Specimen Description   Final    URINE, CLEAN CATCH Performed at [facility], [address] [place], [place] [address]    Special Requests   Final    NONE Performed at [facility], [address] [place], [place] [address]    Culture >=100,000 COLONIES/mL ESCHERICHIA COLI (A)  Final   Report Status 07/19/2017 FINAL  Final   Organism ID, Bacteria ESCHERICHIA COLI (A)  Final      Susceptibility   Escherichia coli - MIC*    AMPICILLIN 4 SENSITIVE Sensitive     CEFAZOLIN <=4 SENSITIVE Sensitive     CEFTRIAXONE <=1 SENSITIVE Sensitive     CIPROFLOXACIN <=0.25 SENSITIVE Sensitive     GENTAMICIN <=1 SENSITIVE Sensitive     IMIPENEM <=0.25 SENSITIVE Sensitive     NITROFURANTOIN <=16 SENSITIVE Sensitive     TRIMETH/SULFA <=20 SENSITIVE Sensitive     AMPICILLIN/SULBACTAM <=2 SENSITIVE Sensitive     PIP/TAZO <=4 SENSITIVE Sensitive     Extended ESBL NEGATIVE Sensitive     * >=100,000 COLONIES/mL ESCHERICHIA COLI  MRSA PCR Screening     Status: None   Collection Time: 07/18/17  1:47 AM  Result Value Ref Range Status   MRSA by PCR NEGATIVE NEGATIVE Final    Comment:        The GeneXpert MRSA Assay (FDA approved for NASAL specimens only), is one component of a comprehensive MRSA colonization surveillance program. It is not intended to diagnose MRSA infection nor to guide or monitor treatment for MRSA infections. Performed at [facility], [address], [place], [place] [address]          Radiology Studies: No results found.      Scheduled Meds: . amiodarone  200 mg Oral Daily  . amitriptyline  50 mg Oral  QHS  . diltiazem  300 mg Oral Daily  . enoxaparin (LOVENOX) injection  40 mg Subcutaneous Q24H  . fluticasone furoate-vilanterol  1 puff Inhalation Daily  . mycophenolate  500 mg Oral BID  . pantoprazole  40 mg Oral BID  . vortioxetine HBr  10 mg Oral Daily  . zolpidem  5 mg Oral QHS   Continuous Infusions: . sodium chloride 75 mL/hr at 07/19/17 0600  . cefTRIAXone (ROCEPHIN)  IV Stopped (07/19/17 1123)  . famotidine (PEPCID) IV Stopped (07/19/17 1124)     LOS: 1 day    Time spent: 35 minutes.     Kathlen Mody, MD [facility] Pager [phone]  If 7PM-7AM, please contact night-coverage [email or web address] Password [facility] 07/19/2017, 6:41 PM

## 2017-07-19 NOTE — Progress Notes (Signed)
[facility] updated [facility]F, the patient is not medically stable for discharge today.  CSW notified to call [place] -Nursing director at [phone] when medically stable.   CSW's will continue to follow for discharge needs.   Vivi BarrackNicole Domenic Schoenberger, Theresia MajorsLCSWA, MSW Clinical Social Worker  [phone] 07/19/2017  2:48 PM

## 2017-07-20 ENCOUNTER — Inpatient Hospital Stay (HOSPITAL_COMMUNITY): Payer: Medicare Other

## 2017-07-20 LAB — BASIC METABOLIC PANEL
Anion gap: 10 (ref 5–15)
BUN: 11 mg/dL (ref 6–20)
CHLORIDE: 108 mmol/L (ref 101–111)
CO2: 22 mmol/L (ref 22–32)
CREATININE: 0.45 mg/dL (ref 0.44–1.00)
Calcium: 8.6 mg/dL — ABNORMAL LOW (ref 8.9–10.3)
GFR calc Af Amer: >60 mL/min (ref >60)
GFR calc non Af Amer: >60 mL/min (ref >60)
GLUCOSE: 113 mg/dL — AB (ref 65–99)
Potassium: 3.3 mmol/L — ABNORMAL LOW (ref 3.5–5.1)
Sodium: 140 mmol/L (ref 135–145)

## 2017-07-20 LAB — CBC
HEMATOCRIT: 35.8 % — AB (ref 36.0–46.0)
HEMOGLOBIN: 11.1 g/dL — AB (ref 12.0–15.0)
MCH: 28 pg (ref 26.0–34.0)
MCHC: 31 g/dL (ref 30.0–36.0)
MCV: 90.2 fL (ref 78.0–100.0)
Platelets: 152 10*3/uL (ref 150–400)
RBC: 3.97 MIL/uL (ref 3.87–5.11)
RDW: 13.1 % (ref 11.5–15.5)
WBC: 5.5 10*3/uL (ref 4.0–10.5)

## 2017-07-20 LAB — CULTURE, BLOOD (ROUTINE X 2): Special Requests: ADEQUATE

## 2017-07-20 MED ORDER — PROCHLORPERAZINE EDISYLATE 10 MG/2ML IJ SOLN: 10.0000 mg | Freq: Once | INTRAMUSCULAR | Status: DC

## 2017-07-20 MED ORDER — AMOXICILLIN-POT CLAVULANATE 875-125 MG PO TABS
1.0000 | ORAL_TABLET | Freq: Two times a day (BID) | ORAL | Status: DC
  Administered 2017-07-20 – 2017-07-23 (×6): 1 via ORAL
  Filled 2017-07-20 (×6): qty 1

## 2017-07-20 MED ORDER — DIPHENHYDRAMINE HCL 50 MG/ML IJ SOLN: 25.0000 mg | Freq: Once | INTRAMUSCULAR | Status: DC

## 2017-07-20 MED ORDER — POTASSIUM CHLORIDE CRYS ER 20 MEQ PO TBCR
60.0000 meq | EXTENDED_RELEASE_TABLET | Freq: Once | ORAL | Status: AC
  Administered 2017-07-20: 60 meq via ORAL
  Filled 2017-07-20: qty 3

## 2017-07-20 MED ORDER — KETOROLAC TROMETHAMINE 30 MG/ML IJ SOLN: 30.0000 mg | Freq: Once | INTRAMUSCULAR | Status: DC

## 2017-07-20 MED ORDER — AMOXICILLIN-POT CLAVULANATE 875-125 MG PO TABS: 1.0000 | ORAL_TABLET | Freq: Two times a day (BID) | ORAL | 0 refills | Status: DC

## 2017-07-20 MED ORDER — SUCRALFATE 1 GM/10ML PO SUSP
1.0000 g | Freq: Three times a day (TID) | ORAL | Status: DC
  Administered 2017-07-20 – 2017-07-23 (×10): 1 g via ORAL
  Filled 2017-07-20 (×10): qty 10

## 2017-07-20 MED ORDER — PANTOPRAZOLE SODIUM 40 MG PO TBEC: 40.0000 mg | DELAYED_RELEASE_TABLET | Freq: Two times a day (BID) | ORAL | 0 refills | Status: DC

## 2017-07-20 NOTE — Progress Notes (Signed)
PROGRESS NOTE    Paula Bird  HQI:[id number] DOB: [birth year] DOA: 07/17/2017 PCP: Angela Cox, MD    Brief Narrative: Paula Bird is a 84 y.o. female with medical history significant of copd, depression, atrial fibrillation not on anticoagulation, hypertension, gerd, comes in for fever and chills. As per the patient and her son at bedside, they report persistent nausea, vomiting and diarrhea for almost 3 week now.  On arrival to ED, her labs were unremarkable except for abnormal UA.  She was referred to medical service for evaluation and management of UTI and dehydration.       Assessment & Plan:   Active Problems:   Hypertension   Depression   Paroxysmal atrial fibrillation (HCC)   COPD exacerbation (HCC)   Acid indigestion   Myasthenia gravis (HCC)   UTI (urinary tract infection)  SIRS/ Sepsis from E coli Bacteremia from UTI: - Admitted for IV antibiotics, she was started on rocephin and transitioned to oral augmentin to complete the course of 2 weeks.  The E coli in urine is sensitive to rocephin.    Nausea vomiting and diarrhea: Diarrhea resolved.  Nausea and vomiting improved. But not completely resolved.  Pt reports she vomited her breakfast today.  She is on BID PPI and pepcid without much improvement.  GI will be consulted to see if she needs EGD.    Atrial fib Rate controlled on amiodarone. [place] S2vasc score is 4, but she is not on anticoagulation sec to GI bleed.    Copd: Stable.    Myasthenia Gravis: Resume home meds.    Hypertension Well controlled.   Anemia: Pts baseline hemoglobin around 12, currently 10.5. She denies any bleeding or black stools. Not on anticoagulation. Suspect from hemodilution vs anemia of chronic disease.  Repeat cbc shows hemoglobin of 11.   Dehydration: improved.   Slight headache and neck pain: She had CT head and neck done for similar complaints in the past less than year ago, was found to have  arthritic changes. She reports her pain improves with pain meds. Continue with vicodin vs tramadol.   DVT prophylaxis: (LOVENOX.  Code Status: DNR Family Communication: discussed with daughter at bedside.   Disposition Plan: possibly back to SNF tomorrow.    Consultants:   None.    Procedures: none.    Antimicrobials: rocephin since admission.    Subjective: One episode of vomiting this am.   Objective: Vitals:   07/20/17 0630 07/20/17 0843 07/20/17 0846 07/20/17 1412  BP: (!) 120/59   (!) 142/62  Pulse: (!) 129   64  Resp: 14   16  Temp: 98.6 F (37 C)   98.4 F (36.9 C)  TempSrc: Oral   Axillary  SpO2: 94% 94% 94% 99%  Weight:      Height:        Intake/Output Summary (Last 24 hours) at 07/20/2017 1822 Last data filed at 07/20/2017 1000 Gross per 24 hour  Intake 600 ml  Output 510 ml  Net 90 ml   Filed Weights   07/17/17 1123  Weight: 63.5 kg (140 lb)    Examination:  General exam: Appears calm and comfortable, in mild distress.  Respiratory system: clear , no wheezing or rhonchi.  Cardiovascular system: S1 & S2 heard, RRR. No JVD, . No pedal edema. Gastrointestinal system:  Abdomen is soft NT ND BS+ Central nervous system: Alert and oriented.non focal.  Extremities: Symmetric 5 x 5 power. Skin: No rashes, lesions or ulcers Psychiatry:  Mood &  affect appropriate.     Data Reviewed: I have personally reviewed following labs and imaging studies  CBC: Recent Labs  Lab 07/17/17 1134 07/18/17 0512 07/20/17 0426  WBC 7.3 9.6 5.5  NEUTROABS 5.3  --   --   HGB 12.3 10.9* 11.1*  HCT 38.8 35.0* 35.8*  MCV 89.2 90.4 90.2  PLT 177 153 152   Basic Metabolic Panel: Recent Labs  Lab 07/17/17 1134 07/18/17 0512 07/20/17 0426  NA 133* 140 140  K 3.6 3.7 3.3*  CL 102 110 108  CO2 GLUCOSE 135* 108* 113*  BUN 22* 14 11  CREATININE 0.58 0.52 0.45  CALCIUM 8.8* 8.3* 8.6*   GFR: Estimated Creatinine Clearance: 39.7 mL/min (by C-G formula  based on SCr of 0.45 mg/dL). Liver Function Tests: Recent Labs  Lab 07/17/17 1134  AST 18  ALT 23  ALKPHOS 52  BILITOT 0.6  PROT 6.9  ALBUMIN 3.6   No results for input(s): LIPASE, AMYLASE in the last 168 hours. No results for input(s): AMMONIA in the last 168 hours. Coagulation Profile: No results for input(s): INR, PROTIME in the last 168 hours. Cardiac Enzymes: No results for input(s): CKTOTAL, CKMB, CKMBINDEX, TROPONINI in the last 168 hours. BNP (last 3 results) No results for input(s): PROBNP in the last 8760 hours. HbA1C: No results for input(s): HGBA1C in the last 72 hours. CBG: No results for input(s): GLUCAP in the last 168 hours. Lipid Profile: No results for input(s): CHOL, HDL, LDLCALC, TRIG, CHOLHDL, LDLDIRECT in the last 72 hours. Thyroid Function Tests: No results for input(s): TSH, T4TOTAL, FREET4, T3FREE, THYROIDAB in the last 72 hours. Anemia Panel: No results for input(s): VITAMINB12, FOLATE, FERRITIN, TIBC, IRON, RETICCTPCT in the last 72 hours. Sepsis Labs: Recent Labs  Lab 07/17/17 1158  LATICACIDVEN 1.43    Recent Results (from the past 240 hour(s))  Blood Culture (routine x 2)     Status: Abnormal   Collection Time: 07/17/17 11:48 AM  Result Value Ref Range Status   Specimen Description   Final    BLOOD LEFT FOREARM Performed at [facility], [address], [place], [place] [address]    Special Requests   Final    BOTTLES DRAWN AEROBIC AND ANAEROBIC Blood Culture adequate volume Performed at [facility], [address] [place], [place] [address]    Culture  Setup Time   Final    ANAEROBIC BOTTLE ONLY GRAM NEGATIVE RODS CRITICAL RESULT CALLED TO, READ BACK BY AND VERIFIED WITH: Sophronia Simas [facility] 07/18/17 0981 JDW Performed at [facility], [address] [place], [place] [address]    Culture ESCHERICHIA COLI (A)  Final   Report Status 07/20/2017 FINAL  Final   Organism ID, Bacteria ESCHERICHIA  COLI  Final      Susceptibility   Escherichia coli - MIC*    AMPICILLIN 4 SENSITIVE Sensitive     CEFAZOLIN <=4 SENSITIVE Sensitive     CEFEPIME <=1 SENSITIVE Sensitive     CEFTAZIDIME <=1 SENSITIVE Sensitive     CEFTRIAXONE <=1 SENSITIVE Sensitive     CIPROFLOXACIN <=0.25 SENSITIVE Sensitive     GENTAMICIN <=1 SENSITIVE Sensitive     IMIPENEM <=0.25 SENSITIVE Sensitive     TRIMETH/SULFA <=20 SENSITIVE Sensitive     AMPICILLIN/SULBACTAM <=2 SENSITIVE Sensitive     PIP/TAZO <=4 SENSITIVE Sensitive     Extended ESBL NEGATIVE Sensitive     * ESCHERICHIA COLI  Blood Culture ID Panel (  Reflexed)     Status: Abnormal   Collection Time: 07/17/17 11:48 AM  Result Value Ref Range Status   Enterococcus species NOT DETECTED NOT DETECTED Final   Listeria monocytogenes NOT DETECTED NOT DETECTED Final   Staphylococcus species NOT DETECTED NOT DETECTED Final   Staphylococcus aureus NOT DETECTED NOT DETECTED Final   Streptococcus species NOT DETECTED NOT DETECTED Final   Streptococcus agalactiae NOT DETECTED NOT DETECTED Final   Streptococcus pneumoniae NOT DETECTED NOT DETECTED Final   Streptococcus pyogenes NOT DETECTED NOT DETECTED Final   Acinetobacter baumannii NOT DETECTED NOT DETECTED Final   Enterobacteriaceae species DETECTED (A) NOT DETECTED Final    Comment: Enterobacteriaceae represent a large family of gram-negative bacteria, not a single organism. CRITICAL RESULT CALLED TO, READ BACK BY AND VERIFIED WITH: M BELL PHARMD 07/18/17 0306 JDW    Enterobacter cloacae complex NOT DETECTED NOT DETECTED Final   Escherichia coli DETECTED (A) NOT DETECTED Final    Comment: CRITICAL RESULT CALLED TO, READ BACK BY AND VERIFIED WITH:  Judie Petit BELL PHARMD 07/18/17 0306 JDW    Klebsiella oxytoca NOT DETECTED NOT DETECTED Final   Klebsiella pneumoniae NOT DETECTED NOT DETECTED Final   Proteus species NOT DETECTED NOT DETECTED Final   Serratia marcescens NOT DETECTED NOT DETECTED Final   Carbapenem  resistance NOT DETECTED NOT DETECTED Final   Haemophilus influenzae NOT DETECTED NOT DETECTED Final   Neisseria meningitidis NOT DETECTED NOT DETECTED Final   Pseudomonas aeruginosa NOT DETECTED NOT DETECTED Final   Candida albicans NOT DETECTED NOT DETECTED Final   Candida glabrata NOT DETECTED NOT DETECTED Final   Candida krusei NOT DETECTED NOT DETECTED Final   Candida parapsilosis NOT DETECTED NOT DETECTED Final   Candida tropicalis NOT DETECTED NOT DETECTED Final  Blood Culture (routine x 2)     Status: None (Preliminary result)   Collection Time: 07/17/17 11:52 AM  Result Value Ref Range Status   Specimen Description   Final    BLOOD LEFT HAND Performed at [facility], [address] [place], [place] [address]    Special Requests   Final    BOTTLES DRAWN AEROBIC AND ANAEROBIC Blood Culture adequate volume Performed at [facility], [address], [place], [place] [address]    Culture   Final    NO GROWTH 3 DAYS Performed at [facility], [address] [place], [place] [address]    Report Status PENDING  Incomplete  Urine culture     Status: Abnormal   Collection Time: 07/17/17  3:31 PM  Result Value Ref Range Status   Specimen Description   Final    URINE, CLEAN CATCH Performed at [facility], [address] [place], [place] [address]    Special Requests   Final    NONE Performed at [facility], [address], [place], [place] [address]    Culture >=100,000 COLONIES/mL ESCHERICHIA COLI (A)  Final   Report Status 07/19/2017 FINAL  Final   Organism ID, Bacteria ESCHERICHIA COLI (A)  Final      Susceptibility   Escherichia coli - MIC*    AMPICILLIN 4 SENSITIVE Sensitive     CEFAZOLIN <=4 SENSITIVE Sensitive     CEFTRIAXONE <=1 SENSITIVE Sensitive     CIPROFLOXACIN <=0.25 SENSITIVE Sensitive     GENTAMICIN <=1 SENSITIVE Sensitive     IMIPENEM <=0.25 SENSITIVE Sensitive      NITROFURANTOIN <=16 SENSITIVE Sensitive     TRIMETH/SULFA <=20  SENSITIVE Sensitive     AMPICILLIN/SULBACTAM <=2 SENSITIVE Sensitive     PIP/TAZO <=4 SENSITIVE Sensitive     Extended ESBL NEGATIVE Sensitive     * >=100,000 COLONIES/mL ESCHERICHIA COLI  MRSA PCR Screening     Status: None   Collection Time: 07/18/17  1:47 AM  Result Value Ref Range Status   MRSA by PCR NEGATIVE NEGATIVE Final    Comment:        The GeneXpert MRSA Assay (FDA approved for NASAL specimens only), is one component of a comprehensive MRSA colonization surveillance program. It is not intended to diagnose MRSA infection nor to guide or monitor treatment for MRSA infections. Performed at [facility], [address] [place], [place] [address]          Radiology Studies: Dg Abd 1 View  Result Date: 07/20/2017 CLINICAL DATA:  Vomiting. EXAM: ABDOMEN - 1 VIEW COMPARISON:  CT scan July 04, 2017 FINDINGS: Vertebroplasties at T12 and L2. Soft tissue calcifications noted in both flanks. Left hip replacement. Calcifications in the pelvis are likely phleboliths. No renal stones identified. No evidence of bowel obstruction. Lung bases are normal. IMPRESSION: No acute abnormalities. Electronically Signed   By: Gerome Sam III M.D   On: 07/20/2017 18:00        Scheduled Meds: . amiodarone  200 mg Oral Daily  . amitriptyline  50 mg Oral QHS  . amoxicillin-clavulanate  1 tablet Oral Q12H  . diltiazem  300 mg Oral Daily  . diphenhydrAMINE  25 mg Intravenous Once  . enoxaparin (LOVENOX) injection  40 mg Subcutaneous Q24H  . famotidine  20 mg Oral BID  . fluticasone furoate-vilanterol  1 puff Inhalation Daily  . ketorolac  30 mg Intravenous Once  . mycophenolate  500 mg Oral BID  . pantoprazole  40 mg Oral BID  . prochlorperazine  10 mg Intravenous Once  . sucralfate  1 g Oral TID WC & HS  . vortioxetine HBr  10 mg Oral Daily  . zolpidem  5 mg Oral QHS   Continuous Infusions: .  sodium chloride 75 mL/hr at 07/19/17 0600     LOS: 2 days    Time spent: 35 minutes.     Kathlen Mody, MD [facility] Pager [phone]  If 7PM-7AM, please contact night-coverage [email or web address] Password [facility] 07/20/2017, 6:22 PM

## 2017-07-20 NOTE — Progress Notes (Signed)
rm 1540, Paula Bird, Has not received IV medication as you ordered because she does not want IV site., Pt stated H/A is better. and resting.  Will pass information on to coming nurse and for f/up MD in am. Medication has not been given.

## 2017-07-20 NOTE — Progress Notes (Signed)
rm 1540, Paula Bird, in w/ UTI. c/o of severe headache, ongoing for a day. Taking Vicodin/imitrex with some relief, but feels this is different and   Describes pain 8/10 and from back of heat to left side of ear.  Feels it's getting worse. Patient does not appear to have any facial droop, able to stick tongue out without droop. Speech is clear,Able to move arms and leg without any flaccidity, able to grip and squeeze nurse's hand and resist. Denies numbness in legs and arms.  Notified on call MD.

## 2017-07-20 NOTE — Consult Note (Signed)
[facility] Consult  Referring Provider: Kathlen Mody, MD Primary Care Physician:  Angela Cox, MD Primary Gastroenterologist:Unassigned  Reason for Consultation:nausea , vomiting, diarrhea  HPI: Paula Bird is a 84 y.o. female as admitted on 07/17/17 with nausea, vomiting and diarrhea for about 3 weeks. Patient states that she takes MiraLAX and is unable to find the right dosage as somedays she may have multiple loose stools while other days she is constipated or may not have a bowel movement for a day or 2.She lives in a nursing home and states that the food given at the facility is mostly greasy and fried and rich and causes constant nausea with intermittent episodes of vomiting. She was admitted with abnormal UA,Escherichia coli UTI and dehydration.It seems that diarrhea has resolved but patient vomited her breakfast today and GI was consulted for further evaluation.patient states she has intermittent acid reflux and heartburn and has had endoscopy performed at [facility] told she had a hiatal hernia. Denies difficulty swallowing or pain on swallowing. Denies unintentional weight loss. Last colonoscopy was over 10 years ago and states she has history of polyps.   Past Medical History:  Diagnosis Date  . Acute and chronic respiratory failure, unspecified whether with hypoxia or hypercapnia (HCC)   . Afib (HCC)   . Allergic rhinitis   . Anemia   . CKD (chronic kidney disease)   . Constipation   . COPD (chronic obstructive pulmonary disease) (HCC)   . Depression   . Diplopia   . Dysphagia   . Edema, lower extremity   . Esophageal dysmotility   . Gait instability   . GERD (gastroesophageal reflux disease)   . Hypertension   . Hypothyroidism   . IBS (irritable bowel syndrome)   . IDA (iron deficiency anemia)   . Insomnia   . Leukocytosis   . Myasthenia gravis (HCC)   . Oral thrush   . PAF (paroxysmal atrial fibrillation) (HCC)   . Physical  deconditioning   . Pneumonia 02/2017  . Protein calorie malnutrition (HCC)   . Thyroid disease   . Thyroid mass   . Vertigo     Past Surgical History:  Procedure Laterality Date  . ABDOMINAL HYSTERECTOMY    . APPENDECTOMY    . TOTAL HIP ARTHROPLASTY Left   . TOTAL KNEE ARTHROPLASTY Left     Prior to Admission medications   Medication Sig Start Date End Date Taking? Authorizing Provider  amiodarone (PACERONE) 200 MG tablet Take 200 mg by mouth daily. 06/07/17  Yes [provider]  amitriptyline (ELAVIL) 50 MG tablet Take 50 mg by mouth at bedtime. 06/22/17  Yes [provider]  aspirin EC 81 MG tablet Take 81 mg by mouth daily.   Yes [provider]  bisacodyl (DULCOLAX) 10 MG suppository Place 10 mg rectally daily as needed for moderate constipation.   Yes [provider]  BREO ELLIPTA 100-25 MCG/INH AEPB Take 1 puff by mouth daily. 06/10/17  Yes [provider]  calcium-vitamin D (OSCAL WITH D) 500-200 MG-UNIT tablet Take 1 tablet by mouth 2 (two) times daily.   Yes [provider]  carboxymethylcellulose (REFRESH PLUS) 0.5 % SOLN Place 2 drops into both eyes 2 (two) times daily as needed (dry eyes).   Yes [provider]  Cholecalciferol (VITAMIN D3) 2000 units TABS Take 2,000 Units by mouth daily.    Yes [provider]  Cranberry 425 MG CAPS Take 425 mg by mouth 2 (two) times daily.   Yes  [provider]  diltiazem (CARDIZEM LA) 300 MG 24 hr tablet Take 300 mg by mouth daily. 03/04/16  Yes [provider]  esomeprazole (NEXIUM) 20 MG capsule Take 20 mg by mouth 2 (two) times daily.    Yes [provider]  hydrochlorothiazide (HYDRODIURIL) 25 MG tablet Take 1 tablet (25 mg total) by mouth every Monday, Wednesday, and Friday. 08/16/15  Yes [name], MD  HYDROcodone-acetaminophen (NORCO/VICODIN) 5-325 MG tablet Take 1 tablet by mouth every 6 (six) hours as needed for moderate pain. For pain  03/13/17  Yes Zannie Cove, MD  INCRUSE ELLIPTA 62.5 MCG/INH AEPB Take 1 puff by mouth daily. 06/10/17  Yes [provider]  lisinopril (PRINIVIL,ZESTRIL) 40 MG tablet Take 40 mg by mouth daily. 03/04/16  Yes [provider]  loperamide (IMODIUM A-D) 2 MG tablet Take 2 mg by mouth every 6 (six) hours as needed for diarrhea or loose stools.   Yes [provider]  mycophenolate (CELLCEPT) 500 MG tablet Take 1 tablet (500 mg total) by mouth 2 (two) times daily. 10/16/16  Yes Levert Feinstein, MD  ondansetron (ZOFRAN) 4 MG tablet Take 1 tablet (4 mg total) by mouth every 8 (eight) hours as needed for nausea or vomiting. 07/04/17  Yes Couture, Cortni S, PA-C  OVER THE COUNTER MEDICATION Take 1 tablet by mouth daily. Vitafusion MVI adult gummies   Yes [provider]  polyethylene glycol (MIRALAX / GLYCOLAX) packet Take 17 g by mouth daily. Patient taking differently: Take 17 g by mouth 2 (two) times daily.  08/27/14  Yes Harris, Abigail, PA-C  senna (SENEXON) 8.6 MG tablet Take 2 tablets by mouth 2 (two) times daily.   Yes [provider]  sennosides-docusate sodium (SENOKOT-S) 8.6-50 MG tablet Take 1 tablet by mouth 2 (two) times daily. 08/16/15  Yes [name], MD  spironolactone (ALDACTONE) 25 MG tablet Take 25 mg by mouth daily.   Yes [provider]  SUMAtriptan (IMITREX) 25 MG tablet Take 50 mg by mouth daily as needed for migraine. May repeat in 2 hours if headache persists or recurs.   Yes [provider]  TRINTELLIX 10 MG TABS tablet Take 10 mg by mouth daily. 06/07/17  Yes [provider]  zolpidem (AMBIEN) 5 MG tablet 5 mg at bedtime. 03/29/17  Yes [provider]  amoxicillin-clavulanate (AUGMENTIN) 875-125 MG tablet Take 1 tablet by mouth every 12 (twelve) hours for 12 days. 07/20/17 08/01/17  Kathlen Mody, MD  benzonatate (TESSALON) 100 MG capsule Take 1 capsule (100 mg total) by mouth every 8 (eight) hours. Patient not  taking: Reported on 07/17/2017 04/22/17   Linwood Dibbles, MD  guaiFENesin (MUCINEX) 600 MG 12 hr tablet Take 1 tablet (600 mg total) by mouth 2 (two) times daily. For 5days Patient not taking: Reported on 04/18/2017 03/13/17   Zannie Cove, MD  pantoprazole (PROTONIX) 40 MG tablet Take 1 tablet (40 mg total) by mouth 2 (two) times daily. 07/20/17   Kathlen Mody, MD  promethazine (PHENERGAN) 25 MG tablet Take 25 mg by mouth every 6 (six) hours as needed. for GERD 07/03/17   [provider]    Current Facility-Administered Medications  Medication Dose Route Frequency Provider Last Rate Last Dose  . 0.9 %  sodium chloride infusion   Intravenous Continuous Kathlen Mody, MD 75 mL/hr at 07/19/17 0600    . amiodarone (PACERONE) tablet 200 mg  200 mg Oral Daily Kathlen Mody, MD   200 mg at 07/20/17 0936  .  amitriptyline (ELAVIL) tablet 50 mg  50 mg Oral QHS Kathlen Mody, MD   50 mg at 07/19/17 2144  . amoxicillin-clavulanate (AUGMENTIN) 875-125 MG per tablet 1 tablet  1 tablet Oral Q12H Kathlen Mody, MD   1 tablet at 07/20/17 0936  . diltiazem (CARDIZEM CD) 24 hr capsule 300 mg  300 mg Oral Daily Kathlen Mody, MD   300 mg at 07/20/17 0936  . diphenhydrAMINE (BENADRYL) injection 25 mg  25 mg Intravenous Once Bodenheimer, Charles A, NP      . enoxaparin (LOVENOX) injection 40 mg  40 mg Subcutaneous Q24H Kathlen Mody, MD   40 mg at 07/20/17 0936  . famotidine (PEPCID) tablet 20 mg  20 mg Oral BID Stevie Kern A, NP   20 mg at 07/20/17 0936  . fluticasone furoate-vilanterol (BREO ELLIPTA) 100-25 MCG/INH 1 puff  1 puff Inhalation Daily Kathlen Mody, MD   1 puff at 07/20/17 0843  . HYDROcodone-acetaminophen (NORCO/VICODIN) 5-325 MG per tablet 1 tablet  1 tablet Oral Q6H PRN Kathlen Mody, MD   1 tablet at 07/20/17 0936  . ketorolac (TORADOL) 30 MG/ML injection 30 mg  30 mg Intravenous Once Bodenheimer, Charles A, NP      . mycophenolate (CELLCEPT) capsule 500 mg  500 mg Oral BID Kathlen Mody, MD   500 mg at 07/20/17 0936  . ondansetron (ZOFRAN) injection 4 mg  4 mg Intravenous Q6H PRN Kathlen Mody, MD   4 mg at 07/18/17 1954  . pantoprazole (PROTONIX) EC tablet 40 mg  40 mg Oral BID Kathlen Mody, MD   40 mg at 07/20/17 0936  . polyethylene glycol (MIRALAX / GLYCOLAX) packet 17 g  17 g Oral Daily PRN Stevie Kern A, NP   17 g at 07/19/17 1648  . prochlorperazine (COMPAZINE) injection 10 mg  10 mg Intravenous Once Bodenheimer, Charles A, NP      . sucralfate (CARAFATE) 1 GM/10ML suspension 1 g  1 g Oral TID WC & HS Kerin Salen, MD      . SUMAtriptan (IMITREX) tablet 50 mg  50 mg Oral Daily PRN Kathlen Mody, MD   50 mg at 07/19/17 0913  . vortioxetine HBr (TRINTELLIX) tablet 10 mg  10 mg Oral Daily Kathlen Mody, MD   10 mg at 07/20/17 0936  . zolpidem (AMBIEN) tablet 5 mg  5 mg Oral QHS Kathlen Mody, MD   5 mg at 07/19/17 2144    Allergies as of 07/17/2017  . (No Known Allergies)    Family History  Problem Relation Age of Onset  . Hypertension Mother   . Hypertension Son   . Diabetes Neg Hx   . Thyroid disease Neg Hx     Social History   Socioeconomic History  . Marital status: Widowed    Spouse name: Not on file  . Number of children: 2  . Years of education: HS  . Highest education level: Not on file  Occupational History  . Occupation: Retired  Engineer, production  . Financial resource strain: Not on file  . Food insecurity:    Worry: Not on file    Inability: Not on file  . Transportation needs:    Medical: Not on file    Non-medical: Not on file  Tobacco Use  . Smoking status: Never Smoker  . Smokeless tobacco: Never Used  Substance and Sexual Activity  . Alcohol use: No  . Drug use: No  . Sexual activity: Not on file  Lifestyle  . Physical  activity:    Days per week: Not on file    Minutes per session: Not on file  . Stress: Not on file  Relationships  . Social connections:    Talks on phone: Not on file    Gets together: Not on  file    Attends religious service: Not on file    Active member of club or organization: Not on file    Attends meetings of clubs or organizations: Not on file    Relationship status: Not on file  . Intimate partner violence:    Fear of current or ex partner: Not on file    Emotionally abused: Not on file    Physically abused: Not on file    Forced sexual activity: Not on file  Other Topics Concern  . Not on file  Social History Narrative   Resides at [facility]   Left-handed.   2 cups caffeine per day.    Review of Systems: Positive for: GI: Described in detail in HPI.    Gen:  Anorexia, Denies any fever, chills, rigors, night sweats, fatigue, weakness, malaise, involuntary weight loss, and sleep disorder CV: Denies chest pain, angina, palpitations, syncope, orthopnea, PND, peripheral edema, and claudication. Resp: Denies dyspnea, cough, sputum, wheezing, coughing up blood. GU : Denies urinary burning, blood in urine, urinary frequency, urinary hesitancy, nocturnal urination, and urinary incontinence. MS: Denies joint pain or swelling.  Denies muscle weakness, cramps, atrophy.  Derm: Denies rash, itching, oral ulcerations, hives, unhealing ulcers.  Psych: Denies depression, anxiety, memory loss, suicidal ideation, hallucinations,  and confusion. Heme: Denies bruising, bleeding, and enlarged lymph nodes. Neuro:  Denies any headaches, dizziness, paresthesias. Endo:  Denies any problems with DM, thyroid, adrenal function.  Physical Exam: Vital signs in last 24 hours: Temp:  [97.7 F (36.5 C)-98.6 F (37 C)] 98.4 F (36.9 C) (04/27 1412) Pulse Rate:  [61-129] 64 (04/27 1412) Resp:  [14-16] 16 (04/27 1412) BP: (120-142)/(55-62) 142/62 (04/27 1412) SpO2:  [94 %-99 %] 99 % (04/27 1412) Last BM Date: 07/20/17  General:   Alert,  Well-developed, well-nourished, pleasant and cooperative in NAD Head:  Normocephalic and atraumatic. Eyes:  Sclera clear, no icterus.    Mild pallor Ears:  Normal auditory acuity. Nose:  No deformity, discharge,  or lesions. Mouth:  No deformity or lesions.  Oropharynx pink & moist. Neck:  Supple; no masses or thyromegaly. Lungs:  Clear throughout to auscultation.   No wheezes, crackles, or rhonchi. No acute distress. Heart:  Regular rate and rhythm; no murmurs, clicks, rubs,  or gallops. Extremities:  Without clubbing or edema. Neurologic:  Alert and  oriented x4;  grossly normal neurologically. Skin:  Intact without significant lesions or rashes. Psych:  Alert and cooperative. Normal mood and affect. Abdomen:  Soft, nontender and nondistended. No masses, hepatosplenomegaly or hernias noted. Normal bowel sounds, without guarding, and without rebound.         Lab Results: Recent Labs    07/18/17 0512 07/20/17 0426  WBC 9.6 5.5  HGB 10.9* 11.1*  HCT 35.0* 35.8*  PLT 153 152   BMET Recent Labs    07/18/17 0512 07/20/17 0426  NA 140 140  K 3.7 3.3*  CL 110 108  CO2 22 22  GLUCOSE 108* 113*  BUN 14 11  CREATININE 0.52 0.45  CALCIUM 8.3* 8.6*   LFT No results for input(s): PROT, ALBUMIN, AST, ALT, ALKPHOS, BILITOT, BILIDIR, IBILI in the last 72 hours. PT/INR No results for input(s): LABPROT,  INR in the last 72 hours.  Studies/Results: No results found.  Impression: Nausea, vomiting to 3 weeks.?Medication related-patient on amiodarone No gallstones noted, no biliary dilatation. Elevated AST ALT of 126/347 on 07/04/17, normal LFTs on 07/17/17, normal lipase and 07/04/17. CT from 4/11/19showed mild pancreatic atrophy,unremarkable stomach, no evidence of bowel wall thickening change.  Diarrhea-resolved, change in bowel habits and requires MiraLAX as needed.  UTI, Escherichia coli  Normocytic anemia, hemoglobin 11.1, MCV 90.2.    Plan: Diagnostic EGD in a.m. with antral biopsies to rule out H. Pylori and small bowel biopsies to rule out celiac disease. Currently on Protonix 40 mg twice a day as well  as Pepcid 20 mg twice a day. Will add sucralfate 1 g suspension 3 times a day and at bedtime for possible biliary gastritis.    LOS: 2 days   Kerin Salen, M.D.  07/20/2017, 3:32 PM  Pager [phone] If no answer or after 5 PM call [phone]

## 2017-07-20 NOTE — H&P (View-Only) (Signed)
[facility] Consult  Referring Provider: Akula, Vijaya, Paula Bird Primary Care Physician:  Dasanayaka, Gayani Y, Paula Bird Primary Gastroenterologist:Unassigned  Reason for Consultation:nausea , vomiting, diarrhea  HPI: Paula Bird is a 84 y.o. female as admitted on 07/17/17 with nausea, vomiting and diarrhea for about 3 weeks. Patient states that she takes MiraLAX and is unable to find the right dosage as somedays she may have multiple loose stools while other days she is constipated or may not have a bowel movement for a day or 2.She lives in a nursing home and states that the food given at the facility is mostly greasy and fried and rich and causes constant nausea with intermittent episodes of vomiting. She was admitted with abnormal UA,Escherichia coli UTI and dehydration.It seems that diarrhea has resolved but patient vomited her breakfast today and GI was consulted for further evaluation.patient states she has intermittent acid reflux and heartburn and has had endoscopy performed at [place],was told she had a hiatal hernia. Denies difficulty swallowing or pain on swallowing. Denies unintentional weight loss. Last colonoscopy was over 10 years ago and states she has history of polyps.   Past Medical History:  Diagnosis Date  . Acute and chronic respiratory failure, unspecified whether with hypoxia or hypercapnia (HCC)   . Afib (HCC)   . Allergic rhinitis   . Anemia   . CKD (chronic kidney disease)   . Constipation   . COPD (chronic obstructive pulmonary disease) (HCC)   . Depression   . Diplopia   . Dysphagia   . Edema, lower extremity   . Esophageal dysmotility   . Gait instability   . GERD (gastroesophageal reflux disease)   . Hypertension   . Hypothyroidism   . IBS (irritable bowel syndrome)   . IDA (iron deficiency anemia)   . Insomnia   . Leukocytosis   . Myasthenia gravis (HCC)   . Oral thrush   . PAF (paroxysmal atrial fibrillation) (HCC)   . Physical  deconditioning   . Pneumonia 02/2017  . Protein calorie malnutrition (HCC)   . Thyroid disease   . Thyroid mass   . Vertigo     Past Surgical History:  Procedure Laterality Date  . ABDOMINAL HYSTERECTOMY    . APPENDECTOMY    . TOTAL HIP ARTHROPLASTY Left   . TOTAL KNEE ARTHROPLASTY Left     Prior to Admission medications   Medication Sig Start Date End Date Taking? Authorizing Provider  amiodarone (PACERONE) 200 MG tablet Take 200 mg by mouth daily. 06/07/17  Yes Provider, Historical, Paula Bird  amitriptyline (ELAVIL) 50 MG tablet Take 50 mg by mouth at bedtime. 06/22/17  Yes Provider, Historical, Paula Bird  aspirin EC 81 MG tablet Take 81 mg by mouth daily.   Yes Provider, Historical, Paula Bird  bisacodyl (DULCOLAX) 10 MG suppository Place 10 mg rectally daily as needed for moderate constipation.   Yes Provider, Historical, Paula Bird  BREO ELLIPTA 100-25 MCG/INH AEPB Take 1 puff by mouth daily. 06/10/17  Yes Provider, Historical, Paula Bird  calcium-vitamin D (OSCAL WITH D) 500-200 MG-UNIT tablet Take 1 tablet by mouth 2 (two) times daily.   Yes Provider, Historical, Paula Bird  carboxymethylcellulose (REFRESH PLUS) 0.5 % SOLN Place 2 drops into both eyes 2 (two) times daily as needed (dry eyes).   Yes Provider, Historical, Paula Bird  Cholecalciferol (VITAMIN D3) 2000 units TABS Take 2,000 Units by mouth daily.    Yes Provider, Historical, Paula Bird  Cranberry 425 MG CAPS Take 425 mg by mouth 2 (two) times daily.   Yes   Provider, Historical, Paula Bird  diltiazem (CARDIZEM LA) 300 MG 24 hr tablet Take 300 mg by mouth daily. 03/04/16  Yes Provider, Historical, Paula Bird  esomeprazole (NEXIUM) 20 MG capsule Take 20 mg by mouth 2 (two) times daily.    Yes Provider, Historical, Paula Bird  hydrochlorothiazide (HYDRODIURIL) 25 MG tablet Take 1 tablet (25 mg total) by mouth every Monday, Wednesday, and Friday. 08/16/15  Yes Xu, Fang, Paula Bird  HYDROcodone-acetaminophen (NORCO/VICODIN) 5-325 MG tablet Take 1 tablet by mouth every 6 (six) hours as needed for moderate pain. For pain  03/13/17  Yes Joseph, Preetha, Paula Bird  INCRUSE ELLIPTA 62.5 MCG/INH AEPB Take 1 puff by mouth daily. 06/10/17  Yes Provider, Historical, Paula Bird  lisinopril (PRINIVIL,ZESTRIL) 40 MG tablet Take 40 mg by mouth daily. 03/04/16  Yes Provider, Historical, Paula Bird  loperamide (IMODIUM A-D) 2 MG tablet Take 2 mg by mouth every 6 (six) hours as needed for diarrhea or loose stools.   Yes Provider, Historical, Paula Bird  mycophenolate (CELLCEPT) 500 MG tablet Take 1 tablet (500 mg total) by mouth 2 (two) times daily. 10/16/16  Yes Yan, Yijun, Paula Bird  ondansetron (ZOFRAN) 4 MG tablet Take 1 tablet (4 mg total) by mouth every 8 (eight) hours as needed for nausea or vomiting. 07/04/17  Yes Couture, Cortni S, PA-C  OVER THE COUNTER MEDICATION Take 1 tablet by mouth daily. Vitafusion MVI adult gummies   Yes Provider, Historical, Paula Bird  polyethylene glycol (MIRALAX / GLYCOLAX) packet Take 17 g by mouth daily. Patient taking differently: Take 17 g by mouth 2 (two) times daily.  08/27/14  Yes Harris, Abigail, PA-C  senna (SENEXON) 8.6 MG tablet Take 2 tablets by mouth 2 (two) times daily.   Yes Provider, Historical, Paula Bird  sennosides-docusate sodium (SENOKOT-S) 8.6-50 MG tablet Take 1 tablet by mouth 2 (two) times daily. 08/16/15  Yes Xu, Fang, Paula Bird  spironolactone (ALDACTONE) 25 MG tablet Take 25 mg by mouth daily.   Yes Provider, Historical, Paula Bird  SUMAtriptan (IMITREX) 25 MG tablet Take 50 mg by mouth daily as needed for migraine. May repeat in 2 hours if headache persists or recurs.   Yes Provider, Historical, Paula Bird  TRINTELLIX 10 MG TABS tablet Take 10 mg by mouth daily. 06/07/17  Yes Provider, Historical, Paula Bird  zolpidem (AMBIEN) 5 MG tablet 5 mg at bedtime. 03/29/17  Yes Provider, Historical, Paula Bird  amoxicillin-clavulanate (AUGMENTIN) 875-125 MG tablet Take 1 tablet by mouth every 12 (twelve) hours for 12 days. 07/20/17 08/01/17  Akula, Vijaya, Paula Bird  benzonatate (TESSALON) 100 MG capsule Take 1 capsule (100 mg total) by mouth every 8 (eight) hours. Patient not  taking: Reported on 07/17/2017 04/22/17   Knapp, Jon, Paula Bird  guaiFENesin (MUCINEX) 600 MG 12 hr tablet Take 1 tablet (600 mg total) by mouth 2 (two) times daily. For 5days Patient not taking: Reported on 04/18/2017 03/13/17   Joseph, Preetha, Paula Bird  pantoprazole (PROTONIX) 40 MG tablet Take 1 tablet (40 mg total) by mouth 2 (two) times daily. 07/20/17   Akula, Vijaya, Paula Bird  promethazine (PHENERGAN) 25 MG tablet Take 25 mg by mouth every 6 (six) hours as needed. for GERD 07/03/17   Provider, Historical, Paula Bird    Current Facility-Administered Medications  Medication Dose Route Frequency Provider Last Rate Last Dose  . 0.9 %  sodium chloride infusion   Intravenous Continuous Akula, Vijaya, Paula Bird 75 mL/hr at 07/19/17 0600    . amiodarone (PACERONE) tablet 200 mg  200 mg Oral Daily Akula, Vijaya, Paula Bird   200 mg at 07/20/17 0936  .   amitriptyline (ELAVIL) tablet 50 mg  50 mg Oral QHS Akula, Vijaya, Paula Bird   50 mg at 07/19/17 2144  . amoxicillin-clavulanate (AUGMENTIN) 875-125 MG per tablet 1 tablet  1 tablet Oral Q12H Akula, Vijaya, Paula Bird   1 tablet at 07/20/17 0936  . diltiazem (CARDIZEM CD) 24 hr capsule 300 mg  300 mg Oral Daily Akula, Vijaya, Paula Bird   300 mg at 07/20/17 0936  . diphenhydrAMINE (BENADRYL) injection 25 mg  25 mg Intravenous Once Bodenheimer, Charles A, NP      . enoxaparin (LOVENOX) injection 40 mg  40 mg Subcutaneous Q24H Akula, Vijaya, Paula Bird   40 mg at 07/20/17 0936  . famotidine (PEPCID) tablet 20 mg  20 mg Oral BID Bodenheimer, Charles A, NP   20 mg at 07/20/17 0936  . fluticasone furoate-vilanterol (BREO ELLIPTA) 100-25 MCG/INH 1 puff  1 puff Inhalation Daily Akula, Vijaya, Paula Bird   1 puff at 07/20/17 0843  . HYDROcodone-acetaminophen (NORCO/VICODIN) 5-325 MG per tablet 1 tablet  1 tablet Oral Q6H PRN Akula, Vijaya, Paula Bird   1 tablet at 07/20/17 0936  . ketorolac (TORADOL) 30 MG/ML injection 30 mg  30 mg Intravenous Once Bodenheimer, Charles A, NP      . mycophenolate (CELLCEPT) capsule 500 mg  500 mg Oral BID Akula,  Vijaya, Paula Bird   500 mg at 07/20/17 0936  . ondansetron (ZOFRAN) injection 4 mg  4 mg Intravenous Q6H PRN Akula, Vijaya, Paula Bird   4 mg at 07/18/17 1954  . pantoprazole (PROTONIX) EC tablet 40 mg  40 mg Oral BID Akula, Vijaya, Paula Bird   40 mg at 07/20/17 0936  . polyethylene glycol (MIRALAX / GLYCOLAX) packet 17 g  17 g Oral Daily PRN Bodenheimer, Charles A, NP   17 g at 07/19/17 1648  . prochlorperazine (COMPAZINE) injection 10 mg  10 mg Intravenous Once Bodenheimer, Charles A, NP      . sucralfate (CARAFATE) 1 GM/10ML suspension 1 g  1 g Oral TID WC & HS [place] Bok, Paula Bird      . SUMAtriptan (IMITREX) tablet 50 mg  50 mg Oral Daily PRN Akula, Vijaya, Paula Bird   50 mg at 07/19/17 0913  . vortioxetine HBr (TRINTELLIX) tablet 10 mg  10 mg Oral Daily Akula, Vijaya, Paula Bird   10 mg at 07/20/17 0936  . zolpidem (AMBIEN) tablet 5 mg  5 mg Oral QHS Akula, Vijaya, Paula Bird   5 mg at 07/19/17 2144    Allergies as of 07/17/2017  . (No Known Allergies)    Family History  Problem Relation Age of Onset  . Hypertension Mother   . Hypertension Son   . Diabetes Neg Hx   . Thyroid disease Neg Hx     Social History   Socioeconomic History  . Marital status: Widowed    Spouse name: Not on file  . Number of children: 2  . Years of education: HS  . Highest education level: Not on file  Occupational History  . Occupation: Retired  Social Needs  . Financial resource strain: Not on file  . Food insecurity:    Worry: Not on file    Inability: Not on file  . Transportation needs:    Medical: Not on file    Non-medical: Not on file  Tobacco Use  . Smoking status: Never Smoker  . Smokeless tobacco: Never Used  Substance and Sexual Activity  . Alcohol use: No  . Drug use: No  . Sexual activity: Not on file  Lifestyle  . Physical   activity:    Days per week: Not on file    Minutes per session: Not on file  . Stress: Not on file  Relationships  . Social connections:    Talks on phone: Not on file    Gets together: Not on  file    Attends religious service: Not on file    Active member of club or organization: Not on file    Attends meetings of clubs or organizations: Not on file    Relationship status: Not on file  . Intimate partner violence:    Fear of current or ex partner: Not on file    Emotionally abused: Not on file    Physically abused: Not on file    Forced sexual activity: Not on file  Other Topics Concern  . Not on file  Social History Narrative   Resides at [facility].   Left-handed.   2 cups caffeine per day.    Review of Systems: Positive for: GI: Described in detail in HPI.    Gen:  Anorexia, Denies any fever, chills, rigors, night sweats, fatigue, weakness, malaise, involuntary weight loss, and sleep disorder CV: Denies chest pain, angina, palpitations, syncope, orthopnea, PND, peripheral edema, and claudication. Resp: Denies dyspnea, cough, sputum, wheezing, coughing up blood. GU : Denies urinary burning, blood in urine, urinary frequency, urinary hesitancy, nocturnal urination, and urinary incontinence. MS: Denies joint pain or swelling.  Denies muscle weakness, cramps, atrophy.  Derm: Denies rash, itching, oral ulcerations, hives, unhealing ulcers.  Psych: Denies depression, anxiety, memory loss, suicidal ideation, hallucinations,  and confusion. Heme: Denies bruising, bleeding, and enlarged lymph nodes. Neuro:  Denies any headaches, dizziness, paresthesias. Endo:  Denies any problems with DM, thyroid, adrenal function.  Physical Exam: Vital signs in last 24 hours: Temp:  [97.7 F (36.5 C)-98.6 F (37 C)] 98.4 F (36.9 C) (04/27 1412) Pulse Rate:  [61-129] 64 (04/27 1412) Resp:  [14-16] 16 (04/27 1412) BP: (120-142)/(55-62) 142/62 (04/27 1412) SpO2:  [94 %-99 %] 99 % (04/27 1412) Last BM Date: 07/20/17  General:   Alert,  Well-developed, well-nourished, pleasant and cooperative in NAD Head:  Normocephalic and atraumatic. Eyes:  Sclera clear, no icterus.    Mild pallor Ears:  Normal auditory acuity. Nose:  No deformity, discharge,  or lesions. Mouth:  No deformity or lesions.  Oropharynx pink & moist. Neck:  Supple; no masses or thyromegaly. Lungs:  Clear throughout to auscultation.   No wheezes, crackles, or rhonchi. No acute distress. Heart:  Regular rate and rhythm; no murmurs, clicks, rubs,  or gallops. Extremities:  Without clubbing or edema. Neurologic:  Alert and  oriented x4;  grossly normal neurologically. Skin:  Intact without significant lesions or rashes. Psych:  Alert and cooperative. Normal mood and affect. Abdomen:  Soft, nontender and nondistended. No masses, hepatosplenomegaly or hernias noted. Normal bowel sounds, without guarding, and without rebound.         Lab Results: Recent Labs    07/18/17 0512 07/20/17 0426  WBC 9.6 5.5  HGB 10.9* 11.1*  HCT 35.0* 35.8*  PLT 153 152   BMET Recent Labs    07/18/17 0512 07/20/17 0426  NA 140 140  K 3.7 3.3*  CL 110 108  CO2 22 22  GLUCOSE 108* 113*  BUN 14 11  CREATININE 0.52 0.45  CALCIUM 8.3* 8.6*   LFT No results for input(s): PROT, ALBUMIN, AST, ALT, ALKPHOS, BILITOT, BILIDIR, IBILI in the last 72 hours. PT/INR No results for input(s): LABPROT,   INR in the last 72 hours.  Studies/Results: No results found.  Impression: Nausea, vomiting to 3 weeks.?Medication related-patient on amiodarone No gallstones noted, no biliary dilatation. Elevated AST ALT of 126/347 on 07/04/17, normal LFTs on 07/17/17, normal lipase and 07/04/17. CT from 4/11/19showed mild pancreatic atrophy,unremarkable stomach, no evidence of bowel wall thickening change.  Diarrhea-resolved, change in bowel habits and requires MiraLAX as needed.  UTI, Escherichia coli  Normocytic anemia, hemoglobin 11.1, MCV 90.2.    Plan: Diagnostic EGD in a.m. with antral biopsies to rule out H. Pylori and small bowel biopsies to rule out celiac disease. Currently on Protonix 40 mg twice a day as well  as Pepcid 20 mg twice a day. Will add sucralfate 1 g suspension 3 times a day and at bedtime for possible biliary gastritis.    LOS: 2 days   Paula Bird, M.D.  07/20/2017, 3:32 PM  Pager [phone] If no answer or after 5 PM call [phone] 

## 2017-07-21 ENCOUNTER — Inpatient Hospital Stay (HOSPITAL_COMMUNITY): Payer: Medicare Other | Admitting: Certified Registered Nurse Anesthetist

## 2017-07-21 ENCOUNTER — Encounter (HOSPITAL_COMMUNITY): Payer: Self-pay | Admitting: *Deleted

## 2017-07-21 ENCOUNTER — Encounter (HOSPITAL_COMMUNITY)
Admission: EM | Disposition: A | Discharge status: Skilled Nursing Facility | Payer: Self-pay | Source: Skilled Nursing Facility | Attending: Internal Medicine

## 2017-07-21 HISTORY — PX: ESOPHAGOGASTRODUODENOSCOPY (EGD) WITH PROPOFOL: SHX5813

## 2017-07-21 SURGERY — ESOPHAGOGASTRODUODENOSCOPY (EGD) WITH PROPOFOL
Anesthesia: Monitor Anesthesia Care

## 2017-07-21 MED ORDER — PROPOFOL 10 MG/ML IV BOLUS
INTRAVENOUS | Status: AC
  Filled 2017-07-21: qty 40

## 2017-07-21 MED ORDER — SODIUM CHLORIDE 0.9 % IV SOLN
INTRAVENOUS | Status: DC
  Administered 2017-07-21: 12:00:00 via INTRAVENOUS

## 2017-07-21 MED ORDER — PROPOFOL 10 MG/ML IV BOLUS
INTRAVENOUS | Status: DC | PRN
  Administered 2017-07-21 (×3): 20 mg via INTRAVENOUS
  Administered 2017-07-21: 10 mg via INTRAVENOUS
  Administered 2017-07-21: 20 mg via INTRAVENOUS

## 2017-07-21 MED ORDER — LIDOCAINE 2% (20 MG/ML) 5 ML SYRINGE
INTRAMUSCULAR | Status: DC | PRN
  Administered 2017-07-21: 60 mg via INTRAVENOUS

## 2017-07-21 MED ORDER — PROPOFOL 500 MG/50ML IV EMUL
INTRAVENOUS | Status: DC | PRN
  Administered 2017-07-21: 50 ug/kg/min via INTRAVENOUS

## 2017-07-21 MED ORDER — ONDANSETRON HCL 4 MG/2ML IJ SOLN
INTRAMUSCULAR | Status: DC | PRN
  Administered 2017-07-21: 4 mg via INTRAVENOUS

## 2017-07-21 SURGICAL SUPPLY — 15 items

## 2017-07-21 NOTE — Interval H&P Note (Signed)
History and Physical Interval Note: 89/female with nausea, vomiting and diarrhea for EGD.  07/21/2017 12:34 PM  Lorenza Evangelist  has presented today for EGD with the diagnosis of nausea, vomiting and diarrhea  The various methods of treatment have been discussed with the patient and family. After consideration of risks, benefits and other options for treatment, the patient has consented to  Procedure(s): ESOPHAGOGASTRODUODENOSCOPY (EGD) WITH PROPOFOL (N/A) as a surgical intervention .  The patient's history has been reviewed, patient examined, no change in status, stable for surgery.  I have reviewed the patient's chart and labs.  Questions were answered to the patient's satisfaction.     Kerin Salen

## 2017-07-21 NOTE — Anesthesia Preprocedure Evaluation (Addendum)
Anesthesia Evaluation  Patient identified by MRN, date of birth, ID band Patient awake    Reviewed: Allergy & Precautions, NPO status , Patient's Chart, lab work & pertinent test results  History of Anesthesia Complications Negative for: history of anesthetic complications  Airway Mallampati: II  TM Distance: >3 FB Neck ROM: Full    Dental  (+) Dental Advisory Given, Edentulous Upper, Edentulous Lower   Pulmonary shortness of breath, COPD,  COPD inhaler,    Pulmonary exam normal breath sounds clear to auscultation       Cardiovascular hypertension, Pt. on medications Normal cardiovascular exam+ dysrhythmias Atrial Fibrillation  Rhythm:Regular Rate:Normal     Neuro/Psych PSYCHIATRIC DISORDERS Depression Myasthenia gravis  Neuromuscular disease    GI/Hepatic Neg liver ROS, GERD  ,nausea, vomiting and diarrhea   Endo/Other  Hypothyroidism   Renal/GU Renal InsufficiencyRenal disease     Musculoskeletal  (+) Arthritis ,   Abdominal   Peds  Hematology  (+) Blood dyscrasia, anemia ,   Anesthesia Other Findings Day of surgery medications reviewed with the patient.  Reproductive/Obstetrics                            Anesthesia Physical Anesthesia Plan  ASA: III  Anesthesia Plan: MAC   Post-op Pain Management:    Induction: Intravenous  PONV Risk Score and Plan: 2 and Propofol infusion and Treatment may vary due to age or medical condition  Airway Management Planned: Nasal Cannula  Additional Equipment:   Intra-op Plan:   Post-operative Plan:   Informed Consent: I have reviewed the patients History and Physical, chart, labs and discussed the procedure including the risks, benefits and alternatives for the proposed anesthesia with the patient or authorized representative who has indicated his/her understanding and acceptance.   Dental advisory given  Plan Discussed with: CRNA and  Anesthesiologist  Anesthesia Plan Comments: (Discussed risks/benefits/alternatives to MAC sedation including need for ventilatory support, hypotension, need for conversion to general anesthesia.  All patient questions answered.  Patient/guardian wishes to proceed.)        Anesthesia Quick Evaluation

## 2017-07-21 NOTE — Op Note (Signed)
[facility] Patient Name: Paula Bird Procedure Date: 07/21/2017 MRN: [id number] Attending MD: Kerin Salen , MD Date of Birth: 1925/11/17 CSN: [id number] Age: 84 Admit Type: Inpatient Procedure:                Upper GI endoscopy Indications:              Epigastric abdominal pain, Diarrhea, Nausea with                            vomiting Providers:                Kerin Salen, MD, Harold Barban, RN, Judithann Sauger,                            Technician, Kym Groom, CRNA Referring MD:              Medicines:                Monitored Anesthesia Care Complications:            No immediate complications. Estimated Blood Loss:     Estimated blood loss: none. Procedure:                Pre-Anesthesia Assessment:                           - Prior to the procedure, a History and Physical                            was performed, and patient medications and                            allergies were reviewed. The patient's tolerance of                            previous anesthesia was also reviewed. The risks                            and benefits of the procedure and the sedation                            options and risks were discussed with the patient.                            All questions were answered, and informed consent                            was obtained. Prior Anticoagulants: The patient has                            taken aspirin, last dose was 1 day prior to                            procedure. ASA Grade Assessment: III - A patient  with severe systemic disease. After reviewing the                            risks and benefits, the patient was deemed in                            satisfactory condition to undergo the procedure.                           After obtaining informed consent, the endoscope was                            passed under direct vision. Throughout the                            procedure, the patient's  blood pressure, pulse, and                            oxygen saturations were monitored continuously. The                            EG-[id number] ([id number]) scope was introduced through the                            mouth, and advanced to the second part of duodenum.                            The upper GI endoscopy was accomplished without                            difficulty. The patient tolerated the procedure                            well. Scope In: Scope Out: Findings:      The examined esophagus was normal.      The Z-line was regular and was found 36 cm from the incisors.      A single 10 mm sessile polyp with no bleeding and no stigmata of recent       bleeding was found in the gastric body. The polyp was removed with a hot       snare. Resection and retrieval were complete.      A few 3 mm sessile polyps with no bleeding and no stigmata of recent       bleeding were found in the gastric body.      The cardia and gastric fundus were normal on retroflexion.      Striped moderately erythematous mucosa without bleeding was found in the       gastric antrum. Biopsies were taken with a cold forceps for Helicobacter       pylori testing.      The examined duodenum was normal. Biopsies for histology were taken with       a cold forceps for evaluation of celiac disease.      One non-bleeding superficial gastric ulcer with a clean ulcer base       (Forrest Class III) was found in  the gastric antrum. The lesion was 5 mm       in largest dimension. Impression:               - Normal esophagus.                           - Z-line regular, 36 cm from the incisors.                           - A single gastric polyp. Resected and retrieved.                           - A few gastric polyps.                           - Erythematous mucosa in the antrum. Biopsied.                           - Normal examined duodenum. Biopsied.                           - Non-bleeding gastric ulcer with a clean  ulcer                            base (Forrest Class III). Moderate Sedation:      Patient did not receive moderate sedation for this procedure, but       instead received monitored anesthesia care. Recommendation:           - Resume regular diet.                           - Continue present medications.                           - Await pathology results.                           - Antiemetics as needed. Procedure Code(s):        --- Professional ---                           [phone], Esophagogastroduodenoscopy, flexible,                            transoral; with removal of tumor(s), polyp(s), or                            other lesion(s) by snare technique                           [id number], 59, Esophagogastroduodenoscopy, flexible,                            transoral; with biopsy, single or multiple Diagnosis Code(s):        --- Professional ---  K31.7, Polyp of stomach and duodenum                           K31.89, Other diseases of stomach and duodenum                           K25.9, Gastric ulcer, unspecified as acute or                            chronic, without hemorrhage or perforation                           R10.13, Epigastric pain                           R19.7, Diarrhea, unspecified                           R11.2, Nausea with vomiting, unspecified CPT copyright 2017 [employer] Medical Association. All rights reserved. The codes documented in this report are preliminary and upon coder review may  be revised to meet current compliance requirements. Kerin Salen, MD 07/21/2017 12:59:52 PM This report has been signed electronically. Number of Addenda: 0

## 2017-07-21 NOTE — Brief Op Note (Addendum)
07/17/2017 - 07/21/2017  12:55 PM  PATIENT:  Paula Bird  84 y.o. female  PRE-OPERATIVE DIAGNOSIS:  nausea, vomiting and diarrhea  POST-OPERATIVE DIAGNOSIS:  bx taken, gastric polyp removed via hot snare, mild gastritis  PROCEDURE:  Procedure(s): ESOPHAGOGASTRODUODENOSCOPY (EGD) WITH PROPOFOL (N/A)  SURGEON:  Surgeon(s) and Role:    Ronnette Juniper, MD - Primary  PHYSICIAN ASSISTANT:   ASSISTANTS: Elna Breslow, RN, Nevin Bloodgood, Tech ANESTHESIA:   MAC  EBL:  Minimal  BLOOD ADMINISTERED:none  DRAINS: none   LOCAL MEDICATIONS USED:  NONE  SPECIMEN:  Limited biopsy  DISPOSITION OF SPECIMEN:  PATHOLOGY  COUNTS:  YES  TOURNIQUET:  * No tourniquets in log *  DICTATION: .Dragon Dictation  PLAN OF CARE: Admit to inpatient   PATIENT DISPOSITION:  PACU - hemodynamically stable.   Delay start of Pharmacological VTE agent (>24hrs) due to surgical blood loss or risk of bleeding: no

## 2017-07-21 NOTE — Op Note (Addendum)
EGD was performed for epigastric abdominal pain, unexplained diarrhea, nausea and vomiting.\   Findings: Normal-appearing esophagus, Z line regular at 36 cm. A 10 mm sessile gastric polyp removed via snare polypectomy. A few other small 3 mm sessile polyps in gastric cavity. Unremarkable cardia and fundus in retroflexion. Striped moderately erythematous mucosa in the antrum,biopsies taken for H. Pylori.  One nonbleeding superficial gastric ulcer with clean base about 5 mm in size. Unremarkable duodenal bulb and duodenum, biopsies taken for celiac disease.   Recommendations: Await pathology as an outpatient. PPI once a day, antiemetics as needed. Reassurance. Okay to discharge from GI standpoint.  Nausea, vomiting and diarrhea may be related to medication-patient on CellCept(nausea, vomiting, diarrhea) and amiodarone(nausea and vomiting).  Kerin Salen, M.D.

## 2017-07-21 NOTE — Anesthesia Procedure Notes (Signed)
Procedure Name: MAC Date/Time: 07/21/2017 12:33 PM Performed by: West Pugh, CRNA Pre-anesthesia Checklist: Patient identified, Emergency Drugs available, Suction available, Patient being monitored and Timeout performed Patient Re-evaluated:Patient Re-evaluated prior to induction Oxygen Delivery Method: Nasal cannula Placement Confirmation: positive ETCO2 Dental Injury: Teeth and Oropharynx as per pre-operative assessment

## 2017-07-21 NOTE — Progress Notes (Signed)
PROGRESS NOTE    Monetta Lick  ZOX:[id number] DOB: 1925-08-14 DOA: 07/17/2017 PCP: Angela Cox, MD    Brief Narrative: Paula Bird is a 84 y.o. female with medical history significant of copd, depression, atrial fibrillation not on anticoagulation, hypertension, gerd, comes in for fever and chills. As per the patient and her son at bedside, they report persistent nausea, vomiting and diarrhea for almost 3 week now.  On arrival to ED, her labs were unremarkable except for abnormal UA.  She was referred to medical service for evaluation and management of UTI and dehydration.       Assessment & Plan:   Active Problems:   Hypertension   Depression   Paroxysmal atrial fibrillation (HCC)   COPD exacerbation (HCC)   Acid indigestion   Myasthenia gravis (HCC)   UTI (urinary tract infection)  SIRS/ Sepsis from E coli Bacteremia from UTI: - Admitted for IV antibiotics, she was started on rocephin and transitioned to oral augmentin to complete the course of 2 weeks.  The E coli in urine is sensitive to rocephin and augmentin.    Nausea vomiting and diarrhea: Diarrhea resolved.  Nausea and vomiting improved. But not completely resolved.  GI consulted and she underwent EGD today showing erythematous gastric mucosa, biopsies were taken for H pylori.  Recommendations to continue with daily PPI.  Symptomatic management with anti emetics for now.    Atrial fib Rate controlled on amiodarone. [place] S2vasc score is 4, but she is not on anticoagulation sec to GI bleed.    Copd: Stable.    Myasthenia Gravis: Resume home meds.    Hypertension Well controlled.   Anemia: Pts baseline hemoglobin around 12, currently 10.5. She denies any bleeding or black stools. Not on anticoagulation. Suspect from hemodilution vs anemia of chronic disease.  Repeat cbc shows hemoglobin of 11.  Repeat labs in am.    Dehydration: improved.   Slight headache and neck pain: She had CT  head and neck done for similar complaints in the past less than year ago, was found to have arthritic changes. She reports her pain improves with pain meds. Continue with vicodin vs tramadol.   DVT prophylaxis: (LOVENOX.  Code Status: DNR Family Communication: none at bedside today.    Disposition Plan: possibly back to SNF tomorrow.    Consultants:  None. gastroenterology  Procedures: EGD.    Antimicrobials: augmentin    Subjective: No vomiting today. Drowsy.   Objective: Vitals:   07/21/17 1258 07/21/17 1300 07/21/17 1305 07/21/17 1334  BP: (!) 129/34 (!) 129/34 137/84 (!) 142/68  Pulse: 63 62 60 63  Resp: 16 (!) Temp: 98.3 F (36.8 C)   98 F (36.7 C)  TempSrc: Oral   Oral  SpO2: 94% 94% 95% 97%  Weight:      Height:        Intake/Output Summary (Last 24 hours) at 07/21/2017 1827 Last data filed at 07/21/2017 1500 Gross per 24 hour  Intake 1258.75 ml  Output 750 ml  Net 508.75 ml   Filed Weights   07/17/17 1123  Weight: 63.5 kg (140 lb)    Examination:  General exam: not in distress Respiratory system: good air entry bilateral , no wheezing or rhonchi.  Cardiovascular system: S1 & S2 heard, RRR. No JVD, . No pedal edema. Gastrointestinal system:  Abdomen is soft non tender non distended bowel sounds heard.  Central nervous system: sleepy. Non focal.  Extremities: Symmetric 5 x 5 power. Skin:  No rashes, lesions or ulcers Psychiatry:  Sleepy .      Data Reviewed: I have personally reviewed following labs and imaging studies  CBC: Recent Labs  Lab 07/17/17 1134 07/18/17 0512 07/20/17 0426  WBC 7.3 9.6 5.5  NEUTROABS 5.3  --   --   HGB 12.3 10.9* 11.1*  HCT 38.8 35.0* 35.8*  MCV 89.2 90.4 90.2  PLT 177 153 152   Basic Metabolic Panel: Recent Labs  Lab 07/17/17 1134 07/18/17 0512 07/20/17 0426  NA 133* 140 140  K 3.6 3.7 3.3*  CL 102 110 108  CO2 GLUCOSE 135* 108* 113*  BUN 22* 14 11  CREATININE 0.58 0.52 0.45    CALCIUM 8.8* 8.3* 8.6*   GFR: Estimated Creatinine Clearance: 39.7 mL/min (by C-G formula based on SCr of 0.45 mg/dL). Liver Function Tests: Recent Labs  Lab 07/17/17 1134  AST 18  ALT 23  ALKPHOS 52  BILITOT 0.6  PROT 6.9  ALBUMIN 3.6   No results for input(s): LIPASE, AMYLASE in the last 168 hours. No results for input(s): AMMONIA in the last 168 hours. Coagulation Profile: No results for input(s): INR, PROTIME in the last 168 hours. Cardiac Enzymes: No results for input(s): CKTOTAL, CKMB, CKMBINDEX, TROPONINI in the last 168 hours. BNP (last 3 results) No results for input(s): PROBNP in the last 8760 hours. HbA1C: No results for input(s): HGBA1C in the last 72 hours. CBG: No results for input(s): GLUCAP in the last 168 hours. Lipid Profile: No results for input(s): CHOL, HDL, LDLCALC, TRIG, CHOLHDL, LDLDIRECT in the last 72 hours. Thyroid Function Tests: No results for input(s): TSH, T4TOTAL, FREET4, T3FREE, THYROIDAB in the last 72 hours. Anemia Panel: No results for input(s): VITAMINB12, FOLATE, FERRITIN, TIBC, IRON, RETICCTPCT in the last 72 hours. Sepsis Labs: Recent Labs  Lab 07/17/17 1158  LATICACIDVEN 1.43    Recent Results (from the past 240 hour(s))  Blood Culture (routine x 2)     Status: Abnormal   Collection Time: 07/17/17 11:48 AM  Result Value Ref Range Status   Specimen Description   Final    BLOOD LEFT FOREARM Performed at [facility], [address] [place], [place] [address]    Special Requests   Final    BOTTLES DRAWN AEROBIC AND ANAEROBIC Blood Culture adequate volume Performed at [facility], [address], [place], [place] [address]    Culture  Setup Time   Final    ANAEROBIC BOTTLE ONLY GRAM NEGATIVE RODS CRITICAL RESULT CALLED TO, READ BACK BY AND VERIFIED WITH: Sophronia Simas [facility] 07/18/17 8469 JDW Performed at [facility], [address], [place], [place] [address]    Culture ESCHERICHIA  COLI (A)  Final   Report Status 07/20/2017 FINAL  Final   Organism ID, Bacteria ESCHERICHIA COLI  Final      Susceptibility   Escherichia coli - MIC*    AMPICILLIN 4 SENSITIVE Sensitive     CEFAZOLIN <=4 SENSITIVE Sensitive     CEFEPIME <=1 SENSITIVE Sensitive     CEFTAZIDIME <=1 SENSITIVE Sensitive     CEFTRIAXONE <=1 SENSITIVE Sensitive     CIPROFLOXACIN <=0.25 SENSITIVE Sensitive     GENTAMICIN <=1 SENSITIVE Sensitive     IMIPENEM <=0.25 SENSITIVE Sensitive     TRIMETH/SULFA <=20 SENSITIVE Sensitive     AMPICILLIN/SULBACTAM <=2 SENSITIVE Sensitive     PIP/TAZO <=4 SENSITIVE Sensitive     Extended ESBL NEGATIVE Sensitive     *  ESCHERICHIA COLI  Blood Culture ID Panel (Reflexed)     Status: Abnormal   Collection Time: 07/17/17 11:48 AM  Result Value Ref Range Status   Enterococcus species NOT DETECTED NOT DETECTED Final   Listeria monocytogenes NOT DETECTED NOT DETECTED Final   Staphylococcus species NOT DETECTED NOT DETECTED Final   Staphylococcus aureus NOT DETECTED NOT DETECTED Final   Streptococcus species NOT DETECTED NOT DETECTED Final   Streptococcus agalactiae NOT DETECTED NOT DETECTED Final   Streptococcus pneumoniae NOT DETECTED NOT DETECTED Final   Streptococcus pyogenes NOT DETECTED NOT DETECTED Final   Acinetobacter baumannii NOT DETECTED NOT DETECTED Final   Enterobacteriaceae species DETECTED (A) NOT DETECTED Final    Comment: Enterobacteriaceae represent a large family of gram-negative bacteria, not a single organism. CRITICAL RESULT CALLED TO, READ BACK BY AND VERIFIED WITH: M BELL PHARMD 07/18/17 0306 JDW    Enterobacter cloacae complex NOT DETECTED NOT DETECTED Final   Escherichia coli DETECTED (A) NOT DETECTED Final    Comment: CRITICAL RESULT CALLED TO, READ BACK BY AND VERIFIED WITH:  Judie Petit BELL PHARMD 07/18/17 0306 JDW    Klebsiella oxytoca NOT DETECTED NOT DETECTED Final   Klebsiella pneumoniae NOT DETECTED NOT DETECTED Final   Proteus species NOT DETECTED  NOT DETECTED Final   Serratia marcescens NOT DETECTED NOT DETECTED Final   Carbapenem resistance NOT DETECTED NOT DETECTED Final   Haemophilus influenzae NOT DETECTED NOT DETECTED Final   Neisseria meningitidis NOT DETECTED NOT DETECTED Final   Pseudomonas aeruginosa NOT DETECTED NOT DETECTED Final   Candida albicans NOT DETECTED NOT DETECTED Final   Candida glabrata NOT DETECTED NOT DETECTED Final   Candida krusei NOT DETECTED NOT DETECTED Final   Candida parapsilosis NOT DETECTED NOT DETECTED Final   Candida tropicalis NOT DETECTED NOT DETECTED Final  Blood Culture (routine x 2)     Status: None (Preliminary result)   Collection Time: 07/17/17 11:52 AM  Result Value Ref Range Status   Specimen Description   Final    BLOOD LEFT HAND Performed at [facility], [address] [place], [place] [address]    Special Requests   Final    BOTTLES DRAWN AEROBIC AND ANAEROBIC Blood Culture adequate volume Performed at [facility], [address], [place], [place] [address]    Culture   Final    NO GROWTH 4 DAYS Performed at [facility], [address], [place], [place] [address]    Report Status PENDING  Incomplete  Urine culture     Status: Abnormal   Collection Time: 07/17/17  3:31 PM  Result Value Ref Range Status   Specimen Description   Final    URINE, CLEAN CATCH Performed at [facility], [address], [place], [place] [address]    Special Requests   Final    NONE Performed at [facility], [address] [place], [place] [address]    Culture >=100,000 COLONIES/mL ESCHERICHIA COLI (A)  Final   Report Status 07/19/2017 FINAL  Final   Organism ID, Bacteria ESCHERICHIA COLI (A)  Final      Susceptibility   Escherichia coli - MIC*    AMPICILLIN 4 SENSITIVE Sensitive     CEFAZOLIN <=4 SENSITIVE Sensitive     CEFTRIAXONE <=1 SENSITIVE Sensitive     CIPROFLOXACIN <=0.25 SENSITIVE Sensitive      GENTAMICIN <=1 SENSITIVE Sensitive     IMIPENEM <=0.25 SENSITIVE Sensitive     NITROFURANTOIN <=16 SENSITIVE  Sensitive     TRIMETH/SULFA <=20 SENSITIVE Sensitive     AMPICILLIN/SULBACTAM <=2 SENSITIVE Sensitive     PIP/TAZO <=4 SENSITIVE Sensitive     Extended ESBL NEGATIVE Sensitive     * >=100,000 COLONIES/mL ESCHERICHIA COLI  MRSA PCR Screening     Status: None   Collection Time: 07/18/17  1:47 AM  Result Value Ref Range Status   MRSA by PCR NEGATIVE NEGATIVE Final    Comment:        The GeneXpert MRSA Assay (FDA approved for NASAL specimens only), is one component of a comprehensive MRSA colonization surveillance program. It is not intended to diagnose MRSA infection nor to guide or monitor treatment for MRSA infections. Performed at [facility], [address] [place], [place] [address]          Radiology Studies: Dg Abd 1 View  Result Date: 07/20/2017 CLINICAL DATA:  Vomiting. EXAM: ABDOMEN - 1 VIEW COMPARISON:  CT scan July 04, 2017 FINDINGS: Vertebroplasties at T12 and L2. Soft tissue calcifications noted in both flanks. Left hip replacement. Calcifications in the pelvis are likely phleboliths. No renal stones identified. No evidence of bowel obstruction. Lung bases are normal. IMPRESSION: No acute abnormalities. Electronically Signed   By: Gerome Sam III M.D   On: 07/20/2017 18:00        Scheduled Meds: . amiodarone  200 mg Oral Daily  . amitriptyline  50 mg Oral QHS  . amoxicillin-clavulanate  1 tablet Oral Q12H  . diltiazem  300 mg Oral Daily  . diphenhydrAMINE  25 mg Intravenous Once  . enoxaparin (LOVENOX) injection  40 mg Subcutaneous Q24H  . fluticasone furoate-vilanterol  1 puff Inhalation Daily  . ketorolac  30 mg Intravenous Once  . mycophenolate  500 mg Oral BID  . pantoprazole  40 mg Oral BID  . prochlorperazine  10 mg Intravenous Once  . sucralfate  1 g Oral TID WC & HS  . vortioxetine HBr  10 mg Oral Daily  .  zolpidem  5 mg Oral QHS   Continuous Infusions: . sodium chloride 75 mL/hr at 07/19/17 0600     LOS: 3 days    Time spent: 35 minutes.     Kathlen Mody, MD [facility] Pager [phone]  If 7PM-7AM, please contact night-coverage [email or web address] Password [facility] 07/21/2017, 6:27 PM

## 2017-07-21 NOTE — Anesthesia Postprocedure Evaluation (Signed)
Anesthesia Post Note  Patient: Paula Bird  Procedure(s) Performed: ESOPHAGOGASTRODUODENOSCOPY (EGD) WITH PROPOFOL (N/A )     Patient location during evaluation: Endoscopy Anesthesia Type: MAC Level of consciousness: awake and alert Pain management: pain level controlled Vital Signs Assessment: post-procedure vital signs reviewed and stable Respiratory status: spontaneous breathing, nonlabored ventilation and respiratory function stable Cardiovascular status: stable and blood pressure returned to baseline Postop Assessment: no apparent nausea or vomiting Anesthetic complications: no    Last Vitals:  Vitals:   07/21/17 1305 07/21/17 1334  BP: 137/84 (!) 142/68  Pulse: 60 63  Resp: 17 17  Temp:  36.7 C  SpO2: 95% 97%    Last Pain:  Vitals:   07/21/17 1334  TempSrc: Oral  PainSc:                  Catalina Gravel

## 2017-07-21 NOTE — Transfer of Care (Signed)
Immediate Anesthesia Transfer of Care Note  Patient: Paula Bird  Procedure(s) Performed: ESOPHAGOGASTRODUODENOSCOPY (EGD) WITH PROPOFOL (N/A )  Patient Location: PACU  Anesthesia Type:MAC  Level of Consciousness: awake, alert , oriented and patient cooperative  Airway & Oxygen Therapy: Patient Spontanous Breathing and Patient connected to nasal cannula oxygen  Post-op Assessment: Report given to RN and Post -op Vital signs reviewed and stable  Post vital signs: Reviewed and stable  Last Vitals:  Vitals Value Taken Time  BP    Temp 36.8 C 07/21/2017 12:58 PM  Pulse 62 07/21/2017 12:59 PM  Resp 21 07/21/2017 12:59 PM  SpO2 94 % 07/21/2017 12:59 PM  Vitals shown include unvalidated device data.  Last Pain:  Vitals:   07/21/17 1258  TempSrc: Oral  PainSc: 0-No pain      Patients Stated Pain Goal: 3 (44/01/02 7253)  Complications: No apparent anesthesia complications

## 2017-07-22 ENCOUNTER — Encounter (HOSPITAL_COMMUNITY): Payer: Self-pay | Admitting: Gastroenterology

## 2017-07-22 ENCOUNTER — Inpatient Hospital Stay (HOSPITAL_COMMUNITY): Payer: Medicare Other

## 2017-07-22 LAB — CBC
HCT: 37.6 % (ref 36.0–46.0)
HEMOGLOBIN: 11.5 g/dL — AB (ref 12.0–15.0)
MCH: 27.4 pg (ref 26.0–34.0)
MCHC: 30.6 g/dL (ref 30.0–36.0)
MCV: 89.7 fL (ref 78.0–100.0)
Platelets: 159 10*3/uL (ref 150–400)
RBC: 4.19 MIL/uL (ref 3.87–5.11)
RDW: 13.3 % (ref 11.5–15.5)
WBC: 7.6 10*3/uL (ref 4.0–10.5)

## 2017-07-22 LAB — BASIC METABOLIC PANEL
Anion gap: 9 (ref 5–15)
BUN: 18 mg/dL (ref 6–20)
CALCIUM: 8.8 mg/dL — AB (ref 8.9–10.3)
CO2: 23 mmol/L (ref 22–32)
CREATININE: 0.51 mg/dL (ref 0.44–1.00)
Chloride: 107 mmol/L (ref 101–111)
Glucose, Bld: 106 mg/dL — ABNORMAL HIGH (ref 65–99)
Potassium: 3.6 mmol/L (ref 3.5–5.1)
SODIUM: 139 mmol/L (ref 135–145)

## 2017-07-22 LAB — CULTURE, BLOOD (ROUTINE X 2)
Culture: NO GROWTH
SPECIAL REQUESTS: ADEQUATE

## 2017-07-22 MED ORDER — CYCLOBENZAPRINE HCL 5 MG PO TABS
5.0000 mg | ORAL_TABLET | Freq: Three times a day (TID) | ORAL | Status: DC | PRN
Start: 2017-07-22 — End: 2017-07-23

## 2017-07-22 MED ORDER — IOHEXOL 300 MG/ML  SOLN
75.0000 mL | Freq: Once | INTRAMUSCULAR | Status: AC | PRN
  Administered 2017-07-22: 75 mL via INTRAVENOUS

## 2017-07-22 NOTE — Care Management Important Message (Signed)
Important Message  Patient Details  Name: Paula Bird MRN: [id number] Date of Birth: 03-Aug-1925   Medicare Important Message Given:  Yes    Caren Macadam 07/22/2017, 11:43 AMImportant Message  Patient Details  Name: Paula Bird MRN: [id number] Date of Birth: 1926/03/14   Medicare Important Message Given:  Yes    Caren Macadam 07/22/2017, 11:43 AM

## 2017-07-22 NOTE — Evaluation (Signed)
Physical Therapy Evaluation Patient Details Name: Paula Bird MRN: [id number] DOB: Mar 26, 1926 Today's Date: 07/22/2017   History of Present Illness  84 yo female admitted with N/V/D, headache, neck pain, UTI. Hx of COPD, CKD, A fib, myasthenia gravis  Clinical Impression  On eval, pt was Min guard assist for mobility. She walked ~30 feet with a RW. Pt was mildly unsteady but there was no LOB with use of RW. Session was limited due to transporter's arrival to take pt for a CT. Pt should be okay to return to ALF. Will recommend HHPT f/u to ensure safe transition back to facility.     Follow Up Recommendations Home health PT (at ALF to ensure safe transition back to facility)    Equipment Recommendations  None recommended by PT    Recommendations for Other Services       Precautions / Restrictions Precautions Precautions: Fall Restrictions Weight Bearing Restrictions: No      Mobility  Bed Mobility               General bed mobility comments: sitting EOB  Transfers Overall transfer level: Modified independent Equipment used: Rolling walker (2 wheeled) Transfers: Sit to/from Stand              Ambulation/Gait Ambulation/Gait assistance: Min guard Ambulation Distance (Feet): 30 Feet Assistive device: Rolling walker (2 wheeled) Gait Pattern/deviations: Step-through pattern;Decreased stride length     General Gait Details: close guard for safety. No LOB with RW use. Decreased distance due to transporter waiting to take pt for a CT  Stairs            Wheelchair Mobility    Modified Rankin (Stroke Patients Only)       Balance Overall balance assessment: Needs assistance         Standing balance support: Bilateral upper extremity supported Standing balance-Leahy Scale: Poor                               Pertinent Vitals/Pain Pain Assessment: Faces Faces Pain Scale: Hurts a little bit Pain Location: head, neck Pain Descriptors  / Indicators: Aching Pain Intervention(s): Monitored during session    Home Living Family/patient expects to be discharged to:: Assisted living               Home Equipment: Walker - 4 wheels;Wheelchair - manual      Prior Function Level of Independence: Independent with assistive device(s)         Comments: pt stated she performs her own ADLs. Uses rollator for ambulation     Hand Dominance        Extremity/Trunk Assessment   Upper Extremity Assessment Upper Extremity Assessment: Generalized weakness    Lower Extremity Assessment Lower Extremity Assessment: Generalized weakness    Cervical / Trunk Assessment Cervical / Trunk Assessment: Kyphotic  Communication   Communication: No difficulties  Cognition Arousal/Alertness: Awake/alert Behavior During Therapy: WFL for tasks assessed/performed Overall Cognitive Status: Within Functional Limits for tasks assessed                                        General Comments      Exercises     Assessment/Plan    PT Assessment Patient needs continued PT services  PT Problem List Decreased mobility;Decreased balance       PT Treatment Interventions  DME instruction;Gait training;Functional mobility training;Balance training;Patient/family education;Therapeutic activities;Therapeutic exercise    PT Goals (Current goals can be found in the Care Plan section)  Acute Rehab PT Goals Patient Stated Goal: back to ALF once ready PT Goal Formulation: With patient Time For Goal Achievement: 08/05/17 Potential to Achieve Goals: Good    Frequency Min 3X/week   Barriers to discharge        Co-evaluation               AM-PAC PT "6 Clicks" Daily Activity  Outcome Measure Difficulty turning over in bed (including adjusting bedclothes, sheets and blankets)?: None Difficulty moving from lying on back to sitting on the side of the bed? : None Difficulty sitting down on and standing up from a  chair with arms (e.g., wheelchair, bedside commode, etc,.)?: None Help needed moving to and from a bed to chair (including a wheelchair)?: A Little Help needed walking in hospital room?: A Little Help needed climbing 3-5 steps with a railing? : A Little 6 Click Score: 21    End of Session   Activity Tolerance: Patient tolerated treatment well Patient left: in chair(in wheelchair with transporter)   PT Visit Diagnosis: Muscle weakness (generalized) (M62.81)    Time: 1400-1410 PT Time Calculation (min) (ACUTE ONLY): 10 min   Charges:   PT Evaluation $PT Eval Moderate Complexity: 1 Mod     PT G Codes:          Rebeca Alert, MPT Pager: [phone]

## 2017-07-22 NOTE — Progress Notes (Signed)
PROGRESS NOTE    Paula Bird  ZOX:[id number] DOB: [birth year] DOA: 07/17/2017 PCP: Angela Cox, MD    Brief Narrative: Paula Bird is a 84 y.o. female with medical history significant of copd, depression, atrial fibrillation not on anticoagulation, hypertension, gerd, comes in for fever and chills. As per the patient and her son at bedside, they report persistent nausea, vomiting and diarrhea for almost 3 week now.  On arrival to ED, her labs were unremarkable except for abnormal UA.  She was referred to medical service for evaluation and management of UTI and dehydration.       Assessment & Plan:   Active Problems:   Hypertension   Depression   Paroxysmal atrial fibrillation (HCC)   COPD exacerbation (HCC)   Acid indigestion   Myasthenia gravis (HCC)   UTI (urinary tract infection)  SIRS/ Sepsis from E coli Bacteremia from UTI: - Admitted for IV antibiotics, she was started on rocephin and transitioned to oral augmentin to complete the course of 2 weeks.  The E coli in urine is sensitive to rocephin and augmentin.    Nausea vomiting and diarrhea: Diarrhea resolved.  Nausea and vomiting improved. But not completely resolved.  GI consulted and she underwent EGD today showing erythematous gastric mucosa, biopsies were taken for H pylori.  Recommendations to continue with daily PPI.  Symptomatic management with anti emetics for now.  NO nausea and vomitign today.    Atrial fib Rate controlled on amiodarone. [place] S2vasc score is 4, but she is not on anticoagulation sec to GI bleed.    Copd: Stable.    Myasthenia Gravis: Resume home meds.    Hypertension Well controlled.   Anemia: Pts baseline hemoglobin around 12, currently 10.5. She denies any bleeding or black stools. Not on anticoagulation. Suspect from hemodilution vs anemia of chronic disease.  Repeat cbc shows hemoglobin of 11.     Dehydration: improved.   Slight headache and neck  pain: She had CT head and neck done for similar complaints in the past less than year ago, was found to have arthritic changes. She reports her pain improves with pain meds. Continue with vicodin vs tramadol. This am pt reports worsening neck pain with shooting pain radiating to the back of the head associated with some dizziness, and nausea. Recommended MRI of the cervical spine, pt did nto want an MRI, she requested to increase the vicodin.  flexeril added. CT cervical spine with contrast ordered to evaluate for stenosis, tumour vs radiculopathy.    Discussed the results of the CT with the patient, she reports neck pain and dizziness is better and after PT evaluation.   DVT prophylaxis: (LOVENOX.  Code Status: DNR Family Communication: none at bedside today.    Disposition Plan: possibly back to SNF tomorrow.    Consultants:  None. gastroenterology  Procedures: EGD.    Antimicrobials: augmentin    Subjective: No vomiting today. No nausea.  Neck pain, dizziness and shooting pain from the left side of the neck.   Objective: Vitals:   07/21/17 1334 07/21/17 2100 07/22/17 0414 07/22/17 1409  BP: (!) 142/68 133/69 (!) 147/68 (!) 154/79  Pulse: 63 65 66 70  Resp: 17 19 11 16   Temp: 98 F (36.7 C) 98.1 F (36.7 C) 98.4 F (36.9 C) 97.9 F (36.6 C)  TempSrc: Oral Oral  Oral  SpO2: 97% 96%  99%  Weight:      Height:        Intake/Output Summary (Last 24  hours) at 07/22/2017 1802 Last data filed at 07/22/2017 1359 Gross per 24 hour  Intake 180 ml  Output -  Net 180 ml   Filed Weights   07/17/17 1123  Weight: 63.5 kg (140 lb)    Examination:  General exam: mild distress from neck pain.  Respiratory system: good air entry bilateral , no wheezing or rhonchi.  Cardiovascular system: S1 & S2 heard, RRR. No JVD, . No pedal edema. Gastrointestinal system:  Abdomen is soft non tender non distended bowel sounds heard.  Central nervous system: alert and answering questions  appropriately.  Extremities: Symmetric 5 x 5 power.no pedal edema.  Skin: No rashes, lesions or ulcers Psychiatry:  Mood is appropriate.     Data Reviewed: I have personally reviewed following labs and imaging studies  CBC: Recent Labs  Lab 07/17/17 1134 07/18/17 0512 07/20/17 0426 07/22/17 0409  WBC 7.3 9.6 5.5 7.6  NEUTROABS 5.3  --   --   --   HGB 12.3 10.9* 11.1* 11.5*  HCT 38.8 35.0* 35.8* 37.6  MCV 89.2 90.4 90.2 89.7  PLT 177 153 152 159   Basic Metabolic Panel: Recent Labs  Lab 07/17/17 1134 07/18/17 0512 07/20/17 0426 07/22/17 0409  NA 133* 140 140 139  K 3.6 3.7 3.3* 3.6  CL 102 110 108 107  CO2 GLUCOSE 135* 108* 113* 106*  BUN 22* CREATININE 0.58 0.52 0.45 0.51  CALCIUM 8.8* 8.3* 8.6* 8.8*   GFR: Estimated Creatinine Clearance: 39.7 mL/min (by C-G formula based on SCr of 0.51 mg/dL). Liver Function Tests: Recent Labs  Lab 07/17/17 1134  AST 18  ALT 23  ALKPHOS 52  BILITOT 0.6  PROT 6.9  ALBUMIN 3.6   No results for input(s): LIPASE, AMYLASE in the last 168 hours. No results for input(s): AMMONIA in the last 168 hours. Coagulation Profile: No results for input(s): INR, PROTIME in the last 168 hours. Cardiac Enzymes: No results for input(s): CKTOTAL, CKMB, CKMBINDEX, TROPONINI in the last 168 hours. BNP (last 3 results) No results for input(s): PROBNP in the last 8760 hours. HbA1C: No results for input(s): HGBA1C in the last 72 hours. CBG: No results for input(s): GLUCAP in the last 168 hours. Lipid Profile: No results for input(s): CHOL, HDL, LDLCALC, TRIG, CHOLHDL, LDLDIRECT in the last 72 hours. Thyroid Function Tests: No results for input(s): TSH, T4TOTAL, FREET4, T3FREE, THYROIDAB in the last 72 hours. Anemia Panel: No results for input(s): VITAMINB12, FOLATE, FERRITIN, TIBC, IRON, RETICCTPCT in the last 72 hours. Sepsis Labs: Recent Labs  Lab 07/17/17 1158  LATICACIDVEN 1.43    Recent Results (from the  past 240 hour(s))  Blood Culture (routine x 2)     Status: Abnormal   Collection Time: 07/17/17 11:48 AM  Result Value Ref Range Status   Specimen Description   Final    BLOOD LEFT FOREARM Performed at [facility], [address] [place], [place] [address]    Special Requests   Final    BOTTLES DRAWN AEROBIC AND ANAEROBIC Blood Culture adequate volume Performed at [facility], [address] [place], [place] [address]    Culture  Setup Time   Final    ANAEROBIC BOTTLE ONLY GRAM NEGATIVE RODS CRITICAL RESULT CALLED TO, READ BACK BY AND VERIFIED WITH: Sophronia Simas [facility] 07/18/17 0981 JDW Performed at [facility], [address] [place], [place] [address]    Culture ESCHERICHIA COLI (A)  Final   Report Status 07/20/2017 FINAL  Final   Organism ID, Bacteria ESCHERICHIA COLI  Final      Susceptibility   Escherichia coli - MIC*    AMPICILLIN 4 SENSITIVE Sensitive     CEFAZOLIN <=4 SENSITIVE Sensitive     CEFEPIME <=1 SENSITIVE Sensitive     CEFTAZIDIME <=1 SENSITIVE Sensitive     CEFTRIAXONE <=1 SENSITIVE Sensitive     CIPROFLOXACIN <=0.25 SENSITIVE Sensitive     GENTAMICIN <=1 SENSITIVE Sensitive     IMIPENEM <=0.25 SENSITIVE Sensitive     TRIMETH/SULFA <=20 SENSITIVE Sensitive     AMPICILLIN/SULBACTAM <=2 SENSITIVE Sensitive     PIP/TAZO <=4 SENSITIVE Sensitive     Extended ESBL NEGATIVE Sensitive     * ESCHERICHIA COLI  Blood Culture ID Panel (Reflexed)     Status: Abnormal   Collection Time: 07/17/17 11:48 AM  Result Value Ref Range Status   Enterococcus species NOT DETECTED NOT DETECTED Final   Listeria monocytogenes NOT DETECTED NOT DETECTED Final   Staphylococcus species NOT DETECTED NOT DETECTED Final   Staphylococcus aureus NOT DETECTED NOT DETECTED Final   Streptococcus species NOT DETECTED NOT DETECTED Final   Streptococcus agalactiae NOT DETECTED NOT DETECTED Final   Streptococcus pneumoniae NOT DETECTED NOT DETECTED Final    Streptococcus pyogenes NOT DETECTED NOT DETECTED Final   Acinetobacter baumannii NOT DETECTED NOT DETECTED Final   Enterobacteriaceae species DETECTED (A) NOT DETECTED Final    Comment: Enterobacteriaceae represent a large family of gram-negative bacteria, not a single organism. CRITICAL RESULT CALLED TO, READ BACK BY AND VERIFIED WITH: M BELL PHARMD 07/18/17 0306 JDW    Enterobacter cloacae complex NOT DETECTED NOT DETECTED Final   Escherichia coli DETECTED (A) NOT DETECTED Final    Comment: CRITICAL RESULT CALLED TO, READ BACK BY AND VERIFIED WITH:  Judie Petit BELL PHARMD 07/18/17 0306 JDW    Klebsiella oxytoca NOT DETECTED NOT DETECTED Final   Klebsiella pneumoniae NOT DETECTED NOT DETECTED Final   Proteus species NOT DETECTED NOT DETECTED Final   Serratia marcescens NOT DETECTED NOT DETECTED Final   Carbapenem resistance NOT DETECTED NOT DETECTED Final   Haemophilus influenzae NOT DETECTED NOT DETECTED Final   Neisseria meningitidis NOT DETECTED NOT DETECTED Final   Pseudomonas aeruginosa NOT DETECTED NOT DETECTED Final   Candida albicans NOT DETECTED NOT DETECTED Final   Candida glabrata NOT DETECTED NOT DETECTED Final   Candida krusei NOT DETECTED NOT DETECTED Final   Candida parapsilosis NOT DETECTED NOT DETECTED Final   Candida tropicalis NOT DETECTED NOT DETECTED Final  Blood Culture (routine x 2)     Status: None   Collection Time: 07/17/17 11:52 AM  Result Value Ref Range Status   Specimen Description   Final    BLOOD LEFT HAND Performed at [facility], [address], [place], [place] [address]    Special Requests   Final    BOTTLES DRAWN AEROBIC AND ANAEROBIC Blood Culture adequate volume Performed at [facility], [address] [place], [place] [address]    Culture   Final    NO GROWTH 5 DAYS Performed at [facility], [address] [place], [place] [address]    Report Status 07/22/2017 FINAL  Final  Urine culture      Status: Abnormal   Collection Time: 07/17/17  3:31 PM  Result Value Ref Range Status   Specimen Description   Final    URINE, CLEAN CATCH Performed at [facility], [address], [place], [place] [address]    Special Requests   Final    NONE Performed at [facility], [address], [place], [place] [address]    Culture >=100,000 COLONIES/mL ESCHERICHIA COLI (A)  Final   Report Status 07/19/2017 FINAL  Final   Organism ID, Bacteria ESCHERICHIA COLI (A)  Final      Susceptibility   Escherichia coli - MIC*    AMPICILLIN 4 SENSITIVE Sensitive     CEFAZOLIN <=4 SENSITIVE Sensitive     CEFTRIAXONE <=1 SENSITIVE Sensitive     CIPROFLOXACIN <=0.25 SENSITIVE Sensitive     GENTAMICIN <=1 SENSITIVE Sensitive     IMIPENEM <=0.25 SENSITIVE Sensitive     NITROFURANTOIN <=16 SENSITIVE Sensitive     TRIMETH/SULFA <=20 SENSITIVE Sensitive     AMPICILLIN/SULBACTAM <=2 SENSITIVE Sensitive     PIP/TAZO <=4 SENSITIVE Sensitive     Extended ESBL NEGATIVE Sensitive     * >=100,000 COLONIES/mL ESCHERICHIA COLI  MRSA PCR Screening     Status: None   Collection Time: 07/18/17  1:47 AM  Result Value Ref Range Status   MRSA by PCR NEGATIVE NEGATIVE Final    Comment:        The GeneXpert MRSA Assay (FDA approved for NASAL specimens only), is one component of a comprehensive MRSA colonization surveillance program. It is not intended to diagnose MRSA infection nor to guide or monitor treatment for MRSA infections. Performed at [facility], [address], [place], [place] [address]          Radiology Studies: Ct Cervical Spine W Contrast  Result Date: 07/22/2017 CLINICAL DATA:  Multiple falls. Neck pain, chronic. Left neck and head pain for 6 days. EXAM: CT CERVICAL SPINE WITH CONTRAST TECHNIQUE: Multidetector CT imaging of the cervical spine was performed during intravenous contrast administration. Multiplanar CT image  reconstructions were also generated. CONTRAST:  75mL OMNIPAQUE IOHEXOL 300 MG/ML  SOLN COMPARISON:  CT of cervical spine 11/03/2016. FINDINGS: Patient was initially scanned without contrast. However, the study was ordered with contrast only. Patient was subsequently brought back for with contrast imaging. The postcontrast images demonstrate no pathologic enhancement within the spinal canal. Despite extensive foraminal disease, no significant central canal stenosis is evident. No Hansen soft tissue mass is present in the neck. Alignment: Grade 1 anterolisthesis at C6-7 is stable. AP alignment is otherwise anatomic. Skull base and vertebrae: The craniocervical junction is normal. No acute or healing fractures are present. Soft tissues and spinal canal: Atherosclerotic calcifications at the carotid bifurcations are stable bilaterally. A multinodular thyroid goiter is stable. Disc levels: C2-3: Asymmetric right-sided facet hypertrophy contributes to mild right foraminal narrowing. C3-4: Advanced facet hypertrophy is present bilaterally with moderate foraminal stenosis bilaterally. C4-5: Asymmetric right-sided facet hypertrophy is present moderate right foraminal stenosis. C5-6: Asymmetric left-sided facet hypertrophy is present mild left foraminal narrowing. C6-7: Uncovertebral and facet hypertrophy contribute to severe right moderate left foraminal stenosis. C7-T1: Congenital or acquired fusion anteriorly and posteriorly is stable. Upper chest: The lung apices are clear. Thoracic inlet is otherwise normal. IMPRESSION: 1. No acute or healing fractures. 2. Stable multilevel facet hypertrophy with foraminal narrowing as described. 3. Stable chronic anterolisthesis at C6-7 adjacent to congenital or acquired fusion at C7-T1. Electronically Signed   By: Marin Roberts M.D.   On: 07/22/2017 15:37        Scheduled Meds: . amiodarone  200 mg Oral Daily  . amitriptyline  50 mg Oral QHS  .  amoxicillin-clavulanate   1 tablet Oral Q12H  . diltiazem  300 mg Oral Daily  . diphenhydrAMINE  25 mg Intravenous Once  . enoxaparin (LOVENOX) injection  40 mg Subcutaneous Q24H  . fluticasone furoate-vilanterol  1 puff Inhalation Daily  . ketorolac  30 mg Intravenous Once  . mycophenolate  500 mg Oral BID  . pantoprazole  40 mg Oral BID  . prochlorperazine  10 mg Intravenous Once  . sucralfate  1 g Oral TID WC & HS  . vortioxetine HBr  10 mg Oral Daily  . zolpidem  5 mg Oral QHS   Continuous Infusions:    LOS: 4 days    Time spent: 35 minutes.     Kathlen Mody, MD [facility] Pager [phone]  If 7PM-7AM, please contact night-coverage [email or web address] Password [facility] 07/22/2017, 6:02 PM

## 2017-07-23 MED ORDER — AMOXICILLIN-POT CLAVULANATE 875-125 MG PO TABS: 1.0000 | ORAL_TABLET | Freq: Two times a day (BID) | ORAL | 0 refills | Status: AC

## 2017-07-23 MED ORDER — CYCLOBENZAPRINE HCL 5 MG PO TABS: 5.0000 mg | ORAL_TABLET | Freq: Three times a day (TID) | ORAL | 0 refills | Status: DC | PRN

## 2017-07-23 MED ORDER — SUCRALFATE 1 GM/10ML PO SUSP: 1.0000 g | Freq: Three times a day (TID) | ORAL | 0 refills | Status: DC

## 2017-07-23 NOTE — Progress Notes (Signed)
Patient is being taken to [facility] by son. Call made to [facility] to give report; spoke with [place]. Questions answered; [place] denies further questions. Lina Sar, RN

## 2017-07-23 NOTE — Progress Notes (Addendum)
CSW discussed patient discharge information with nursing staff Alcario Drought. She reports the fax number listed on HUB is incorrect. CSW faxed  FL2 form via Epic to [phone].   CSW sent patient discharge summary via Epic  CSW informed patient son-Guy about discharge. He plans to transport the patient.   Nurse call report to: [phone]    Earnestine Leys, MSW Clinical Social Worker  [phone] 07/23/2017  2:01 PM

## 2017-07-23 NOTE — Progress Notes (Signed)
Patient with severe heaves and vomited a small amount. Nauseated. Zofran given.  Dr Blake Divine notified, as MD is considering DC back to SNF. Lina Sar, RN

## 2017-07-23 NOTE — Discharge Summary (Signed)
Physician Discharge Summary  Paula Bird WUJ:[id number] DOB: 1925/09/06 DOA: 07/17/2017  PCP: Angela Cox, MD  Admit date: 07/17/2017 Discharge date: 07/23/2017  Admitted From: ALF. Disposition:  ALF with home health.   Recommendations for Outpatient Follow-up:  1. Follow up with PCP in 1-2 weeks 2. Please obtain BMP/CBC in one week Please follow up with gastroenterology as recommended.   Discharge Condition:STABLE.  CODE STATUS: DNR Diet recommendation: Heart Healthy   Brief/Interim Summary:  Paula Bird a 84 y.o.femalewith medical history significant ofcopd, depression, atrial fibrillation not on anticoagulation, hypertension, gerd, comes in for fever and chills. As per the patient and her son at bedside, they report persistent nausea, vomiting and diarrhea for almost 3 week now.  On arrival to ED, her labs were unremarkable except for abnormal UA.  She was referred to medical service for evaluation and management of UTI and dehydration.    Discharge Diagnoses:  Active Problems:   Hypertension   Depression   Paroxysmal atrial fibrillation (HCC)   COPD exacerbation (HCC)   Acid indigestion   Myasthenia gravis (HCC)   UTI (urinary tract infection)  SIRS/ Sepsis from E coli Bacteremia from UTI: - Admitted for IV antibiotics, she was started on rocephin and transitioned to oral augmentin to complete the course of 2 weeks.  The E coli in urine is sensitive to rocephin and augmentin.    Nausea vomiting and diarrhea: Diarrhea resolved.  Nausea and vomiting improved. But not completely resolved.  GI consulted and she underwent EGD today showing erythematous gastric mucosa, biopsies were taken for H pylori.  Recommendations to continue with daily PPI.  Symptomatic management with anti emetics for now.  NO nausea and vomitign today.    Atrial fib Rate controlled on amiodarone. [place] S2vasc score is 4, but she is not on anticoagulation sec to GI  bleed.    Copd: Stable.    Myasthenia Gravis: Resume home meds.    Hypertension Well controlled.   Anemia: Pts baseline hemoglobin around 12, currently 10.5. She denies any bleeding or black stools. Not on anticoagulation. Suspect from hemodilution vs anemia of chronic disease.  Repeat cbc shows hemoglobin of 11.     Dehydration: improved.   Slight headache and neck pain: She had CT head and neck done for similar complaints in the past less than year ago, was found to have arthritic changes. She reports her pain improves with pain meds. Continue with vicodin vs tramadol. On 4/30  pt reports worsening neck pain with shooting pain radiating to the back of the head associated with some dizziness, and nausea. Recommended MRI of the cervical spine, pt did not want an MRI, she requested to increase the vicodin.  flexeril added. CT cervical spine with contrast ordered to evaluate for stenosis, tumour vs radiculopathy.    Discussed the results of the CT with the patient, she reports neck pain and dizziness is better and after PT evaluation.      Discharge Instructions  Discharge Instructions    Diet - low sodium heart healthy   Complete by:  As directed    Discharge instructions   Complete by:  As directed    Follow up with PCP in one week.  Please follow up with GI as recommended.     Allergies as of 07/23/2017   No Known Allergies     Medication List    STOP taking these medications   benzonatate 100 MG capsule Commonly known as:  TESSALON   esomeprazole 20 MG  capsule Commonly known as:  NEXIUM   guaiFENesin 600 MG 12 hr tablet Commonly known as:  MUCINEX   hydrochlorothiazide 25 MG tablet Commonly known as:  HYDRODIURIL   SENEXON 8.6 MG tablet Generic drug:  senna     TAKE these medications   amiodarone 200 MG tablet Commonly known as:  PACERONE Take 200 mg by mouth daily.   amitriptyline 50 MG tablet Commonly known as:  ELAVIL Take 50  mg by mouth at bedtime.   amoxicillin-clavulanate 875-125 MG tablet Commonly known as:  AUGMENTIN Take 1 tablet by mouth every 12 (twelve) hours for 7 days.   aspirin EC 81 MG tablet Take 81 mg by mouth daily.   bisacodyl 10 MG suppository Commonly known as:  DULCOLAX Place 10 mg rectally daily as needed for moderate constipation.   BREO ELLIPTA 100-25 MCG/INH Aepb Generic drug:  fluticasone furoate-vilanterol Take 1 puff by mouth daily.   calcium-vitamin D 500-200 MG-UNIT tablet Commonly known as:  OSCAL WITH D Take 1 tablet by mouth 2 (two) times daily.   carboxymethylcellulose 0.5 % Soln Commonly known as:  REFRESH PLUS Place 2 drops into both eyes 2 (two) times daily as needed (dry eyes).   Cranberry 425 MG Caps Take 425 mg by mouth 2 (two) times daily.   cyclobenzaprine 5 MG tablet Commonly known as:  FLEXERIL Take 1 tablet (5 mg total) by mouth 3 (three) times daily as needed for muscle spasms.   diltiazem 300 MG 24 hr tablet Commonly known as:  CARDIZEM LA Take 300 mg by mouth daily.   HYDROcodone-acetaminophen 5-325 MG tablet Commonly known as:  NORCO/VICODIN Take 1 tablet by mouth every 6 (six) hours as needed for moderate pain. For pain   INCRUSE ELLIPTA 62.5 MCG/INH Aepb Generic drug:  umeclidinium bromide Take 1 puff by mouth daily.   lisinopril 40 MG tablet Commonly known as:  PRINIVIL,ZESTRIL Take 40 mg by mouth daily.   loperamide 2 MG tablet Commonly known as:  IMODIUM A-D Take 2 mg by mouth every 6 (six) hours as needed for diarrhea or loose stools.   mycophenolate 500 MG tablet Commonly known as:  CELLCEPT Take 1 tablet (500 mg total) by mouth 2 (two) times daily.   ondansetron 4 MG tablet Commonly known as:  ZOFRAN Take 1 tablet (4 mg total) by mouth every 8 (eight) hours as needed for nausea or vomiting.   OVER THE COUNTER MEDICATION Take 1 tablet by mouth daily. Vitafusion MVI adult gummies   pantoprazole 40 MG tablet Commonly known  as:  PROTONIX Take 1 tablet (40 mg total) by mouth 2 (two) times daily.   polyethylene glycol packet Commonly known as:  MIRALAX / GLYCOLAX Take 17 g by mouth daily. What changed:  when to take this   promethazine 25 MG tablet Commonly known as:  PHENERGAN Take 25 mg by mouth every 6 (six) hours as needed. for GERD   sennosides-docusate sodium 8.6-50 MG tablet Commonly known as:  SENOKOT-S Take 1 tablet by mouth 2 (two) times daily.   spironolactone 25 MG tablet Commonly known as:  ALDACTONE Take 25 mg by mouth daily.   sucralfate 1 GM/10ML suspension Commonly known as:  CARAFATE Take 10 mLs (1 g total) by mouth 4 (four) times daily -  with meals and at bedtime.   SUMAtriptan 25 MG tablet Commonly known as:  IMITREX Take 50 mg by mouth daily as needed for migraine. May repeat in 2 hours if headache persists or recurs.  TRINTELLIX 10 MG Tabs tablet Generic drug:  vortioxetine HBr Take 10 mg by mouth daily.   Vitamin D3 2000 units Tabs Take 2,000 Units by mouth daily.   zolpidem 5 MG tablet Commonly known as:  AMBIEN 5 mg at bedtime.      Contact information for after-discharge care    Destination    [facility]:  Assisted Living Contact information: [address] [place] [address] [phone]             No Known Allergies  Consultations:  Gastroenterology.    Procedures/Studies: Dg Chest 2 View  Result Date: 07/17/2017 CLINICAL DATA:  Cough and shortness of breath. EXAM: CHEST - 2 VIEW COMPARISON:  04/22/2017 FINDINGS: Lungs are hyperexpanded. Interstitial markings are diffusely coarsened with chronic features. Heart size upper normal. Stable basilar scarring bilaterally. Bones are diffusely demineralized. Telemetry leads overlie the chest. IMPRESSION: Stable.  Hyperexpansion without acute cardiopulmonary findings. Electronically Signed   By: Kennith Center M.D.   On: 07/17/2017 13:10   Dg  Abd 1 View  Result Date: 07/20/2017 CLINICAL DATA:  Vomiting. EXAM: ABDOMEN - 1 VIEW COMPARISON:  CT scan July 04, 2017 FINDINGS: Vertebroplasties at T12 and L2. Soft tissue calcifications noted in both flanks. Left hip replacement. Calcifications in the pelvis are likely phleboliths. No renal stones identified. No evidence of bowel obstruction. Lung bases are normal. IMPRESSION: No acute abnormalities. Electronically Signed   By: Gerome Sam III M.D   On: 07/20/2017 18:00   Ct Cervical Spine W Contrast  Result Date: 07/22/2017 CLINICAL DATA:  Multiple falls. Neck pain, chronic. Left neck and head pain for 6 days. EXAM: CT CERVICAL SPINE WITH CONTRAST TECHNIQUE: Multidetector CT imaging of the cervical spine was performed during intravenous contrast administration. Multiplanar CT image reconstructions were also generated. CONTRAST:  75mL OMNIPAQUE IOHEXOL 300 MG/ML  SOLN COMPARISON:  CT of cervical spine 11/03/2016. FINDINGS: Patient was initially scanned without contrast. However, the study was ordered with contrast only. Patient was subsequently brought back for with contrast imaging. The postcontrast images demonstrate no pathologic enhancement within the spinal canal. Despite extensive foraminal disease, no significant central canal stenosis is evident. No Hansen soft tissue mass is present in the neck. Alignment: Grade 1 anterolisthesis at C6-7 is stable. AP alignment is otherwise anatomic. Skull base and vertebrae: The craniocervical junction is normal. No acute or healing fractures are present. Soft tissues and spinal canal: Atherosclerotic calcifications at the carotid bifurcations are stable bilaterally. A multinodular thyroid goiter is stable. Disc levels: C2-3: Asymmetric right-sided facet hypertrophy contributes to mild right foraminal narrowing. C3-4: Advanced facet hypertrophy is present bilaterally with moderate foraminal stenosis bilaterally. C4-5: Asymmetric right-sided facet hypertrophy  is present moderate right foraminal stenosis. C5-6: Asymmetric left-sided facet hypertrophy is present mild left foraminal narrowing. C6-7: Uncovertebral and facet hypertrophy contribute to severe right moderate left foraminal stenosis. C7-T1: Congenital or acquired fusion anteriorly and posteriorly is stable. Upper chest: The lung apices are clear. Thoracic inlet is otherwise normal. IMPRESSION: 1. No acute or healing fractures. 2. Stable multilevel facet hypertrophy with foraminal narrowing as described. 3. Stable chronic anterolisthesis at C6-7 adjacent to congenital or acquired fusion at C7-T1. Electronically Signed   By: Marin Roberts M.D.   On: 07/22/2017 15:37   Ct Abdomen Pelvis W Contrast  Result Date: 07/04/2017 CLINICAL DATA:  Intermittent nausea, vomiting, and diarrhea for the past 2 weeks, worsened over the past 3 days. Generalized abdominal pain.  EXAM: CT ABDOMEN AND PELVIS WITH CONTRAST TECHNIQUE: Multidetector CT imaging of the abdomen and pelvis was performed using the standard protocol following bolus administration of intravenous contrast. CONTRAST:  ISOVUE-300 IOPAMIDOL (ISOVUE-300) INJECTION 61% COMPARISON:  None. FINDINGS: Lower chest: No acute abnormality.  Scarring at the lung bases. Hepatobiliary: No focal liver abnormality is seen. No gallstones, gallbladder wall thickening, or biliary dilatation. Pancreas: Mild atrophy. No ductal dilatation or surrounding inflammatory changes. Spleen: Normal in size. There is a 1.9 cm indeterminate, enhancing lesion within the spleen. Adrenals/Urinary Tract: There is a 1.1 cm indeterminate lesion within the left adrenal gland. The right adrenal gland is unremarkable. The bilateral kidneys are unremarkable. No renal or ureteral calculi. No hydronephrosis. The bladder is unremarkable. Stomach/Bowel: Stomach is within normal limits. Appendix is not visualized in this patient with a history of prior appendectomy. No evidence of bowel wall  thickening, distention, or inflammatory changes. Vascular/Lymphatic: Aortic atherosclerosis. No enlarged abdominal or pelvic lymph nodes. Reproductive: Status post hysterectomy. No adnexal masses. Other: No abdominal wall hernia or abnormality. No abdominopelvic ascites. No pneumoperitoneum. Musculoskeletal: Prior left total hip arthroplasty. Old healed right superior and inferior pubic rami fractures. Chronic compression deformities of T12 and L1. Prior cement augmentation of T12 and L2. Moderate osteoarthritis of the right hip. IMPRESSION: 1.  No acute intra-abdominal process. 2. 1.9 cm indeterminate lesion within the spleen. Recommend follow-up contrast-enhanced MRI in 6-12 months for further evaluation. This recommendation follows ACR consensus guidelines: White Paper of the ACR Incidental Findings Committee II on Splenic and Nodal Findings. J Am Coll Radiol 2013;10:789-794. 3. Indeterminate 1.1 cm nodule within the left adrenal gland, probably a benign adenoma. Consider follow-up adrenal protocol CT in 12 months for further evaluation. This recommendation follows ACR consensus guidelines: Management of Incidental Adrenal Masses: A White Paper of the ACR Incidental Findings Committee. J Am Coll Radiol 2017;14:1038-1044. 4.  Aortic atherosclerosis (ICD10-I70.0). Electronically Signed   By: Obie Dredge M.D.   On: 07/04/2017 20:50      Subjective: Some nausea, and dry heaving, which has resolved. She was able to tolerate lunch without any issues.   Discharge Exam: Vitals:   07/22/17 2123 07/23/17 0601  BP: (!) 124/56 (!) 146/63  Pulse: 64 (!) 59  Resp: 18 16  Temp: 97.7 F (36.5 C) 97.8 F (36.6 C)  SpO2: 97% 96%   Vitals:   07/22/17 0414 07/22/17 1409 07/22/17 2123 07/23/17 0601  BP: (!) 147/68 (!) 154/79 (!) 124/56 (!) 146/63  Pulse: 66 70 64 (!) 59  Resp: Temp: 98.4 F (36.9 C) 97.9 F (36.6 C) 97.7 F (36.5 C) 97.8 F (36.6 C)  TempSrc:  Oral Oral Oral  SpO2:  99%  97% 96%  Weight:      Height:        General: Pt is alert, awake, not in acute distress Cardiovascular: RRR, S1/S2 +, no rubs, no gallops Respiratory: CTA bilaterally, no wheezing, no rhonchi Abdominal: Soft, NT, ND, bowel sounds + Extremities: no edema, no cyanosis    The results of significant diagnostics from this hospitalization (including imaging, microbiology, ancillary and laboratory) are listed below for reference.     Microbiology: Recent Results (from the past 240 hour(s))  Blood Culture (routine x 2)     Status: Abnormal   Collection Time: 07/17/17 11:48 AM  Result Value Ref Range Status   Specimen Description   Final    BLOOD LEFT FOREARM Performed at [facility], [address] [place], [place] [address]    Special Requests   Final    BOTTLES DRAWN AEROBIC AND ANAEROBIC Blood Culture adequate volume Performed at [facility], [address] [place], [place] [address]    Culture  Setup Time   Final    ANAEROBIC BOTTLE ONLY GRAM NEGATIVE RODS CRITICAL RESULT CALLED TO, READ BACK BY AND VERIFIED WITH: Sophronia Simas [facility] 07/18/17 0981 JDW Performed at [facility], [address] [place], [place] [address]    Culture ESCHERICHIA COLI (A)  Final   Report Status 07/20/2017 FINAL  Final   Organism ID, Bacteria ESCHERICHIA COLI  Final      Susceptibility   Escherichia coli - MIC*    AMPICILLIN 4 SENSITIVE Sensitive     CEFAZOLIN <=4 SENSITIVE Sensitive     CEFEPIME <=1 SENSITIVE Sensitive     CEFTAZIDIME <=1 SENSITIVE Sensitive     CEFTRIAXONE <=1 SENSITIVE Sensitive     CIPROFLOXACIN <=0.25 SENSITIVE Sensitive     GENTAMICIN <=1 SENSITIVE Sensitive     IMIPENEM <=0.25 SENSITIVE Sensitive     TRIMETH/SULFA <=20 SENSITIVE Sensitive     AMPICILLIN/SULBACTAM <=2 SENSITIVE Sensitive     PIP/TAZO <=4 SENSITIVE Sensitive     Extended ESBL NEGATIVE Sensitive     * ESCHERICHIA COLI  Blood Culture ID Panel (Reflexed)      Status: Abnormal   Collection Time: 07/17/17 11:48 AM  Result Value Ref Range Status   Enterococcus species NOT DETECTED NOT DETECTED Final   Listeria monocytogenes NOT DETECTED NOT DETECTED Final   Staphylococcus species NOT DETECTED NOT DETECTED Final   Staphylococcus aureus NOT DETECTED NOT DETECTED Final   Streptococcus species NOT DETECTED NOT DETECTED Final   Streptococcus agalactiae NOT DETECTED NOT DETECTED Final   Streptococcus pneumoniae NOT DETECTED NOT DETECTED Final   Streptococcus pyogenes NOT DETECTED NOT DETECTED Final   Acinetobacter baumannii NOT DETECTED NOT DETECTED Final   Enterobacteriaceae species DETECTED (A) NOT DETECTED Final    Comment: Enterobacteriaceae represent a large family of gram-negative bacteria, not a single organism. CRITICAL RESULT CALLED TO, READ BACK BY AND VERIFIED WITH: M BELL PHARMD 07/18/17 0306 JDW    Enterobacter cloacae complex NOT DETECTED NOT DETECTED Final   Escherichia coli DETECTED (A) NOT DETECTED Final    Comment: CRITICAL RESULT CALLED TO, READ BACK BY AND VERIFIED WITH:  Judie Petit BELL PHARMD 07/18/17 0306 JDW    Klebsiella oxytoca NOT DETECTED NOT DETECTED Final   Klebsiella pneumoniae NOT DETECTED NOT DETECTED Final   Proteus species NOT DETECTED NOT DETECTED Final   Serratia marcescens NOT DETECTED NOT DETECTED Final   Carbapenem resistance NOT DETECTED NOT DETECTED Final   Haemophilus influenzae NOT DETECTED NOT DETECTED Final   Neisseria meningitidis NOT DETECTED NOT DETECTED Final   Pseudomonas aeruginosa NOT DETECTED NOT DETECTED Final   Candida albicans NOT DETECTED NOT DETECTED Final   Candida glabrata NOT DETECTED NOT DETECTED Final   Candida krusei NOT DETECTED NOT DETECTED Final   Candida parapsilosis NOT DETECTED NOT DETECTED Final   Candida tropicalis NOT DETECTED NOT DETECTED Final  Blood Culture (routine x 2)     Status: None   Collection Time: 07/17/17 11:52 AM  Result Value Ref Range Status   Specimen Description    Final    BLOOD LEFT HAND Performed at [facility], [address], [place], [place] [address]    Special Requests   Final    BOTTLES DRAWN AEROBIC  AND ANAEROBIC Blood Culture adequate volume Performed at [facility], [address], [place], [place] [address]    Culture   Final    NO GROWTH 5 DAYS Performed at [facility], [address] [place], [place] [address]    Report Status 07/22/2017 FINAL  Final  Urine culture     Status: Abnormal   Collection Time: 07/17/17  3:31 PM  Result Value Ref Range Status   Specimen Description   Final    URINE, CLEAN CATCH Performed at [facility], [address] [place], [place] [address]    Special Requests   Final    NONE Performed at [facility], [address], [place], [place] [address]    Culture >=100,000 COLONIES/mL ESCHERICHIA COLI (A)  Final   Report Status 07/19/2017 FINAL  Final   Organism ID, Bacteria ESCHERICHIA COLI (A)  Final      Susceptibility   Escherichia coli - MIC*    AMPICILLIN 4 SENSITIVE Sensitive     CEFAZOLIN <=4 SENSITIVE Sensitive     CEFTRIAXONE <=1 SENSITIVE Sensitive     CIPROFLOXACIN <=0.25 SENSITIVE Sensitive     GENTAMICIN <=1 SENSITIVE Sensitive     IMIPENEM <=0.25 SENSITIVE Sensitive     NITROFURANTOIN <=16 SENSITIVE Sensitive     TRIMETH/SULFA <=20 SENSITIVE Sensitive     AMPICILLIN/SULBACTAM <=2 SENSITIVE Sensitive     PIP/TAZO <=4 SENSITIVE Sensitive     Extended ESBL NEGATIVE Sensitive     * >=100,000 COLONIES/mL ESCHERICHIA COLI  MRSA PCR Screening     Status: None   Collection Time: 07/18/17  1:47 AM  Result Value Ref Range Status   MRSA by PCR NEGATIVE NEGATIVE Final    Comment:        The GeneXpert MRSA Assay (FDA approved for NASAL specimens only), is one component of a comprehensive MRSA colonization surveillance program. It is not intended to diagnose MRSA infection nor to guide or monitor  treatment for MRSA infections. Performed at [facility], [address], [place], [place] [address]      Labs: BNP (last 3 results) Recent Labs    04/22/17 2002  BNP 29.1   Basic Metabolic Panel: Recent Labs  Lab 07/17/17 1134 07/18/17 0512 07/20/17 0426 07/22/17 0409  NA 133* 140 140 139  K 3.6 3.7 3.3* 3.6  CL 102 110 108 107  CO2 GLUCOSE 135* 108* 113* 106*  BUN 22* CREATININE 0.58 0.52 0.45 0.51  CALCIUM 8.8* 8.3* 8.6* 8.8*   Liver Function Tests: Recent Labs  Lab 07/17/17 1134  AST 18  ALT 23  ALKPHOS 52  BILITOT 0.6  PROT 6.9  ALBUMIN 3.6   No results for input(s): LIPASE, AMYLASE in the last 168 hours. No results for input(s): AMMONIA in the last 168 hours. CBC: Recent Labs  Lab 07/17/17 1134 07/18/17 0512 07/20/17 0426 07/22/17 0409  WBC 7.3 9.6 5.5 7.6  NEUTROABS 5.3  --   --   --   HGB 12.3 10.9* 11.1* 11.5*  HCT 38.8 35.0* 35.8* 37.6  MCV 89.2 90.4 90.2 89.7  PLT 177 153 152 159   Cardiac Enzymes: No results for input(s): CKTOTAL, CKMB, CKMBINDEX, TROPONINI in the last 168 hours. BNP: Invalid input(s): POCBNP CBG: No results for input(s): GLUCAP in the last 168 hours. D-Dimer No results for input(s): DDIMER in the last 72 hours. Hgb A1c No results for  input(s): HGBA1C in the last 72 hours. Lipid Profile No results for input(s): CHOL, HDL, LDLCALC, TRIG, CHOLHDL, LDLDIRECT in the last 72 hours. Thyroid function studies No results for input(s): TSH, T4TOTAL, T3FREE, THYROIDAB in the last 72 hours.  Invalid input(s): FREET3 Anemia work up No results for input(s): VITAMINB12, FOLATE, FERRITIN, TIBC, IRON, RETICCTPCT in the last 72 hours. Urinalysis    Component Value Date/Time   COLORURINE YELLOW 07/17/2017 1531   APPEARANCEUR HAZY (A) 07/17/2017 1531   LABSPEC 1.010 07/17/2017 1531   PHURINE 7.0 07/17/2017 1531   GLUCOSEU NEGATIVE 07/17/2017 1531   HGBUR SMALL (A) 07/17/2017 1531    BILIRUBINUR NEGATIVE 07/17/2017 1531   KETONESUR NEGATIVE 07/17/2017 1531   PROTEINUR NEGATIVE 07/17/2017 1531   UROBILINOGEN 0.2 08/13/2014 2256   NITRITE NEGATIVE 07/17/2017 1531   LEUKOCYTESUR MODERATE (A) 07/17/2017 1531   Sepsis Labs Invalid input(s): PROCALCITONIN,  WBC,  LACTICIDVEN Microbiology Recent Results (from the past 240 hour(s))  Blood Culture (routine x 2)     Status: Abnormal   Collection Time: 07/17/17 11:48 AM  Result Value Ref Range Status   Specimen Description   Final    BLOOD LEFT FOREARM Performed at [facility], [address], [place], [place] [address]    Special Requests   Final    BOTTLES DRAWN AEROBIC AND ANAEROBIC Blood Culture adequate volume Performed at [facility], [address] [place], [place] [address]    Culture  Setup Time   Final    ANAEROBIC BOTTLE ONLY GRAM NEGATIVE RODS CRITICAL RESULT CALLED TO, READ BACK BY AND VERIFIED WITH: Sophronia Simas [facility] 07/18/17 0981 JDW Performed at [facility], [address] [place], [place] [address]    Culture ESCHERICHIA COLI (A)  Final   Report Status 07/20/2017 FINAL  Final   Organism ID, Bacteria ESCHERICHIA COLI  Final      Susceptibility   Escherichia coli - MIC*    AMPICILLIN 4 SENSITIVE Sensitive     CEFAZOLIN <=4 SENSITIVE Sensitive     CEFEPIME <=1 SENSITIVE Sensitive     CEFTAZIDIME <=1 SENSITIVE Sensitive     CEFTRIAXONE <=1 SENSITIVE Sensitive     CIPROFLOXACIN <=0.25 SENSITIVE Sensitive     GENTAMICIN <=1 SENSITIVE Sensitive     IMIPENEM <=0.25 SENSITIVE Sensitive     TRIMETH/SULFA <=20 SENSITIVE Sensitive     AMPICILLIN/SULBACTAM <=2 SENSITIVE Sensitive     PIP/TAZO <=4 SENSITIVE Sensitive     Extended ESBL NEGATIVE Sensitive     * ESCHERICHIA COLI  Blood Culture ID Panel (Reflexed)     Status: Abnormal   Collection Time: 07/17/17 11:48 AM  Result Value Ref Range Status   Enterococcus species NOT DETECTED NOT DETECTED Final    Listeria monocytogenes NOT DETECTED NOT DETECTED Final   Staphylococcus species NOT DETECTED NOT DETECTED Final   Staphylococcus aureus NOT DETECTED NOT DETECTED Final   Streptococcus species NOT DETECTED NOT DETECTED Final   Streptococcus agalactiae NOT DETECTED NOT DETECTED Final   Streptococcus pneumoniae NOT DETECTED NOT DETECTED Final   Streptococcus pyogenes NOT DETECTED NOT DETECTED Final   Acinetobacter baumannii NOT DETECTED NOT DETECTED Final   Enterobacteriaceae species DETECTED (A) NOT DETECTED Final    Comment: Enterobacteriaceae represent a large family of gram-negative bacteria, not a single organism. CRITICAL RESULT CALLED TO, READ BACK BY AND VERIFIED WITH: M BELL PHARMD 07/18/17 0306 JDW    Enterobacter cloacae complex NOT DETECTED NOT DETECTED Final   Escherichia coli DETECTED (  A) NOT DETECTED Final    Comment: CRITICAL RESULT CALLED TO, READ BACK BY AND VERIFIED WITH:  Judie Petit BELL PHARMD 07/18/17 0306 JDW    Klebsiella oxytoca NOT DETECTED NOT DETECTED Final   Klebsiella pneumoniae NOT DETECTED NOT DETECTED Final   Proteus species NOT DETECTED NOT DETECTED Final   Serratia marcescens NOT DETECTED NOT DETECTED Final   Carbapenem resistance NOT DETECTED NOT DETECTED Final   Haemophilus influenzae NOT DETECTED NOT DETECTED Final   Neisseria meningitidis NOT DETECTED NOT DETECTED Final   Pseudomonas aeruginosa NOT DETECTED NOT DETECTED Final   Candida albicans NOT DETECTED NOT DETECTED Final   Candida glabrata NOT DETECTED NOT DETECTED Final   Candida krusei NOT DETECTED NOT DETECTED Final   Candida parapsilosis NOT DETECTED NOT DETECTED Final   Candida tropicalis NOT DETECTED NOT DETECTED Final  Blood Culture (routine x 2)     Status: None   Collection Time: 07/17/17 11:52 AM  Result Value Ref Range Status   Specimen Description   Final    BLOOD LEFT HAND Performed at [facility], [address], [place], [place] [address]    Special Requests    Final    BOTTLES DRAWN AEROBIC AND ANAEROBIC Blood Culture adequate volume Performed at [facility], [address], [place], [place] [address]    Culture   Final    NO GROWTH 5 DAYS Performed at [facility], [address], [place], [place] [address]    Report Status 07/22/2017 FINAL  Final  Urine culture     Status: Abnormal   Collection Time: 07/17/17  3:31 PM  Result Value Ref Range Status   Specimen Description   Final    URINE, CLEAN CATCH Performed at [facility], [address], [place], [place] [address]    Special Requests   Final    NONE Performed at [facility], [address], [place], [place] [address]    Culture >=100,000 COLONIES/mL ESCHERICHIA COLI (A)  Final   Report Status 07/19/2017 FINAL  Final   Organism ID, Bacteria ESCHERICHIA COLI (A)  Final      Susceptibility   Escherichia coli - MIC*    AMPICILLIN 4 SENSITIVE Sensitive     CEFAZOLIN <=4 SENSITIVE Sensitive     CEFTRIAXONE <=1 SENSITIVE Sensitive     CIPROFLOXACIN <=0.25 SENSITIVE Sensitive     GENTAMICIN <=1 SENSITIVE Sensitive     IMIPENEM <=0.25 SENSITIVE Sensitive     NITROFURANTOIN <=16 SENSITIVE Sensitive     TRIMETH/SULFA <=20 SENSITIVE Sensitive     AMPICILLIN/SULBACTAM <=2 SENSITIVE Sensitive     PIP/TAZO <=4 SENSITIVE Sensitive     Extended ESBL NEGATIVE Sensitive     * >=100,000 COLONIES/mL ESCHERICHIA COLI  MRSA PCR Screening     Status: None   Collection Time: 07/18/17  1:47 AM  Result Value Ref Range Status   MRSA by PCR NEGATIVE NEGATIVE Final    Comment:        The GeneXpert MRSA Assay (FDA approved for NASAL specimens only), is one component of a comprehensive MRSA colonization surveillance program. It is not intended to diagnose MRSA infection nor to guide or monitor treatment for MRSA infections. Performed at [facility], [address] [place], [place] [address]      Time  coordinating discharge: 35  minutes  SIGNED:   Kathlen Mody, MD  [facility] 07/23/2017, 12:45 PM Pager   If 7PM-7AM, please contact  night-coverage [email or web address] Password [email or web address]

## 2017-10-16 ENCOUNTER — Ambulatory Visit: Payer: Medicare Other | Admitting: Nurse Practitioner

## 2017-10-19 ENCOUNTER — Observation Stay (HOSPITAL_COMMUNITY)
Admission: EM | Admit: 2017-10-19 | Discharge: 2017-10-22 | Disposition: A | Discharge status: Skilled Nursing Facility | Payer: Medicare Other | Attending: Family Medicine | Admitting: Family Medicine

## 2017-10-19 ENCOUNTER — Emergency Department (HOSPITAL_COMMUNITY): Payer: Medicare Other

## 2017-10-19 ENCOUNTER — Encounter (HOSPITAL_COMMUNITY): Payer: Self-pay | Admitting: *Deleted

## 2017-10-19 ENCOUNTER — Other Ambulatory Visit: Payer: Self-pay

## 2017-10-19 DIAGNOSIS — G7000 Myasthenia gravis without (acute) exacerbation: Secondary | ICD-10-CM | POA: Insufficient documentation

## 2017-10-19 DIAGNOSIS — I10 Essential (primary) hypertension: Secondary | ICD-10-CM | POA: Diagnosis present

## 2017-10-19 DIAGNOSIS — S8252XA Displaced fracture of medial malleolus of left tibia, initial encounter for closed fracture: Secondary | ICD-10-CM

## 2017-10-19 DIAGNOSIS — Z8249 Family history of ischemic heart disease and other diseases of the circulatory system: Secondary | ICD-10-CM | POA: Insufficient documentation

## 2017-10-19 DIAGNOSIS — S93422A Sprain of deltoid ligament of left ankle, initial encounter: Secondary | ICD-10-CM | POA: Diagnosis not present

## 2017-10-19 DIAGNOSIS — N189 Chronic kidney disease, unspecified: Secondary | ICD-10-CM | POA: Insufficient documentation

## 2017-10-19 DIAGNOSIS — M2012 Hallux valgus (acquired), left foot: Secondary | ICD-10-CM | POA: Insufficient documentation

## 2017-10-19 DIAGNOSIS — Z7951 Long term (current) use of inhaled steroids: Secondary | ICD-10-CM | POA: Diagnosis not present

## 2017-10-19 DIAGNOSIS — S82891A Other fracture of right lower leg, initial encounter for closed fracture: Secondary | ICD-10-CM

## 2017-10-19 DIAGNOSIS — K5909 Other constipation: Secondary | ICD-10-CM | POA: Diagnosis not present

## 2017-10-19 DIAGNOSIS — J449 Chronic obstructive pulmonary disease, unspecified: Secondary | ICD-10-CM | POA: Diagnosis not present

## 2017-10-19 DIAGNOSIS — S82892A Other fracture of left lower leg, initial encounter for closed fracture: Secondary | ICD-10-CM

## 2017-10-19 DIAGNOSIS — Z751 Person awaiting admission to adequate facility elsewhere: Secondary | ICD-10-CM | POA: Diagnosis not present

## 2017-10-19 DIAGNOSIS — M8580 Other specified disorders of bone density and structure, unspecified site: Secondary | ICD-10-CM | POA: Insufficient documentation

## 2017-10-19 DIAGNOSIS — E0590 Thyrotoxicosis, unspecified without thyrotoxic crisis or storm: Secondary | ICD-10-CM | POA: Insufficient documentation

## 2017-10-19 DIAGNOSIS — Z7982 Long term (current) use of aspirin: Secondary | ICD-10-CM | POA: Insufficient documentation

## 2017-10-19 DIAGNOSIS — I480 Paroxysmal atrial fibrillation: Secondary | ICD-10-CM | POA: Diagnosis not present

## 2017-10-19 DIAGNOSIS — Z79899 Other long term (current) drug therapy: Secondary | ICD-10-CM | POA: Diagnosis not present

## 2017-10-19 DIAGNOSIS — K219 Gastro-esophageal reflux disease without esophagitis: Secondary | ICD-10-CM | POA: Diagnosis not present

## 2017-10-19 DIAGNOSIS — I129 Hypertensive chronic kidney disease with stage 1 through stage 4 chronic kidney disease, or unspecified chronic kidney disease: Secondary | ICD-10-CM | POA: Insufficient documentation

## 2017-10-19 DIAGNOSIS — Z66 Do not resuscitate: Secondary | ICD-10-CM | POA: Insufficient documentation

## 2017-10-19 DIAGNOSIS — S82409A Unspecified fracture of shaft of unspecified fibula, initial encounter for closed fracture: Secondary | ICD-10-CM | POA: Diagnosis present

## 2017-10-19 DIAGNOSIS — Z96642 Presence of left artificial hip joint: Secondary | ICD-10-CM | POA: Insufficient documentation

## 2017-10-19 DIAGNOSIS — W010XXA Fall on same level from slipping, tripping and stumbling without subsequent striking against object, initial encounter: Secondary | ICD-10-CM | POA: Insufficient documentation

## 2017-10-19 DIAGNOSIS — S82831A Other fracture of upper and lower end of right fibula, initial encounter for closed fracture: Secondary | ICD-10-CM | POA: Diagnosis not present

## 2017-10-19 DIAGNOSIS — Z96652 Presence of left artificial knee joint: Secondary | ICD-10-CM | POA: Insufficient documentation

## 2017-10-19 DIAGNOSIS — M1611 Unilateral primary osteoarthritis, right hip: Secondary | ICD-10-CM | POA: Diagnosis not present

## 2017-10-19 DIAGNOSIS — M2011 Hallux valgus (acquired), right foot: Secondary | ICD-10-CM | POA: Diagnosis not present

## 2017-10-19 DIAGNOSIS — S8264XA Nondisplaced fracture of lateral malleolus of right fibula, initial encounter for closed fracture: Principal | ICD-10-CM | POA: Insufficient documentation

## 2017-10-19 DIAGNOSIS — S93402A Sprain of unspecified ligament of left ankle, initial encounter: Secondary | ICD-10-CM | POA: Diagnosis present

## 2017-10-19 LAB — COMPREHENSIVE METABOLIC PANEL
ALT: 17 U/L (ref 0–44)
ANION GAP: 10 (ref 5–15)
AST: 18 U/L (ref 15–41)
Albumin: 4 g/dL (ref 3.5–5.0)
Alkaline Phosphatase: 59 U/L (ref 38–126)
BUN: 39 mg/dL — ABNORMAL HIGH (ref 8–23)
CHLORIDE: 104 mmol/L (ref 98–111)
CO2: 23 mmol/L (ref 22–32)
Calcium: 9.5 mg/dL (ref 8.9–10.3)
Creatinine, Ser: 0.73 mg/dL (ref 0.44–1.00)
Glucose, Bld: 131 mg/dL — ABNORMAL HIGH (ref 70–99)
POTASSIUM: 4.6 mmol/L (ref 3.5–5.1)
SODIUM: 137 mmol/L (ref 135–145)
Total Bilirubin: 0.5 mg/dL (ref 0.3–1.2)
Total Protein: 7.7 g/dL (ref 6.5–8.1)

## 2017-10-19 LAB — CBC
HCT: 39.3 % (ref 36.0–46.0)
Hemoglobin: 12.4 g/dL (ref 12.0–15.0)
MCH: 27.2 pg (ref 26.0–34.0)
MCHC: 31.6 g/dL (ref 30.0–36.0)
MCV: 86.2 fL (ref 78.0–100.0)
PLATELETS: 199 10*3/uL (ref 150–400)
RBC: 4.56 MIL/uL (ref 3.87–5.11)
RDW: 15.4 % (ref 11.5–15.5)
WBC: 10.3 10*3/uL (ref 4.0–10.5)

## 2017-10-19 MED ORDER — ESOMEPRAZOLE MAGNESIUM 40 MG PO PACK: 40.0000 mg | PACK | Freq: Every day | ORAL | Status: DC

## 2017-10-19 MED ORDER — ENOXAPARIN SODIUM 40 MG/0.4ML ~~LOC~~ SOLN
40.0000 mg | SUBCUTANEOUS | Status: DC
Start: 2017-10-19 — End: 2017-10-22
  Administered 2017-10-19 – 2017-10-21 (×3): 40 mg via SUBCUTANEOUS
  Filled 2017-10-19 (×3): qty 0.4

## 2017-10-19 MED ORDER — MYCOPHENOLATE MOFETIL 500 MG PO TABS: 500.0000 mg | ORAL_TABLET | Freq: Two times a day (BID) | ORAL | Status: DC

## 2017-10-19 MED ORDER — SPIRONOLACTONE 25 MG PO TABS
25.0000 mg | ORAL_TABLET | Freq: Every day | ORAL | Status: DC
  Administered 2017-10-19 – 2017-10-22 (×4): 25 mg via ORAL
  Filled 2017-10-19 (×4): qty 1

## 2017-10-19 MED ORDER — AMIODARONE HCL 200 MG PO TABS
200.0000 mg | ORAL_TABLET | Freq: Every day | ORAL | Status: DC
  Administered 2017-10-19 – 2017-10-22 (×4): 200 mg via ORAL
  Filled 2017-10-19 (×4): qty 1

## 2017-10-19 MED ORDER — ONDANSETRON HCL 4 MG PO TABS
4.0000 mg | ORAL_TABLET | Freq: Four times a day (QID) | ORAL | Status: DC | PRN
  Administered 2017-10-20 – 2017-10-22 (×2): 4 mg via ORAL
  Filled 2017-10-19 (×2): qty 1

## 2017-10-19 MED ORDER — LISINOPRIL 20 MG PO TABS
40.0000 mg | ORAL_TABLET | Freq: Every day | ORAL | Status: DC
  Administered 2017-10-19 – 2017-10-20 (×2): 40 mg via ORAL
  Filled 2017-10-19 (×4): qty 2

## 2017-10-19 MED ORDER — ASPIRIN EC 81 MG PO TBEC
81.0000 mg | DELAYED_RELEASE_TABLET | Freq: Every day | ORAL | Status: DC
  Administered 2017-10-19 – 2017-10-22 (×4): 81 mg via ORAL
  Filled 2017-10-19 (×4): qty 1

## 2017-10-19 MED ORDER — HYDROCODONE-ACETAMINOPHEN 5-325 MG PO TABS
1.0000 | ORAL_TABLET | Freq: Once | ORAL | Status: AC
  Administered 2017-10-19: 1 via ORAL
  Filled 2017-10-19: qty 1

## 2017-10-19 MED ORDER — SUCRALFATE 1 GM/10ML PO SUSP
1.0000 g | Freq: Three times a day (TID) | ORAL | Status: DC
  Administered 2017-10-19 – 2017-10-22 (×12): 1 g via ORAL
  Filled 2017-10-19 (×12): qty 10

## 2017-10-19 MED ORDER — AMITRIPTYLINE HCL 50 MG PO TABS
50.0000 mg | ORAL_TABLET | Freq: Every day | ORAL | Status: DC
  Administered 2017-10-19 – 2017-10-21 (×3): 50 mg via ORAL
  Filled 2017-10-19 (×2): qty 1
  Filled 2017-10-19: qty 2
  Filled 2017-10-19: qty 1

## 2017-10-19 MED ORDER — HYDROCODONE-ACETAMINOPHEN 5-325 MG PO TABS
2.0000 | ORAL_TABLET | Freq: Four times a day (QID) | ORAL | Status: DC | PRN
  Administered 2017-10-19 – 2017-10-20 (×2): 2 via ORAL
  Filled 2017-10-19 (×2): qty 2

## 2017-10-19 MED ORDER — DILTIAZEM HCL ER COATED BEADS 120 MG PO CP24
300.0000 mg | ORAL_CAPSULE | Freq: Every day | ORAL | Status: DC
  Administered 2017-10-19 – 2017-10-22 (×4): 300 mg via ORAL
  Filled 2017-10-19 (×4): qty 1

## 2017-10-19 MED ORDER — ONDANSETRON HCL 4 MG/2ML IJ SOLN: 4.0000 mg | Freq: Four times a day (QID) | INTRAMUSCULAR | Status: DC | PRN

## 2017-10-19 MED ORDER — CYCLOBENZAPRINE HCL 10 MG PO TABS: 5.0000 mg | ORAL_TABLET | Freq: Three times a day (TID) | ORAL | Status: DC | PRN

## 2017-10-19 MED ORDER — MYCOPHENOLATE MOFETIL 250 MG PO CAPS
500.0000 mg | ORAL_CAPSULE | Freq: Two times a day (BID) | ORAL | Status: DC
  Administered 2017-10-19 – 2017-10-22 (×6): 500 mg via ORAL
  Filled 2017-10-19 (×7): qty 2

## 2017-10-19 MED ORDER — DILTIAZEM HCL ER COATED BEADS 300 MG PO TB24: 300.0000 mg | ORAL_TABLET | Freq: Every day | ORAL | Status: DC

## 2017-10-19 MED ORDER — OXYCODONE HCL 5 MG PO TABS
5.0000 mg | ORAL_TABLET | Freq: Once | ORAL | Status: DC | PRN
  Administered 2017-10-19: 5 mg via ORAL
  Filled 2017-10-19: qty 1

## 2017-10-19 MED ORDER — UMECLIDINIUM BROMIDE 62.5 MCG/INH IN AEPB
1.0000 | INHALATION_SPRAY | Freq: Every day | RESPIRATORY_TRACT | Status: DC
  Administered 2017-10-20 – 2017-10-21 (×2): 1 via RESPIRATORY_TRACT
  Filled 2017-10-19: qty 7

## 2017-10-19 MED ORDER — PANTOPRAZOLE SODIUM 40 MG PO TBEC
80.0000 mg | DELAYED_RELEASE_TABLET | Freq: Every day | ORAL | Status: DC
  Administered 2017-10-20 – 2017-10-22 (×3): 80 mg via ORAL
  Filled 2017-10-19 (×3): qty 2

## 2017-10-19 MED ORDER — METHIMAZOLE 5 MG PO TABS
5.0000 mg | ORAL_TABLET | Freq: Every day | ORAL | Status: DC
  Administered 2017-10-19 – 2017-10-22 (×4): 5 mg via ORAL
  Filled 2017-10-19 (×4): qty 1

## 2017-10-19 MED ORDER — VORTIOXETINE HBR 5 MG PO TABS
10.0000 mg | ORAL_TABLET | Freq: Every day | ORAL | Status: DC
  Administered 2017-10-19 – 2017-10-22 (×4): 10 mg via ORAL
  Filled 2017-10-19 (×4): qty 2

## 2017-10-19 MED ORDER — FLUTICASONE FUROATE-VILANTEROL 100-25 MCG/INH IN AEPB
1.0000 | INHALATION_SPRAY | Freq: Every day | RESPIRATORY_TRACT | Status: DC
  Administered 2017-10-20 – 2017-10-21 (×2): 1 via RESPIRATORY_TRACT
  Filled 2017-10-19: qty 28

## 2017-10-19 MED ORDER — BISACODYL 10 MG RE SUPP
10.0000 mg | Freq: Every day | RECTAL | Status: DC
  Administered 2017-10-20 – 2017-10-21 (×2): 10 mg via RECTAL
  Filled 2017-10-19 (×5): qty 1

## 2017-10-19 MED ORDER — HYDROCODONE-ACETAMINOPHEN 5-325 MG PO TABS
1.0000 | ORAL_TABLET | ORAL | Status: DC | PRN
  Administered 2017-10-19 (×2): 1 via ORAL
  Filled 2017-10-19 (×2): qty 1

## 2017-10-19 NOTE — ED Notes (Signed)
Bed: WA17 Expected date:  Expected time:  Means of arrival:  Comments: 84 yo fall, ankle pain

## 2017-10-19 NOTE — H&P (Signed)
History and Physical    Paula Bird  ZOX:[id number]  DOB: 1925/04/13  DOA: 10/19/2017 PCP: Angela Cox, MD   Patient coming from: ALF  Chief Complaint: fall  HPI: Paula Bird is a 84 y.o. female with medical history of Myasthenia Gravis, PAF not on anticoagulation, COPD, hyperthyroidism presents for a mechanical fall last night while at the assisted living and difficulty with walking. She is being admitted as she cannot walk and is in pain.  She give a 7 month long history of having multiple different issues from respiratory infections to E coli bacteremia (for which she was admitted) and then has been having constipation which is then being treated with laxative and then having many days of diarrhea. She is currently constipated and abdomen is bloated.   ED Course:   Left ankle xray: Avulsion fracture of the medial malleolus with associated soft tissue swelling. Right ankle xray: Distal fibular fracture with associated soft tissue changes. Left foot xray: Transverse nondisplaced fracture of the left fifth metatarsal head. Avulsion fracture of the left medial malleolus. Osteopenia.  Review of Systems:  All other systems reviewed and apart from HPI, are negative.  Past Medical History:  Diagnosis Date  . Acute and chronic respiratory failure, unspecified whether with hypoxia or hypercapnia (HCC)   . Afib (HCC)   . Allergic rhinitis   . Anemia   . CKD (chronic kidney disease)   . Constipation   . COPD (chronic obstructive pulmonary disease) (HCC)   . Depression   . Diplopia   . Dysphagia   . Edema, lower extremity   . Esophageal dysmotility   . Gait instability   . GERD (gastroesophageal reflux disease)   . Hypertension   . Hypothyroidism   . IBS (irritable bowel syndrome)   . IDA (iron deficiency anemia)   . Insomnia   . Leukocytosis   . Myasthenia gravis (HCC)   . Oral thrush   . PAF (paroxysmal atrial fibrillation) (HCC)   . Physical deconditioning    . Pneumonia 02/2017  . Protein calorie malnutrition (HCC)   . Thyroid disease   . Thyroid mass   . Vertigo     Past Surgical History:  Procedure Laterality Date  . ABDOMINAL HYSTERECTOMY    . APPENDECTOMY    . ESOPHAGOGASTRODUODENOSCOPY (EGD) WITH PROPOFOL N/A 07/21/2017   Procedure: ESOPHAGOGASTRODUODENOSCOPY (EGD) WITH PROPOFOL;  Surgeon: Kerin Salen, MD;  Location: [facility];  Service: Gastroenterology;  Laterality: N/A;  . TOTAL HIP ARTHROPLASTY Left   . TOTAL KNEE ARTHROPLASTY Left     Social History:   reports that she has never smoked. She has never used smokeless tobacco. She reports that she does not drink alcohol or use drugs.  No Known Allergies  Family History  Problem Relation Age of Onset  . Hypertension Mother   . Hypertension Son   . Diabetes Neg Hx   . Thyroid disease Neg Hx      Prior to Admission medications   Medication Sig Start Date End Date Taking? Authorizing Provider  amiodarone (PACERONE) 200 MG tablet Take 200 mg by mouth daily. 06/07/17  Yes [provider]  amitriptyline (ELAVIL) 50 MG tablet Take 50 mg by mouth at bedtime. 06/22/17  Yes [provider]  aspirin EC 81 MG tablet Take 81 mg by mouth daily.   Yes [provider]  bisacodyl (DULCOLAX) 10 MG suppository Place 10 mg rectally daily as needed for moderate constipation.   Yes [provider]  Earlie Server  100-25 MCG/INH AEPB Take 1 puff by mouth daily. 06/10/17  Yes [provider]  calcium-vitamin D (OSCAL WITH D) 500-200 MG-UNIT tablet Take 1 tablet by mouth 2 (two) times daily.   Yes [provider]  Cranberry 425 MG CAPS Take 425 mg by mouth 2 (two) times daily.   Yes [provider]  cyclobenzaprine (FLEXERIL) 5 MG tablet Take 1 tablet (5 mg total) by mouth 3 (three) times daily as needed for muscle spasms. 07/23/17  Yes Kathlen Mody, MD  diltiazem (CARDIZEM LA) 300 MG 24 hr tablet Take 300 mg by mouth daily. 03/04/16   Yes [provider]  esomeprazole (NEXIUM) 40 MG packet Take 40 mg by mouth daily before breakfast.   Yes [provider]  HYDROcodone-acetaminophen (NORCO/VICODIN) 5-325 MG tablet Take 1 tablet by mouth every 6 (six) hours as needed for moderate pain. For pain 03/13/17  Yes Zannie Cove, MD  INCRUSE ELLIPTA 62.5 MCG/INH AEPB Take 1 puff by mouth daily. 06/10/17  Yes [provider]  lisinopril (PRINIVIL,ZESTRIL) 40 MG tablet Take 40 mg by mouth daily. 03/04/16  Yes [provider]  loperamide (IMODIUM A-D) 2 MG tablet Take 2 mg by mouth every 6 (six) hours as needed for diarrhea or loose stools.   Yes [provider]  methimazole (TAPAZOLE) 5 MG tablet Take 5 mg by mouth daily.   Yes [provider]  mycophenolate (CELLCEPT) 500 MG tablet Take 1 tablet (500 mg total) by mouth 2 (two) times daily. 10/16/16  Yes Levert Feinstein, MD  ondansetron (ZOFRAN) 4 MG tablet Take 1 tablet (4 mg total) by mouth every 8 (eight) hours as needed for nausea or vomiting. 07/04/17  Yes Couture, Cortni S, PA-C  OVER THE COUNTER MEDICATION Take 1 tablet by mouth daily. Vitafusion MVI adult gummies   Yes [provider]  polyethylene glycol (MIRALAX / GLYCOLAX) packet Take 17 g by mouth daily. Patient taking differently: Take 17 g by mouth 2 (two) times daily.  08/27/14  Yes Harris, Abigail, PA-C  promethazine (PHENERGAN) 25 MG tablet Take 25 mg by mouth every 6 (six) hours as needed. for GERD 07/03/17  Yes [provider]  sennosides-docusate sodium (SENOKOT-S) 8.6-50 MG tablet Take 1 tablet by mouth 2 (two) times daily. 08/16/15  Yes [name], MD  spironolactone (ALDACTONE) 25 MG tablet Take 25 mg by mouth daily.   Yes [provider]  sucralfate (CARAFATE) 1 GM/10ML suspension Take 10 mLs (1 g total) by mouth 4 (four) times daily -  with meals and at bedtime. 07/23/17  Yes Kathlen Mody, MD  SUMAtriptan (IMITREX) 25 MG tablet Take 50 mg by mouth  daily as needed for migraine. May repeat in 2 hours if headache persists or recurs.   Yes [provider]  TRINTELLIX 10 MG TABS tablet Take 10 mg by mouth daily. 06/07/17  Yes [provider]  zolpidem (AMBIEN) 5 MG tablet 5 mg at bedtime. 03/29/17  Yes [provider]  pantoprazole (PROTONIX) 40 MG tablet Take 1 tablet (40 mg total) by mouth 2 (two) times daily. Patient not taking: Reported on 10/19/2017 07/20/17   Kathlen Mody, MD    Physical Exam: Wt Readings from Last 3 Encounters:  10/19/17 63.5 kg (140 lb)  07/17/17 63.5 kg (140 lb)  07/04/17 63.5 kg (140 lb)   Vitals:   10/19/17 0918 10/19/17 0919 10/19/17 1302  BP: (!) 116/47  (!) 134/103  Pulse: 66  80  Resp: 17  15  SpO2: 96%  99%  Weight:  63.5 kg (140 lb)       Constitutional:  Calm & comfortable Eyes: PERRLA, lids and conjunctivae normal ENMT:  Mucous membranes are moist.  Pharynx clear of exudate   Normal dentition.  Neck: Supple, no masses  Respiratory:  Clear to auscultation bilaterally  Normal respiratory effort.  Cardiovascular:  S1 & S2 heard, regular rate and rhythm No Murmurs Abdomen:  + moderately distended with mild diffuse tenderness No masses Bowel sounds normal Extremities:  No clubbing / cyanosis No pedal edema No joint deformity    Skin:  No rashes, lesions or ulcers Neurologic:  AAO x 3 CN 2-12 grossly intact Sensation intact Strength 5/5 in all 4 extremities Psychiatric:  Normal Mood and affect    Labs on Admission: I have personally reviewed following labs and imaging studies  CBC: Recent Labs  Lab 10/19/17 1126  WBC 10.3  HGB 12.4  HCT 39.3  MCV 86.2  PLT 199   Basic Metabolic Panel: Recent Labs  Lab 10/19/17 1126  NA 137  K 4.6  CL 104  CO2 23  GLUCOSE 131*  BUN 39*  CREATININE 0.73  CALCIUM 9.5   GFR: Estimated Creatinine Clearance: 38.9 mL/min (by C-G formula based on SCr of 0.73 mg/dL). Liver Function Tests: Recent Labs    Lab 10/19/17 1126  AST 18  ALT 17  ALKPHOS 59  BILITOT 0.5  PROT 7.7  ALBUMIN 4.0   No results for input(s): LIPASE, AMYLASE in the last 168 hours. No results for input(s): AMMONIA in the last 168 hours. Coagulation Profile: No results for input(s): INR, PROTIME in the last 168 hours. Cardiac Enzymes: No results for input(s): CKTOTAL, CKMB, CKMBINDEX, TROPONINI in the last 168 hours. BNP (last 3 results) No results for input(s): PROBNP in the last 8760 hours. HbA1C: No results for input(s): HGBA1C in the last 72 hours. CBG: No results for input(s): GLUCAP in the last 168 hours. Lipid Profile: No results for input(s): CHOL, HDL, LDLCALC, TRIG, CHOLHDL, LDLDIRECT in the last 72 hours. Thyroid Function Tests: No results for input(s): TSH, T4TOTAL, FREET4, T3FREE, THYROIDAB in the last 72 hours. Anemia Panel: No results for input(s): VITAMINB12, FOLATE, FERRITIN, TIBC, IRON, RETICCTPCT in the last 72 hours. Urine analysis:    Component Value Date/Time   COLORURINE YELLOW 07/17/2017 1531   APPEARANCEUR HAZY (A) 07/17/2017 1531   LABSPEC 1.010 07/17/2017 1531   PHURINE 7.0 07/17/2017 1531   GLUCOSEU NEGATIVE 07/17/2017 1531   HGBUR SMALL (A) 07/17/2017 1531   BILIRUBINUR NEGATIVE 07/17/2017 1531   KETONESUR NEGATIVE 07/17/2017 1531   PROTEINUR NEGATIVE 07/17/2017 1531   UROBILINOGEN 0.2 08/13/2014 2256   NITRITE NEGATIVE 07/17/2017 1531   LEUKOCYTESUR MODERATE (A) 07/17/2017 1531   Sepsis Labs: @LABRCNTIP (procalcitonin:4,lacticidven:4) )No results found for this or any previous visit (from the past 240 hour(s)).   Radiological Exams on Admission: Dg Pelvis 1-2 Views  Result Date: 10/19/2017 CLINICAL DATA:  Fall yesterday with hip pain, initial encounter EXAM: PELVIS - 1-2 VIEW COMPARISON:  None. FINDINGS: Pelvic ring is intact. There are changes consistent with prior superior and inferior pubic ramus fractures on the right. Postsurgical changes in the left hip are noted.  Significant degenerative change in remodeling in the right hip joint are seen. IMPRESSION: Chronic appearing changes as described. The right hip is incompletely evaluated on this exam. If there is clinical suspicion for fracture dedicated films are recommended. Electronically Signed   By: Eulah Pont.D.  On: 10/19/2017 10:40   Dg Ankle Complete Left  Result Date: 10/19/2017 CLINICAL DATA:  Bilateral ankle and foot pain. EXAM: LEFT ANKLE COMPLETE - 3+ VIEW COMPARISON:  None. FINDINGS: Osteopenia. Avulsion fracture of the medial malleolus with associated soft tissue swelling . Ankle mortise articulations are preserved. Vascular calcifications noted within the soft tissues. IMPRESSION: Avulsion fracture of the medial malleolus with associated soft tissue swelling. Electronically Signed   By: Ted Mcalpine M.D.   On: 10/19/2017 09:51   Dg Ankle Complete Right  Result Date: 10/19/2017 CLINICAL DATA:  Recent fall with ankle pain and swelling, initial encounter EXAM: RIGHT ANKLE - COMPLETE 3+ VIEW COMPARISON:  None. FINDINGS: Oblique fracture through the distal fibular metaphysis is identified. Associated soft tissue swelling is noted. No other fracture or dislocation is seen. Vascular calcifications are noted. IMPRESSION: Distal fibular fracture with associated soft tissue changes. Electronically Signed   By: Alcide Clever M.D.   On: 10/19/2017 09:51   Dg Foot Complete Left  Result Date: 10/19/2017 CLINICAL DATA:  Bilateral feet and ankles pain post fall. EXAM: LEFT FOOT - COMPLETE 3+ VIEW COMPARISON:  None. FINDINGS: Osteopenia. There is a transverse nondisplaced fracture of the fifth metatarsal head. Avulsion fracture of the medial malleolus is also noted. Chronic hallux valgus deformity of the first ray. Mild soft tissue swelling. IMPRESSION: Transverse nondisplaced fracture of the left fifth metatarsal head. Avulsion fracture of the left medial malleolus. Osteopenia. Electronically Signed   By:  Ted Mcalpine M.D.   On: 10/19/2017 09:54   Dg Foot Complete Right  Result Date: 10/19/2017 CLINICAL DATA:  Fall yesterday with right ankle pain and swelling, initial encounter EXAM: RIGHT FOOT COMPLETE - 3+ VIEW COMPARISON:  None. FINDINGS: Distal fibular fracture is again identified. Healed fractures of the second and third metatarsals are seen. Hallux valgus deformity is noted. No other fracture is noted. Degenerative changes of the tarsal bones are noted. IMPRESSION: Fibular fracture is again noted. No acute abnormality in the foot is seen. Electronically Signed   By: Alcide Clever M.D.   On: 10/19/2017 09:56   Dg Hip Unilat With Pelvis 2-3 Views Right  Result Date: 10/19/2017 CLINICAL DATA:  Acute RIGHT hip pain following fall yesterday. Initial encounter. EXAM: DG HIP (WITH OR WITHOUT PELVIS) 2-3V RIGHT COMPARISON:  11/13/2015 radiographs FINDINGS: No acute fracture or dislocation. Moderate joint space narrowing and osteophytosis noted. Remote fractures of the RIGHT SUPERIOR and INFERIOR pubic rami again identified. IMPRESSION: 1. No acute bony abnormality 2. Moderate RIGHT hip degenerative changes. Electronically Signed   By: Harmon Pier M.D.   On: 10/19/2017 11:52      Assessment/Plan Active Problems:    Right Fibula fracture   Avulsion fracture of medial malleolus of left tibia - splints have been applied- Dr Ophelia Charter will see her tomorrow and make recommendations on PT- bedrest until then - DVT prophylaxis with Lovenox     Hypertension - Lisinopril, Cardizem, Aldactone  Constipation - issues with Constipation and diarrhea are going to be further worked up but outpt GI but she will likely not be able to make the appointment - will give a Dulcolax suppository and place on Miralax BID while she is on pain medications for her fractures - cont Nexium, Carafate  Myasthenia Gravis - on Cellcept for this    Paroxysmal atrial fibrillation  - not on anticoagulation - currently, on  exam, she is in NSR    Hyperthyroidism - Tapazol  COPD - cont home inhalers- no current  wheezing  DVT prophylaxis: Lovenox Code Status: DNR  Family Communication: son at bedside  Disposition Plan: admit to Med surg- social work consult for SNF placement  Consults called: ortho called by ED  Admission status: inpatient     Calvert CantorSaima Sabastian Raimondi MD [facility] Pager: [email or web address] Password [email or web address] 7PM-7AM, please contact night-coverage   10/19/2017, 1:59 PM

## 2017-10-19 NOTE — Progress Notes (Signed)
Assumed care of patient from ER. Pt alert and oriented; grimacing in pain, patient oriented to room  and reassured. Son at bedside.

## 2017-10-19 NOTE — ED Triage Notes (Signed)
EMS reports pt fell last night and has rt ankle swelling and pain. Upon assessment both ankles are hurting as well as bil foot pain. Swelling of rt lateral ankle.

## 2017-10-19 NOTE — ED Notes (Signed)
Pt presents after a fall last night, No LOC swelling to ankles, mild to left and large amount to rt lateral ankle, Good pulse, color and cap refills although she states he feet hurt as well after falling. X rays order for areas of complaint.

## 2017-10-19 NOTE — Care Management Note (Addendum)
Case Management Note  Patient Details  Name: Paula Bird MRN: [id number] Date of Birth: 08/19/1925  Subjective/Objective:  MG,  Fall,   avulusion fracture of medical malleolus of left tibia              Action/Plan: NCM spoke to pt and son, [name] at bedside. She lives at [facility]. She has wheelchair and RW at ALF. She is currently non weight bearing. Pt is requesting rehab. Waiting PT/OT recommendations.   Expected Discharge Date:                  Expected Discharge Plan:  Skilled Nursing Facility  In-House Referral:  Clinical Social Work  Discharge planning Services  CM Consult  Post Acute Care Choice:  NA Choice offered to:  NA  DME Arranged:  N/A DME Agency:  NA  HH Arranged:  NA HH Agency:  NA  Status of Service:  Completed, signed off  If discussed at Long Length of Stay Meetings, dates discussed:    Additional Comments:  Elliot CousinShavis, Timmie Calix Ellen, RN 10/19/2017, 2:11 PM

## 2017-10-19 NOTE — ED Notes (Addendum)
ED TO INPATIENT HANDOFF REPORT  Name/Age/Gender Paula Bird 84 y.o. female  Code Status    Code Status Orders  (From admission, onward)        Start     Ordered   10/19/17 1336  Do not attempt resuscitation (DNR)  Continuous    Question Answer Comment  In the event of cardiac or respiratory ARREST Do not call a "code blue"   In the event of cardiac or respiratory ARREST Do not perform Intubation, CPR, defibrillation or ACLS   In the event of cardiac or respiratory ARREST Use medication by any route, position, wound care, and other measures to relive pain and suffering. May use oxygen, suction and manual treatment of airway obstruction as needed for comfort.      10/19/17 1336    Code Status History    Date Active Date Inactive Code Status Order ID Comments User Context   07/18/2017 0125 07/23/2017 1745 DNR [id number]  Hosie Poisson, MD ED   03/05/2017 0109 03/13/2017 2131 DNR [id number]  Rise Patience, MD ED   10/08/2015 2314 10/10/2015 2049 DNR [id number]  Etta Quill, DO ED   08/08/2015 1250 08/16/2015 1928 DNR [id number]  Kelvin Cellar, MD Inpatient   08/04/2015 1905 08/06/2015 1342 DNR [id number]  Vianne Bulls, MD ED      Home/SNF/Other Nursing Home  Chief Complaint fall, ankle injury  Level of Care/Admitting Diagnosis ED Disposition    ED Disposition Condition [place]: [facility] [[id number]]  Level of Care: Med-Surg [16]  Diagnosis: Fibula fracture [329518]  Admitting Physician: [place], [place]  Attending Physician: Debbe Odea [[id number]]  Estimated length of stay: past midnight tomorrow  Certification:: I certify this patient will need inpatient services for at least 2 midnights  PT Class (Do Not Modify): Inpatient [101]  PT Acc Code (Do Not Modify): Private [1]       Medical History Past Medical History:  Diagnosis Date  . Acute and chronic respiratory failure, unspecified whether with hypoxia or  hypercapnia ([place])   . Afib ([place])   . Allergic rhinitis   . Anemia   . CKD (chronic kidney disease)   . Constipation   . COPD (chronic obstructive pulmonary disease) ([place])   . Depression   . Diplopia   . Dysphagia   . Edema, lower extremity   . Esophageal dysmotility   . Gait instability   . GERD (gastroesophageal reflux disease)   . Hypertension   . Hypothyroidism   . IBS (irritable bowel syndrome)   . IDA (iron deficiency anemia)   . Insomnia   . Leukocytosis   . Myasthenia gravis ([place])   . Oral thrush   . PAF (paroxysmal atrial fibrillation) ([place])   . Physical deconditioning   . Pneumonia 02/2017  . Protein calorie malnutrition ([place])   . Thyroid disease   . Thyroid mass   . Vertigo     Allergies No Known Allergies  IV Location/Drains/Wounds Patient Lines/Drains/Airways Status   Active Line/Drains/Airways    None          Labs/Imaging Results for orders placed or performed during the hospital encounter of 10/19/17 (from the past 48 hour(s))  CBC     Status: None   Collection Time: 10/19/17 11:26 AM  Result Value Ref Range   WBC 10.3 4.0 - 10.5 K/uL   RBC 4.56 3.87 - 5.11 MIL/uL   Hemoglobin 12.4 12.0 - 15.0 g/dL   HCT 39.3  36.0 - 46.0 %   MCV 86.2 78.0 - 100.0 fL   MCH 27.2 26.0 - 34.0 pg   MCHC 31.6 30.0 - 36.0 g/dL   RDW 15.4 11.5 - 15.5 %   Platelets 199 150 - 400 K/uL    Comment: Performed at [facility], [place] [address] [place], [place] [address]  Comprehensive metabolic panel     Status: Abnormal   Collection Time: 10/19/17 11:26 AM  Result Value Ref Range   Sodium 137 135 - 145 mmol/L   Potassium 4.6 3.5 - 5.1 mmol/L   Chloride 104 98 - 111 mmol/L   CO2 23 22 - 32 mmol/L   Glucose, Bld 131 (H) 70 - 99 mg/dL   BUN 39 (H) 8 - 23 mg/dL   Creatinine, Ser 0.73 0.44 - 1.00 mg/dL   Calcium 9.5 8.9 - 10.3 mg/dL   Total Protein 7.7 6.5 - 8.1 g/dL   Albumin 4.0 3.5 - 5.0 g/dL   AST 18 15 - 41 U/L   ALT 17 0 - 44 U/L   Alkaline  Phosphatase 59 38 - 126 U/L   Total Bilirubin 0.5 0.3 - 1.2 mg/dL   GFR calc non Af Amer >60 >60 mL/min   GFR calc Af Amer >60 >60 mL/min    Comment: (NOTE) The eGFR has been calculated using the CKD EPI equation. This calculation has not been validated in all clinical situations. eGFR's persistently <60 mL/min signify possible Chronic Kidney Disease.    Anion gap 10 5 - 15    Comment: Performed at [facility], [place] [address] [place], [place] [address]   Dg Pelvis 1-2 Views  Result Date: 10/19/2017 CLINICAL DATA:  Fall yesterday with hip pain, initial encounter EXAM: PELVIS - 1-2 VIEW COMPARISON:  None. FINDINGS: Pelvic ring is intact. There are changes consistent with prior superior and inferior pubic ramus fractures on the right. Postsurgical changes in the left hip are noted. Significant degenerative change in remodeling in the right hip joint are seen. IMPRESSION: Chronic appearing changes as described. The right hip is incompletely evaluated on this exam. If there is clinical suspicion for fracture dedicated films are recommended. Electronically Signed   By: Inez Catalina M.D.   On: 10/19/2017 10:40   Dg Ankle Complete Left  Result Date: 10/19/2017 CLINICAL DATA:  Bilateral ankle and foot pain. EXAM: LEFT ANKLE COMPLETE - 3+ VIEW COMPARISON:  None. FINDINGS: Osteopenia. Avulsion fracture of the medial malleolus with associated soft tissue swelling . Ankle mortise articulations are preserved. Vascular calcifications noted within the soft tissues. IMPRESSION: Avulsion fracture of the medial malleolus with associated soft tissue swelling. Electronically Signed   By: Fidela Salisbury M.D.   On: 10/19/2017 09:51   Dg Ankle Complete Right  Result Date: 10/19/2017 CLINICAL DATA:  Recent fall with ankle pain and swelling, initial encounter EXAM: RIGHT ANKLE - COMPLETE 3+ VIEW COMPARISON:  None. FINDINGS: Oblique fracture through the distal fibular metaphysis is identified.  Associated soft tissue swelling is noted. No other fracture or dislocation is seen. Vascular calcifications are noted. IMPRESSION: Distal fibular fracture with associated soft tissue changes. Electronically Signed   By: Inez Catalina M.D.   On: 10/19/2017 09:51   Dg Foot Complete Left  Result Date: 10/19/2017 CLINICAL DATA:  Bilateral feet and ankles pain post fall. EXAM: LEFT FOOT - COMPLETE 3+ VIEW COMPARISON:  None. FINDINGS: Osteopenia. There is a transverse nondisplaced fracture of the fifth metatarsal head. Avulsion fracture of the  medial malleolus is also noted. Chronic hallux valgus deformity of the first ray. Mild soft tissue swelling. IMPRESSION: Transverse nondisplaced fracture of the left fifth metatarsal head. Avulsion fracture of the left medial malleolus. Osteopenia. Electronically Signed   By: Fidela Salisbury M.D.   On: 10/19/2017 09:54   Dg Foot Complete Right  Result Date: 10/19/2017 CLINICAL DATA:  Fall yesterday with right ankle pain and swelling, initial encounter EXAM: RIGHT FOOT COMPLETE - 3+ VIEW COMPARISON:  None. FINDINGS: Distal fibular fracture is again identified. Healed fractures of the second and third metatarsals are seen. Hallux valgus deformity is noted. No other fracture is noted. Degenerative changes of the tarsal bones are noted. IMPRESSION: Fibular fracture is again noted. No acute abnormality in the foot is seen. Electronically Signed   By: Inez Catalina M.D.   On: 10/19/2017 09:56   Dg Hip Unilat With Pelvis 2-3 Views Right  Result Date: 10/19/2017 CLINICAL DATA:  Acute RIGHT hip pain following fall yesterday. Initial encounter. EXAM: DG HIP (WITH OR WITHOUT PELVIS) 2-3V RIGHT COMPARISON:  11/13/2015 radiographs FINDINGS: No acute fracture or dislocation. Moderate joint space narrowing and osteophytosis noted. Remote fractures of the RIGHT SUPERIOR and INFERIOR pubic rami again identified. IMPRESSION: 1. No acute bony abnormality 2. Moderate RIGHT hip  degenerative changes. Electronically Signed   By: Margarette Canada M.D.   On: 10/19/2017 11:52    Pending Labs Unresulted Labs (From admission, onward)   Start     Ordered   10/26/17 0500  Creatinine, serum  (enoxaparin (LOVENOX)    CrCl >/= 30 ml/min)  Weekly,   R    Comments:  while on enoxaparin therapy    10/19/17 1336   10/20/17 3662  Basic metabolic panel  Tomorrow morning,   R     10/19/17 1407   10/19/17 1335  CBC  (enoxaparin (LOVENOX)    CrCl >/= 30 ml/min)  Once,   R    Comments:  Baseline for enoxaparin therapy IF NOT ALREADY DRAWN.  Notify MD if PLT < 100 K.    10/19/17 1336   10/19/17 1335  Creatinine, serum  (enoxaparin (LOVENOX)    CrCl >/= 30 ml/min)  Once,   R    Comments:  Baseline for enoxaparin therapy IF NOT ALREADY DRAWN.    10/19/17 1336      Vitals/Pain Today's Vitals   10/19/17 0918 10/19/17 0919 10/19/17 1302 10/19/17 1408  BP: (!) 116/47  (!) 134/103 136/63  Pulse: 66  80 78  Resp: _0 SpO2: 96%  99% 98%  Weight:  140 lb (63.5 kg)    PainSc: 8    6     Isolation Precautions No active isolations  Medications Medications  HYDROcodone-acetaminophen (NORCO/VICODIN) 5-325 MG per tablet 1 tablet (has no administration in time range)  HYDROcodone-acetaminophen (NORCO/VICODIN) 5-325 MG per tablet 2 tablet (has no administration in time range)  enoxaparin (LOVENOX) injection 40 mg (has no administration in time range)  ondansetron (ZOFRAN) tablet 4 mg (has no administration in time range)    Or  ondansetron (ZOFRAN) injection 4 mg (has no administration in time range)  amiodarone (PACERONE) tablet 200 mg (has no administration in time range)  amitriptyline (ELAVIL) tablet 50 mg (has no administration in time range)  aspirin EC tablet 81 mg (has no administration in time range)  fluticasone furoate-vilanterol (BREO ELLIPTA) 100-25 MCG/INH 1 puff (has no administration in time range)  cyclobenzaprine (FLEXERIL) tablet 5 mg (has no administration in  time range)  diltiazem (CARDIZEM LA) 24 hr tablet 300 mg (has no administration in time range)  esomeprazole (NEXIUM) suspension 40 mg (has no administration in time range)  umeclidinium bromide (INCRUSE ELLIPTA) 62.5 MCG/INH 1 puff (has no administration in time range)  lisinopril (PRINIVIL,ZESTRIL) tablet 40 mg (has no administration in time range)  methimazole (TAPAZOLE) tablet 5 mg (has no administration in time range)  mycophenolate (CELLCEPT) tablet 500 mg (has no administration in time range)  spironolactone (ALDACTONE) tablet 25 mg (has no administration in time range)  sucralfate (CARAFATE) 1 GM/10ML suspension 1 g (has no administration in time range)  vortioxetine HBr (TRINTELLIX) tablet 10 mg (has no administration in time range)  bisacodyl (DULCOLAX) suppository 10 mg (has no administration in time range)  HYDROcodone-acetaminophen (NORCO/VICODIN) 5-325 MG per tablet 1 tablet (1 tablet Oral Given 10/19/17 1052)    Mobility

## 2017-10-19 NOTE — Progress Notes (Signed)
CSW and Case Management met with patient and son "Luvenia Heller," at bedside regarding discharge planning. CSW was aware of patient's condition not requiring an inpatient hospitalization.   Patient presents to [facility] from [facility]F following a fall and is unable to put weight on either foot. Patient and son state is it not safe for patient to return to ALF as patient would require assistance with bathing, feeding, and using the restroom.   Consult for PT/OT is in place, will follow up.   Stephanie Acre, [place] Social Worker [phone]

## 2017-10-19 NOTE — Progress Notes (Signed)
Patient ID: Paula Bird, female   DOB: 14-Nov-1925, 84 y.o.   MRN: [id number] 84 yo with multiple medical problems Hx of falls, has left ankle medial deltoid ligament tiny avulsion which ASO will provide support. Nondisplaced right lateral malleolar fx does not require surgery and can be splinted and then put in a cast next week. Will see in AM for consult my cell [phone].   Ortho tech to apply right LE stirrup splint and left ASO [place].  Order placed for foot elevation. Will see in AM and place appropriate PT/OT orders.

## 2017-10-19 NOTE — ED Provider Notes (Signed)
[facility] Provider Note MRN:  [id number]  Arrival date & time: 10/19/17     Chief Complaint   Fall and Ankle Pain   History of Present Illness   Paula Bird is a 84 y.o. year-old female with a history of CKD, COPD, myasthenia gravis presenting to the ED with chief complaint of ankle pain.  Patient was sitting outside on the porch enjoying a nice day, and then she stood up and noticed that her left foot was asleep from sitting too long.  She took 3 steps and then tripped and fell.  This occurred yesterday at her assisted living facility.  Had a great difficulty sleeping throughout the night due to pain in her bilateral ankles.  Also endorsing right hip pain.  Denies head trauma, no loss of consciousness.  The pain is moderate to severe, constant, worse with motion or palpation.  Review of Systems  A complete 10 system review of systems was obtained and all systems are negative except as noted in the HPI and PMH.   Patient's Health History    Past Medical History:  Diagnosis Date  . Acute and chronic respiratory failure, unspecified whether with hypoxia or hypercapnia ([facility])   . Afib ([facility])   . Allergic rhinitis   . Anemia   . CKD (chronic kidney disease)   . Constipation   . COPD (chronic obstructive pulmonary disease) ([facility])   . Depression   . Diplopia   . Dysphagia   . Edema, lower extremity   . Esophageal dysmotility   . Gait instability   . GERD (gastroesophageal reflux disease)   . Hypertension   . Hypothyroidism   . IBS (irritable bowel syndrome)   . IDA (iron deficiency anemia)   . Insomnia   . Leukocytosis   . Myasthenia gravis (HCC)   . Oral thrush   . PAF (paroxysmal atrial fibrillation) ([facility])   . Physical deconditioning   . Pneumonia 02/2017  . Protein calorie malnutrition ([facility])   . Thyroid disease   . Thyroid mass   . Vertigo     Past Surgical History:  Procedure Laterality Date  . ABDOMINAL HYSTERECTOMY      . APPENDECTOMY    . ESOPHAGOGASTRODUODENOSCOPY (EGD) WITH PROPOFOL N/A 07/21/2017   Procedure: ESOPHAGOGASTRODUODENOSCOPY (EGD) WITH PROPOFOL;  Surgeon: Kerin SalenKarki, Arya, MD;  Location: [facility];  Service: Gastroenterology;  Laterality: N/A;  . TOTAL HIP ARTHROPLASTY Left   . TOTAL KNEE ARTHROPLASTY Left     Family History  Problem Relation Age of Onset  . Hypertension Mother   . Hypertension Son   . Diabetes Neg Hx   . Thyroid disease Neg Hx     Social History   Socioeconomic History  . Marital status: Widowed    Spouse name: Not on file  . Number of children: 2  . Years of education: HS  . Highest education level: Not on file  Occupational History  . Occupation: Retired  Engineer, productionocial Needs  . Financial resource strain: Not on file  . Food insecurity:    Worry: Not on file    Inability: Not on file  . Transportation needs:    Medical: Not on file    Non-medical: Not on file  Tobacco Use  . Smoking status: Never Smoker  . Smokeless tobacco: Never Used  Substance and Sexual Activity  . Alcohol use: No  . Drug use: No  . Sexual activity: Not on file  Lifestyle  . Physical activity:  Days per week: Not on file    Minutes per session: Not on file  . Stress: Not on file  Relationships  . Social connections:    Talks on phone: Not on file    Gets together: Not on file    Attends religious service: Not on file    Active member of club or organization: Not on file    Attends meetings of clubs or organizations: Not on file    Relationship status: Not on file  . Intimate partner violence:    Fear of current or ex partner: Not on file    Emotionally abused: Not on file    Physically abused: Not on file    Forced sexual activity: Not on file  Other Topics Concern  . Not on file  Social History Narrative   Resides at [facility].   Left-handed.   2 cups caffeine per day.     Physical Exam  Vital Signs and Nursing Notes reviewed Vitals:   10/19/17 1408  10/19/17 1502  BP: 136/63 (!) 152/74  Pulse: 78 81  Resp: 16 (!) 24  Temp:  98.2 F (36.8 C)  SpO2: 98% 99%    CONSTITUTIONAL: Elderly-appearing, in moderate distress due to pain NEURO:  Alert and oriented x 3, no focal deficits EYES:  eyes equal and reactive ENT/NECK:  no LAD, no JVD CARDIO: Regular rate, well-perfused, normal S1 and S2 PULM:  CTAB no wheezing or rhonchi GI/GU:  normal bowel sounds, non-distended, non-tender MSK/SPINE: Edema and bruising to bilateral medial and lateral malleoli SKIN:  no rash, atraumatic PSYCH:  Appropriate speech and behavior  Diagnostic and Interventional Summary    EKG Interpretation  Date/Time:    Ventricular Rate:    PR Interval:    QRS Duration:   QT Interval:    QTC Calculation:   R Axis:     Text Interpretation:        Labs Reviewed  COMPREHENSIVE METABOLIC PANEL - Abnormal; Notable for the following components:      Result Value   Glucose, Bld 131 (*)    BUN 39 (*)    All other components within normal limits  CBC    DG Hip Unilat With Pelvis 2-3 Views Right  Final Result    DG Pelvis 1-2 Views  Final Result    DG Ankle Complete Left  Final Result    DG Ankle Complete Right  Final Result    DG Foot Complete Left  Final Result    DG Foot Complete Right  Final Result      Medications  HYDROcodone-acetaminophen (NORCO/VICODIN) 5-325 MG per tablet 1 tablet (1 tablet Oral Given 10/19/17 1523)  HYDROcodone-acetaminophen (NORCO/VICODIN) 5-325 MG per tablet 2 tablet (has no administration in time range)  enoxaparin (LOVENOX) injection 40 mg (has no administration in time range)  ondansetron (ZOFRAN) tablet 4 mg (has no administration in time range)    Or  ondansetron (ZOFRAN) injection 4 mg (has no administration in time range)  amiodarone (PACERONE) tablet 200 mg (has no administration in time range)  amitriptyline (ELAVIL) tablet 50 mg (has no administration in time range)  aspirin EC tablet 81 mg (has no  administration in time range)  fluticasone furoate-vilanterol (BREO ELLIPTA) 100-25 MCG/INH 1 puff ( Inhalation Canceled Entry 10/19/17 1700)  cyclobenzaprine (FLEXERIL) tablet 5 mg (has no administration in time range)  umeclidinium bromide (INCRUSE ELLIPTA) 62.5 MCG/INH 1 puff ( Inhalation Canceled Entry 10/19/17 1700)  lisinopril (PRINIVIL,ZESTRIL) tablet 40 mg (  has no administration in time range)  methimazole (TAPAZOLE) tablet 5 mg (has no administration in time range)  spironolactone (ALDACTONE) tablet 25 mg (has no administration in time range)  sucralfate (CARAFATE) 1 GM/10ML suspension 1 g (has no administration in time range)  vortioxetine HBr (TRINTELLIX) tablet 10 mg (has no administration in time range)  bisacodyl (DULCOLAX) suppository 10 mg (has no administration in time range)  diltiazem (CARDIZEM CD) 24 hr capsule 300 mg (has no administration in time range)  pantoprazole (PROTONIX) EC tablet 80 mg (has no administration in time range)  mycophenolate (CELLCEPT) capsule 500 mg (has no administration in time range)  HYDROcodone-acetaminophen (NORCO/VICODIN) 5-325 MG per tablet 1 tablet (1 tablet Oral Given 10/19/17 1052)     Procedures Critical Care  ED Course and Medical Decision Making  I have reviewed the triage vital signs and the nursing notes.  Pertinent labs & imaging results that were available during my care of the patient were reviewed by me and considered in my medical decision making (see below for details).    84 year old female presenting with bilateral ankle pain after ground-level fall.  Stood from a seated position and noticed that her left foot was numb and felt "asleep".  This caused her to fall.  Numbness quickly resolved, more consistent with nerve compression with no other focal neurological deficit to suggest stroke.  No head trauma or loss of consciousness, no blood thinners.  X-rays revealed bilateral ankle fractures, discussed with orthopedic surgery, no  surgery needed.  Placed in splints.  Discussed case with social work and case management, unable to escalate her care at assisted living over the weekend.  Admitted to hospital service for PT and pain control.  Elmer Sow. Pilar Plate, MD [facility] Emergency Medicine [facility] [email or web address]  Final Clinical Impressions(s) / ED Diagnoses  No diagnosis found.  ED Discharge Orders    None         Sabas Sous, MD 10/19/17 [phone]

## 2017-10-20 DIAGNOSIS — I480 Paroxysmal atrial fibrillation: Secondary | ICD-10-CM | POA: Diagnosis not present

## 2017-10-20 DIAGNOSIS — S82831A Other fracture of upper and lower end of right fibula, initial encounter for closed fracture: Secondary | ICD-10-CM | POA: Diagnosis not present

## 2017-10-20 DIAGNOSIS — I10 Essential (primary) hypertension: Secondary | ICD-10-CM

## 2017-10-20 DIAGNOSIS — S8252XA Displaced fracture of medial malleolus of left tibia, initial encounter for closed fracture: Secondary | ICD-10-CM | POA: Diagnosis not present

## 2017-10-20 LAB — BASIC METABOLIC PANEL
Anion gap: 6 (ref 5–15)
BUN: 32 mg/dL — AB (ref 8–23)
CALCIUM: 8.9 mg/dL (ref 8.9–10.3)
CO2: 26 mmol/L (ref 22–32)
CREATININE: 0.67 mg/dL (ref 0.44–1.00)
Chloride: 105 mmol/L (ref 98–111)
GFR calc non Af Amer: >60 mL/min (ref >60)
Glucose, Bld: 108 mg/dL — ABNORMAL HIGH (ref 70–99)
Potassium: 4.5 mmol/L (ref 3.5–5.1)
Sodium: 137 mmol/L (ref 135–145)

## 2017-10-20 LAB — MRSA PCR SCREENING: MRSA by PCR: NEGATIVE

## 2017-10-20 MED ORDER — HYDROCODONE-ACETAMINOPHEN 5-325 MG PO TABS
1.0000 | ORAL_TABLET | ORAL | Status: DC | PRN
  Administered 2017-10-20 – 2017-10-22 (×4): 2 via ORAL
  Filled 2017-10-20 (×4): qty 2

## 2017-10-20 MED ORDER — ACETAMINOPHEN 325 MG PO TABS: 650.0000 mg | ORAL_TABLET | Freq: Four times a day (QID) | ORAL | Status: DC | PRN

## 2017-10-20 NOTE — Progress Notes (Signed)
PT Cancellation Note  Patient Details Name: Paula Bird MRN: [id number] DOB: 07/23/1925   Cancelled Treatment:    Reason Eval/Treat Not Completed: Active bedrest order;Patient not medically ready; Ortho to see this am, await recommendations;   [facility] Surgical Suit[facility] LLCWILLIAMS,[name] 10/20/2017, 8:59 AM

## 2017-10-20 NOTE — Progress Notes (Signed)
PROGRESS NOTE [facility]   Paula Bird   EXB:[id number] DOB: [birth year]  DOA: 10/19/2017 PCP: Angela Cox, MD   Brief Narrative:  Paula Bird 84 year old female with history of chronic kidney disease, COPD, myasthenia gravis, PAF not on anticoagulation who presented to the emergency department after sustaining a mechanical fall while walking outside her porch.  Upon ED evaluation was found to have bilateral ankle injuries, left medial ankle sprain and right nondisplaced lateral malleolar fracture.  Patient was admitted for orthopedic evaluation and pain control.  Subjective: Patient seen and examined,  continues to complain of pain.  No other concerns at this time.  Assessment & Plan: Left medial ankle sprain with avulsion of deltoid ligament Right ankle nondisplaced fibular fracture Orthopedic surgery was consulted and recommended cam boot x1 week and subsequently placed on cast after swelling is decreased.  She can be full weightbearing on the left ankle and 50% weightbearing on the right.  Patient was evaluated by PT recommending SNF.  Continue pain medication as needed No need for operative fixation at this time  Hypertension BP stable, continue current regimen  Myasthenia gravis Stable, on CellCept  PAF Currently on normal sinus rhythm, patient not on anticoagulation  COPD Stable, no wheezing.  Continue home inhalers  Constipation Continue bowel regimen  DVT prophylaxis: Lovenox Code Status: DNR Family Communication: None at bedside Disposition Plan: SNF when bed available   Consultants:   Orthopedic surgeon  Procedures:   None  Antimicrobials:  None   Objective: Vitals:   10/19/17 2139 10/20/17 0502 10/20/17 0802 10/20/17 1331  BP: 103/71 (!) 111/49  (!) 115/46  Pulse: 70 (!) 57  (!) 56  Resp: 16 14  16   Temp: 99 F (37.2 C) 98.4 F (36.9 C)  97.8 F (36.6 C)  TempSrc: Oral Oral  Oral  SpO2: 98% 95% 98% 99%  Weight:          Intake/Output Summary (Last 24 hours) at 10/20/2017 1452 Last data filed at 10/20/2017 0939 Gross per 24 hour  Intake 360 ml  Output 675 ml  Net -315 ml   Filed Weights   10/19/17 0919  Weight: 63.5 kg (140 lb)    Examination:  General exam: Appears calm and comfortable  Respiratory system: Clear to auscultation. No wheezes,crackle or rhonchi Cardiovascular system: S1 & S2 heard, RRR. Gastrointestinal system: Abdomen is nondistended, soft and nontender. Central nervous system: Alert and oriented. No focal neurological deficits. Extremities: Medial left ankle tenderness, right ankle tenderness over lateral malleolus.  Skin: Ecchymosis of the right lateral ankle over lateral malleolar Psychiatry: Judgement and insight appear normal. Mood & affect appropriate.    Data Reviewed: I have personally reviewed following labs and imaging studies  CBC: Recent Labs  Lab 10/19/17 1126  WBC 10.3  HGB 12.4  HCT 39.3  MCV 86.2  PLT 199   Basic Metabolic Panel: Recent Labs  Lab 10/19/17 1126 10/20/17 0451  NA 137 137  K 4.6 4.5  CL 104 105  CO2 23 26  GLUCOSE 131* 108*  BUN 39* 32*  CREATININE 0.73 0.67  CALCIUM 9.5 8.9   GFR: Estimated Creatinine Clearance: 38.9 mL/min (by C-G formula based on SCr of 0.67 mg/dL). Liver Function Tests: Recent Labs  Lab 10/19/17 1126  AST 18  ALT 17  ALKPHOS 59  BILITOT 0.5  PROT 7.7  ALBUMIN 4.0   No results for input(s): LIPASE, AMYLASE in the last 168 hours. No results for input(s): AMMONIA in the last 168  hours. Coagulation Profile: No results for input(s): INR, PROTIME in the last 168 hours. Cardiac Enzymes: No results for input(s): CKTOTAL, CKMB, CKMBINDEX, TROPONINI in the last 168 hours. BNP (last 3 results) No results for input(s): PROBNP in the last 8760 hours. HbA1C: No results for input(s): HGBA1C in the last 72 hours. CBG: No results for input(s): GLUCAP in the last 168 hours. Lipid Profile: No results for  input(s): CHOL, HDL, LDLCALC, TRIG, CHOLHDL, LDLDIRECT in the last 72 hours. Thyroid Function Tests: No results for input(s): TSH, T4TOTAL, FREET4, T3FREE, THYROIDAB in the last 72 hours. Anemia Panel: No results for input(s): VITAMINB12, FOLATE, FERRITIN, TIBC, IRON, RETICCTPCT in the last 72 hours. Sepsis Labs: No results for input(s): PROCALCITON, LATICACIDVEN in the last 168 hours.  Recent Results (from the past 240 hour(s))  MRSA PCR Screening     Status: None   Collection Time: 10/20/17  5:07 AM  Result Value Ref Range Status   MRSA by PCR NEGATIVE NEGATIVE Final    Comment:        The GeneXpert MRSA Assay (FDA approved for NASAL specimens only), is one component of a comprehensive MRSA colonization surveillance program. It is not intended to diagnose MRSA infection nor to guide or monitor treatment for MRSA infections. Performed at [facility], [address], [place], [place] [address]       Radiology Studies: Dg Pelvis 1-2 Views  Result Date: 10/19/2017 CLINICAL DATA:  Fall yesterday with hip pain, initial encounter EXAM: PELVIS - 1-2 VIEW COMPARISON:  None. FINDINGS: Pelvic ring is intact. There are changes consistent with prior superior and inferior pubic ramus fractures on the right. Postsurgical changes in the left hip are noted. Significant degenerative change in remodeling in the right hip joint are seen. IMPRESSION: Chronic appearing changes as described. The right hip is incompletely evaluated on this exam. If there is clinical suspicion for fracture dedicated films are recommended. Electronically Signed   By: Alcide Clever M.D.   On: 10/19/2017 10:40   Dg Ankle Complete Left  Result Date: 10/19/2017 CLINICAL DATA:  Bilateral ankle and foot pain. EXAM: LEFT ANKLE COMPLETE - 3+ VIEW COMPARISON:  None. FINDINGS: Osteopenia. Avulsion fracture of the medial malleolus with associated soft tissue swelling . Ankle mortise articulations are preserved.  Vascular calcifications noted within the soft tissues. IMPRESSION: Avulsion fracture of the medial malleolus with associated soft tissue swelling. Electronically Signed   By: Ted Mcalpine M.D.   On: 10/19/2017 09:51   Dg Ankle Complete Right  Result Date: 10/19/2017 CLINICAL DATA:  Recent fall with ankle pain and swelling, initial encounter EXAM: RIGHT ANKLE - COMPLETE 3+ VIEW COMPARISON:  None. FINDINGS: Oblique fracture through the distal fibular metaphysis is identified. Associated soft tissue swelling is noted. No other fracture or dislocation is seen. Vascular calcifications are noted. IMPRESSION: Distal fibular fracture with associated soft tissue changes. Electronically Signed   By: Alcide Clever M.D.   On: 10/19/2017 09:51   Dg Foot Complete Left  Result Date: 10/19/2017 CLINICAL DATA:  Bilateral feet and ankles pain post fall. EXAM: LEFT FOOT - COMPLETE 3+ VIEW COMPARISON:  None. FINDINGS: Osteopenia. There is a transverse nondisplaced fracture of the fifth metatarsal head. Avulsion fracture of the medial malleolus is also noted. Chronic hallux valgus deformity of the first ray. Mild soft tissue swelling. IMPRESSION: Transverse nondisplaced fracture of the left fifth metatarsal head. Avulsion fracture of the left medial malleolus. Osteopenia. Electronically Signed   By: Ulanda Edison.D.  On: 10/19/2017 09:54   Dg Foot Complete Right  Result Date: 10/19/2017 CLINICAL DATA:  Fall yesterday with right ankle pain and swelling, initial encounter EXAM: RIGHT FOOT COMPLETE - 3+ VIEW COMPARISON:  None. FINDINGS: Distal fibular fracture is again identified. Healed fractures of the second and third metatarsals are seen. Hallux valgus deformity is noted. No other fracture is noted. Degenerative changes of the tarsal bones are noted. IMPRESSION: Fibular fracture is again noted. No acute abnormality in the foot is seen. Electronically Signed   By: Alcide CleverMark  Lukens M.D.   On: 10/19/2017 09:56    Dg Hip Unilat With Pelvis 2-3 Views Right  Result Date: 10/19/2017 CLINICAL DATA:  Acute RIGHT hip pain following fall yesterday. Initial encounter. EXAM: DG HIP (WITH OR WITHOUT PELVIS) 2-3V RIGHT COMPARISON:  11/13/2015 radiographs FINDINGS: No acute fracture or dislocation. Moderate joint space narrowing and osteophytosis noted. Remote fractures of the RIGHT SUPERIOR and INFERIOR pubic rami again identified. IMPRESSION: 1. No acute bony abnormality 2. Moderate RIGHT hip degenerative changes. Electronically Signed   By: Harmon PierJeffrey  Hu M.D.   On: 10/19/2017 11:52      Scheduled Meds: . amiodarone  200 mg Oral Daily  . amitriptyline  50 mg Oral QHS  . aspirin EC  81 mg Oral Daily  . bisacodyl  10 mg Rectal Daily  . diltiazem  300 mg Oral Daily  . enoxaparin (LOVENOX) injection  40 mg Subcutaneous Q24H  . fluticasone furoate-vilanterol  1 puff Inhalation Daily  . lisinopril  40 mg Oral Daily  . methimazole  5 mg Oral Daily  . mycophenolate  500 mg Oral BID  . pantoprazole  80 mg Oral Daily  . spironolactone  25 mg Oral Daily  . sucralfate  1 g Oral TID WC & HS  . umeclidinium bromide  1 puff Inhalation Daily  . vortioxetine HBr  10 mg Oral Daily   Continuous Infusions:   LOS: 1 day    Time spent: Total of 25 minutes spent with pt, greater than 50% of which was spent in discussion of  treatment, counseling and coordination of care   Latrelle DodrillEdwin Silva, MD Pager: Text Page via [email or web address]   If 7PM-7AM, please contact night-coverage [email or web address] 10/20/2017, 2:52 PM   Note - This record has been created using [employer] software. Chart creation errors have been sought, but may not always have been located. Such creation errors do not reflect on the standard of medical care.

## 2017-10-20 NOTE — Consult Note (Signed)
Reason for Consult: Fall with bilateral ankle injuries.  Left medial ankle sprain and right nondisplaced lateral malleolar fracture, closed. Referring Physician: Dr. Patrecia Pour  Paula Bird is an 84 y.o. female.  HPI: 84 year old female with history of chronic kidney disease, COPD, myasthenia gravis was up took 3 steps outside her porch and then tripped and fell with the above injuries.  She is brought to the emergency room unable to ambulate due to pain in her ankles.  Patient stays in assisted living facility.  Past Medical History:  Diagnosis Date  . Acute and chronic respiratory failure, unspecified whether with hypoxia or hypercapnia ([place])   . Afib ([place])   . Allergic rhinitis   . Anemia   . CKD (chronic kidney disease)   . Constipation   . COPD (chronic obstructive pulmonary disease) ([place])   . Depression   . Diplopia   . Dysphagia   . Edema, lower extremity   . Esophageal dysmotility   . Gait instability   . GERD (gastroesophageal reflux disease)   . Hypertension   . Hypothyroidism   . IBS (irritable bowel syndrome)   . IDA (iron deficiency anemia)   . Insomnia   . Leukocytosis   . Myasthenia gravis ([place])   . Oral thrush   . PAF (paroxysmal atrial fibrillation) ([place])   . Physical deconditioning   . Pneumonia 02/2017  . Protein calorie malnutrition ([place])   . Thyroid disease   . Thyroid mass   . Vertigo     Past Surgical History:  Procedure Laterality Date  . ABDOMINAL HYSTERECTOMY    . APPENDECTOMY    . ESOPHAGOGASTRODUODENOSCOPY (EGD) WITH PROPOFOL N/A 07/21/2017   Procedure: ESOPHAGOGASTRODUODENOSCOPY (EGD) WITH PROPOFOL;  Surgeon: Ronnette Juniper, MD;  Location: [facility] ENDOSCOPY;  Service: Gastroenterology;  Laterality: N/A;  . TOTAL HIP ARTHROPLASTY Left   . TOTAL KNEE ARTHROPLASTY Left     Family History  Problem Relation Age of Onset  . Hypertension Mother   . Hypertension Son   . Diabetes Neg Hx   . Thyroid disease Neg Hx     Social History:   reports that she has never smoked. She has never used smokeless tobacco. She reports that she does not drink alcohol or use drugs.  Allergies: No Known Allergies  Medications: I have reviewed the patient's current medications.  Results for orders placed or performed during the hospital encounter of 10/19/17 (from the past 48 hour(s))  CBC     Status: None   Collection Time: 10/19/17 11:26 AM  Result Value Ref Range   WBC 10.3 4.0 - 10.5 K/uL   RBC 4.56 3.87 - 5.11 MIL/uL   Hemoglobin 12.4 12.0 - 15.0 g/dL   HCT 39.3 36.0 - 46.0 %   MCV 86.2 78.0 - 100.0 fL   MCH 27.2 26.0 - 34.0 pg   MCHC 31.6 30.0 - 36.0 g/dL   RDW 15.4 11.5 - 15.5 %   Platelets 199 150 - 400 K/uL    Comment: Performed at [facility], [place] [address] [place], [place] [address]  Comprehensive metabolic panel     Status: Abnormal   Collection Time: 10/19/17 11:26 AM  Result Value Ref Range   Sodium 137 135 - 145 mmol/L   Potassium 4.6 3.5 - 5.1 mmol/L   Chloride 104 98 - 111 mmol/L   CO2 23 22 - 32 mmol/L   Glucose, Bld 131 (H) 70 - 99 mg/dL   BUN 39 (H) 8 - 23  mg/dL   Creatinine, Ser 0.73 0.44 - 1.00 mg/dL   Calcium 9.5 8.9 - 10.3 mg/dL   Total Protein 7.7 6.5 - 8.1 g/dL   Albumin 4.0 3.5 - 5.0 g/dL   AST 18 15 - 41 U/L   ALT 17 0 - 44 U/L   Alkaline Phosphatase 59 38 - 126 U/L   Total Bilirubin 0.5 0.3 - 1.2 mg/dL   GFR calc non Af Amer >60 >60 mL/min   GFR calc Af Amer >60 >60 mL/min    Comment: (NOTE) The eGFR has been calculated using the CKD EPI equation. This calculation has not been validated in all clinical situations. eGFR's persistently <60 mL/min signify possible Chronic Kidney Disease.    Anion gap 10 5 - 15    Comment: Performed at [facility], [place] [address] [place], [place] [address]  Basic metabolic panel     Status: Abnormal   Collection Time: 10/20/17  4:51 AM  Result Value Ref Range   Sodium 137 135 - 145 mmol/L   Potassium 4.5 3.5 - 5.1  mmol/L   Chloride 105 98 - 111 mmol/L   CO2 26 22 - 32 mmol/L   Glucose, Bld 108 (H) 70 - 99 mg/dL   BUN 32 (H) 8 - 23 mg/dL   Creatinine, Ser 0.67 0.44 - 1.00 mg/dL   Calcium 8.9 8.9 - 10.3 mg/dL   GFR calc non Af Amer >60 >60 mL/min   GFR calc Af Amer >60 >60 mL/min    Comment: (NOTE) The eGFR has been calculated using the CKD EPI equation. This calculation has not been validated in all clinical situations. eGFR's persistently <60 mL/min signify possible Chronic Kidney Disease.    Anion gap 6 5 - 15    Comment: Performed at [facility], [place] [address], [place], [place] [address]  MRSA PCR Screening     Status: None   Collection Time: 10/20/17  5:07 AM  Result Value Ref Range   MRSA by PCR NEGATIVE NEGATIVE    Comment:        The GeneXpert MRSA Assay (FDA approved for NASAL specimens only), is one component of a comprehensive MRSA colonization surveillance program. It is not intended to diagnose MRSA infection nor to guide or monitor treatment for MRSA infections. Performed at [facility], [place] [address], [place], [place] [address]     Dg Pelvis 1-2 Views  Result Date: 10/19/2017 CLINICAL DATA:  Fall yesterday with hip pain, initial encounter EXAM: PELVIS - 1-2 VIEW COMPARISON:  None. FINDINGS: Pelvic ring is intact. There are changes consistent with prior superior and inferior pubic ramus fractures on the right. Postsurgical changes in the left hip are noted. Significant degenerative change in remodeling in the right hip joint are seen. IMPRESSION: Chronic appearing changes as described. The right hip is incompletely evaluated on this exam. If there is clinical suspicion for fracture dedicated films are recommended. Electronically Signed   By: Inez Catalina M.D.   On: 10/19/2017 10:40   Dg Ankle Complete Left  Result Date: 10/19/2017 CLINICAL DATA:  Bilateral ankle and foot pain. EXAM: LEFT ANKLE COMPLETE - 3+ VIEW COMPARISON:  None.  FINDINGS: Osteopenia. Avulsion fracture of the medial malleolus with associated soft tissue swelling . Ankle mortise articulations are preserved. Vascular calcifications noted within the soft tissues. IMPRESSION: Avulsion fracture of the medial malleolus with associated soft tissue swelling. Electronically Signed   By: Fidela Salisbury M.D.   On: 10/19/2017  09:51   Dg Ankle Complete Right  Result Date: 10/19/2017 CLINICAL DATA:  Recent fall with ankle pain and swelling, initial encounter EXAM: RIGHT ANKLE - COMPLETE 3+ VIEW COMPARISON:  None. FINDINGS: Oblique fracture through the distal fibular metaphysis is identified. Associated soft tissue swelling is noted. No other fracture or dislocation is seen. Vascular calcifications are noted. IMPRESSION: Distal fibular fracture with associated soft tissue changes. Electronically Signed   By: Inez Catalina M.D.   On: 10/19/2017 09:51   Dg Foot Complete Left  Result Date: 10/19/2017 CLINICAL DATA:  Bilateral feet and ankles pain post fall. EXAM: LEFT FOOT - COMPLETE 3+ VIEW COMPARISON:  None. FINDINGS: Osteopenia. There is a transverse nondisplaced fracture of the fifth metatarsal head. Avulsion fracture of the medial malleolus is also noted. Chronic hallux valgus deformity of the first ray. Mild soft tissue swelling. IMPRESSION: Transverse nondisplaced fracture of the left fifth metatarsal head. Avulsion fracture of the left medial malleolus. Osteopenia. Electronically Signed   By: Fidela Salisbury M.D.   On: 10/19/2017 09:54   Dg Foot Complete Right  Result Date: 10/19/2017 CLINICAL DATA:  Fall yesterday with right ankle pain and swelling, initial encounter EXAM: RIGHT FOOT COMPLETE - 3+ VIEW COMPARISON:  None. FINDINGS: Distal fibular fracture is again identified. Healed fractures of the second and third metatarsals are seen. Hallux valgus deformity is noted. No other fracture is noted. Degenerative changes of the tarsal bones are noted. IMPRESSION:  Fibular fracture is again noted. No acute abnormality in the foot is seen. Electronically Signed   By: Inez Catalina M.D.   On: 10/19/2017 09:56   Dg Hip Unilat With Pelvis 2-3 Views Right  Result Date: 10/19/2017 CLINICAL DATA:  Acute RIGHT hip pain following fall yesterday. Initial encounter. EXAM: DG HIP (WITH OR WITHOUT PELVIS) 2-3V RIGHT COMPARISON:  11/13/2015 radiographs FINDINGS: No acute fracture or dislocation. Moderate joint space narrowing and osteophytosis noted. Remote fractures of the RIGHT SUPERIOR and INFERIOR pubic rami again identified. IMPRESSION: 1. No acute bony abnormality 2. Moderate RIGHT hip degenerative changes. Electronically Signed   By: Margarette Canada M.D.   On: 10/19/2017 11:52    ROS 14 point review of systems performed.  Positive for GERD, chronic obstructive bronchitis, previous knee and hip arthroplasty done in [place].  Bradycardia, COPD, myasthenia gravis history, dyspnea, history of bradycardia, hypertension hypothyroidism history of UTIs, past parainfluenza infection virus 4, PAT, pulmonary hypertension otherwise negative as it pertains HPI. Blood pressure (!) 111/49, pulse (!) 57, temperature 98.4 F (36.9 C), temperature source Oral, resp. rate 14, weight 140 lb (63.5 kg), SpO2 98 %. Physical Exam  Constitutional: She is oriented to person, place, and time. She appears well-developed and well-nourished.  HENT:  Head: Normocephalic and atraumatic.  Eyes: Pupils are equal, round, and reactive to light.  Neck: Normal range of motion. Neck supple. No tracheal deviation present. No thyromegaly present.  Cardiovascular: Normal rate and regular rhythm.  Respiratory: Effort normal and breath sounds normal.  GI: Soft. Bowel sounds are normal.  Musculoskeletal: She exhibits tenderness. She exhibits no edema or deformity.  Medial deltoid tenderness of the left ankle.  Syndesmosis is normal no tenderness over the fibula.  Lateral ankle complex of the left ankle is  normal.  Opposite right ankle shows tenderness over the lateral malleolus.  No tender numbness over the deltoid ligament.  Patient's been in a cam boot.``````  Neurological: She is alert and oriented to person, place, and time.  Skin: Skin is warm and dry.  Ecchymosis right lateral ankle over the lateral malleolus.  Psychiatric: She has a normal mood and affect. Her behavior is normal. Thought content normal.    Assessment/Plan: Left medial ankle sprain with tiny chip avulsion off the medial malleolus consistent with a deltoid ligament injury.  Opposite right ankle is more severely injured with a nondisplaced fibular fracture that will not require operative fixation.  We plan Cam boot x1 week then we can go to a cast once the swelling is down.  She will be full weightbearing on the left ankle and 50% weightbearing on the right. My cell [phone]  Paula Bird 10/20/2017, 10:42 AM

## 2017-10-20 NOTE — Evaluation (Signed)
Physical Therapy Evaluation Patient Details Name: Paula Bird MRN: [id number] DOB: [birth year] Today's Date: 10/20/2017   History of Present Illness  84 year old female with history of chronic kidney disease, COPD, myasthenia gravis was up took 3 steps outside her porch and then tripped and fell resulting in Left medial ankle sprain and small avulsion fx  and right nondisplaced lateral malleolar fracture, closed.  Clinical Impression  Pt admitted with above diagnosis. Pt currently with functional limitations due to the deficits listed below (see PT Problem List).  Pt requiring max to max assist of 2 for basic mobility; pt reports at least 2 falls in last 3 wks as well as functional decline since her last hospitalization for N/V/D which has not resolved (ongoing for last 6 mos);  recommend SNF post acute, pt would be unable to manage at her ALF at current status, she is certainly at risk for continued falls; will follow in acute setting Pt will benefit from skilled PT to increase their independence and safety with mobility to allow discharge to the venue listed below.       Follow Up Recommendations SNF    Equipment Recommendations  None recommended by PT    Recommendations for Other Services       Precautions / Restrictions Precautions Precautions: Fall Required Braces or Orthoses: Other Brace/Splint Other Brace/Splint: cam boot on RLE, ASO LLE Restrictions Weight Bearing Restrictions: Yes RLE Weight Bearing: Partial weight bearing RLE Partial Weight Bearing Percentage or Pounds: 50% LLE Weight Bearing: Weight bearing as tolerated      Mobility  Bed Mobility Overal bed mobility: Needs Assistance Bed Mobility: Supine to Sit;Sit to Supine     Supine to sit: Min assist Sit to supine: Max assist   General bed mobility comments: incr time, effortful transition to sit; cues for sequencing; assist with trunk and bil LEs on return to supine  Transfers Overall transfer level:  Needs assistance Equipment used: Rolling walker (2 wheeled) Transfers: Sit to/from Stand Sit to Stand: +2 physical assistance;+2 safety/equipment;Max assist         General transfer comment: assist for anterior-superior wt translation, significant posterior bias ins tnding with difficulty achieving hip/trunk extension  Ambulation/Gait             General Gait Details: unable due to pain  Stairs            Wheelchair Mobility    Modified Rankin (Stroke Patients Only)       Balance Overall balance assessment: Needs assistance;History of Falls(2 falls in last 3 wks) Sitting-balance support: Single extremity supported;Feet supported Sitting balance-Leahy Scale: Fair       Standing balance-Leahy Scale: Poor Standing balance comment: reliant on UEs and physical assist to maintain static stand                             Pertinent Vitals/Pain Pain Assessment: 0-10 Pain Score: (severe pain with standing 10/10, decr at rest) Pain Location: bil ankles Pain Descriptors / Indicators: Sore;Discomfort;Grimacing Pain Intervention(s): Limited activity within patient's tolerance;Monitored during session;Premedicated before session    Home Living Family/patient expects to be discharged to:: Assisted living([facility])               Home Equipment: Walker - 4 wheels;Wheelchair - manual      Prior Function Level of Independence: Independent with assistive device(s)         Comments: pt stated she performs her own ADLs. Uses rollator for  ambulation, amb to dining room for meals     Hand Dominance        Extremity/Trunk Assessment   Upper Extremity Assessment Upper Extremity Assessment: Defer to OT evaluation    Lower Extremity Assessment Lower Extremity Assessment: RLE deficits/detail;LLE deficits/detail RLE Deficits / Details: knee and hip AROM grossly WFL, strength 3+/5 RLE: Unable to fully assess due to pain;Unable to fully assess due to  immobilization RLE Coordination: decreased gross motor LLE Deficits / Details: knee and hip AROM grossly WFL, strength 3+/5 LLE: Unable to fully assess due to pain LLE Coordination: decreased gross motor       Communication   Communication: No difficulties  Cognition Arousal/Alertness: Awake/alert Behavior During Therapy: WFL for tasks assessed/performed Overall Cognitive Status: Within Functional Limits for tasks assessed                                        General Comments      Exercises     Assessment/Plan    PT Assessment Patient needs continued PT services  PT Problem List Decreased strength;Decreased activity tolerance;Decreased mobility;Decreased balance;Decreased safety awareness;Decreased knowledge of precautions;Decreased knowledge of use of DME;Decreased coordination;Pain       PT Treatment Interventions DME instruction;Therapeutic activities;Gait training;Therapeutic exercise;Patient/family education;Functional mobility training;Balance training    PT Goals (Current goals can be found in the Care Plan section)  Acute Rehab PT Goals Patient Stated Goal: to feel better PT Goal Formulation: With patient Time For Goal Achievement: 11/03/17 Potential to Achieve Goals: Good    Frequency Min 3X/week   Barriers to discharge        Co-evaluation               AM-PAC PT "6 Clicks" Daily Activity  Outcome Measure Difficulty turning over in bed (including adjusting bedclothes, sheets and blankets)?: Unable Difficulty moving from lying on back to sitting on the side of the bed? : Unable Difficulty sitting down on and standing up from a chair with arms (e.g., wheelchair, bedside commode, etc,.)?: Unable Help needed moving to and from a bed to chair (including a wheelchair)?: Total Help needed walking in hospital room?: Total Help needed climbing 3-5 steps with a railing? : Total 6 Click Score: 6    End of Session Equipment Utilized  During Treatment: Gait belt Activity Tolerance: Patient limited by pain;Patient limited by fatigue;Other (comment);Treatment limited secondary to medical complications (Comment)(Nausea) Patient left: in bed;with call bell/phone within reach;with bed alarm set Nurse Communication: Mobility status PT Visit Diagnosis: Muscle weakness (generalized) (M62.81);Repeated falls (R29.6);Unsteadiness on feet (R26.81);Other abnormalities of gait and mobility (R26.89)    Time: [id number] PT Time Calculation (min) (ACUTE ONLY): 23 min   Charges:   PT Evaluation $PT Eval Moderate Complexity: 1 Mod PT Treatments $Therapeutic Activity: 8-22 mins        Drucilla Chaletara Baine, PT Pager: [phone] 10/20/2017   Drucilla ChaletWILLIAMS,Fleetwood Pierron 10/20/2017, 12:01 PM

## 2017-10-20 NOTE — Progress Notes (Signed)
OT Cancellation Note  Patient Details Name: Lorenza Evangelistvelyn Dandy MRN: [id number] DOB: 1925-10-22   Cancelled Treatment:    Reason Eval/Treat Not Completed: Active bedrest order;Other (comment) Note pt is on bedrest and ortho to see pt this am. Will await updated activity orders and weightbearing clarification.   Zannie KehrStephanie S Shai Rasmussen 10/20/2017, 8:42 AM

## 2017-10-21 DIAGNOSIS — I10 Essential (primary) hypertension: Secondary | ICD-10-CM | POA: Diagnosis not present

## 2017-10-21 DIAGNOSIS — S82892A Other fracture of left lower leg, initial encounter for closed fracture: Secondary | ICD-10-CM | POA: Diagnosis not present

## 2017-10-21 DIAGNOSIS — S8252XA Displaced fracture of medial malleolus of left tibia, initial encounter for closed fracture: Secondary | ICD-10-CM

## 2017-10-21 DIAGNOSIS — S82891A Other fracture of right lower leg, initial encounter for closed fracture: Secondary | ICD-10-CM

## 2017-10-21 DIAGNOSIS — S8252XD Displaced fracture of medial malleolus of left tibia, subsequent encounter for closed fracture with routine healing: Secondary | ICD-10-CM | POA: Diagnosis not present

## 2017-10-21 DIAGNOSIS — I480 Paroxysmal atrial fibrillation: Secondary | ICD-10-CM | POA: Diagnosis not present

## 2017-10-21 DIAGNOSIS — S82831D Other fracture of upper and lower end of right fibula, subsequent encounter for closed fracture with routine healing: Secondary | ICD-10-CM | POA: Diagnosis not present

## 2017-10-21 NOTE — Progress Notes (Signed)
PROGRESS NOTE [facility]   Paula Evangelistvelyn Shelley   WUJ:[id number] DOB: September 17, 1925  DOA: 10/19/2017 PCP: Angela Coxasanayaka, Gayani Y, MD   Brief Narrative:  Paula Bird 84 year old female with history of chronic kidney disease, COPD, myasthenia gravis, PAF not on anticoagulation who presented to the emergency department after sustaining a mechanical fall while walking outside her porch.  Upon ED evaluation was found to have bilateral ankle injuries, left medial ankle sprain and right nondisplaced lateral malleolar fracture.  Patient was admitted for orthopedic evaluation and pain control.  Subjective: Patient seen and examined, doing ok, this morning with pain on both of her ankles. Up with therapy   Assessment & Plan: Left medial ankle sprain with avulsion of deltoid ligament Right ankle nondisplaced fibular fracture Orthopedic surgery was consulted and recommended cam boot x1 week and subsequently placed on cast after swelling is decreased.  She can be full weightbearing on the left ankle and 50% weightbearing on the right. No need for operative fixation at this time. Pain control as needed. Awaiting SNF placement. Follow up with Dr. Ophelia CharterYates in 2 weeks.   Hypertension BP remains stable, on diltiazem and lisinopril   Myasthenia gravis Stable, on CellCept  PAF Remains in NSR, patient not on anticoagulation  COPD Stable, no wheezing.  Continue home inhalers  Constipation Continue bowel regimen  DVT prophylaxis: Lovenox Code Status: DNR Family Communication: None at bedside Disposition Plan: SNF when bed available   Consultants:   Orthopedic surgeon  Procedures:   None  Antimicrobials:  None   Objective: Vitals:   10/21/17 0905 10/21/17 0907 10/21/17 0945 10/21/17 1344  BP:   114/63 (!) 113/53  Pulse:    62  Resp:    19  Temp:    98.3 F (36.8 C)  TempSrc:    Oral  SpO2: 95% 96%  99%  Weight:        Intake/Output Summary (Last 24 hours) at 10/21/2017  1646 Last data filed at 10/21/2017 1309 Gross per 24 hour  Intake 600 ml  Output 300 ml  Net 300 ml   Filed Weights   10/19/17 0919  Weight: 63.5 kg (140 lb)    Examination:  General: NAD Cardiovascular: RRR, S1/S2 +, no rubs, no gallops Respiratory: CTA bilaterally, no wheezing, no rhonchi Abdominal: Soft, NT, ND, bowel sounds + Extremities: boot cam in place on the R, ankle brace on the L   Data Reviewed: I have personally reviewed following labs and imaging studies  CBC: Recent Labs  Lab 10/19/17 1126  WBC 10.3  HGB 12.4  HCT 39.3  MCV 86.2  PLT 199   Basic Metabolic Panel: Recent Labs  Lab 10/19/17 1126 10/20/17 0451  NA 137 137  K 4.6 4.5  CL 104 105  CO2 23 26  GLUCOSE 131* 108*  BUN 39* 32*  CREATININE 0.73 0.67  CALCIUM 9.5 8.9   GFR: Estimated Creatinine Clearance: 38.9 mL/min (by C-G formula based on SCr of 0.67 mg/dL). Liver Function Tests: Recent Labs  Lab 10/19/17 1126  AST 18  ALT 17  ALKPHOS 59  BILITOT 0.5  PROT 7.7  ALBUMIN 4.0   No results for input(s): LIPASE, AMYLASE in the last 168 hours. No results for input(s): AMMONIA in the last 168 hours. Coagulation Profile: No results for input(s): INR, PROTIME in the last 168 hours. Cardiac Enzymes: No results for input(s): CKTOTAL, CKMB, CKMBINDEX, TROPONINI in the last 168 hours. BNP (last 3 results) No results for input(s): PROBNP in the last 8760  hours. HbA1C: No results for input(s): HGBA1C in the last 72 hours. CBG: No results for input(s): GLUCAP in the last 168 hours. Lipid Profile: No results for input(s): CHOL, HDL, LDLCALC, TRIG, CHOLHDL, LDLDIRECT in the last 72 hours. Thyroid Function Tests: No results for input(s): TSH, T4TOTAL, FREET4, T3FREE, THYROIDAB in the last 72 hours. Anemia Panel: No results for input(s): VITAMINB12, FOLATE, FERRITIN, TIBC, IRON, RETICCTPCT in the last 72 hours. Sepsis Labs: No results for input(s): PROCALCITON, LATICACIDVEN in the last  168 hours.  Recent Results (from the past 240 hour(s))  MRSA PCR Screening     Status: None   Collection Time: 10/20/17  5:07 AM  Result Value Ref Range Status   MRSA by PCR NEGATIVE NEGATIVE Final    Comment:        The GeneXpert MRSA Assay (FDA approved for NASAL specimens only), is one component of a comprehensive MRSA colonization surveillance program. It is not intended to diagnose MRSA infection nor to guide or monitor treatment for MRSA infections. Performed at [facility], [address], [place], [place] [address]       Radiology Studies: No results found.    Scheduled Meds: . amiodarone  200 mg Oral Daily  . amitriptyline  50 mg Oral QHS  . aspirin EC  81 mg Oral Daily  . bisacodyl  10 mg Rectal Daily  . diltiazem  300 mg Oral Daily  . enoxaparin (LOVENOX) injection  40 mg Subcutaneous Q24H  . fluticasone furoate-vilanterol  1 puff Inhalation Daily  . lisinopril  40 mg Oral Daily  . methimazole  5 mg Oral Daily  . mycophenolate  500 mg Oral BID  . pantoprazole  80 mg Oral Daily  . spironolactone  25 mg Oral Daily  . sucralfate  1 g Oral TID WC & HS  . umeclidinium bromide  1 puff Inhalation Daily  . vortioxetine HBr  10 mg Oral Daily   Continuous Infusions:   LOS: 2 days    Time spent: Total of 15 minutes spent with pt, greater than 50% of which was spent in discussion of  treatment, counseling and coordination of care   Latrelle Dodrill, MD Pager: Text Page via [email or web address]   If 7PM-7AM, please contact night-coverage [email or web address] 10/21/2017, 4:46 PM   Note - This record has been created using [employer]. Chart creation errors have been sought, but may not always have been located. Such creation errors do not reflect on the standard of medical care.

## 2017-10-21 NOTE — NC FL2 (Signed)
[place] MEDICAID FL2 LEVEL OF CARE SCREENING TOOL     IDENTIFICATION  Patient Name: Paula Bird Birthdate: [birth year] Sex: female Admission Date (Current Location): 10/19/2017  [facility] and [place] Number:  Producer, television/film/video and Address:  [facility],  [address] [place], [place] [address]      Provider Number: [id number]  Attending Physician Name and Address:  Randel Pigg, Dorma Russell, MD  Relative Name and Phone Number:       Current Level of Care: Hospital Recommended Level of Care: Skilled Nursing Facility Prior Approval Number:    Date Approved/Denied:   PASRR Number: [id number]  Discharge Plan: SNF    Current Diagnoses: Patient Active Problem List   Diagnosis Date Noted  . Fibula fracture 10/19/2017  . Avulsion fracture of medial malleolus of left tibia 10/19/2017  . UTI (urinary tract infection) 07/17/2017  . Infection due to parainfluenza virus 4 03/06/2017  . Gait abnormality 06/07/2016  . Hyperthyroidism 04/24/2016  . Myasthenia gravis (HCC) 02/07/2016  . Ptosis of eyelid, right 01/31/2016  . Pain, eye, right 01/31/2016  . Falling 01/31/2016  . Vertigo 10/08/2015  . Colon polyp 08/30/2015  . Arthritis, degenerative 08/30/2015  . Acid indigestion 08/30/2015  . Psoriasis 08/30/2015  . Avitaminosis D 08/30/2015  . HCAP (healthcare-associated pneumonia) 08/08/2015  . Respiratory distress 08/08/2015  . Essential hypertension 08/08/2015  . COPD exacerbation (HCC) 08/08/2015  . Dyspnea 08/04/2015  . Hypertension 08/04/2015  . Depression 08/04/2015  . Insomnia 08/04/2015  . Obstructive chronic bronchitis with acute exacerbation (HCC) 08/04/2015  . Thrombocytopenia (HCC) 08/04/2015  . Paroxysmal atrial fibrillation (HCC) 08/04/2015  . Acute exacerbation of chronic obstructive bronchitis (HCC) 08/04/2015  . Infection of urinary tract 07/01/2014  . Bradycardia 11/24/2013  . Fall in home 11/22/2013  . Pulmonary hypertension (HCC) 11/19/2013   . Excessive falling 09/07/2012  . Encounter for general adult medical examination without abnormal findings 03/24/2012  . Atrial paroxysmal tachycardia (HCC) 10/05/2011  . Family history of colon cancer 05/15/2011  . Mass of pelvis 05/15/2011    Orientation RESPIRATION BLADDER Height & Weight     Self, Time, Situation, Place  Normal Continent Weight: 140 lb (63.5 kg) Height:     BEHAVIORAL SYMPTOMS/MOOD NEUROLOGICAL BOWEL NUTRITION STATUS      Continent Diet  AMBULATORY STATUS COMMUNICATION OF NEEDS Skin   Extensive Assist Verbally Surgical wounds                       Personal Care Assistance Level of Assistance  Bathing, Feeding, Dressing Bathing Assistance: Maximum assistance Feeding assistance: Independent Dressing Assistance: Maximum assistance     Functional Limitations Info  Sight, Hearing, Speech Sight Info: Impaired Hearing Info: Adequate Speech Info: Adequate    SPECIAL CARE FACTORS FREQUENCY  PT (By licensed PT), OT (By licensed OT)     PT Frequency: 5X/WEEK OT Frequency: 5X/WEEK             Contractures Contractures Info: Not present    Additional Factors Info  Code Status, Allergies, Psychotropic Code Status Info: DNR  Allergies Info: No Known Allergies           Current Medications (10/21/2017):  This is the current hospital active medication list Current Facility-Administered Medications  Medication Dose Route Frequency Provider Last Rate Last Dose  . acetaminophen (TYLENOL) tablet 650 mg  650 mg Oral Q6H PRN Randel Pigg, Dorma Russell, MD      . amiodarone (PACERONE) tablet 200 mg  200 mg Oral  Daily Calvert Cantorizwan, Saima, MD   200 mg at 10/21/17 1000  . amitriptyline (ELAVIL) tablet 50 mg  50 mg Oral QHS Calvert Cantorizwan, Saima, MD   50 mg at 10/20/17 2128  . aspirin EC tablet 81 mg  81 mg Oral Daily Calvert Cantorizwan, Saima, MD   81 mg at 10/21/17 0945  . bisacodyl (DULCOLAX) suppository 10 mg  10 mg Rectal Daily Calvert Cantorizwan, Saima, MD   10 mg at 10/20/17 1009  .  cyclobenzaprine (FLEXERIL) tablet 5 mg  5 mg Oral TID PRN Calvert Cantorizwan, Saima, MD      . diltiazem (CARDIZEM CD) 24 hr capsule 300 mg  300 mg Oral Daily Calvert Cantorizwan, Saima, MD   300 mg at 10/21/17 0945  . enoxaparin (LOVENOX) injection 40 mg  40 mg Subcutaneous Q24H Calvert Cantorizwan, Saima, MD   40 mg at 10/20/17 1827  . fluticasone furoate-vilanterol (BREO ELLIPTA) 100-25 MCG/INH 1 puff  1 puff Inhalation Daily Calvert Cantorizwan, Saima, MD   1 puff at 10/21/17 0904  . HYDROcodone-acetaminophen (NORCO/VICODIN) 5-325 MG per tablet 1-2 tablet  1-2 tablet Oral Q4H PRN Randel PiggSilva Zapata, Dorma RussellEdwin, MD   2 tablet at 10/21/17 0945  . lisinopril (PRINIVIL,ZESTRIL) tablet 40 mg  40 mg Oral Daily Calvert Cantorizwan, Saima, MD   40 mg at 10/20/17 1003  . methimazole (TAPAZOLE) tablet 5 mg  5 mg Oral Daily Calvert Cantorizwan, Saima, MD   5 mg at 10/21/17 1000  . mycophenolate (CELLCEPT) capsule 500 mg  500 mg Oral BID Calvert Cantorizwan, Saima, MD   500 mg at 10/21/17 1000  . ondansetron (ZOFRAN) tablet 4 mg  4 mg Oral Q6H PRN Calvert Cantorizwan, Saima, MD   4 mg at 10/20/17 2034   Or  . ondansetron (ZOFRAN) injection 4 mg  4 mg Intravenous Q6H PRN Rizwan, Ladell HeadsSaima, MD      . pantoprazole (PROTONIX) EC tablet 80 mg  80 mg Oral Daily Calvert Cantorizwan, Saima, MD   80 mg at 10/21/17 0945  . spironolactone (ALDACTONE) tablet 25 mg  25 mg Oral Daily Calvert Cantorizwan, Saima, MD   25 mg at 10/21/17 1000  . sucralfate (CARAFATE) 1 GM/10ML suspension 1 g  1 g Oral TID WC & HS Calvert Cantorizwan, Saima, MD   1 g at 10/21/17 0946  . umeclidinium bromide (INCRUSE ELLIPTA) 62.5 MCG/INH 1 puff  1 puff Inhalation Daily Calvert Cantorizwan, Saima, MD   1 puff at 10/21/17 0905  . vortioxetine HBr (TRINTELLIX) tablet 10 mg  10 mg Oral Daily Calvert Cantorizwan, Saima, MD   10 mg at 10/21/17 [id number]     Discharge Medications: Please see discharge summary for a list of discharge medications.  Relevant Imaging Results:  Relevant Lab Results:   Additional Information SSN: [id number]  Clearance CootsNicole A Dalores Weger, LCSW

## 2017-10-21 NOTE — Care Management CC44 (Signed)
Condition Code 44 Documentation Completed  Patient Details  Name: Lorenza Evangelistvelyn Areola MRN: [id number] Date of Birth: 1925-06-17   Condition Code 44 given:  Yes Patient signature on Condition Code 44 notice:  Yes Documentation of 2 MD's agreement:  Yes Code 44 added to claim:  Yes    Alexis Goodelleele, Croix Presley K, RN 10/21/2017, 12:11 PM

## 2017-10-21 NOTE — Care Management Obs Status (Signed)
MEDICARE OBSERVATION STATUS NOTIFICATION   Patient Details  Name: Paula Bird MRN: [id number] Date of Birth: 09-13-1925   Medicare Observation Status Notification Given:       Alexis Goodelleele, Mychael Soots K, RN 10/21/2017, 12:10 PM

## 2017-10-21 NOTE — Progress Notes (Signed)
   Subjective:    Patient reports pain as mild.    Objective: Vital signs in last 24 hours: Temp:  [97.8 F (36.6 C)-99 F (37.2 C)] 98.6 F (37 C) (07/29 0607) Pulse Rate:  [44-56] 50 (07/29 0630) Resp:  [12-16] 13 (07/29 0607) BP: (104-115)/(46) 108/46 (07/29 0607) SpO2:  [95 %-99 %] 95 % (07/29 0607)  Intake/Output from previous day: 07/28 0701 - 07/29 0700 In: 120 [P.O.:120] Out: 100 [Emesis/NG output:100] Intake/Output this shift: No intake/output data recorded.  Recent Labs    10/19/17 1126  HGB 12.4   Recent Labs    10/19/17 1126  WBC 10.3  RBC 4.56  HCT 39.3  PLT 199   Recent Labs    10/19/17 1126 10/20/17 0451  NA 137 137  K 4.6 4.5  CL 104 105  CO2 23 26  BUN 39* 32*  CREATININE 0.73 0.67  GLUCOSE 131* 108*  CALCIUM 9.5 8.9   No results for input(s): LABPT, INR in the last 72 hours.  Neurologically intact No results found.  Assessment/Plan:    Up with therapy, SNF. Office 2 wks followup with Dr. Sabino SnipesYates  Jessicaann Overbaugh C Torrin Frein 10/21/2017, 8:45 AM

## 2017-10-21 NOTE — Evaluation (Signed)
Occupational Therapy Evaluation Patient Details Name: Paula Bird MRN: [id number] DOB: 22-Sep-1925 Today's Date: 10/21/2017    History of Present Illness 84 year old female with history of chronic kidney disease, COPD, myasthenia gravis was up took 3 steps outside her porch and then tripped and fell resulting in Left medial ankle sprain and small avulsion fx  and right nondisplaced lateral malleolar fracture, closed.   Clinical Impression   This 84 yo female admitted with above presents to acute OT with decreased mobility, increased pain all affecting her safety and independence with basic ADLs as she was Mod I pta with all basic ADLs at a sponge bath level and S for shower transfers. She will benefit from acute OT with follow up OT at SNF to get back to PLOF.     Follow Up Recommendations  SNF;Supervision/Assistance - 24 hour    Equipment Recommendations  Other (comment)(TDB at next venue)       Precautions / Restrictions Precautions Precautions: Fall Required Braces or Orthoses: Other Brace/Splint Other Brace/Splint: cam boot on RLE, ASO LLE Restrictions Weight Bearing Restrictions: Yes RLE Weight Bearing: Partial weight bearing RLE Partial Weight Bearing Percentage or Pounds: 50% LLE Weight Bearing: Weight bearing as tolerated      Mobility Bed Mobility Overal bed mobility: Needs Assistance Bed Mobility: Supine to Sit;Sit to Supine     Supine to sit: Min assist Sit to supine: Mod assist      Transfers    pt was Mod A to laterally scoot up towards [facility]                  Balance Overall balance assessment: Needs assistance Sitting-balance support: No upper extremity supported;Feet supported Sitting balance-Leahy Scale: Fair                                     ADL either performed or assessed with clinical judgement   ADL Overall ADL's : Needs assistance/impaired Eating/Feeding: Independent;Sitting   Grooming: Set up;Sitting   Upper  Body Bathing: Set up;Sitting   Lower Body Bathing: Maximal assistance;Bed level   Upper Body Dressing : Set up;Sitting   Lower Body Dressing: Total assistance;Bed level   Toilet Transfer: Maximal assistance;+2 for physical assistance Toilet Transfer Details (indicate cue type and reason): lateral transfer Toileting- Clothing Manipulation and Hygiene: Total assistance               Vision Patient Visual Report: No change from baseline              Pertinent Vitals/Pain Pain Assessment: 0-10 Faces Pain Scale: Hurts even more Pain Location: bil ankles (R>L) Pain Descriptors / Indicators: Sore;Discomfort;Grimacing;Guarding;Aching Pain Intervention(s): Limited activity within patient's tolerance;Monitored during session;Repositioned;Premedicated before session     Hand Dominance Right   Extremity/Trunk Assessment Upper Extremity Assessment Upper Extremity Assessment: Overall WFL for tasks assessed           Communication Communication Communication: No difficulties   Cognition Arousal/Alertness: Awake/alert Behavior During Therapy: WFL for tasks assessed/performed Overall Cognitive Status: Within Functional Limits for tasks assessed                                                Home Living Family/patient expects to be discharged to:: Skilled nursing facility  Home Equipment: Walker - 4 wheels;Wheelchair - manual          Prior Functioning/Environment Level of Independence: Independent with assistive device(s)        Comments: pt stated she performs her own ADLs. Uses rollator for ambulation, amb to dining room for meals. Only has A for meds and getting into and out of shower        OT Problem List: Decreased range of motion;Impaired balance (sitting and/or standing);Pain      OT Treatment/Interventions: Self-care/ADL training;Balance training;DME and/or AE instruction;Patient/family  education    OT Goals(Current goals can be found in the care plan section) Acute Rehab OT Goals Patient Stated Goal: to get back to walking OT Goal Formulation: With patient Time For Goal Achievement: 11/04/17 Potential to Achieve Goals: Good  OT Frequency: Min 2X/week   Barriers to D/C: Decreased caregiver support             AM-PAC PT "6 Clicks" Daily Activity     Outcome Measure Help from another person eating meals?: None Help from another person taking care of personal grooming?: A Little Help from another person toileting, which includes using toliet, bedpan, or urinal?: A Lot Help from another person bathing (including washing, rinsing, drying)?: A Lot Help from another person to put on and taking off regular upper body clothing?: A Little Help from another person to put on and taking off regular lower body clothing?: Total 6 Click Score: 15   End of Session    Activity Tolerance: Patient tolerated treatment well Patient left: in bed;with call bell/phone within reach;with bed alarm set  OT Visit Diagnosis: Other abnormalities of gait and mobility (R26.89);Repeated falls (R29.6);History of falling (Z91.81);Pain Pain - Right/Left: (both) Pain - part of body: Ankle and joints of foot                Time: [id number] OT Time Calculation (min): 16 min Charges:  OT General Charges $OT Visit: 1 Visit OT Evaluation $OT Eval Moderate Complexity: [address] Mod  Cathy Tabetha [place], [place] [phone] 10/21/2017

## 2017-10-21 NOTE — Progress Notes (Signed)
Physical Therapy Treatment Patient Details Name: Paula Bird MRN: [id number] DOB: 1925-11-26 Today's Date: 10/21/2017    History of Present Illness 84 year old female with history of chronic kidney disease, COPD, myasthenia gravis was up took 3 steps outside her porch and then tripped and fell resulting in Left medial ankle sprain and small avulsion fx  and right nondisplaced lateral malleolar fracture, closed.    PT Comments    Limited by pain, and fatigue; pt very frustrated with her current medical and ongoing medical issues; will advise RN pf pt stating she wants to die;  Follow Up Recommendations  SNF     Equipment Recommendations  None recommended by PT    Recommendations for Other Services       Precautions / Restrictions Precautions Precautions: Fall Required Braces or Orthoses: Other Brace/Splint Other Brace/Splint: cam boot on RLE, ASO LLE Restrictions Weight Bearing Restrictions: Yes RLE Weight Bearing: Partial weight bearing RLE Partial Weight Bearing Percentage or Pounds: 50% LLE Weight Bearing: Weight bearing as tolerated    Mobility  Bed Mobility Overal bed mobility: Needs Assistance Bed Mobility: Supine to Sit     Supine to sit: Mod assist Sit to supine: Mod assist   General bed mobility comments: incr time, effortful transition to sit; cues for sequencing; assist with trunk and RLE  Transfers Overall transfer level: Needs assistance   Transfers: Lateral/Scoot Transfers          Lateral/Scoot Transfers: +2 safety/equipment;+2 physical assistance;Max assist General transfer comment: assist to scoot, bed used to assist; pt attempts to self assist but difficulty d/t pain  Ambulation/Gait                 Stairs             Wheelchair Mobility    Modified Rankin (Stroke Patients Only)       Balance Overall balance assessment: Needs assistance Sitting-balance support: No upper extremity supported;Feet supported Sitting  balance-Leahy Scale: Fair                                      Cognition Arousal/Alertness: Awake/alert Behavior During Therapy: WFL for tasks assessed/performed Overall Cognitive Status: Within Functional Limits for tasks assessed                                 General Comments: pt expresses much frustration with medical issues, reports she doesn't want to live like this      Exercises      General Comments        Pertinent Vitals/Pain Pain Assessment: 0-10 Pain Score: 8  Faces Pain Scale: Hurts even more Pain Location: bil ankles (R>L) Pain Descriptors / Indicators: Sore;Discomfort;Grimacing;Guarding;Aching Pain Intervention(s): Limited activity within patient's tolerance;Monitored during session;Premedicated before session;Repositioned    Home Living Family/patient expects to be discharged to:: Skilled nursing facility             Home Equipment: Dan Humphreys - 4 wheels;Wheelchair - manual      Prior Function Level of Independence: Independent with assistive device(s)      Comments: pt stated she performs her own ADLs. Uses rollator for ambulation, amb to dining room for meals. Only has A for meds and getting into and out of shower   PT Goals (current goals can now be found in the care plan section) Acute Rehab PT Goals  Patient Stated Goal: to get back to walking PT Goal Formulation: With patient Time For Goal Achievement: 11/03/17 Potential to Achieve Goals: Good Progress towards PT goals: Progressing toward goals    Frequency    Min 3X/week      PT Plan Current plan remains appropriate    Co-evaluation              AM-PAC PT "6 Clicks" Daily Activity  Outcome Measure  Difficulty turning over in bed (including adjusting bedclothes, sheets and blankets)?: Unable Difficulty moving from lying on back to sitting on the side of the bed? : Unable Difficulty sitting down on and standing up from a chair with arms (e.g.,  wheelchair, bedside commode, etc,.)?: Unable Help needed moving to and from a bed to chair (including a wheelchair)?: Total Help needed walking in hospital room?: Total Help needed climbing 3-5 steps with a railing? : Total 6 Click Score: 6    End of Session Equipment Utilized During Treatment: Gait belt Activity Tolerance: Patient limited by pain Patient left: in chair;with call bell/phone within reach;with chair alarm set Nurse Communication: Mobility status PT Visit Diagnosis: Muscle weakness (generalized) (M62.81);Repeated falls (R29.6);Unsteadiness on feet (R26.81);Other abnormalities of gait and mobility (R26.89)     Time: [id number] PT Time Calculation (min) (ACUTE ONLY): 42 min  Charges:  $Therapeutic Activity: 38-52 mins                     Drucilla Chaletara Maddux, PT Pager: [phone] 10/21/2017    [facility] CenterWILLIAMS,Paula Bird 10/21/2017, 11:51 AM

## 2017-10-21 NOTE — Plan of Care (Signed)
  Problem: Education: Goal: Knowledge of General Education information will improve Description: Including pain rating scale, medication(s)/side effects and non-pharmacologic comfort measures Outcome: Progressing   Problem: Health Behavior/Discharge Planning: Goal: Ability to manage health-related needs will improve Outcome: Progressing   Problem: Activity: Goal: Risk for activity intolerance will decrease Outcome: Progressing   

## 2017-10-22 ENCOUNTER — Ambulatory Visit: Payer: Medicare Other | Admitting: Neurology

## 2017-10-22 DIAGNOSIS — S82831D Other fracture of upper and lower end of right fibula, subsequent encounter for closed fracture with routine healing: Secondary | ICD-10-CM | POA: Diagnosis not present

## 2017-10-22 DIAGNOSIS — I10 Essential (primary) hypertension: Secondary | ICD-10-CM | POA: Diagnosis not present

## 2017-10-22 DIAGNOSIS — S8252XD Displaced fracture of medial malleolus of left tibia, subsequent encounter for closed fracture with routine healing: Secondary | ICD-10-CM | POA: Diagnosis not present

## 2017-10-22 MED ORDER — HYDROCODONE-ACETAMINOPHEN 5-325 MG PO TABS: 1.0000 | ORAL_TABLET | Freq: Four times a day (QID) | ORAL | 0 refills | Status: AC | PRN

## 2017-10-22 MED ORDER — POLYETHYLENE GLYCOL 3350 17 G PO PACK: 17.0000 g | PACK | Freq: Every day | ORAL | 12 refills | Status: AC

## 2017-10-22 MED ORDER — BISACODYL 5 MG PO TBEC: 5.0000 mg | DELAYED_RELEASE_TABLET | Freq: Every day | ORAL | 1 refills | Status: DC | PRN

## 2017-10-22 NOTE — Clinical Social Work Placement (Signed)
   CLINICAL SOCIAL WORK PLACEMENT  NOTE  Date:  10/22/2017  Patient Details  Name: Paula Bird MRN: [id number] Date of Birth: [birth year]  Clinical Social Work is seeking post-discharge placement for this patient at the Skilled  Nursing Facility level of care (*CSW will initial, date and re-position this form in  chart as items are completed):  Yes   Patient/family provided with [facility] Clinical Social Work Department's list of facilities offering this level of care within the geographic area requested by the patient (or if unable, by the patient's family).  Yes   Patient/family informed of their freedom to choose among providers that offer the needed level of care, that participate in Medicare, Medicaid or managed care program needed by the patient, have an available bed and are willing to accept the patient.  Yes   Patient/family informed of [facility] interest [facility], as well as of the fact that they are under no obligation to receive care at these facilities.  PASRR submitted to EDS on       PASRR number received on       Existing PASRR number confirmed on       FL2 transmitted to all facilities in geographic area requested by pt/family on       FL2 transmitted to all facilities within larger geographic area on 10/22/17     Patient informed that his/her managed care company has contracts with or will negotiate with certain facilities, including the following:  [facility]     Yes   Patient/family informed of bed offers received.  [facility]     Physician recommends and patient chooses bed at      [facility]  Patient to be transferred to facility by [facility]     Patient family notified on 10/22/17 of transfer.  Name of family member notified:  Son-Guy     PHYSICIAN Please prepare priority discharge summary, including medications     Additional Comment:     _______________________________________________ Clearance CootsNicole A Inayah Woodin, LCSW 10/22/2017, 11:00 AM

## 2017-10-22 NOTE — Progress Notes (Signed)
Facility called 2 times and a message was left for call back. AVS was explained and given to family.

## 2017-10-22 NOTE — Discharge Summary (Signed)
Physician Discharge Summary  Paula Bird  ZOX:[id number]  DOB: Dec 25, 1925  DOA: 10/19/2017 PCP: Angela Cox, MD  Admit date: 10/19/2017 Discharge date: 10/22/2017  Admitted From: ALF  Disposition: SNF   Recommendations for Outpatient Follow-up:  1. Follow up with SNF provider at earliest convenience 2. Please obtain BMP in one week to monitor renal function 3. Follow-up with Dr. Ophelia Charter in 2 weeks 4. Full weightbearing on left ankle, 50% weightbearing on the right.  5. Keep boot cam for 1 week then replace with cast.  Discharge Condition: Stable CODE STATUS: Full code Diet recommendation: Heart Healthy  Brief/Interim Summary: For full details see H&P/Progress note, but in brief, Paula Bird is a 84 year old female with history of chronic kidney disease, COPD, myasthenia gravis, PAF not on anticoagulation who presented to the emergency department after sustaining a mechanical fall while walking outside her porch.  Upon ED evaluation was found to have bilateral ankle injuries, left medial ankle sprain and right nondisplaced lateral malleolar fracture.  Patient was admitted for orthopedic evaluation and pain control.  Orthopedic surgeon placed the patient on cam boot and recommended nonoperative management.  Patient was evaluated by PT and was recommended to go to short-term rehab at [facility].  Patient was deemed stable for discharge.  Subjective: Patient seen and examined, she was complaining of constipation which has resolved this morning.  Pain is well controlled.  Up with PT.  No acute events overnight.  Discharge Diagnoses/Hospital Course:  Left medial ankle sprain with avulsion of deltoid ligament Right ankle nondisplaced fibular fracture Orthopedic surgery was consulted and recommended cam boot x1 week and subsequently placed on cast after swelling is decreased.  She can be full weightbearing on the left ankle and 50% weightbearing on the right. No need for operative  fixation at this time. Pain control as needed. Follow up with Dr. Ophelia Charter in 2 weeks.   Hypertension BP remains stable, on diltiazem and lisinopril   Myasthenia gravis Stable, on CellCept   PAF Remains in NSR, patient not on anticoagulation  COPD Stable, no wheezing.  Continue home inhalers  Chronic constipation Will discharge on Senokot and MiraLAX scheduled and bisacodyl PRN, as patient will be on opiate medications for pain control.  All other chronic medical condition were stable during the hospitalization.  Patient was seen by physical therapy, recommending SNF On the day of the discharge the patient's vitals were stable, and no other acute medical condition were reported by patient. the patient was felt safe to be discharge to SNF  Discharge Instructions  You were cared for by a hospitalist during your hospital stay. If you have any questions about your discharge medications or the care you received while you were in the hospital after you are discharged, you can call the unit and asked to speak with the hospitalist on call if the hospitalist that took care of you is not available. Once you are discharged, your primary care physician will handle any further medical issues. Please note that NO REFILLS for any discharge medications will be authorized once you are discharged, as it is imperative that you return to your primary care physician (or establish a relationship with a primary care physician if you do not have one) for your aftercare needs so that they can reassess your need for medications and monitor your lab values.  Discharge Instructions    Call MD for:  difficulty breathing, headache or visual disturbances   Complete by:  As directed    Call MD for:  extreme fatigue   Complete by:  As directed    Call MD for:  hives   Complete by:  As directed    Call MD for:  persistant dizziness or light-headedness   Complete by:  As directed    Call MD for:  persistant nausea  and vomiting   Complete by:  As directed    Call MD for:  redness, tenderness, or signs of infection (pain, swelling, redness, odor or green/yellow discharge around incision site)   Complete by:  As directed    Call MD for:  severe uncontrolled pain   Complete by:  As directed    Call MD for:  temperature >100.4   Complete by:  As directed    Diet - low sodium heart healthy   Complete by:  As directed    Increase activity slowly   Complete by:  As directed      Allergies as of 10/22/2017   No Known Allergies     Medication List    STOP taking these medications   loperamide 2 MG tablet Commonly known as:  IMODIUM A-D   OVER THE COUNTER MEDICATION   pantoprazole 40 MG tablet Commonly known as:  PROTONIX     TAKE these medications   amiodarone 200 MG tablet Commonly known as:  PACERONE Take 200 mg by mouth daily.   amitriptyline 50 MG tablet Commonly known as:  ELAVIL Take 50 mg by mouth at bedtime.   aspirin EC 81 MG tablet Take 81 mg by mouth daily.   bisacodyl 10 MG suppository Commonly known as:  DULCOLAX Place 10 mg rectally daily as needed for moderate constipation. What changed:  Another medication with the same name was added. Make sure you understand how and when to take each.   bisacodyl 5 MG EC tablet Commonly known as:  DULCOLAX Take 1 tablet (5 mg total) by mouth daily as needed for moderate constipation. What changed:  You were already taking a medication with the same name, and this prescription was added. Make sure you understand how and when to take each.   BREO ELLIPTA 100-25 MCG/INH Aepb Generic drug:  fluticasone furoate-vilanterol Take 1 puff by mouth daily.   calcium-vitamin D 500-200 MG-UNIT tablet Commonly known as:  OSCAL WITH D Take 1 tablet by mouth 2 (two) times daily.   Cranberry 425 MG Caps Take 425 mg by mouth 2 (two) times daily.   cyclobenzaprine 5 MG tablet Commonly known as:  FLEXERIL Take 1 tablet (5 mg total) by mouth 3  (three) times daily as needed for muscle spasms.   diltiazem 300 MG 24 hr tablet Commonly known as:  CARDIZEM LA Take 300 mg by mouth daily.   esomeprazole 40 MG packet Commonly known as:  NEXIUM Take 40 mg by mouth daily before breakfast.   HYDROcodone-acetaminophen 5-325 MG tablet Commonly known as:  NORCO/VICODIN Take 1-2 tablets by mouth every 6 (six) hours as needed for up to 3 days for moderate pain. For pain What changed:  how much to take   INCRUSE ELLIPTA 62.5 MCG/INH Aepb Generic drug:  umeclidinium bromide Take 1 puff by mouth daily.   lisinopril 40 MG tablet Commonly known as:  PRINIVIL,ZESTRIL Take 40 mg by mouth daily.   methimazole 5 MG tablet Commonly known as:  TAPAZOLE Take 5 mg by mouth daily.   mycophenolate 500 MG tablet Commonly known as:  CELLCEPT Take 1 tablet (500 mg total) by mouth 2 (two) times daily.  ondansetron 4 MG tablet Commonly known as:  ZOFRAN Take 1 tablet (4 mg total) by mouth every 8 (eight) hours as needed for nausea or vomiting.   polyethylene glycol packet Commonly known as:  MIRALAX / GLYCOLAX Take 17 g by mouth daily. What changed:  when to take this   promethazine 25 MG tablet Commonly known as:  PHENERGAN Take 25 mg by mouth every 6 (six) hours as needed. for GERD   sennosides-docusate sodium 8.6-50 MG tablet Commonly known as:  SENOKOT-S Take 1 tablet by mouth 2 (two) times daily.   spironolactone 25 MG tablet Commonly known as:  ALDACTONE Take 25 mg by mouth daily.   sucralfate 1 GM/10ML suspension Commonly known as:  CARAFATE Take 10 mLs (1 g total) by mouth 4 (four) times daily -  with meals and at bedtime.   SUMAtriptan 25 MG tablet Commonly known as:  IMITREX Take 50 mg by mouth daily as needed for migraine. May repeat in 2 hours if headache persists or recurs.   TRINTELLIX 10 MG Tabs tablet Generic drug:  vortioxetine HBr Take 10 mg by mouth daily.   zolpidem 5 MG tablet Commonly known as:   AMBIEN 5 mg at bedtime.       Contact information for follow-up providers    Eldred Manges, MD Follow up in 2 week(s).   Specialty:  Orthopedic Surgery Contact information: [address] [place] [address] [phone]            Contact information for after-discharge care    Destination    [facility] .   Service:  Skilled Nursing Contact information: [address] [place] [address] [phone]                 No Known Allergies  Consultations: Orthopedic surgery   Procedures/Studies: Dg Pelvis 1-2 Views  Result Date: 10/19/2017 CLINICAL DATA:  Fall yesterday with hip pain, initial encounter EXAM: PELVIS - 1-2 VIEW COMPARISON:  None. FINDINGS: Pelvic ring is intact. There are changes consistent with prior superior and inferior pubic ramus fractures on the right. Postsurgical changes in the left hip are noted. Significant degenerative change in remodeling in the right hip joint are seen. IMPRESSION: Chronic appearing changes as described. The right hip is incompletely evaluated on this exam. If there is clinical suspicion for fracture dedicated films are recommended. Electronically Signed   By: Alcide Clever M.D.   On: 10/19/2017 10:40   Dg Ankle Complete Left  Result Date: 10/19/2017 CLINICAL DATA:  Bilateral ankle and foot pain. EXAM: LEFT ANKLE COMPLETE - 3+ VIEW COMPARISON:  None. FINDINGS: Osteopenia. Avulsion fracture of the medial malleolus with associated soft tissue swelling . Ankle mortise articulations are preserved. Vascular calcifications noted within the soft tissues. IMPRESSION: Avulsion fracture of the medial malleolus with associated soft tissue swelling. Electronically Signed   By: Ted Mcalpine M.D.   On: 10/19/2017 09:51   Dg Ankle Complete Right  Result Date: 10/19/2017 CLINICAL DATA:  Recent fall with ankle pain and swelling, initial encounter EXAM: RIGHT ANKLE - COMPLETE 3+ VIEW  COMPARISON:  None. FINDINGS: Oblique fracture through the distal fibular metaphysis is identified. Associated soft tissue swelling is noted. No other fracture or dislocation is seen. Vascular calcifications are noted. IMPRESSION: Distal fibular fracture with associated soft tissue changes. Electronically Signed   By: Alcide Clever M.D.   On: 10/19/2017 09:51   Dg Foot Complete Left  Result Date: 10/19/2017 CLINICAL DATA:  Bilateral feet and ankles pain post fall. EXAM: LEFT FOOT - COMPLETE 3+ VIEW COMPARISON:  None. FINDINGS: Osteopenia. There is a transverse nondisplaced fracture of the fifth metatarsal head. Avulsion fracture of the medial malleolus is also noted. Chronic hallux valgus deformity of the first ray. Mild soft tissue swelling. IMPRESSION: Transverse nondisplaced fracture of the left fifth metatarsal head. Avulsion fracture of the left medial malleolus. Osteopenia. Electronically Signed   By: Ted Mcalpineobrinka  Dimitrova M.D.   On: 10/19/2017 09:54   Dg Foot Complete Right  Result Date: 10/19/2017 CLINICAL DATA:  Fall yesterday with right ankle pain and swelling, initial encounter EXAM: RIGHT FOOT COMPLETE - 3+ VIEW COMPARISON:  None. FINDINGS: Distal fibular fracture is again identified. Healed fractures of the second and third metatarsals are seen. Hallux valgus deformity is noted. No other fracture is noted. Degenerative changes of the tarsal bones are noted. IMPRESSION: Fibular fracture is again noted. No acute abnormality in the foot is seen. Electronically Signed   By: Alcide CleverMark  Lukens M.D.   On: 10/19/2017 09:56   Dg Hip Unilat With Pelvis 2-3 Views Right  Result Date: 10/19/2017 CLINICAL DATA:  Acute RIGHT hip pain following fall yesterday. Initial encounter. EXAM: DG HIP (WITH OR WITHOUT PELVIS) 2-3V RIGHT COMPARISON:  11/13/2015 radiographs FINDINGS: No acute fracture or dislocation. Moderate joint space narrowing and osteophytosis noted. Remote fractures of the RIGHT SUPERIOR and INFERIOR  pubic rami again identified. IMPRESSION: 1. No acute bony abnormality 2. Moderate RIGHT hip degenerative changes. Electronically Signed   By: Harmon PierJeffrey  Hu M.D.   On: 10/19/2017 11:52     Discharge Exam: Vitals:   10/22/17 0611 10/22/17 1114  BP: (!) 104/48 (!) 115/42  Pulse: (!) 55   Resp: 16   Temp: 98.2 F (36.8 C)   SpO2: 97%    Vitals:   10/21/17 1344 10/21/17 2230 10/22/17 0611 10/22/17 1114  BP: (!) 113/53 (!) 102/56 (!) 104/48 (!) 115/42  Pulse: 62 64 (!) 55   Resp: 19 17 16    Temp: 98.3 F (36.8 C) 97.6 F (36.4 C) 98.2 F (36.8 C)   TempSrc: Oral Oral Oral   SpO2: 99% 99% 97%   Weight:        General: NAD Cardiovascular: RRR, S1/S2 +, no rubs, no gallops Respiratory: CTA bilaterally, no wheezing, no rhonchi Abdominal: Soft, NT, ND, bowel sounds + Extremities: Boot can in place on the right LE, ankle brace on the left   The results of significant diagnostics from this hospitalization (including imaging, microbiology, ancillary and laboratory) are listed below for reference.     Microbiology: Recent Results (from the past 240 hour(s))  MRSA PCR Screening     Status: None   Collection Time: 10/20/17  5:07 AM  Result Value Ref Range Status   MRSA by PCR NEGATIVE NEGATIVE Final    Comment:        The GeneXpert MRSA Assay (FDA approved for NASAL specimens only), is one component of a comprehensive MRSA colonization surveillance program. It is not intended to diagnose MRSA infection nor to guide or monitor treatment for MRSA infections. Performed at [facility], [address] [place], [place] [id number]      Labs: BNP (last 3 results) Recent Labs    04/22/17 2002  BNP 29.1   Basic Metabolic Panel: Recent Labs  Lab 10/19/17 1126 10/20/17 0451  NA 137 137  K 4.6 4.5  CL 104 105  CO2 23 26  GLUCOSE 131* 108*  BUN  39* 32*  CREATININE 0.73 0.67  CALCIUM 9.5 8.9   Liver Function Tests: Recent Labs  Lab 10/19/17 1126   AST 18  ALT 17  ALKPHOS 59  BILITOT 0.5  PROT 7.7  ALBUMIN 4.0   No results for input(s): LIPASE, AMYLASE in the last 168 hours. No results for input(s): AMMONIA in the last 168 hours. CBC: Recent Labs  Lab 10/19/17 1126  WBC 10.3  HGB 12.4  HCT 39.3  MCV 86.2  PLT 199   Cardiac Enzymes: No results for input(s): CKTOTAL, CKMB, CKMBINDEX, TROPONINI in the last 168 hours. BNP: Invalid input(s): POCBNP CBG: No results for input(s): GLUCAP in the last 168 hours. D-Dimer No results for input(s): DDIMER in the last 72 hours. Hgb A1c No results for input(s): HGBA1C in the last 72 hours. Lipid Profile No results for input(s): CHOL, HDL, LDLCALC, TRIG, CHOLHDL, LDLDIRECT in the last 72 hours. Thyroid function studies No results for input(s): TSH, T4TOTAL, T3FREE, THYROIDAB in the last 72 hours.  Invalid input(s): FREET3 Anemia work up No results for input(s): VITAMINB12, FOLATE, FERRITIN, TIBC, IRON, RETICCTPCT in the last 72 hours. Urinalysis    Component Value Date/Time   COLORURINE YELLOW 07/17/2017 1531   APPEARANCEUR HAZY (A) 07/17/2017 1531   LABSPEC 1.010 07/17/2017 1531   PHURINE 7.0 07/17/2017 1531   GLUCOSEU NEGATIVE 07/17/2017 1531   HGBUR SMALL (A) 07/17/2017 1531   BILIRUBINUR NEGATIVE 07/17/2017 1531   KETONESUR NEGATIVE 07/17/2017 1531   PROTEINUR NEGATIVE 07/17/2017 1531   UROBILINOGEN 0.2 08/13/2014 2256   NITRITE NEGATIVE 07/17/2017 1531   LEUKOCYTESUR MODERATE (A) 07/17/2017 1531   Sepsis Labs Invalid input(s): PROCALCITONIN,  WBC,  LACTICIDVEN Microbiology Recent Results (from the past 240 hour(s))  MRSA PCR Screening     Status: None   Collection Time: 10/20/17  5:07 AM  Result Value Ref Range Status   MRSA by PCR NEGATIVE NEGATIVE Final    Comment:        The GeneXpert MRSA Assay (FDA approved for NASAL specimens only), is one component of a comprehensive MRSA colonization surveillance program. It is not intended to diagnose  MRSA infection nor to guide or monitor treatment for MRSA infections. Performed at [facility], [address] [place], [place] [address]      Time coordinating discharge: 32 minutes  SIGNED:  Latrelle Dodrill, MD  [facility] 10/22/2017, 11:35 AM  Pager please text page via  [email or web address]  Note - This record has been created using [employer]. Chart creation errors have been sought, but may not always have been located. Such creation errors do not reflect on the standard of medical care.

## 2017-10-24 ENCOUNTER — Encounter: Payer: Self-pay | Admitting: Gastroenterology

## 2017-10-24 ENCOUNTER — Encounter

## 2017-10-24 ENCOUNTER — Ambulatory Visit: Payer: Medicare Other | Admitting: Gastroenterology

## 2017-10-24 VITALS — BP 138/62 | HR 82 | Ht 60.0 in | Wt 134.0 lb

## 2017-10-24 DIAGNOSIS — G43A0 Cyclical vomiting, not intractable: Secondary | ICD-10-CM | POA: Diagnosis not present

## 2017-10-24 DIAGNOSIS — R1010 Upper abdominal pain, unspecified: Secondary | ICD-10-CM | POA: Diagnosis not present

## 2017-10-24 DIAGNOSIS — K224 Dyskinesia of esophagus: Secondary | ICD-10-CM

## 2017-10-24 DIAGNOSIS — R159 Full incontinence of feces: Secondary | ICD-10-CM

## 2017-10-24 DIAGNOSIS — K5909 Other constipation: Secondary | ICD-10-CM | POA: Diagnosis not present

## 2017-10-24 DIAGNOSIS — K257 Chronic gastric ulcer without hemorrhage or perforation: Secondary | ICD-10-CM

## 2017-10-24 DIAGNOSIS — R1115 Cyclical vomiting syndrome unrelated to migraine: Secondary | ICD-10-CM

## 2017-10-24 NOTE — Progress Notes (Signed)
[facility] Progress Note  Chief Complaint: Abdominal pain, vomiting, altered bowel habits  Subjective  History:  See 03/2016 office note.  Severe UGI dysmotility This is a 84 year old woman I saw once in January 2018 with long-standing digestive symptoms, myasthenia gravis, and general debility, seen for reevaluation of GI symptoms.  She is accompanied by her daughter today.  Paula Bird reports that she still does not like the food they serve her at assisted living, she is also become increasingly anxious about her multiple medical problems, feeling unwell much of the time, and loneliness.  She has intermittent crampy abdominal pain, she has intermittent vomiting and her bowel habits tend toward constipation.  However, when she is given something for that, her stool might be loose and she has loss of continence. She was admitted to the hospital in April, and at that time had a CT scan of the abdomen because of the symptoms, report below.  She was also seen in consultation by 1 of the [facility] doctors, upper endoscopy on 07/21/2017 removed a small inflammatory appearing gastric polyp, and a 5 mm clean-based gastric ulcer was seen. Sivan had a fall last week and suffered a right ankle fracture.  ROS: Cardiovascular:  no chest pain Respiratory: no dyspnea  The patient's Past Medical, Family and Social History were reviewed and are on file in the EMR.  Objective:  Med list reviewed  Current Outpatient Medications:  .  amiodarone (PACERONE) 200 MG tablet, Take 200 mg by mouth daily., Disp: , Rfl: 0 .  amitriptyline (ELAVIL) 50 MG tablet, Take 50 mg by mouth at bedtime., Disp: , Rfl: 0 .  aspirin EC 81 MG tablet, Take 81 mg by mouth daily., Disp: , Rfl:  .  bisacodyl (DULCOLAX) 10 MG suppository, Place 10 mg rectally daily as needed for moderate constipation., Disp: , Rfl:  .  bisacodyl (DULCOLAX) 5 MG EC tablet, Take 1 tablet (5 mg total) by mouth daily as needed for moderate  constipation., Disp: 30 tablet, Rfl: 1 .  BREO ELLIPTA 100-25 MCG/INH AEPB, Take 1 puff by mouth daily., Disp: , Rfl: 0 .  calcium-vitamin D (OSCAL WITH D) 500-200 MG-UNIT tablet, Take 1 tablet by mouth 2 (two) times daily., Disp: , Rfl:  .  Cranberry 450 MG CAPS, Take 2 tablets by mouth 2 (two) times daily., Disp: , Rfl:  .  Cranberry 475 MG CAPS, Take 2 capsules by mouth daily., Disp: , Rfl:  .  cyclobenzaprine (FLEXERIL) 5 MG tablet, Take 1 tablet (5 mg total) by mouth 3 (three) times daily as needed for muscle spasms., Disp: 30 tablet, Rfl: 0 .  diltiazem (CARDIZEM LA) 300 MG 24 hr tablet, Take 300 mg by mouth daily., Disp: , Rfl: 10 .  esomeprazole (NEXIUM) 40 MG packet, Take 40 mg by mouth daily before breakfast., Disp: , Rfl:  .  HYDROcodone-acetaminophen (NORCO/VICODIN) 5-325 MG tablet, Take 1-2 tablets by mouth every 6 (six) hours as needed for up to 3 days for moderate pain. For pain, Disp: 24 tablet, Rfl: 0 .  INCRUSE ELLIPTA 62.5 MCG/INH AEPB, Take 1 puff by mouth daily., Disp: , Rfl: 0 .  lisinopril (PRINIVIL,ZESTRIL) 40 MG tablet, Take 40 mg by mouth daily., Disp: , Rfl: 10 .  methimazole (TAPAZOLE) 5 MG tablet, Take 5 mg by mouth daily., Disp: , Rfl:  .  mycophenolate (CELLCEPT) 500 MG tablet, Take 1 tablet (500 mg total) by mouth 2 (two) times daily., Disp: 60 tablet, Rfl: 11 .  ondansetron (ZOFRAN) 4 MG tablet, Take 1 tablet (4 mg total) by mouth every 8 (eight) hours as needed for nausea or vomiting., Disp: 5 tablet, Rfl: 0 .  polyethylene glycol (MIRALAX / GLYCOLAX) packet, Take 17 g by mouth daily., Disp: 14 each, Rfl: 12 .  promethazine (PHENERGAN) 25 MG tablet, Take 25 mg by mouth every 6 (six) hours as needed. for GERD, Disp: , Rfl: 0 .  sennosides-docusate sodium (SENOKOT-S) 8.6-50 MG tablet, Take 1 tablet by mouth 2 (two) times daily., Disp: 30 tablet, Rfl: 0 .  spironolactone (ALDACTONE) 25 MG tablet, Take 25 mg by mouth daily., Disp: , Rfl:  .  sucralfate (CARAFATE) 1  GM/10ML suspension, Take 10 mLs (1 g total) by mouth 4 (four) times daily -  with meals and at bedtime., Disp: 420 mL, Rfl: 0 .  SUMAtriptan (IMITREX) 25 MG tablet, Take 25 mg by mouth every 2 (two) hours as needed for migraine. May repeat in 2 hours if headache persists or recurs., Disp: , Rfl:  .  SUMAtriptan (IMITREX) 50 MG tablet, Take 50 mg by mouth at bedtime as needed for migraine. May repeat in 2 hours if headache persists or recurs., Disp: , Rfl:  .  TRINTELLIX 10 MG TABS tablet, Take 10 mg by mouth daily., Disp: , Rfl: 2 .  tuberculin (APLISOL) 5 UNIT/0.1ML injection, Inject 5 Units into the skin once., Disp: , Rfl:  .  zolpidem (AMBIEN) 5 MG tablet, 5 mg at bedtime., Disp: , Rfl: 0   Vital signs in last 24 hrs: Vitals:   10/24/17 1108  BP: 138/62  Pulse: 82    Physical Exam  She is quite anxious appearing, ringing her hands constantly looking about, distressed on the verge of tears.  HEENT: sclera anicteric, oral mucosa moist without lesions  Neck: supple, no thyromegaly, JVD or lymphadenopathy  Cardiac: RRR without murmurs, S1S2 heard, left ankle brace, right orthopedic boot  Pulm: clear to auscultation bilaterally, normal RR and effort noted Abdomen: soft, no tenderness, with active bowel sounds. No guarding or palpable hepatosplenomegaly.  Wheelchair-bound, limiting exam  Recent Labs:  CBC Latest Ref Rng & Units 10/19/2017 07/22/2017 07/20/2017  WBC 4.0 - 10.5 K/uL 10.3 7.6 5.5  Hemoglobin 12.0 - 15.0 g/dL 13.212.4 11.5(L) 11.1(L)  Hematocrit 36.0 - 46.0 % 39.3 37.6 35.8(L)  Platelets 150 - 400 K/uL 199 159 152   CMP Latest Ref Rng & Units 10/20/2017 10/19/2017 07/22/2017  Glucose 70 - 99 mg/dL 440(N108(H) 027(O131(H) 536(U106(H)  BUN 8 - 23 mg/dL 44(I32(H) 34(V39(H) 18  Creatinine 0.44 - 1.00 mg/dL 4.250.67 9.560.73 3.870.51  Sodium 135 - 145 mmol/L 137 137 139  Potassium 3.5 - 5.1 mmol/L 4.5 4.6 3.6  Chloride 98 - 111 mmol/L 105 104 107  CO2 22 - 32 mmol/L 26 23 23   Calcium 8.9 - 10.3 mg/dL 8.9 9.5  5.6(E8.8(L)  Total Protein 6.5 - 8.1 g/dL - 7.7 -  Total Bilirubin 0.3 - 1.2 mg/dL - 0.5 -  Alkaline Phos 38 - 126 U/L - 59 -  AST 15 - 41 U/L - 18 -  ALT 0 - 44 U/L - 17 -   On 05/16/2017: TSH 5.9, free T4 0.53  Radiologic studies:  CTAP April - no acute GI pathology  @ASSESSMENTPLANBEGIN @ Assessment: Encounter Diagnoses  Name Primary?  . Non-intractable cyclical vomiting with nausea Yes  . Upper abdominal pain   . Chronic constipation   . Full incontinence of feces   . Chronic gastric ulcer without hemorrhage  and without perforation   . Esophageal dysmotility    Complex scenario of elderly woman with multiple severe medical issues, debility, and also what appears to be severe situational anxiety.  I think the latter is exacerbating her symptoms.  She is known to have esophageal dysmotility, I suspect she may have a gastric motility problem as well.  I am not planning any endoscopic therapies given her age and condition.  I do not know for certain if any one or combination of her medicines may be exacerbating the symptoms.  When I brought up the possibility of Elavil perhaps contributing to constipation, she and her daughter said she had been on for decades and would not stop it.  She has been accumulating more medicines on her list over time.  This is a very difficult scenario with few options as I can see it.  I would like, if possible, to simplify her medicine regimen.   Any changes in medicine must be undertaken by her primary care team given her age and the need for her to be evaluated in person regularly, which her daughter assures me can be done once a week at her facility. I will forward today's note to them immediately for review.  I do not think we are ever likely to get her bowel habits regular.  I am concerned that the aggressive constipation therapies are leading to diarrhea and incontinence.  The ondansetron may be exacerbating constipation area   Plan: Consider the  following: Make sure her thyroid function has been checked and medicine dose appropriately Discontinue ondansetron and promethazine Treat her anxiety and see what, if any improvement in GI symptoms, especially nausea and vomiting, occur with that. Consider a short trial of low-dose metoclopramide, perhaps one 5 mg tablet once or twice a day to start.  Understand that this may cause neurological side effects including tardive dyskinesia, exacerbate anxiety, but also possibly get her bowels moving it better.  If so, this would require scaling back the bowel regimen.  Her daughter brought up the possibility of a hospice evaluation.  This patient is clearly struggling with multiple issues late in life and I think she would benefit from something like a quality of life consult such as we have available in the inpatient setting.  I do not know what equivalent evaluation is available at the outpatient/assisted living facility, but something like that seems essential for her.  She understands that there are limitations on what can be done for her, she would just like to be relieved with some suffering and have more good days than bad if that is possible.   Total time 45 minutes, over half spent face-to-face with patient in counseling and coordination of care.   Charlie Pitter III

## 2017-10-24 NOTE — Patient Instructions (Signed)
If you are age 82 or older, your body mass index should be between 23-30. Your Body mass index is 26.17 kg/m. If this is out of the aforementioned range listed, please consider follow up with your Primary Care Provider.  If you are age 82 or younger, your body mass index should be between 19-25. Your Body mass index is 26.17 kg/m. If this is out of the aformentioned range listed, please consider follow up with your Primary Care Provider.   Follow up as needed.   It was a pleasure to meet you today!  Dr. Myrtie Neitheranis

## 2017-11-05 ENCOUNTER — Ambulatory Visit (INDEPENDENT_AMBULATORY_CARE_PROVIDER_SITE_OTHER): Payer: Medicare Other | Admitting: Orthopaedic Surgery

## 2017-11-06 ENCOUNTER — Telehealth (INDEPENDENT_AMBULATORY_CARE_PROVIDER_SITE_OTHER): Payer: Self-pay

## 2017-11-06 ENCOUNTER — Encounter (INDEPENDENT_AMBULATORY_CARE_PROVIDER_SITE_OTHER): Payer: Self-pay | Admitting: Orthopaedic Surgery

## 2017-11-06 ENCOUNTER — Ambulatory Visit (INDEPENDENT_AMBULATORY_CARE_PROVIDER_SITE_OTHER): Payer: Medicare Other | Admitting: Orthopaedic Surgery

## 2017-11-06 VITALS — BP 113/57 | HR 64 | Ht 60.0 in | Wt 134.0 lb

## 2017-11-06 DIAGNOSIS — S82891D Other fracture of right lower leg, subsequent encounter for closed fracture with routine healing: Secondary | ICD-10-CM

## 2017-11-06 DIAGNOSIS — S82892D Other fracture of left lower leg, subsequent encounter for closed fracture with routine healing: Secondary | ICD-10-CM

## 2017-11-06 NOTE — Progress Notes (Signed)
Office Visit Note   Patient: Paula Bird           Date of Birth: 1925-11-03           MRN: [id number] Visit Date: 11/06/2017              Requested by: No referring provider defined for this encounter. PCP: Angela Coxasanayaka, Gayani Y, MD   Assessment & Plan: Visit Diagnoses:  1. Closed fracture of left ankle with routine healing, subsequent encounter   2. Closed fracture of right ankle with routine healing, subsequent encounter     Plan: Patient return in 3 weeks we will repeat x-rays left and right ankle on return and hopefully we can transition her out of the cam boot on the right ankle.  She is continuing therapy twice a day.  She was staying in assisted living but until her ambulation improves she will still need skilled nursing facility.  Ankle x-rays on return right and left.  Follow-Up Instructions: Return in about 3 weeks (around 11/27/2017).   Orders:  No orders of the defined types were placed in this encounter.  No orders of the defined types were placed in this encounter.     Procedures: No procedures performed   Clinical Data: No additional findings.   Subjective: Chief Complaint  Patient presents with  . Left Ankle - Fracture  . Right Ankle - Fracture  . Left Foot - Fracture    HPI 84 year old female returns post injury on 10/18/2017 with left ankle medial malleolus avulsion and right nondisplaced lateral malleolar fracture.  She is been in a cam boot on the right leg and an ASO Swede-O on the left ankle.  She is having more tenderness on the right lateral ankle than the left.  Ecchymosis on the left ankle is still present but is improved.  She has been doing therapy with 50% weightbearing using a walker.  Additionally patient has history of myasthenia gravis.  Review of Systems 14 point review of systems updated unchanged from her consultation in the hospital on 10/20/2017.   Objective: Vital Signs: BP (!) 113/57   Pulse 64   Ht 5' (1.524 m)   Wt 134  lb (60.8 kg)   BMI 26.17 kg/m   Physical Exam  Constitutional: She is oriented to person, place, and time. She appears well-developed.  HENT:  Head: Normocephalic.  Right Ear: External ear normal.  Left Ear: External ear normal.  Eyes: Pupils are equal, round, and reactive to light.  Neck: No tracheal deviation present. No thyromegaly present.  Cardiovascular: Normal rate.  Pulmonary/Chest: Effort normal.  Abdominal: Soft.  Neurological: She is alert and oriented to person, place, and time.  Skin: Skin is warm and dry.  Psychiatric: She has a normal mood and affect. Her behavior is normal.    Ortho Exam patient has ecchymosis over her left forefoot lateral foot and medial heel.  Tenderness over the deltoid ligament medially on the left foot.  Right foot shows some periosteal prominence without significant ecchymosis with tenderness over the fracture site of the distal fibula.  Specialty Comments:  No specialty comments available.  Imaging: No results found.   PMFS History: Patient Active Problem List   Diagnosis Date Noted  . Closed fracture of left ankle   . Closed fracture of right ankle   . Fibula fracture 10/19/2017  . Avulsion fracture of medial malleolus of left tibia 10/19/2017  . UTI (urinary tract infection) 07/17/2017  . Infection due to parainfluenza  virus 4 03/06/2017  . Gait abnormality 06/07/2016  . Hyperthyroidism 04/24/2016  . Myasthenia gravis (HCC) 02/07/2016  . Ptosis of eyelid, right 01/31/2016  . Pain, eye, right 01/31/2016  . Falling 01/31/2016  . Vertigo 10/08/2015  . Colon polyp 08/30/2015  . Arthritis, degenerative 08/30/2015  . Acid indigestion 08/30/2015  . Psoriasis 08/30/2015  . Avitaminosis D 08/30/2015  . HCAP (healthcare-associated pneumonia) 08/08/2015  . Respiratory distress 08/08/2015  . Essential hypertension 08/08/2015  . COPD exacerbation (HCC) 08/08/2015  . Dyspnea 08/04/2015  . Hypertension 08/04/2015  . Depression  08/04/2015  . Insomnia 08/04/2015  . Obstructive chronic bronchitis with acute exacerbation (HCC) 08/04/2015  . Thrombocytopenia (HCC) 08/04/2015  . Paroxysmal atrial fibrillation (HCC) 08/04/2015  . Acute exacerbation of chronic obstructive bronchitis (HCC) 08/04/2015  . Infection of urinary tract 07/01/2014  . Bradycardia 11/24/2013  . Fall in home 11/22/2013  . Pulmonary hypertension (HCC) 11/19/2013  . Excessive falling 09/07/2012  . Encounter for general adult medical examination without abnormal findings 03/24/2012  . Atrial paroxysmal tachycardia (HCC) 10/05/2011  . Family history of colon cancer 05/15/2011  . Mass of pelvis 05/15/2011   Past Medical History:  Diagnosis Date  . Acute and chronic respiratory failure, unspecified whether with hypoxia or hypercapnia (HCC)   . Afib (HCC)   . Allergic rhinitis   . Anemia   . CKD (chronic kidney disease)   . Constipation   . COPD (chronic obstructive pulmonary disease) (HCC)   . Depression   . Diplopia   . Dysphagia   . Edema, lower extremity   . Esophageal dysmotility   . Gait instability   . GERD (gastroesophageal reflux disease)   . Hypertension   . Hypothyroidism   . IBS (irritable bowel syndrome)   . IDA (iron deficiency anemia)   . Insomnia   . Leukocytosis   . Myasthenia gravis (HCC)   . Oral thrush   . PAF (paroxysmal atrial fibrillation) (HCC)   . Physical deconditioning   . Pneumonia 02/2017  . Protein calorie malnutrition (HCC)   . Thyroid disease   . Thyroid mass   . Vertigo     Family History  Problem Relation Age of Onset  . Hypertension Mother   . Hypertension Son   . Diabetes Neg Hx   . Thyroid disease Neg Hx     Past Surgical History:  Procedure Laterality Date  . ABDOMINAL HYSTERECTOMY    . APPENDECTOMY    . ESOPHAGOGASTRODUODENOSCOPY (EGD) WITH PROPOFOL N/A 07/21/2017   Procedure: ESOPHAGOGASTRODUODENOSCOPY (EGD) WITH PROPOFOL;  Surgeon: Kerin SalenKarki, Arya, MD;  Location: [facility];  Service:  Gastroenterology;  Laterality: N/A;  . TOTAL HIP ARTHROPLASTY Left   . TOTAL KNEE ARTHROPLASTY Left    Social History   Occupational History  . Occupation: Retired  Tobacco Use  . Smoking status: Never Smoker  . Smokeless tobacco: Never Used  Substance and Sexual Activity  . Alcohol use: No  . Drug use: No  . Sexual activity: Not on file

## 2017-11-06 NOTE — Telephone Encounter (Signed)
Taliegh with [facility] would like an order faxed to increase patient's Gabapentin.  Advised by patient that Gabapentin was to be increased per Dr. Ophelia CharterYates.  Fax# is [phone].  CB# is [phone].  Please advise.  Thank you.

## 2017-11-06 NOTE — Telephone Encounter (Signed)
Did not say increase gabapentin. Has myathenia gravis plus 30 other medical problems. She will be drunk. Was going to call them back but only have 6 of 7 numbers in the message you sent. Got any idea what complete number is so I can call?

## 2017-11-06 NOTE — Telephone Encounter (Signed)
Please advise 

## 2017-11-07 NOTE — Telephone Encounter (Signed)
I tried calling .cannot get thru they do not pick up. I did not start Gabepentin and with all her other multiple meds and her age would not increase it. Sorry. Thanks ucall.

## 2017-11-07 NOTE — Telephone Encounter (Signed)
[phone] I believe

## 2017-11-08 NOTE — Telephone Encounter (Signed)
IC and advised nurse.

## 2017-11-26 ENCOUNTER — Ambulatory Visit (INDEPENDENT_AMBULATORY_CARE_PROVIDER_SITE_OTHER): Payer: Self-pay

## 2017-11-26 ENCOUNTER — Encounter (INDEPENDENT_AMBULATORY_CARE_PROVIDER_SITE_OTHER): Payer: Self-pay | Admitting: Orthopaedic Surgery

## 2017-11-26 ENCOUNTER — Ambulatory Visit (INDEPENDENT_AMBULATORY_CARE_PROVIDER_SITE_OTHER): Payer: Medicare Other | Admitting: Orthopaedic Surgery

## 2017-11-26 VITALS — BP 132/64 | HR 64 | Ht 60.0 in | Wt 134.0 lb

## 2017-11-26 DIAGNOSIS — S82892D Other fracture of left lower leg, subsequent encounter for closed fracture with routine healing: Secondary | ICD-10-CM | POA: Diagnosis not present

## 2017-11-26 DIAGNOSIS — S82891D Other fracture of right lower leg, subsequent encounter for closed fracture with routine healing: Secondary | ICD-10-CM | POA: Diagnosis not present

## 2017-11-26 NOTE — Progress Notes (Signed)
Post-Op Visit Note   Patient: Paula Bird           Date of Birth: [birth year]           MRN: [id number] Visit Date: 11/26/2017 PCP: Angela Cox, MD   Assessment & Plan: 84 year old female returns for transverse right distal fibular fracture now 5 and half weeks out and left foot medial ligamentous avulsion with left fifth metatarsal distal shaft fracture.  She has been using the cam boot on the right and ASO on the left.  X-rays today demonstrate interval healing of the fibular fracture.  Both ankles are in satisfactory position.  Chief Complaint:  Chief Complaint  Patient presents with  . Right Ankle - Fracture, Follow-up  . Left Ankle - Fracture, Follow-up   Visit Diagnoses:  1. Closed fracture of left ankle with routine healing, subsequent encounter   2. Closed fracture of right ankle with routine healing, subsequent encounter     Plan: Patient can transition to her tennis shoes with inserts.  She had old injury with posterior tibial tendon disruption on the left.  If she has some increased pain in the right lateral malleolus she can reapply the cam boot and use it for a week and then retry tennis shoe.  If she has increased symptoms she can return.  Follow-Up Instructions: No follow-ups on file.   Orders:  Orders Placed This Encounter  Procedures  . XR Ankle Complete Left  . XR Ankle Complete Right   No orders of the defined types were placed in this encounter.   Imaging: No results found.  PMFS History: Patient Active Problem List   Diagnosis Date Noted  . Closed fracture of left ankle   . Closed fracture of right ankle   . Fibula fracture 10/19/2017  . Avulsion fracture of medial malleolus of left tibia 10/19/2017  . UTI (urinary tract infection) 07/17/2017  . Infection due to parainfluenza virus 4 03/06/2017  . Gait abnormality 06/07/2016  . Hyperthyroidism 04/24/2016  . Myasthenia gravis (HCC) 02/07/2016  . Ptosis of eyelid, right 01/31/2016    . Pain, eye, right 01/31/2016  . Falling 01/31/2016  . Vertigo 10/08/2015  . Colon polyp 08/30/2015  . Arthritis, degenerative 08/30/2015  . Acid indigestion 08/30/2015  . Psoriasis 08/30/2015  . Avitaminosis D 08/30/2015  . HCAP (healthcare-associated pneumonia) 08/08/2015  . Respiratory distress 08/08/2015  . Essential hypertension 08/08/2015  . COPD exacerbation (HCC) 08/08/2015  . Dyspnea 08/04/2015  . Hypertension 08/04/2015  . Depression 08/04/2015  . Insomnia 08/04/2015  . Obstructive chronic bronchitis with acute exacerbation (HCC) 08/04/2015  . Thrombocytopenia (HCC) 08/04/2015  . Paroxysmal atrial fibrillation (HCC) 08/04/2015  . Acute exacerbation of chronic obstructive bronchitis (HCC) 08/04/2015  . Infection of urinary tract 07/01/2014  . Bradycardia 11/24/2013  . Fall in home 11/22/2013  . Pulmonary hypertension (HCC) 11/19/2013  . Excessive falling 09/07/2012  . Encounter for general adult medical examination without abnormal findings 03/24/2012  . Atrial paroxysmal tachycardia (HCC) 10/05/2011  . Family history of colon cancer 05/15/2011  . Mass of pelvis 05/15/2011   Past Medical History:  Diagnosis Date  . Acute and chronic respiratory failure, unspecified whether with hypoxia or hypercapnia (HCC)   . Afib (HCC)   . Allergic rhinitis   . Anemia   . CKD (chronic kidney disease)   . Constipation   . COPD (chronic obstructive pulmonary disease) (HCC)   . Depression   . Diplopia   . Dysphagia   . Edema,  lower extremity   . Esophageal dysmotility   . Gait instability   . GERD (gastroesophageal reflux disease)   . Hypertension   . Hypothyroidism   . IBS (irritable bowel syndrome)   . IDA (iron deficiency anemia)   . Insomnia   . Leukocytosis   . Myasthenia gravis (HCC)   . Oral thrush   . PAF (paroxysmal atrial fibrillation) (HCC)   . Physical deconditioning   . Pneumonia 02/2017  . Protein calorie malnutrition (HCC)   . Thyroid disease   .  Thyroid mass   . Vertigo     Family History  Problem Relation Age of Onset  . Hypertension Mother   . Hypertension Son   . Diabetes Neg Hx   . Thyroid disease Neg Hx     Past Surgical History:  Procedure Laterality Date  . ABDOMINAL HYSTERECTOMY    . APPENDECTOMY    . ESOPHAGOGASTRODUODENOSCOPY (EGD) WITH PROPOFOL N/A 07/21/2017   Procedure: ESOPHAGOGASTRODUODENOSCOPY (EGD) WITH PROPOFOL;  Surgeon: Kerin Salen, MD;  Location: [facility];  Service: Gastroenterology;  Laterality: N/A;  . TOTAL HIP ARTHROPLASTY Left   . TOTAL KNEE ARTHROPLASTY Left    Social History   Occupational History  . Occupation: Retired  Tobacco Use  . Smoking status: Never Smoker  . Smokeless tobacco: Never Used  Substance and Sexual Activity  . Alcohol use: No  . Drug use: No  . Sexual activity: Not on file

## 2017-11-27 ENCOUNTER — Ambulatory Visit (INDEPENDENT_AMBULATORY_CARE_PROVIDER_SITE_OTHER): Payer: Medicare Other | Admitting: Orthopaedic Surgery

## 2018-01-02 ENCOUNTER — Encounter: Payer: Medicare Other | Admitting: Nurse Practitioner

## 2018-01-03 ENCOUNTER — Encounter: Payer: Medicare Other | Admitting: Nurse Practitioner

## 2018-01-09 ENCOUNTER — Ambulatory Visit: Payer: Medicare Other | Admitting: Neurology

## 2018-01-09 ENCOUNTER — Encounter: Payer: Self-pay | Admitting: Neurology

## 2018-01-09 VITALS — BP 163/81 | HR 71 | Ht 60.0 in | Wt 145.0 lb

## 2018-01-09 DIAGNOSIS — G7000 Myasthenia gravis without (acute) exacerbation: Secondary | ICD-10-CM

## 2018-01-09 MED ORDER — MYCOPHENOLATE MOFETIL 500 MG PO TABS: 500.0000 mg | ORAL_TABLET | Freq: Two times a day (BID) | ORAL | 11 refills | Status: DC

## 2018-01-09 NOTE — Progress Notes (Signed)
[facility]  PATIENT: Paula Bird DOB: [birth year]   REASON FOR VISIT: Follow-up for myasthenia gravis HISTORY FROM: Patient and daughter Paula Bird    HISTORY OF PRESENT ILLNESS:Paula Bird is 84 year old right-handed female, accompanied by her daughter Paula Bird, seen in refer by primary care physician Dr. Merlene Laughter for evaluation of eye pain, right eye swelling, initial evaluation was on January 31 2016.  I reviewed and summarized the referring note, she had a history of hypertension, paroxysmal atrial fibrillation, depression, obsessive-compulsive disorder, acid reflux, polypharmacy treatment, also had a history of left knee, left hip replacement,  She used to lives alone at home, but began to suffer multiple falls around 2015, has moved to [facility] since 2016.  She was noted to have right side droopy eyelid swelling since September 2017, gradually getting worse, light sensitivity, she had chronic headache, tends to stay at the right side, with her new right eye symptoms, she noticed more right behind eye pain, she was seen by optometrist, was given eyedrops of unknown name, with only mild improvement. Is difficulty for her to open her right eye,  She denies rash broke out, she denies hearing loss, no dysarthria, no dysphagia  Laboratory evaluation in September 2017, normal CMP with creatinine 0.61, normal CBC, with hemoglobin of 12.2, TSH was decreased to 0.008 with high free T3 8.3, free thyroxine1.50,  Update February 07 2016: She was diagnosed with seropositive generalized myasthenia gravis, bulbar predominant,  Laboratory evaluation showed positive acetylcholine binding antibody 8.3, blocking antibody 39, anti-striation antibodies 1:160, normal CPK 21, C-reactive protein, ESR, folic acid, vitamin F81, mild elevated free T3 4.Normal free T4, decreased TSH less than 0.01,  She continue have significant right ptosis,  intermittent double vision, denies swallowing difficulty, gait abnormality she attributed to left hip and knee pain,  UPDATE Dec 7th 2017: She was seen by endocrinologist Dr. 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 Einar Gip showed no significant abnormality She is able to tolerate prednisone, on tapering dose, CellCept 500 mg twice a day, Mestinon 3 times a day CT chest showed multiple bilateral lung nodule, no thymus pathology noticed, there was aorta 4 cm aneurysm  UPDATE June 07 2016: Seropositive generalized myasthenia gravis She can open her right eye much better, denied double vision, continue have gait difficulty, but sounds like a chronic problem, reviewed her medication list, she is taking CellCept 500 mg twice a day, no longer taking prednisone, and Mestinon 60 mg 3 times a day,  UPDATE October 16 2016:YY Was able to review laboratory evaluations, normal TSH, free T4 was mildly elevated 0.5, negative ANA, there is no significant change in her myasthenia gravis, she has no double vision, no droopy eyelid, able to tolerate CellCept 500 mg twice a day  Update January 09, 2018: She is here to follow-up myasthenia gravis, continue taking CellCept 500 mg twice a day, Mestinon is no longer on her medication list, due to significant GI side effect, there was no worsening of her myasthenia gravis symptoms after stopping Mestinon, in specific she denies diplopia, no droopy eyelid,  She had bilateral ankle fracture in summer 2019, came in wheelchair today, she continues to complain of GI symptoms, diarrhea now with constipation, insomnia,   She also complains of worsening anxiety , depression, feel anxious, shortness of breath, bilateral eye itching,  REVIEW OF SYSTEMS: Full 14  system review of systems performed and notable only for  those listed, all others are neg:  As above    ALLERGIES: No Known Allergies  HOME MEDICATIONS: Outpatient Medications Prior to Visit  Medication Sig Dispense Refill  . albuterol (PROVENTIL HFA;VENTOLIN HFA) 108 (90 Base) MCG/ACT inhaler Inhale 2 puffs into the lungs every 6 (six) hours as needed for wheezing or shortness of breath.    Marland Kitchen amiodarone (PACERONE) 200 MG tablet Take 200 mg by mouth daily.  0  . amitriptyline (ELAVIL) 50 MG tablet Take 50 mg by mouth at bedtime.  0  . aspirin EC 81 MG tablet Take 81 mg by mouth daily.    . bisacodyl (DULCOLAX) 10 MG suppository Place 10 mg rectally daily as needed for moderate constipation.    . bisacodyl (DULCOLAX) 5 MG EC tablet Take 1 tablet (5 mg total) by mouth daily as needed for moderate constipation. 30 tablet 1  . BREO ELLIPTA 100-25 MCG/INH AEPB Take 1 puff by mouth daily.  0  . calcium-vitamin D (OSCAL WITH D) 500-200 MG-UNIT tablet Take 1 tablet by mouth 2 (two) times daily.    . citalopram (CELEXA) 10 MG tablet Take 10 mg by mouth daily.    . Cranberry 450 MG CAPS Take 2 tablets by mouth 2 (two) times daily.    . cyclobenzaprine (FLEXERIL) 5 MG tablet Take 1 tablet (5 mg total) by mouth 3 (three) times daily as needed for muscle spasms. 30 tablet 0  . diltiazem (CARDIZEM LA) 300 MG 24 hr tablet Take 300 mg by mouth daily.  10  . esomeprazole (NEXIUM) 40 MG packet Take 40 mg by mouth daily before breakfast.    . HYDROcodone-acetaminophen (NORCO/VICODIN) 5-325 MG tablet Take 1 tablet by mouth every 6 (six) hours as needed for moderate pain.    . INCRUSE ELLIPTA 62.5 MCG/INH AEPB Take 1 puff by mouth daily.  0  . lisinopril (PRINIVIL,ZESTRIL) 40 MG tablet Take 40 mg by mouth daily.  10  . methimazole (TAPAZOLE) 5 MG tablet Take 5 mg by mouth daily.    . mycophenolate (CELLCEPT) 500 MG tablet Take 1 tablet (500 mg total) by mouth 2 (two) times daily. 60 tablet 11  . ondansetron (ZOFRAN) 4 MG tablet Take 1 tablet (4 mg total) by mouth  every 8 (eight) hours as needed for nausea or vomiting. 5 tablet 0  . polyethylene glycol (MIRALAX / GLYCOLAX) packet Take 17 g by mouth daily. 14 each 12  . sennosides-docusate sodium (SENOKOT-S) 8.6-50 MG tablet Take 1 tablet by mouth 2 (two) times daily. 30 tablet 0  . spironolactone (ALDACTONE) 25 MG tablet Take 25 mg by mouth daily.    . sucralfate (CARAFATE) 1 GM/10ML suspension Take 10 mLs (1 g total) by mouth 4 (four) times daily -  with meals and at bedtime. 420 mL 0  . SUMAtriptan (IMITREX) 25 MG tablet Take 25 mg by mouth every 2 (two) hours as needed for migraine. May repeat in 2 hours if headache persists or recurs.    . SUMAtriptan (IMITREX) 50 MG tablet Take 50 mg by mouth at bedtime as needed for migraine. May repeat in 2 hours if headache persists or recurs.    . TRAZODONE HCL PO Take 25 mg by mouth 2 (two) times daily as needed (anxiety).    Marland Kitchen zolpidem (AMBIEN) 10 MG tablet Take 10 mg by mouth at bedtime as needed for sleep.    . Cranberry 475 MG CAPS Take 2 capsules  by mouth daily.    . promethazine (PHENERGAN) 25 MG tablet Take 25 mg by mouth every 6 (six) hours as needed. for GERD  0  . TRINTELLIX 10 MG TABS tablet Take 10 mg by mouth daily.  2  . tuberculin (APLISOL) 5 UNIT/0.1ML injection Inject 5 Units into the skin once.    Marland Kitchen zolpidem (AMBIEN) 5 MG tablet 5 mg at bedtime.  0   No facility-administered medications prior to visit.     PAST MEDICAL HISTORY: Past Medical History:  Diagnosis Date  . Acute and chronic respiratory failure, unspecified whether with hypoxia or hypercapnia ([place])   . Afib ([place])   . Allergic rhinitis   . Anemia   . CKD (chronic kidney disease)   . Constipation   . COPD (chronic obstructive pulmonary disease) ([place])   . Depression   . Diplopia   . Dysphagia   . Edema, lower extremity   . Esophageal dysmotility   . Gait instability   . GERD (gastroesophageal reflux disease)   . Hypertension   . Hypothyroidism   . IBS (irritable bowel  syndrome)   . IDA (iron deficiency anemia)   . Insomnia   . Leukocytosis   . Myasthenia gravis ([place])   . Oral thrush   . PAF (paroxysmal atrial fibrillation) ([place])   . Physical deconditioning   . Pneumonia 02/2017  . Protein calorie malnutrition ([place])   . Thyroid disease   . Thyroid mass   . Vertigo     PAST SURGICAL HISTORY: Past Surgical History:  Procedure Laterality Date  . ABDOMINAL HYSTERECTOMY    . APPENDECTOMY    . ESOPHAGOGASTRODUODENOSCOPY (EGD) WITH PROPOFOL N/A 07/21/2017   Procedure: ESOPHAGOGASTRODUODENOSCOPY (EGD) WITH PROPOFOL;  Surgeon: Ronnette Juniper, MD;  Location: [facility];  Service: Gastroenterology;  Laterality: N/A;  . TOTAL HIP ARTHROPLASTY Left   . TOTAL KNEE ARTHROPLASTY Left     FAMILY HISTORY: Family History  Problem Relation Age of Onset  . Hypertension Mother   . Hypertension Son   . Diabetes Neg Hx   . Thyroid disease Neg Hx     SOCIAL HISTORY: Social History   Socioeconomic History  . Marital status: Widowed    Spouse name: Not on file  . Number of children: 2  . Years of education: HS  . Highest education level: Not on file  Occupational History  . Occupation: Retired  Scientific laboratory technician  . Financial resource strain: Not on file  . Food insecurity:    Worry: Not on file    Inability: Not on file  . Transportation needs:    Medical: Not on file    Non-medical: Not on file  Tobacco Use  . Smoking status: Never Smoker  . Smokeless tobacco: Never Used  Substance and Sexual Activity  . Alcohol use: No  . Drug use: No  . Sexual activity: Not on file  Lifestyle  . Physical activity:    Days per week: Not on file    Minutes per session: Not on file  . Stress: Not on file  Relationships  . Social connections:    Talks on phone: Not on file    Gets together: Not on file    Attends religious service: Not on file    Active member of club or organization: Not on file    Attends meetings of clubs or organizations: Not on file     Relationship status: Not on file  . Intimate partner violence:    Fear of  current or ex partner: Not on file    Emotionally abused: Not on file    Physically abused: Not on file    Forced sexual activity: Not on file  Other Topics Concern  . Not on file  Social History Narrative   Resides at [facility].   Left-handed.   2 cups caffeine per day.     PHYSICAL EXAM  Vitals:   01/09/18 1101  BP: (!) 163/81  Pulse: 71  Weight: 145 lb (65.8 kg)  Height: 5' (1.524 m)   Body mass index is 28.32 kg/m.  Generalized: Well developed, in no acute distress  Head: normocephalic and atraumatic,. Oropharynx benign  Neck: Supple,  Musculoskeletal: No deformity   Neurological examination   Mentation: Alert oriented to time, place, history taking. Attention span and concentration appropriate. Recent and remote memory intact.  Follows all commands speech and language fluent.   Cranial nerve II-XII: Pupils were equal round reactive to light extraocular movements were full, visual field were full on confrontational test. No ptosis Facial sensation and strength were normal. hearing was intact to finger rubbing bilaterally. Uvula tongue midline. head turning and shoulder shrug were normal and symmetric.Tongue protrusion into cheek strength was normal. Motor: normal bulk and tone, full strength in the BUE, bilateral hip flexion weakness  Sensory: normal and symmetric to light touch, pinprick, and  Vibration, in the upper and lower extremities Coordination: finger-nose-finger, heel-to-shin bilaterally, no dysmetria Reflexes: Brachioradialis 2/2, biceps 2/2, triceps 2/2, patellar 2/2, Achilles 2/2, plantar responses were flexor bilaterally. Gait and Station: She needs assistance to get up from seated position, cautious unsteady gait  DIAGNOSTIC DATA (LABS, IMAGING, TESTING) - I reviewed patient records, labs, notes, testing and imaging myself where available.  Lab Results  Component  Value Date   WBC 10.3 10/19/2017   HGB 12.4 10/19/2017   HCT 39.3 10/19/2017   MCV 86.2 10/19/2017   PLT 199 10/19/2017      Component Value Date/Time   NA 137 10/20/2017 0451   NA 141 10/16/2016 1429   K 4.5 10/20/2017 0451   CL 105 10/20/2017 0451   CO2 26 10/20/2017 0451   GLUCOSE 108 (H) 10/20/2017 0451   BUN 32 (H) 10/20/2017 0451   BUN 15 10/16/2016 1429   CREATININE 0.67 10/20/2017 0451   CALCIUM 8.9 10/20/2017 0451   PROT 7.7 10/19/2017 1126   PROT 6.6 10/16/2016 1429   ALBUMIN 4.0 10/19/2017 1126   ALBUMIN 4.1 10/16/2016 1429   AST 18 10/19/2017 1126   ALT 17 10/19/2017 1126   ALKPHOS 59 10/19/2017 1126   BILITOT 0.5 10/19/2017 1126   BILITOT <0.2 10/16/2016 1429   GFRNONAA >60 10/20/2017 0451   GFRAA >60 10/20/2017 0451    Lab Results  Component Value Date   HGBA1C 6.0 (H) 08/11/2015   Lab Results  Component Value Date   VITAMINB12 374 01/31/2016   Lab Results  Component Value Date   TSH 5.92 (H) 05/16/2017      ASSESSMENT AND PLAN  Shatika Grinnell is a 84 y.o. female   Myasthenia gravis   Positive acetylcholine receptor antibody, symptoms predominantly affecting her eyes, presented with ptosis,  Improved after CellCept 500 mg twice daily  She could not tolerate Mestinon due to GI side effect,  I have reviewed her medication list from assisted living carefully, have suggested her stop taking Flexeril, Zofran, cranberry, vitamin D supplement, Dulcolax, to simplify her medication list.  Face to face time was 25 minutes, greater than  50% of the time was spent in counseling and coordination of care with the patient.     Marcial Pacas. M.D. PhD.  [facility] [address] [place], [place] [address] [phone]

## 2018-01-17 ENCOUNTER — Non-Acute Institutional Stay: Payer: Medicare Other | Admitting: Nurse Practitioner

## 2018-01-17 DIAGNOSIS — G8929 Other chronic pain: Secondary | ICD-10-CM

## 2018-01-17 DIAGNOSIS — R269 Unspecified abnormalities of gait and mobility: Secondary | ICD-10-CM

## 2018-01-17 DIAGNOSIS — R296 Repeated falls: Secondary | ICD-10-CM

## 2018-01-17 DIAGNOSIS — F339 Major depressive disorder, recurrent, unspecified: Secondary | ICD-10-CM

## 2018-01-17 DIAGNOSIS — Z515 Encounter for palliative care: Secondary | ICD-10-CM

## 2018-01-20 ENCOUNTER — Encounter: Payer: Self-pay | Admitting: Nurse Practitioner

## 2018-01-20 NOTE — Progress Notes (Signed)
PALLIATIVE CARE CONSULT VISIT   PATIENT NAME: Paula Bird DOB: 01/25/1926 MRN: [id number]   [facility]  PRIMARY CARE PROVIDER:   Angela Cox, MD  REFERRING PROVIDER:  Angela Cox, MD [phone] [place], [place] [address]  RESPONSIBLE PARTY:  Rolonda Pontarelli (son) [phone]  HISTORY OF PRESENT ILLNESS:  Paula Bird is a 84 y.o. year old female with multiple medical problems including myasthenia gravis, frequent falls, gait disturbance, macular degeneration, depression, IBS, COPD,HTN, hyperthyroidism, PAF. Palliative Care was asked to help with symptom management and to address goals of care.    ASSESSMENT/PLAN/RECOMMENDATIONS      -Myasthenia gravis  -macular degeneration -OV with Dr. 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    I spent 45 minutes providing this consultation,  from 15:00 to 15:45. More than 50% of the time in this consultation was spent coordinating communication.    CODE STATUS: TBD  PPS: 50% HOSPICE ELIGIBILITY/DIAGNOSIS: TBD  PAST MEDICAL  HISTORY:  Past Medical History:  Diagnosis Date  . Acute and chronic respiratory failure, unspecified whether with hypoxia or hypercapnia ([facility])   . Afib ([facility])   . Allergic rhinitis   . Anemia   . CKD (chronic kidney disease)   . Constipation   . COPD (chronic obstructive pulmonary disease) ([facility])   . Depression   . Diplopia   . Dysphagia   . Edema, lower extremity   . Esophageal dysmotility   . Gait instability   . GERD (gastroesophageal reflux disease)   . Hypertension   . Hypothyroidism   . IBS (irritable bowel syndrome)   . IDA (iron deficiency anemia)   . Insomnia   . Leukocytosis   . Myasthenia gravis ([facility])   . Oral thrush   . PAF (paroxysmal atrial fibrillation) ([facility])   . Physical deconditioning   . Pneumonia 02/2017  . Protein calorie malnutrition ([facility])   . Thyroid disease   . Thyroid mass   . Vertigo     SOCIAL HX:  Social History   Tobacco Use  . Smoking status: Never Smoker  . Smokeless tobacco: Never Used  Substance Use Topics  . Alcohol use: No    ALLERGIES: No Known Allergies   PERTINENT MEDICATIONS:  Outpatient Encounter Medications as of 01/17/2018  Medication Sig  . albuterol (PROVENTIL HFA;VENTOLIN HFA) 108 (90 Base) MCG/ACT inhaler Inhale 2 puffs into the lungs every 6 (six) hours as needed for wheezing or shortness of breath.  Marland Kitchen amiodarone (PACERONE) 200 MG tablet Take 200 mg by mouth daily.  Marland Kitchen amitriptyline (ELAVIL) 50 MG tablet Take 50 mg by mouth at bedtime.  Marland Kitchen aspirin EC 81 MG tablet  Take 81 mg by mouth daily.  . bisacodyl (DULCOLAX) 10 MG suppository Place 10 mg rectally daily as needed for moderate constipation.  . bisacodyl (DULCOLAX) 5 MG EC tablet Take 1 tablet (5 mg total) by mouth daily as needed for moderate constipation.  Marland Kitchen BREO ELLIPTA 100-25 MCG/INH AEPB Take 1 puff by mouth daily.  . calcium-vitamin D (OSCAL WITH D) 500-200 MG-UNIT tablet Take 1 tablet by mouth 2 (two) times daily.  . citalopram (CELEXA) 10 MG tablet Take 10 mg  by mouth daily.  . Cranberry 450 MG CAPS Take 2 tablets by mouth 2 (two) times daily.  . cyclobenzaprine (FLEXERIL) 5 MG tablet Take 1 tablet (5 mg total) by mouth 3 (three) times daily as needed for muscle spasms.  Marland Kitchen diltiazem (CARDIZEM LA) 300 MG 24 hr tablet Take 300 mg by mouth daily.  Marland Kitchen esomeprazole (NEXIUM) 40 MG packet Take 40 mg by mouth daily before breakfast.  . HYDROcodone-acetaminophen (NORCO/VICODIN) 5-325 MG tablet Take 1 tablet by mouth every 6 (six) hours as needed for moderate pain.  . INCRUSE ELLIPTA 62.5 MCG/INH AEPB Take 1 puff by mouth daily.  Marland Kitchen lisinopril (PRINIVIL,ZESTRIL) 40 MG tablet Take 40 mg by mouth daily.  . methimazole (TAPAZOLE) 5 MG tablet Take 5 mg by mouth daily.  . mycophenolate (CELLCEPT) 500 MG tablet Take 1 tablet (500 mg total) by mouth 2 (two) times daily.  . ondansetron (ZOFRAN) 4 MG tablet Take 1 tablet (4 mg total) by mouth every 8 (eight) hours as needed for nausea or vomiting.  . polyethylene glycol (MIRALAX / GLYCOLAX) packet Take 17 g by mouth daily.  . sennosides-docusate sodium (SENOKOT-S) 8.6-50 MG tablet Take 1 tablet by mouth 2 (two) times daily. (Patient not taking: Reported on 01/17/2018)  . spironolactone (ALDACTONE) 25 MG tablet Take 25 mg by mouth daily.  . sucralfate (CARAFATE) 1 GM/10ML suspension Take 10 mLs (1 g total) by mouth 4 (four) times daily -  with meals and at bedtime. (Patient not taking: Reported on 01/17/2018)  . SUMAtriptan (IMITREX) 25 MG tablet Take 25 mg by mouth every 2 (two) hours as needed for migraine. May repeat in 2 hours if headache persists or recurs.  . SUMAtriptan (IMITREX) 50 MG tablet Take 50 mg by mouth at bedtime as needed for migraine. May repeat in 2 hours if headache persists or recurs.  . TRAZODONE HCL PO Take 25 mg by mouth 2 (two) times daily as needed (anxiety).  Marland Kitchen zolpidem (AMBIEN) 10 MG tablet Take 10 mg by mouth at bedtime as needed for sleep.   No facility-administered encounter medications on  file as of 01/17/2018.     PHYSICAL EXAM:   General: NAD, well-developed, well-groomed Cardiovascular: regular rate and rhythm Pulmonary: clear ant fields Abdomen: soft, nontender, + bowel sounds GU: no suprapubic tenderness Extremities: no edema, no joint deformities Skin: no rashes of exposed skin Neurological: Weakness but otherwise nonfocal  Stephanie G [place], NP

## 2018-02-03 ENCOUNTER — Non-Acute Institutional Stay: Payer: Medicare Other | Admitting: Nurse Practitioner

## 2018-02-03 ENCOUNTER — Encounter: Payer: Self-pay | Admitting: Nurse Practitioner

## 2018-02-03 DIAGNOSIS — Z515 Encounter for palliative care: Secondary | ICD-10-CM

## 2018-02-03 DIAGNOSIS — F339 Major depressive disorder, recurrent, unspecified: Secondary | ICD-10-CM

## 2018-02-03 DIAGNOSIS — G8929 Other chronic pain: Secondary | ICD-10-CM

## 2018-02-03 DIAGNOSIS — R269 Unspecified abnormalities of gait and mobility: Secondary | ICD-10-CM

## 2018-02-03 DIAGNOSIS — R296 Repeated falls: Secondary | ICD-10-CM

## 2018-02-03 NOTE — Progress Notes (Signed)
PALLIATIVE CARE CONSULT VISIT   PATIENT NAME: Paula Bird DOB: 1926/01/17 MRN: [id number]   [facility] RM # 1  PRIMARY CARE PROVIDER:   Angela Cox, MD  REFERRING PROVIDER:  Angela Cox, MD [phone] [place], [place] [address]  RESPONSIBLE PARTY:  Adrinne Sze (son) [phone]  HISTORY OF PRESENT ILLNESS:  Paula Bird is a 84 y.o. year old female with multiple medical problems including myasthenia gravis, frequent falls, gait disturbance, macular degeneration, depression, IBS, COPD,HTN, hyperthyroidism, PAF. Palliative Care was asked to help with symptom management and to address goals of care.    ASSESSMENT/PLAN/RECOMMENDATIONS      -recent fall with no fracture; left hip and foot pain -decreased mobility due to fear of fall  -gait disorder -continue same -Consider PT eval   -CKD -COPD -HTN -hyperthyroidism 2/2 amiodarone -PAF (no AC 2/2 falls)  -Breo Ellipta 100/25 1INH qd -lisinopril 40mg  qd -methimaxole 5mg  daily -diltiazem 300mg  daily -none  -IBS -GERD  -depression, insomnia -avoid greasy foods -weight stable (143) -miralax 17g daily -senokot 8.6/50 1 BID -PRN dulcolax and zofran -esomeprazole 40mg  daily -celexa 10mg  daily -increase celexa to 20mg    Chronic pain -continue same -  -ACP -will set up meeting with son to discuss    I spent 30 minutes providing this consultation,  from 13:00 to 13:30. More than 50% of the time in this consultation was spent coordinating communication.    CODE STATUS: TBD  PPS: 50% HOSPICE ELIGIBILITY/DIAGNOSIS: TBD  PAST MEDICAL HISTORY:  Past Medical History:  Diagnosis Date  . Acute and chronic respiratory failure, unspecified whether with hypoxia or hypercapnia (HCC)   . Afib (HCC)   . Allergic rhinitis   . Anemia   . CKD (chronic kidney disease)   . Constipation   . COPD (chronic obstructive pulmonary disease) (HCC)   . Depression   . Diplopia   .  Dysphagia   . Edema, lower extremity   . Esophageal dysmotility   . Gait instability   . GERD (gastroesophageal reflux disease)   . Hypertension   . Hypothyroidism   . IBS (irritable bowel syndrome)   . IDA (iron deficiency anemia)   . Insomnia   . Leukocytosis   . Myasthenia gravis (HCC)   . Oral thrush   . PAF (paroxysmal atrial fibrillation) (HCC)   . Physical deconditioning   . Pneumonia 02/2017  . Protein calorie malnutrition (HCC)   . Thyroid disease   . Thyroid mass   . Vertigo     SOCIAL HX:  Social History   Tobacco Use  . Smoking status: Never Smoker  . Smokeless tobacco: Never Used  Substance Use Topics  . Alcohol use: No    ALLERGIES: No Known Allergies   PERTINENT MEDICATIONS:  Outpatient Encounter Medications as of 02/03/2018  Medication Sig  . albuterol (PROVENTIL HFA;VENTOLIN HFA) 108 (90 Base) MCG/ACT inhaler Inhale 2 puffs into the lungs every 6 (six) hours as needed for wheezing or shortness of breath.  Marland Kitchen amiodarone (PACERONE) 200 MG tablet Take 200 mg by mouth daily.  Marland Kitchen amitriptyline (ELAVIL) 50 MG tablet Take 50 mg by mouth at bedtime.  Marland Kitchen aspirin EC 81 MG tablet Take 81 mg by mouth daily.  . bisacodyl (DULCOLAX) 10 MG suppository Place 10 mg rectally daily as needed for moderate constipation.  . bisacodyl (DULCOLAX) 5 MG EC tablet Take 1 tablet (5 mg total) by mouth daily as needed for moderate constipation.  Marland Kitchen BREO  ELLIPTA 100-25 MCG/INH AEPB Take 1 puff by mouth daily.  . calcium-vitamin D (OSCAL WITH D) 500-200 MG-UNIT tablet Take 1 tablet by mouth 2 (two) times daily.  . citalopram (CELEXA) 10 MG tablet Take 10 mg by mouth daily.  . Cranberry 450 MG CAPS Take 2 tablets by mouth 2 (two) times daily.  . cyclobenzaprine (FLEXERIL) 5 MG tablet Take 1 tablet (5 mg total) by mouth 3 (three) times daily as needed for muscle spasms.  Marland Kitchen diltiazem (CARDIZEM LA) 300 MG 24 hr tablet Take 300 mg by mouth daily.  Marland Kitchen esomeprazole (NEXIUM) 40 MG packet Take 40  mg by mouth daily before breakfast.  . HYDROcodone-acetaminophen (NORCO/VICODIN) 5-325 MG tablet Take 1 tablet by mouth every 6 (six) hours as needed for moderate pain.  . INCRUSE ELLIPTA 62.5 MCG/INH AEPB Take 1 puff by mouth daily.  Marland Kitchen lisinopril (PRINIVIL,ZESTRIL) 40 MG tablet Take 40 mg by mouth daily.  . methimazole (TAPAZOLE) 5 MG tablet Take 5 mg by mouth daily.  . mycophenolate (CELLCEPT) 500 MG tablet Take 1 tablet (500 mg total) by mouth 2 (two) times daily.  . ondansetron (ZOFRAN) 4 MG tablet Take 1 tablet (4 mg total) by mouth every 8 (eight) hours as needed for nausea or vomiting.  . polyethylene glycol (MIRALAX / GLYCOLAX) packet Take 17 g by mouth daily.  . sennosides-docusate sodium (SENOKOT-S) 8.6-50 MG tablet Take 1 tablet by mouth 2 (two) times daily. (Patient not taking: Reported on 01/17/2018)  . spironolactone (ALDACTONE) 25 MG tablet Take 25 mg by mouth daily.  . sucralfate (CARAFATE) 1 GM/10ML suspension Take 10 mLs (1 g total) by mouth 4 (four) times daily -  with meals and at bedtime. (Patient not taking: Reported on 01/17/2018)  . SUMAtriptan (IMITREX) 25 MG tablet Take 25 mg by mouth every 2 (two) hours as needed for migraine. May repeat in 2 hours if headache persists or recurs.  . SUMAtriptan (IMITREX) 50 MG tablet Take 50 mg by mouth at bedtime as needed for migraine. May repeat in 2 hours if headache persists or recurs.  . TRAZODONE HCL PO Take 25 mg by mouth 2 (two) times daily as needed (anxiety).  Marland Kitchen zolpidem (AMBIEN) 10 MG tablet Take 10 mg by mouth at bedtime as needed for sleep.   No facility-administered encounter medications on file as of 02/03/2018.     PHYSICAL EXAM:   General: NAD, well-developed, well-groomed Cardiovascular: regular rate and rhythm Pulmonary: clear ant fields Abdomen: soft, nontender, + bowel sounds GU: no suprapubic tenderness Extremities: no edema, no joint deformities Skin: no rashes of exposed skin Neurological: Weakness but  otherwise nonfocal  Stephanie G [place], NP

## 2018-05-05 ENCOUNTER — Non-Acute Institutional Stay: Payer: Medicare Other | Admitting: Nurse Practitioner

## 2018-05-05 DIAGNOSIS — F339 Major depressive disorder, recurrent, unspecified: Secondary | ICD-10-CM

## 2018-05-05 DIAGNOSIS — R634 Abnormal weight loss: Secondary | ICD-10-CM

## 2018-05-05 DIAGNOSIS — F0390 Unspecified dementia without behavioral disturbance: Secondary | ICD-10-CM

## 2018-05-05 DIAGNOSIS — F419 Anxiety disorder, unspecified: Secondary | ICD-10-CM

## 2018-05-05 DIAGNOSIS — Z515 Encounter for palliative care: Secondary | ICD-10-CM

## 2018-05-05 DIAGNOSIS — R269 Unspecified abnormalities of gait and mobility: Secondary | ICD-10-CM

## 2018-05-08 NOTE — Progress Notes (Signed)
PALLIATIVE CARE CONSULT VISIT   PATIENT NAME: Paula Bird DOB: 24-Feb-1926 MRN: [id number]   [facility] RM # 1  PRIMARY CARE PROVIDER:   Angela Cox, MD  REFERRING PROVIDER:  Angela Cox, MD [phone] [place], [place] [address]  RESPONSIBLE PARTY:  Jamielee Khanam (son) [phone]  HISTORY OF PRESENT ILLNESS:  Paula Bird is a 85 y.o. year old female with multiple medical problems including myasthenia gravis, frequent falls, gait disturbance, macular degeneration, depression, IBS, COPD,HTN, hyperthyroidism, PAF. Palliative Care was asked to help with symptom management and to address goals of care.    Interim history : patient with no acute complaints  ASSESSMENT/PLAN/RECOMMENDATIONS      -frequent falls no fracture; -decreased mobility due to fear of fall  -gait disorder -continue same -Consider PT eval   -CKD -COPD -HTN -hyperthyroidism 2/2 amiodarone -PAF (no AC 2/2 falls)  -Breo Ellipta 100/25 1INH qd -lisinopril 40mg  qd -methimaxole 5mg  daily -diltiazem 300mg  daily -none  -IBS -GERD  -depression, insomnia -avoid greasy foods -weight stable (143) -miralax 17g daily -senokot 8.6/50 1 BID -PRN dulcolax and zofran -esomeprazole 40mg  daily -celexa 10mg  daily -increase celexa to 20mg    Chronic pain -continue same -  -ACP -Needs to be discussed with son. Date pending    I spent 15 minutes providing this consultation,  from 13:00 to 13:15. More than 50% of the time in this consultation was spent coordinating communication.    CODE STATUS: TBD  PPS: 50% HOSPICE ELIGIBILITY/DIAGNOSIS: TBD  PAST MEDICAL HISTORY:  Past Medical History:  Diagnosis Date  . Acute and chronic respiratory failure, unspecified whether with hypoxia or hypercapnia (HCC)   . Afib (HCC)   . Allergic rhinitis   . Anemia   . CKD (chronic kidney disease)   . Constipation   . COPD (chronic obstructive pulmonary disease) (HCC)     . Depression   . Diplopia   . Dysphagia   . Edema, lower extremity   . Esophageal dysmotility   . Gait instability   . GERD (gastroesophageal reflux disease)   . Hypertension   . Hypothyroidism   . IBS (irritable bowel syndrome)   . IDA (iron deficiency anemia)   . Insomnia   . Leukocytosis   . Myasthenia gravis (HCC)   . Oral thrush   . PAF (paroxysmal atrial fibrillation) (HCC)   . Physical deconditioning   . Pneumonia 02/2017  . Protein calorie malnutrition (HCC)   . Thyroid disease   . Thyroid mass   . Vertigo     SOCIAL HX:  Social History   Tobacco Use  . Smoking status: Never Smoker  . Smokeless tobacco: Never Used  Substance Use Topics  . Alcohol use: No    ALLERGIES: No Known Allergies   PERTINENT MEDICATIONS:  Outpatient Encounter Medications as of 05/05/2018  Medication Sig  . albuterol (PROVENTIL HFA;VENTOLIN HFA) 108 (90 Base) MCG/ACT inhaler Inhale 2 puffs into the lungs every 6 (six) hours as needed for wheezing or shortness of breath.  Marland Kitchen amiodarone (PACERONE) 200 MG tablet Take 200 mg by mouth daily.  Marland Kitchen amitriptyline (ELAVIL) 50 MG tablet Take 50 mg by mouth at bedtime.  Marland Kitchen aspirin EC 81 MG tablet Take 81 mg by mouth daily.  . bisacodyl (DULCOLAX) 10 MG suppository Place 10 mg rectally daily as needed for moderate constipation.  . bisacodyl (DULCOLAX) 5 MG EC tablet Take 1 tablet (5 mg total) by mouth daily as needed for moderate  constipation.  Marland Kitchen. BREO ELLIPTA 100-25 MCG/INH AEPB Take 1 puff by mouth daily.  . calcium-vitamin D (OSCAL WITH D) 500-200 MG-UNIT tablet Take 1 tablet by mouth 2 (two) times daily.  . citalopram (CELEXA) 10 MG tablet Take 10 mg by mouth daily.  . Cranberry 450 MG CAPS Take 2 tablets by mouth 2 (two) times daily.  . cyclobenzaprine (FLEXERIL) 5 MG tablet Take 1 tablet (5 mg total) by mouth 3 (three) times daily as needed for muscle spasms.  Marland Kitchen. diltiazem (CARDIZEM LA) 300 MG 24 hr tablet Take 300 mg by mouth daily.  Marland Kitchen.  esomeprazole (NEXIUM) 40 MG packet Take 40 mg by mouth daily before breakfast.  . HYDROcodone-acetaminophen (NORCO/VICODIN) 5-325 MG tablet Take 1 tablet by mouth every 6 (six) hours as needed for moderate pain.  . INCRUSE ELLIPTA 62.5 MCG/INH AEPB Take 1 puff by mouth daily.  Marland Kitchen. lisinopril (PRINIVIL,ZESTRIL) 40 MG tablet Take 40 mg by mouth daily.  . methimazole (TAPAZOLE) 5 MG tablet Take 5 mg by mouth daily.  . mycophenolate (CELLCEPT) 500 MG tablet Take 1 tablet (500 mg total) by mouth 2 (two) times daily.  . ondansetron (ZOFRAN) 4 MG tablet Take 1 tablet (4 mg total) by mouth every 8 (eight) hours as needed for nausea or vomiting.  . polyethylene glycol (MIRALAX / GLYCOLAX) packet Take 17 g by mouth daily.  . sennosides-docusate sodium (SENOKOT-S) 8.6-50 MG tablet Take 1 tablet by mouth 2 (two) times daily. (Patient not taking: Reported on 01/17/2018)  . spironolactone (ALDACTONE) 25 MG tablet Take 25 mg by mouth daily.  . sucralfate (CARAFATE) 1 GM/10ML suspension Take 10 mLs (1 g total) by mouth 4 (four) times daily -  with meals and at bedtime. (Patient not taking: Reported on 01/17/2018)  . SUMAtriptan (IMITREX) 25 MG tablet Take 25 mg by mouth every 2 (two) hours as needed for migraine. May repeat in 2 hours if headache persists or recurs.  . SUMAtriptan (IMITREX) 50 MG tablet Take 50 mg by mouth at bedtime as needed for migraine. May repeat in 2 hours if headache persists or recurs.  . TRAZODONE HCL PO Take 25 mg by mouth 2 (two) times daily as needed (anxiety).  Marland Kitchen. zolpidem (AMBIEN) 10 MG tablet Take 10 mg by mouth at bedtime as needed for sleep.   No facility-administered encounter medications on file as of 05/05/2018.     PHYSICAL EXAM:   General: NAD, well-developed, well-groomed Cardiovascular: regular rate and rhythm Pulmonary: clear ant fields Abdomen: soft, nontender, + bowel sounds GU: no suprapubic tenderness Extremities: no edema, no joint deformities Skin: no rashes of  exposed skin Neurological: Weakness but otherwise nonfocal  Aundray Cartlidge G [place], NP

## 2018-05-15 ENCOUNTER — Encounter: Payer: Self-pay | Admitting: Internal Medicine

## 2018-05-15 NOTE — Progress Notes (Addendum)
[facility] Palliative Care Consult Note Telephone: [phone](336) [phone]  Fax: [phone]) [phone]  PATIENT NAME: Paula Bird Leja DOB: Sep 14, 1925 MRN: [id number]    PRIMARY CARE PROVIDER:   Angela Coxasanayaka, Gayani Y, MD  RTherapist, nutritionalFERRING PROVIDER: [facility]:   Angela Coxasanayaka, Gayani Y, MD [address] [place], [place] [id number]  [facility] [address]  RESPONSIBLE PARTY:   Wynne DustGuy Voytko (son) [phone]  ASSESSMENT:      Weight 144.6lbs; up 1.8lbs from last month. 5'2"   IMPRESSION / RECOMMENDATIONS:  1. Chronic medical problems; management by facility MD:       A. Frequent falls with past fractures (lower back, pelvis, L hand and pelvis, concussion) Hendricks Limes/Myasthenia gravis (Cell Cept). Decreased mobility d/t fear of falling/gait disorder. Most recent fall was last year in June, with fracture of her ankle. Patient is continent of bowel and bladder. Independent in ADLs, though has a staff member present when she showers for guarding assist, if needed. Transfers independently, and ambulates independently with walker. Use of wheel chair for outings. Notes some gradual functional decline over the last 1 & 1/2 years following her official myastenia diagnosis. Notes recent recurrence of migraines (chronic x 50 yrs) such that she had 3 episodes over the last 3 weeks. Onset of pain over right eyebrow, radiating to top of right/mid scalp. She feels recent attacks were precipitated by an unsuccessful attmpt to wean her off of her amitriptyline. She takes prn sumatriptan. Migraine relieved if she is able to sleep it off.         B. CKD/COPD (Breo Ellipta inhaler, [place]ru[place] Ellipta)  /HTN (increased lisinopril dosage 40mg  qd, diltiazem 300mg  qd) /hyperthyroisism 2/2 amiodarone (Methimaxole 5mg  qd) / PAF (amiodarone; no anticoagulation 2/2 falls).       C. IBS/GERD (Nexium, carafate):  Dulcolax suppository prn, MiraLax bid.       D. Depression (Amitriptyline, Celexa) /insomnia (Ambien, trazadone)       E.  Chronic Pain: prn Narco  2. Advanced Care Planning: DNR on chart   3. Goals of Care: Patient has been a resident of [facility] for almost 4 years. She misses her own home. Though there are some folk whose company she likes, she finds most of the residents have dementia, and thus friendships are limited. She tends to keep to herself in her room. She enjoys listening to [employer] fan!). Has some difficulty reading. Really enjoys shopping. Her daughter takes her out about once a month, and patient private pays a care worker to take her out to eat or shop about once a week.  -we discussed perhaps obtaining an "Alexa" that could read books to her. I'll follow up with her son.   4 Follow up: NP visit in 1-2 months  I spent 60 minutes providing this consultation,  from 1:30pm to 2:30pm. More than 50% of the time in this consultation was spent coordinating communication, chart review, interview of staff and family members, and reconciling facility MAR with [facility] EMR.Marland Kitchen.   HISTORY OF PRESENT ILLNESS:  Paula Bird Zumbro is a 85 y.o. female with medical h/o myasthenia gravis, frequent falls, gait disturbance, macular degeneration, depression, IBS, COPD, HTN, hyperthyroidism, PAF. This is a routine f/u Palliative Care visit from 05/08/2018, to introduce new provider NP. We continue to follow to help with symptom management and to address goals of care.   CODE STATUS: DNR on chart  PPS: 50% HOSPICE ELIGIBILITY/DIAGNOSIS: TBD  PAST MEDICAL HISTORY:  Past Medical History:  Diagnosis Date  . Acute and chronic respiratory failure, unspecified whether with hypoxia or hypercapnia (HCC)   . Afib (HCC)   . Allergic rhinitis   . Anemia   . CKD (chronic kidney disease)   . Constipation   . COPD (chronic obstructive pulmonary disease) (HCC)   . Depression   . Diplopia   . Dysphagia   . Edema, lower extremity   . Esophageal dysmotility   . Gait instability   . GERD (gastroesophageal reflux disease)    . Hypertension   . Hypothyroidism   . IBS (irritable bowel syndrome)   . IDA (iron deficiency anemia)   . Insomnia   . Leukocytosis   . Myasthenia gravis (HCC)   . Oral thrush   . PAF (paroxysmal atrial fibrillation) (HCC)   . Physical deconditioning   . Pneumonia 02/2017  . Protein calorie malnutrition (HCC)   . Thyroid disease   . Thyroid mass   . Vertigo     SOCIAL HX:  Social History   Tobacco Use  . Smoking status: Never Smoker  . Smokeless tobacco: Never Used  Substance Use Topics  . Alcohol use: No    ALLERGIES: No Known Allergies   PERTINENT MEDICATIONS:  Outpatient Encounter Medications as of 05/16/2018  Medication Sig  . amiodarone (PACERONE) 200 MG tablet Take 200 mg by mouth daily.  Marland Kitchen amitriptyline (ELAVIL) 50 MG tablet Take 50 mg by mouth at bedtime.  Marland Kitchen aspirin EC 81 MG tablet Take 81 mg by mouth daily.  . bisacodyl (DULCOLAX) 10 MG suppository Place 10 mg rectally daily as needed for moderate constipation.  Marland Kitchen BREO ELLIPTA 100-25 MCG/INH AEPB Take 1 puff by mouth daily.  . citalopram (CELEXA) 10 MG tablet Take 10 mg by mouth daily.  Marland Kitchen diltiazem (CARDIZEM LA) 300 MG 24 hr tablet Take 300 mg by mouth daily.  Marland Kitchen esomeprazole (NEXIUM) 40 MG packet Take 40 mg by mouth daily before breakfast.  . HYDROcodone-acetaminophen (NORCO/VICODIN) 5-325 MG tablet Take 1 tablet by mouth every 4 (four) hours as needed for moderate pain.   . INCRUSE ELLIPTA 62.5 MCG/INH AEPB Take 1 puff by mouth daily.  Marland Kitchen lisinopril (PRINIVIL,ZESTRIL) 40 MG tablet Take 40 mg by mouth daily.  . methimazole (TAPAZOLE) 5 MG tablet Take 5 mg by mouth daily.  . mycophenolate (CELLCEPT) 500 MG tablet Take 1 tablet (500 mg total) by mouth 2 (two) times daily.  . polyethylene glycol (MIRALAX / GLYCOLAX) packet Take 17 g by mouth daily. (Patient taking differently: Take 17 g by mouth 2 (two) times daily. )  . sucralfate (CARAFATE) 1 GM/10ML suspension Take 10 mLs (1 g total) by mouth 4 (four) times daily  -  with meals and at bedtime.  . SUMAtriptan (IMITREX) 25 MG tablet Take 25 mg by mouth every 2 (two) hours as needed for migraine. May repeat in 2 hours if headache persists or recurs.  . SUMAtriptan (IMITREX) 50 MG tablet Take 50 mg by mouth at bedtime as needed for migraine. May repeat in 2 hours if headache persists or recurs.  . TRAZODONE HCL PO Take 25 mg by mouth 2 (two) times daily as needed (anxiety).  Marland Kitchen zolpidem (AMBIEN) 10 MG tablet Take 10 mg by mouth at bedtime as needed for sleep.  . [DISCONTINUED] albuterol (PROVENTIL HFA;VENTOLIN HFA) 108 (90 Base) MCG/ACT inhaler Inhale 2 puffs into the lungs every 6 (six) hours as needed for wheezing or shortness of breath.  . [DISCONTINUED] bisacodyl (DULCOLAX) 5 MG EC tablet  Take 1 tablet (5 mg total) by mouth daily as needed for moderate constipation.  . [DISCONTINUED] calcium-vitamin D (OSCAL WITH D) 500-200 MG-UNIT tablet Take 1 tablet by mouth 2 (two) times daily.  . [DISCONTINUED] Cranberry 450 MG CAPS Take 2 tablets by mouth 2 (two) times daily.  . [DISCONTINUED] cyclobenzaprine (FLEXERIL) 5 MG tablet Take 1 tablet (5 mg total) by mouth 3 (three) times daily as needed for muscle spasms.  . [DISCONTINUED] ondansetron (ZOFRAN) 4 MG tablet Take 1 tablet (4 mg total) by mouth every 8 (eight) hours as needed for nausea or vomiting.  . [DISCONTINUED] sennosides-docusate sodium (SENOKOT-S) 8.6-50 MG tablet Take 1 tablet by mouth 2 (two) times daily. (Patient not taking: Reported on 01/17/2018)  . [DISCONTINUED] spironolactone (ALDACTONE) 25 MG tablet Take 25 mg by mouth daily.   No facility-administered encounter medications on file as of 05/16/2018.     PHYSICAL EXAM:  VS: BP 160/80 HR: 64, RR 12 General: NAD, frail appearing, thin Cardiovascular: regular rate and rhythm Pulmonary: clear ant fields Abdomen: soft, nontender, + bowel sounds GU: no suprapubic tenderness Extremities: no edema, no joint deformities Skin: no  rashes Neurological: Weakness but otherwise nonfocal  Anselm Lis, NP

## 2018-05-16 ENCOUNTER — Non-Acute Institutional Stay: Payer: Medicare Other | Admitting: Internal Medicine

## 2018-05-16 VITALS — BP 160/80 | HR 64 | Resp 12

## 2018-05-16 DIAGNOSIS — Z515 Encounter for palliative care: Secondary | ICD-10-CM

## 2018-05-18 ENCOUNTER — Encounter: Payer: Self-pay | Admitting: Internal Medicine

## 2018-05-22 ENCOUNTER — Ambulatory Visit: Payer: Medicare Other | Admitting: Family Medicine

## 2018-05-26 NOTE — Progress Notes (Signed)
PATIENT: Paula Bird DOB: 10/19/1925  REASON FOR VISIT: follow up HISTORY FROM: patient  Chief Complaint  Patient presents with  . Follow-up    Headache and eye pain follow up. Caregiver present. New room. Patient mentioned that she has had a headache/ eye pain for 2 months.      HISTORY OF PRESENT ILLNESS: (copied from Dr Rhea Belton note on 01/09/2018) Paula Bird 85 year old right-handed female, accompanied by her daughter [name], seen in refer by primary care physician Dr. Merlene Laughter for evaluation of eye pain, right eye swelling, initial evaluation was on January 31 2016.  I reviewed and summarized the referring note, she had a history of hypertension, paroxysmal atrial fibrillation, depression, obsessive-compulsive disorder, acid reflux, polypharmacy treatment, also had a history of left knee, left hip replacement,  She used to lives alone at home, but began to suffer multiple falls around 2015, has moved to [facility] since 2016.  She was noted to have right side droopy eyelid swelling since September 2017, gradually getting worse, light sensitivity, she had chronic headache, tends to stay at the right side, with her new right eye symptoms, she noticed more right behind eye pain, she was seen by optometrist, was given eyedrops of unknown name, with only mild improvement. Is difficulty for her to open her right eye,  She denies rash broke out, she denies hearing loss, no dysarthria, no dysphagia  Laboratory evaluation in September 2017, normal CMP with creatinine 0.61, normal CBC, with hemoglobin of 12.2, TSH was decreased to 0.008 with high free T3 8.3, free thyroxine1.50,  Update February 07 2016: She was diagnosed with seropositive generalized myasthenia gravis, bulbar predominant,  Laboratory evaluationshowed positive acetylcholine binding antibody 8.3, blocking antibody 39, anti-striation antibodies 1:160, normal CPK 21, C-reactive  protein, ESR, folic acid, vitamin U04, mild elevated free T3 4.Normal free T4, decreased TSH less than 0.01,  She continue have significant right ptosis, intermittent double vision, denies swallowing difficulty, gait abnormality she attributed to left hip and knee pain,  UPDATE Dec 7th 2017: She was seen by endocrinologist Dr. 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 Einar Gip showed no significant abnormality She is able to tolerate prednisone, on tapering dose, CellCept 500 mg twice a day, Mestinon 3 times a day CT chest showed multiple bilateral lung nodule, no thymus pathology noticed, there was aorta 4 cm aneurysm  UPDATE June 07 2016: Seropositive generalized myasthenia gravis She can open her right eye much better, denied double vision, continue have gait difficulty, but sounds like a chronic problem, reviewed her medication list, she is taking CellCept 500 mg twice a day, no longer taking prednisone, and Mestinon 60 mg 3 times a day,  UPDATE October 16 2016:YY Was able to review laboratory evaluations, normal TSH, free T4 was mildly elevated 0.5, negative ANA,there is no significant change in her myasthenia gravis, she has no double vision, no droopy eyelid, able to tolerate CellCept 500 mg twice a day  Update January 09, 2018: She is here to follow-up myasthenia gravis, continue taking CellCept 500 mg twice a day, Mestinon is no longer on her medication list, due to significant GI side effect, there was no worsening of her myasthenia gravis symptoms after stopping Mestinon, in specific she denies diplopia, no droopy eyelid,  She had bilateral ankle fracture in summer 2019, came in wheelchair today, she continues  to complain of GI symptoms, diarrhea now with constipation, insomnia,   She also complains ofworseninganxiety ,  depression, feel anxious, shortness of breath, bilateral eye itching,  Update 05/27/18 Paula Bird is a 85 y.o. female here today for follow up. She reports worsening of her right eye pain and headaches. She reports that her right eye feels tender to touch all the time. She describes an achy sensation (sometimes sharp sensation) that goes from the back of her right eye to the top of her head. When she has this pain, she feels that she is having a migraine. She gets nauseated and is sensitive to light and sound. She reports that pain is relieved by hydrocodone/acetaminophen tablet. She can not take Nsaids due to stomach upset and Tylenol does not work. She is not a good candidate for triptan therapy. She denies diplopia or ptosis. She has not noticed any weakness. She continues CellCept 572m twice daily. She is being seen by Palliative Care.     REVIEW OF SYSTEMS: Out of a complete 14 system review of symptoms, the patient complains only of the following symptoms, right eye pain, headaches and all other reviewed systems are negative.   ALLERGIES: No Known Allergies  HOME MEDICATIONS: Outpatient Medications Prior to Visit  Medication Sig Dispense Refill  . amiodarone (PACERONE) 200 MG tablet Take 200 mg by mouth daily.  0  . amitriptyline (ELAVIL) 50 MG tablet Take 50 mg by mouth at bedtime.  0  . artificial tears (LACRILUBE) OINT ophthalmic ointment Place 1 application into both eyes at bedtime.    .Marland Kitchenaspirin EC 81 MG tablet Take 81 mg by mouth daily.    . bisacodyl (DULCOLAX) 10 MG suppository Place 10 mg rectally daily as needed for moderate constipation.    . citalopram (CELEXA) 10 MG tablet Take 10 mg by mouth daily.    .Marland Kitchendiltiazem (CARDIZEM LA) 300 MG 24 hr tablet Take 300 mg by mouth daily.  10  . esomeprazole (NEXIUM) 40 MG packet Take 40 mg by mouth daily before breakfast.    . HYDROcodone-acetaminophen (NORCO/VICODIN) 5-325 MG tablet Take 1 tablet by mouth every 4 (four) hours as  needed for moderate pain.     . hydroxypropyl methylcellulose / hypromellose (ISOPTO TEARS / GONIOVISC) 2.5 % ophthalmic solution Place 1 drop into both eyes 3 (three) times daily.    . INCRUSE ELLIPTA 62.5 MCG/INH AEPB Take 1 puff by mouth daily.  0  . lisinopril (PRINIVIL,ZESTRIL) 40 MG tablet Take 40 mg by mouth daily.  10  . METHIMAZOLE PO Take 7.5 mg by mouth daily.    . mycophenolate (CELLCEPT) 500 MG tablet Take 1 tablet (500 mg total) by mouth 2 (two) times daily. 60 tablet 11  . ondansetron (ZOFRAN) 4 MG tablet Take 4 mg by mouth every 8 (eight) hours as needed for nausea or vomiting.    . polyethylene glycol (MIRALAX / GLYCOLAX) packet Take 17 g by mouth daily. (Patient taking differently: Take 17 g by mouth 2 (two) times daily. ) 14 each 12  . sucralfate (CARAFATE) 1 GM/10ML suspension Take 10 mLs (1 g total) by mouth 4 (four) times daily -  with meals and at bedtime. 420 mL 0  . TRAZODONE HCL PO Take 25 mg by mouth 2 (two) times daily as needed (anxiety).    .Marland Kitchenzolpidem (AMBIEN) 10 MG tablet Take 10 mg by mouth at bedtime.     . SUMAtriptan (IMITREX) 25 MG tablet Take 25 mg by mouth  every 2 (two) hours as needed for migraine. May repeat in 2 hours if headache persists or recurs.    . SUMAtriptan (IMITREX) 50 MG tablet Take 50 mg by mouth at bedtime as needed for migraine. May repeat in 2 hours if headache persists or recurs.    Marland Kitchen BREO ELLIPTA 100-25 MCG/INH AEPB Take 1 puff by mouth daily.  0  . methimazole (TAPAZOLE) 5 MG tablet Take 5 mg by mouth daily.     No facility-administered medications prior to visit.     PAST MEDICAL HISTORY: Past Medical History:  Diagnosis Date  . Acute and chronic respiratory failure, unspecified whether with hypoxia or hypercapnia ([place])   . Afib ([place])   . Allergic rhinitis   . Anemia   . CKD (chronic kidney disease)   . Constipation   . COPD (chronic obstructive pulmonary disease) ([place])   . Depression   . Diplopia   . Dysphagia   . Edema,  lower extremity   . Esophageal dysmotility   . Gait instability   . GERD (gastroesophageal reflux disease)   . Hypertension   . Hypothyroidism   . IBS (irritable bowel syndrome)   . IDA (iron deficiency anemia)   . Insomnia   . Leukocytosis   . Myasthenia gravis ([place])   . Oral thrush   . PAF (paroxysmal atrial fibrillation) ([place])   . Physical deconditioning   . Pneumonia 02/2017  . Protein calorie malnutrition ([place])   . Thyroid disease   . Thyroid mass   . Vertigo     PAST SURGICAL HISTORY: Past Surgical History:  Procedure Laterality Date  . ABDOMINAL HYSTERECTOMY    . APPENDECTOMY    . ESOPHAGOGASTRODUODENOSCOPY (EGD) WITH PROPOFOL N/A 07/21/2017   Procedure: ESOPHAGOGASTRODUODENOSCOPY (EGD) WITH PROPOFOL;  Surgeon: Ronnette Juniper, MD;  Location: [facility];  Service: Gastroenterology;  Laterality: N/A;  . TOTAL HIP ARTHROPLASTY Left   . TOTAL KNEE ARTHROPLASTY Left     FAMILY HISTORY: Family History  Problem Relation Age of Onset  . Hypertension Mother   . Hypertension Son   . Diabetes Neg Hx   . Thyroid disease Neg Hx     SOCIAL HISTORY: Social History   Socioeconomic History  . Marital status: Widowed    Spouse name: Not on file  . Number of children: 2  . Years of education: HS  . Highest education level: Not on file  Occupational History  . Occupation: Retired  Scientific laboratory technician  . Financial resource strain: Not on file  . Food insecurity:    Worry: Not on file    Inability: Not on file  . Transportation needs:    Medical: Not on file    Non-medical: Not on file  Tobacco Use  . Smoking status: Never Smoker  . Smokeless tobacco: Never Used  Substance and Sexual Activity  . Alcohol use: No  . Drug use: No  . Sexual activity: Not on file  Lifestyle  . Physical activity:    Days per week: Not on file    Minutes per session: Not on file  . Stress: Not on file  Relationships  . Social connections:    Talks on phone: Not on file    Gets together:  Not on file    Attends religious service: Not on file    Active member of club or organization: Not on file    Attends meetings of clubs or organizations: Not on file    Relationship status: Not on file  .  Intimate partner violence:    Fear of current or ex partner: Not on file    Emotionally abused: Not on file    Physically abused: Not on file    Forced sexual activity: Not on file  Other Topics Concern  . Not on file  Social History Narrative   Resides at [facility].   Left-handed.   2 cups caffeine per day.      PHYSICAL EXAM  Vitals:   05/27/18 1304  BP: (!) 136/57  Pulse: 75  Weight: 143 lb 12.8 oz (65.2 kg)  Height: 5' (1.524 m)   Body mass index is 28.08 kg/m.  Generalized: Well developed, in no acute distress  Cardiology: normal rate and rhythm, no murmur noted Skin: tenderness reported to palpation of right temple, no edema, erythema or drainage from eye noted. Neurological examination  Mentation: Alert oriented to time, place, history taking. Follows all commands speech and language fluent Cranial nerve II-XII: Pupils were equal round reactive to light. Extraocular movements were full, visual field were full on confrontational test. Facial sensation and strength were normal. Uvula tongue midline. Head turning and shoulder shrug  were normal and symmetric. Motor: The motor testing reveals 4 over 5 strength of all 4 extremities. Good symmetric motor tone is noted throughout.  Sensory: Sensory testing is intact to soft touch on all 4 extremities. No evidence of extinction is noted.  Coordination: Cerebellar testing reveals good finger-nose-finger bilaterally.  Gait and station: Gait is narrow and slow (with Rolator) Reflexes: Deep tendon reflexes are symmetric and normal bilaterally.   DIAGNOSTIC DATA (LABS, IMAGING, TESTING) - I reviewed patient records, labs, notes, testing and imaging myself where available.  No flowsheet data found.   Lab  Results  Component Value Date   WBC 10.3 10/19/2017   HGB 12.4 10/19/2017   HCT 39.3 10/19/2017   MCV 86.2 10/19/2017   PLT 199 10/19/2017      Component Value Date/Time   NA 137 10/20/2017 0451   NA 141 10/16/2016 1429   K 4.5 10/20/2017 0451   CL 105 10/20/2017 0451   CO2 26 10/20/2017 0451   GLUCOSE 108 (H) 10/20/2017 0451   BUN 32 (H) 10/20/2017 0451   BUN 15 10/16/2016 1429   CREATININE 0.67 10/20/2017 0451   CALCIUM 8.9 10/20/2017 0451   PROT 7.7 10/19/2017 1126   PROT 6.6 10/16/2016 1429   ALBUMIN 4.0 10/19/2017 1126   ALBUMIN 4.1 10/16/2016 1429   AST 18 10/19/2017 1126   ALT 17 10/19/2017 1126   ALKPHOS 59 10/19/2017 1126   BILITOT 0.5 10/19/2017 1126   BILITOT <0.2 10/16/2016 1429   GFRNONAA >60 10/20/2017 0451   GFRAA >60 10/20/2017 0451   No results found for: CHOL, HDL, LDLCALC, LDLDIRECT, TRIG, CHOLHDL Lab Results  Component Value Date   HGBA1C 6.0 (H) 08/11/2015   Lab Results  Component Value Date   VITAMINB12 374 01/31/2016   Lab Results  Component Value Date   TSH 5.92 (H) 05/16/2017    ASSESSMENT AND PLAN 85 y.o. year old female  has a past medical history of Acute and chronic respiratory failure, unspecified whether with hypoxia or hypercapnia (HCC), Afib ([place]), Allergic rhinitis, Anemia, CKD (chronic kidney disease), Constipation, COPD (chronic obstructive pulmonary disease) ([place]), Depression, Diplopia, Dysphagia, Edema, lower extremity, Esophageal dysmotility, Gait instability, GERD (gastroesophageal reflux disease), Hypertension, Hypothyroidism, IBS (irritable bowel syndrome), IDA (iron deficiency anemia), Insomnia, Leukocytosis, Myasthenia gravis ([place]), Oral thrush, PAF (paroxysmal atrial fibrillation) ([place]), Physical deconditioning, Pneumonia (  02/2017), Protein calorie malnutrition ([place]), Thyroid disease, Thyroid mass, and Vertigo. here with     ICD-10-CM   1. Myasthenia gravis ([place]) G70.00   2. Pain in right eye H57.11 Sedimentation Rate     C-reactive Protein    Unfortunately Paula Bird continues to have right-sided eye pain and headaches.  She is concerned that headaches are migrainous in nature.  She does have a history of migraines.  We are limited on abortive therapies due to comorbidities and side effects.  She is tolerating hydrocodone/acetaminophen 5-325 mg as needed for pain.  We will continue this for abortive therapy.  I have advised only 1 to 2 tablets/week.  She was instructed to contact our office should she need more frequent treatment.  I have reassured that there is no diplopia or proptosis.  There is mild tenderness over the temporal area on exam however no erythema or edema is noted.  We will order a sed rate and ESR today for evaluation.  We will have her follow-up with Dr. Krista Blue in 6 months, sooner if needed.   Orders Placed This Encounter  Procedures  . Sedimentation Rate  . C-reactive Protein     No orders of the defined types were placed in this encounter.     I spent 15 minutes with the patient. 50% of this time was spent counseling and educating patient on plan of care and medications.    Debbora Presto, FNP-C 05/27/2018, 3:19 PM [facility] [address], [place], [place] [address] [phone]

## 2018-05-27 ENCOUNTER — Encounter: Payer: Self-pay | Admitting: Family Medicine

## 2018-05-27 ENCOUNTER — Ambulatory Visit (INDEPENDENT_AMBULATORY_CARE_PROVIDER_SITE_OTHER): Payer: Medicare Other | Admitting: Family Medicine

## 2018-05-27 VITALS — BP 136/57 | HR 75 | Ht 60.0 in | Wt 143.8 lb

## 2018-05-27 DIAGNOSIS — G7000 Myasthenia gravis without (acute) exacerbation: Secondary | ICD-10-CM | POA: Diagnosis not present

## 2018-05-27 DIAGNOSIS — H5711 Ocular pain, right eye: Secondary | ICD-10-CM | POA: Diagnosis not present

## 2018-05-27 NOTE — Patient Instructions (Signed)
Continue Cell cept 500mg  twice daily  Will use Hydrocodone/acetaminopohen 5-325mg  as needed for migraines (no more than 2 tablets in 1 week)   Migraine Headache  A migraine headache is a very strong throbbing pain on one side or both sides of your head. Migraines can also cause other symptoms. Talk with your doctor about what things may bring on (trigger) your migraine headaches. Follow these instructions at home: Medicines  Take over-the-counter and prescription medicines only as told by your doctor.  Do not drive or use heavy machinery while taking prescription pain medicine.  To prevent or treat constipation while you are taking prescription pain medicine, your doctor may recommend that you: ? Drink enough fluid to keep your pee (urine) clear or pale yellow. ? Take over-the-counter or prescription medicines. ? Eat foods that are high in fiber. These include fresh fruits and vegetables, whole grains, and beans. ? Limit foods that are high in fat and processed sugars. These include fried and sweet foods. Lifestyle  Avoid alcohol.  Do not use any products that contain nicotine or tobacco, such as cigarettes and e-cigarettes. If you need help quitting, ask your doctor.  Get at least 8 hours of sleep every night.  Limit your stress. General instructions   Keep a journal to find out what may bring on your migraines. For example, write down: ? What you eat and drink. ? How much sleep you get. ? Any change in what you eat or drink. ? Any change in your medicines.  If you have a migraine: ? Avoid things that make your symptoms worse, such as bright lights. ? It may help to lie down in a dark, quiet room. ? Do not drive or use heavy machinery. ? Ask your doctor what activities are safe for you.  Keep all follow-up visits as told by your doctor. This is important. Contact a doctor if:  You get a migraine that is different or worse than your usual migraines. Get help right  away if:  Your migraine gets very bad.  You have a fever.  You have a stiff neck.  You have trouble seeing.  Your muscles feel weak or like you cannot control them.  You start to lose your balance a lot.  You start to have trouble walking.  You pass out (faint). This information is not intended to replace advice given to you by your health care provider. Make sure you discuss any questions you have with your health care provider. Document Released: 12/20/2007 Document Revised: 12/04/2017 Document Reviewed: 08/29/2015 Elsevier Interactive Patient Education  2019 Elsevier Inc.   Myasthenia Gravis Myasthenia gravis (MG) is a long-term (chronic) condition that causes weakness in the muscles you can control (voluntary muscles). MG can affect any voluntary muscle. The muscles most often affected are the ones that control:  Eye movement.  Facial movements.  Swallowing. MG is a disease in which the body's disease-fighting system (immune system) attacks its own healthy tissues (autoimmune disease). When you have MG, your immune system makes proteins (antibodies) that block the chemical (acetylcholine) that your body needs to send nerve signals to your muscles. This causes muscle weakness. What are the causes? The exact cause of MG is not known. What increases the risk? The following factors may make you more likely to develop this condition:  Having an enlarged thymus gland. The thymus gland is located under the breastbone. It makes certain cells for the immune system.  Having a family history of MG. What are the  signs or symptoms? Symptoms of MG may include:  Drooping eyelids.  Double vision.  Muscle weakness that gets worse with activity and gets better after rest.  Difficulty walking.  Trouble chewing and swallowing.  Trouble making facial expressions.  Slurred speech.  Weakness of the arms, hands, and legs. Sudden, severe difficulty breathing (myasthenic crisis)  may develop after having:  An infection.  A fever.  A bad reaction to a medicine. Myasthenic crisis requires emergency breathing support. Sometimes symptoms of MG go away for a while (remission) and then come back later. How is this diagnosed? This condition may be diagnosed based on:  Your symptoms and medical history.  A physical exam.  Blood tests.  Tests of your muscle strength and function.  Imaging tests, such as a CT scan or an MRI. How is this treated? The goal of treatment is to improve muscle strength. Treatment may include:  Taking medicine.  Making lifestyle changes that focus on saving your energy.  Doing physical therapy to gain strength.  Having surgery to remove the thymus gland (thymectomy). This may result in a long remission for some people.  Having a procedure to remove the acetylcholine antibodies (plasmapheresis).  Getting emergency breathing support, if you experience myasthenic crisis. If you experience remission, you may be able to stop treatment and then resume treatment when your symptoms return. Follow these instructions at home:   Take over-the-counter and prescription medicines only as told by your health care provider.  Get plenty of rest and sleep. Take frequent breaks to rest your eyes, especially when in bright light or working on a computer.  Maintain a healthy diet and a healthy weight. Work with your health care provider or a diet and nutrition specialist (dietitian) if you need help.  Do exercises as told by your health care provider or physical therapist.  Do not use any products that contain nicotine or tobacco, such as cigarettes and e-cigarettes. If you need help quitting, ask your health care provider.  Prevent infections by: ? Washing your hands often with soap and water. If soap and water are not available, use hand sanitizer. ? Avoiding contact with other people who are sick. ? Avoiding touching your eyes, nose, and  mouth. ? Cleaning surfaces in your home that are touched often using a disinfectant.  Keep all follow-up visits as told by your health care provider. This is important. Contact a health care provider if:  Your symptoms change or get worse, especially after having a fever or infection. Get help right away if:  You have trouble breathing. Summary  Myasthenia gravis (MG) is a long-term (chronic) condition that causes weakness in the muscles you can control (voluntary muscles).  A symptom of MG is muscle weakness that gets worse with activity and gets better after rest.  Sudden, severe difficulty breathing (myasthenic crisis) may develop after having an infection, a fever, or a bad reaction to a medicine.  The goal of treatment is to improve muscle strength. Treatment may include medicines, lifestyle changes, physical therapy, surgery, plasmapheresis, or emergency breathing support. This information is not intended to replace advice given to you by your health care provider. Make sure you discuss any questions you have with your health care provider. Document Released: 06/18/2000 Document Revised: 03/25/2017 Document Reviewed: 03/25/2017 Elsevier Interactive Patient Education  2019 [employer].

## 2018-05-28 LAB — SEDIMENTATION RATE: SED RATE: 41 mm/h — AB (ref 0–40)

## 2018-05-28 LAB — C-REACTIVE PROTEIN: CRP: 3 mg/L (ref 0–10)

## 2018-05-29 ENCOUNTER — Telehealth: Payer: Self-pay

## 2018-05-29 NOTE — Telephone Encounter (Signed)
Spoke with the patient's son(DPR verified) and he verbalized understanding the results. No other questions or concerns at this time.

## 2018-05-29 NOTE — Telephone Encounter (Signed)
-----   Message from Shawnie Dapper, NP sent at 05/28/2018 12:49 PM EST ----- Labs are unremarkable

## 2018-06-10 NOTE — Progress Notes (Signed)
I have reviewed and agreed above plan. 

## 2018-07-08 ENCOUNTER — Encounter (HOSPITAL_COMMUNITY): Payer: Self-pay | Admitting: *Deleted

## 2018-07-08 ENCOUNTER — Emergency Department (HOSPITAL_COMMUNITY): Payer: Medicare Other

## 2018-07-08 ENCOUNTER — Emergency Department (HOSPITAL_COMMUNITY)
Admission: EM | Admit: 2018-07-08 | Discharge: 2018-07-09 | Disposition: A | Discharge status: Skilled Nursing Facility | Payer: Medicare Other | Attending: Emergency Medicine | Admitting: Emergency Medicine

## 2018-07-08 ENCOUNTER — Other Ambulatory Visit: Payer: Self-pay

## 2018-07-08 DIAGNOSIS — Y999 Unspecified external cause status: Secondary | ICD-10-CM | POA: Diagnosis not present

## 2018-07-08 DIAGNOSIS — S0101XA Laceration without foreign body of scalp, initial encounter: Secondary | ICD-10-CM | POA: Insufficient documentation

## 2018-07-08 DIAGNOSIS — Y9389 Activity, other specified: Secondary | ICD-10-CM | POA: Insufficient documentation

## 2018-07-08 DIAGNOSIS — N189 Chronic kidney disease, unspecified: Secondary | ICD-10-CM | POA: Diagnosis not present

## 2018-07-08 DIAGNOSIS — Y92008 Other place in unspecified non-institutional (private) residence as the place of occurrence of the external cause: Secondary | ICD-10-CM | POA: Insufficient documentation

## 2018-07-08 DIAGNOSIS — G7000 Myasthenia gravis without (acute) exacerbation: Secondary | ICD-10-CM | POA: Insufficient documentation

## 2018-07-08 DIAGNOSIS — M542 Cervicalgia: Secondary | ICD-10-CM | POA: Diagnosis not present

## 2018-07-08 DIAGNOSIS — M546 Pain in thoracic spine: Secondary | ICD-10-CM | POA: Diagnosis not present

## 2018-07-08 DIAGNOSIS — Z7982 Long term (current) use of aspirin: Secondary | ICD-10-CM | POA: Insufficient documentation

## 2018-07-08 DIAGNOSIS — W19XXXA Unspecified fall, initial encounter: Secondary | ICD-10-CM

## 2018-07-08 DIAGNOSIS — J449 Chronic obstructive pulmonary disease, unspecified: Secondary | ICD-10-CM | POA: Insufficient documentation

## 2018-07-08 DIAGNOSIS — S0990XA Unspecified injury of head, initial encounter: Secondary | ICD-10-CM | POA: Diagnosis present

## 2018-07-08 DIAGNOSIS — Z79899 Other long term (current) drug therapy: Secondary | ICD-10-CM | POA: Insufficient documentation

## 2018-07-08 DIAGNOSIS — I129 Hypertensive chronic kidney disease with stage 1 through stage 4 chronic kidney disease, or unspecified chronic kidney disease: Secondary | ICD-10-CM | POA: Diagnosis not present

## 2018-07-08 DIAGNOSIS — W01198A Fall on same level from slipping, tripping and stumbling with subsequent striking against other object, initial encounter: Secondary | ICD-10-CM | POA: Insufficient documentation

## 2018-07-08 MED ORDER — FENTANYL CITRATE (PF) 100 MCG/2ML IJ SOLN
25.0000 ug | Freq: Once | INTRAMUSCULAR | Status: AC
  Administered 2018-07-08: 25 ug via INTRAVENOUS
  Filled 2018-07-08: qty 2

## 2018-07-08 MED ORDER — ACETAMINOPHEN 325 MG PO TABS
650.0000 mg | ORAL_TABLET | Freq: Once | ORAL | Status: DC
  Filled 2018-07-08: qty 2

## 2018-07-08 MED ORDER — TETANUS-DIPHTH-ACELL PERTUSSIS 5-2.5-18.5 LF-MCG/0.5 IM SUSP
0.5000 mL | Freq: Once | INTRAMUSCULAR | Status: AC
  Administered 2018-07-08: 0.5 mL via INTRAMUSCULAR
  Filled 2018-07-08: qty 0.5

## 2018-07-08 NOTE — ED Provider Notes (Signed)
[facility]-EMERGENCY DEPT Provider Note   CSN: [id number] Arrival date & time: 07/08/18  2233    History   Chief Complaint Chief Complaint  Patient presents with   Fall    HPI Paula Bird is a 85 y.o. female past medical history of myasthenia gravis, hypertension, GERD, COPD, CKD paroxysmal A. fib, presenting to the emergency department via EMS from [facility] facility.  Patient had a mechanical fall this evening.  She states her refrigerator was leaking and she bent over to put a towel on the ground, however when she went to stand up she lost her balance falling backward into the wall.  She states she slid herself down the wall, however must have cut her head because she noticed bleeding.  No LOC.  She has chronic back pain and states it is not much worse than her baseline.  She does endorse left-sided neck pain that appears to be worse than her baseline.  C-collar is in place per EMS.  No medications given in route.  Patient is not on anticoagulation.  She denies vision changes, nausea, chest or abdominal pain, or pain to her extremities.    The history is provided by the patient.    Past Medical History:  Diagnosis Date   Acute and chronic respiratory failure, unspecified whether with hypoxia or hypercapnia (HCC)    Afib (HCC)    Allergic rhinitis    Anemia    CKD (chronic kidney disease)    Constipation    COPD (chronic obstructive pulmonary disease) (HCC)    Depression    Diplopia    Dysphagia    Edema, lower extremity    Esophageal dysmotility    Gait instability    GERD (gastroesophageal reflux disease)    Hypertension    Hypothyroidism    IBS (irritable bowel syndrome)    IDA (iron deficiency anemia)    Insomnia    Leukocytosis    Myasthenia gravis (HCC)    Oral thrush    PAF (paroxysmal atrial fibrillation) (HCC)    Physical deconditioning    Pneumonia 02/2017   Protein calorie  malnutrition (HCC)    Thyroid disease    Thyroid mass    Vertigo     Patient Active Problem List   Diagnosis Date Noted   Closed fracture of left ankle    Closed fracture of right ankle    Fibula fracture 10/19/2017   Avulsion fracture of medial malleolus of left tibia 10/19/2017   UTI (urinary tract infection) 07/17/2017   Infection due to parainfluenza virus 4 03/06/2017   Gait abnormality 06/07/2016   Hyperthyroidism 04/24/2016   Myasthenia gravis (HCC) 02/07/2016   Ptosis of eyelid, right 01/31/2016   Pain, eye, right 01/31/2016   Falling 01/31/2016   Vertigo 10/08/2015   Colon polyp 08/30/2015   Arthritis, degenerative 08/30/2015   Acid indigestion 08/30/2015   Psoriasis 08/30/2015   Avitaminosis D 08/30/2015   HCAP (healthcare-associated pneumonia) 08/08/2015   Respiratory distress 08/08/2015   Essential hypertension 08/08/2015   COPD exacerbation (HCC) 08/08/2015   Dyspnea 08/04/2015   Hypertension 08/04/2015   Depression 08/04/2015   Insomnia 08/04/2015   Obstructive chronic bronchitis with acute exacerbation (HCC) 08/04/2015   Thrombocytopenia (HCC) 08/04/2015   Paroxysmal atrial fibrillation (HCC) 08/04/2015   Acute exacerbation of chronic obstructive bronchitis (HCC) 08/04/2015   Infection of urinary tract 07/01/2014   Bradycardia 11/24/2013   Fall in home 11/22/2013   Pulmonary hypertension (HCC) 11/19/2013  Excessive falling 09/07/2012   Encounter for general adult medical examination without abnormal findings 03/24/2012   Atrial paroxysmal tachycardia (HCC) 10/05/2011   Family history of colon cancer 05/15/2011   Mass of pelvis 05/15/2011    Past Surgical History:  Procedure Laterality Date   ABDOMINAL HYSTERECTOMY     APPENDECTOMY     ESOPHAGOGASTRODUODENOSCOPY (EGD) WITH PROPOFOL N/A 07/21/2017   Procedure: ESOPHAGOGASTRODUODENOSCOPY (EGD) WITH PROPOFOL;  Surgeon: Kerin Salen, MD;  Location: [facility]  ENDOSCOPY;  Service: Gastroenterology;  Laterality: N/A;   TOTAL HIP ARTHROPLASTY Left    TOTAL KNEE ARTHROPLASTY Left      OB History   No obstetric history on file.      Home Medications    Prior to Admission medications   Medication Sig Start Date End Date Taking? Authorizing Provider  amiodarone (PACERONE) 200 MG tablet Take 200 mg by mouth daily. 06/07/17   [provider]  amitriptyline (ELAVIL) 50 MG tablet Take 50 mg by mouth at bedtime. 06/22/17   [provider]  artificial tears (LACRILUBE) OINT ophthalmic ointment Place 1 application into both eyes at bedtime.    [provider]  aspirin EC 81 MG tablet Take 81 mg by mouth daily.    [provider]  bisacodyl (DULCOLAX) 10 MG suppository Place 10 mg rectally daily as needed for moderate constipation.    [provider]  citalopram (CELEXA) 10 MG tablet Take 10 mg by mouth daily.    [provider]  diltiazem (CARDIZEM LA) 300 MG 24 hr tablet Take 300 mg by mouth daily. 03/04/16   [provider]  esomeprazole (NEXIUM) 40 MG packet Take 40 mg by mouth daily before breakfast.    [provider]  HYDROcodone-acetaminophen (NORCO/VICODIN) 5-325 MG tablet Take 1 tablet by mouth every 4 (four) hours as needed for moderate pain.     [provider]  hydroxypropyl methylcellulose / hypromellose (ISOPTO TEARS / GONIOVISC) 2.5 % ophthalmic solution Place 1 drop into both eyes 3 (three) times daily.    [provider]  INCRUSE ELLIPTA 62.5 MCG/INH AEPB Take 1 puff by mouth daily. 06/10/17   [provider]  lisinopril (PRINIVIL,ZESTRIL) 40 MG tablet Take 40 mg by mouth daily. 03/04/16   [provider]  METHIMAZOLE PO Take 7.5 mg by mouth daily.    [provider]  mycophenolate (CELLCEPT) 500 MG tablet Take 1 tablet (500 mg total) by mouth 2 (two) times daily. 01/09/18   Levert Feinstein, MD  ondansetron (ZOFRAN) 4 MG tablet  Take 4 mg by mouth every 8 (eight) hours as needed for nausea or vomiting.    [provider]  polyethylene glycol (MIRALAX / GLYCOLAX) packet Take 17 g by mouth daily. Patient taking differently: Take 17 g by mouth 2 (two) times daily.  10/22/17   Lenox Ponds, MD  sucralfate (CARAFATE) 1 GM/10ML suspension Take 10 mLs (1 g total) by mouth 4 (four) times daily -  with meals and at bedtime. 07/23/17   Kathlen Mody, MD  TRAZODONE HCL PO Take 25 mg by mouth 2 (two) times daily as needed (anxiety).    [provider]  zolpidem (AMBIEN) 10 MG tablet Take 10 mg by mouth at bedtime.     [provider]    Family History Family History  Problem Relation Age of Onset   Hypertension Mother    Hypertension Son    Diabetes Neg Hx    Thyroid disease Neg Hx  Social History Social History   Tobacco Use   Smoking status: Never Smoker   Smokeless tobacco: Never Used  Substance Use Topics   Alcohol use: No   Drug use: No     Allergies   Patient has no known allergies.   Review of Systems Review of Systems  Eyes: Negative for visual disturbance.  Cardiovascular: Negative for chest pain.  Gastrointestinal: Negative for abdominal pain, nausea and vomiting.  Musculoskeletal: Positive for back pain and neck pain. Negative for arthralgias and myalgias.  Skin: Positive for wound.  Neurological: Positive for headaches. Negative for syncope and numbness.  Hematological: Does not bruise/bleed easily.  Psychiatric/Behavioral: Negative for confusion.  All other systems reviewed and are negative.    Physical Exam Updated Vital Signs BP (!) 139/56    Pulse 66    Temp 97.6 F (36.4 C) (Axillary)    Resp 17    SpO2 100%   Physical Exam Vitals signs and nursing note reviewed.  Constitutional:      Appearance: She is well-developed.  HENT:     Head: Normocephalic.     Comments: Linear laceration to left crown of the scalp. Laceration is about 8cm in  length, not actively bleeding. Hematoma is present. No crepitus. Mild-moderate tenderness. No battle sign or racoon eyes. No facial trauma Eyes:     Extraocular Movements: Extraocular movements intact.     Conjunctiva/sclera: Conjunctivae normal.     Pupils: Pupils are equal, round, and reactive to light.  Cardiovascular:     Rate and Rhythm: Normal rate and regular rhythm.  Pulmonary:     Effort: Pulmonary effort is normal. No respiratory distress.     Breath sounds: Normal breath sounds.  Chest:     Chest wall: No tenderness.  Abdominal:     General: Bowel sounds are normal. There is no distension.     Palpations: Abdomen is soft.     Tenderness: There is no guarding or rebound.  Skin:    General: Skin is warm.  Neurological:     Mental Status: She is alert. Mental status is at baseline.     Comments: Mental Status:  Alert, oriented, thought content appropriate, able to give a coherent history. Speech fluent without evidence of aphasia. Able to follow 2 step commands without difficulty.  Cranial Nerves:  II:  Peripheral visual fields grossly normal, pupils equal, round, reactive to light III,IV, VI: ptosis not present, extra-ocular motions intact bilaterally  V,VII: smile symmetric, facial light touch sensation equal VIII: hearing grossly normal to voice  X: uvula elevates symmetrically  XI: bilateral shoulder shrug symmetric and strong XII: midline tongue extension without fassiculations Motor:  Normal tone. 5/5 in upper and lower extremities bilaterally including strong and equal grip strength and dorsiflexion/plantar flexion Sensory: Pinprick and light touch normal in all extremities.  Cerebellar: normal finger-to-nose with bilateral upper extremities CV: distal pulses palpable throughout    Psychiatric:        Behavior: Behavior normal.      ED Treatments / Results  Labs (all labs ordered are listed, but only abnormal results are displayed) Labs Reviewed - No data  to display  EKG None  Radiology Ct Head Wo Contrast  Result Date: 07/09/2018 CLINICAL DATA:  85 y/o F; found on floor due to mechanical fall. Laceration to the top of the head. Cervical, head, and thoracic pain. EXAM: CT HEAD WITHOUT CONTRAST CT CERVICAL SPINE WITHOUT CONTRAST CT THORACIC SPINE WITHOUT CONTRAST TECHNIQUE: Multidetector CT imaging of the  head and cervical spine was performed following the standard protocol without intravenous contrast. Multiplanar CT image reconstructions of the cervical spine were also generated. Multidetector CT images of the thoracic were obtained using the standard protocol without intravenous contrast. COMPARISON:  07/22/2017 CT cervical spine. 11/03/2016 CT head. 02/28/2016 chest CT. 01/08/2017 lumbar MRI. FINDINGS: CT HEAD FINDINGS Brain: No evidence of acute infarction, hemorrhage, hydrocephalus, extra-axial collection or mass lesion/mass effect. Stable 5 mm meningioma along the right anterior falx (series 6, image 13). Stable chronic microvascular ischemic changes and volume loss of the brain. Vascular: Calcific atherosclerosis of carotid siphons. No hyperdense vessel identified. Skull: Left parietal scalp contusion and laceration. No calvarial fracture. Sinuses/Orbits: No acute finding. Other: None. CT CERVICAL SPINE FINDINGS Alignment: Stable C6-7 grade 1 anterolisthesis. No acute subluxation. Skull base and vertebrae: No acute fracture. No primary bone lesion or focal pathologic process. Soft tissues and spinal canal: No prevertebral fluid or swelling. No visible canal hematoma. Disc levels: Stable spondylosis of the cervical spine prominent bilateral facet arthropathy. Intervertebral disc spaces are largely maintained. C7-T1 interbody fusion is stable. Uncovertebral and facet hypertrophy result in bony neural foraminal encroachment at the bilateral C3-4, right C4-5, bilateral C6-7 levels. No high-grade bony spinal canal stenosis. Upper chest: Negative. Other:  Heterogeneous enlarged thyroid gland. Calcific atherosclerosis of the carotid bifurcations. CT THORACIC SPINE FINDINGS Alignment: Exaggerated thoracic kyphosis. No listhesis. Vertebrae: Stable mild T8 compression deformity. Stable moderate T12 compression deformity post augmentation. Stable moderate to severe L1 compression deformity. Stable partially visualized L2 compression deformity post augmentation. Bones are demineralized. No acute fracture or loss of vertebral body height is evident. Paraspinal and other soft tissues: Increased density of the liver, probably related to amiodarone deposition. Calcific atherosclerosis of the aorta. Cardiomegaly and severe coronary artery calcific atherosclerosis. Disc levels: Moderate stable spondylosis of the thoracic spine. No high-grade bony spinal canal stenosis. IMPRESSION: CT head: 1. Left parietal scalp contusion and laceration. No calvarial fracture. 2. No acute intracranial abnormality. 3. Stable chronic microvascular ischemic changes and parenchymal volume loss of the brain. 4. Stable 5 mm meningioma along the right anterior falx. CT cervical spine: 1. No acute fracture or dislocation identified. 2. Stable spondylosis of the cervical spine multilevel foraminal stenosis. No high-grade bony canal stenosis. CT thoracic spine: 1. No acute fracture or dislocation identified. 2. Stable chronic compression deformities of T8, T12, L1, and L2. 3. Cardiomegaly, severe coronary artery calcification, Aortic Atherosclerosis (ICD10-I70.0). 4. Increased density of liver, probably amiodarone deposition. Electronically Signed   By: Mitzi HansenLance  Furusawa-Stratton M.D.   On: 07/09/2018 00:03   Ct Cervical Spine Wo Contrast  Result Date: 07/09/2018 CLINICAL DATA:  85 y/o F; found on floor due to mechanical fall. Laceration to the top of the head. Cervical, head, and thoracic pain. EXAM: CT HEAD WITHOUT CONTRAST CT CERVICAL SPINE WITHOUT CONTRAST CT THORACIC SPINE WITHOUT CONTRAST  TECHNIQUE: Multidetector CT imaging of the head and cervical spine was performed following the standard protocol without intravenous contrast. Multiplanar CT image reconstructions of the cervical spine were also generated. Multidetector CT images of the thoracic were obtained using the standard protocol without intravenous contrast. COMPARISON:  07/22/2017 CT cervical spine. 11/03/2016 CT head. 02/28/2016 chest CT. 01/08/2017 lumbar MRI. FINDINGS: CT HEAD FINDINGS Brain: No evidence of acute infarction, hemorrhage, hydrocephalus, extra-axial collection or mass lesion/mass effect. Stable 5 mm meningioma along the right anterior falx (series 6, image 13). Stable chronic microvascular ischemic changes and volume loss of the brain. Vascular: Calcific atherosclerosis of carotid siphons. No  hyperdense vessel identified. Skull: Left parietal scalp contusion and laceration. No calvarial fracture. Sinuses/Orbits: No acute finding. Other: None. CT CERVICAL SPINE FINDINGS Alignment: Stable C6-7 grade 1 anterolisthesis. No acute subluxation. Skull base and vertebrae: No acute fracture. No primary bone lesion or focal pathologic process. Soft tissues and spinal canal: No prevertebral fluid or swelling. No visible canal hematoma. Disc levels: Stable spondylosis of the cervical spine prominent bilateral facet arthropathy. Intervertebral disc spaces are largely maintained. C7-T1 interbody fusion is stable. Uncovertebral and facet hypertrophy result in bony neural foraminal encroachment at the bilateral C3-4, right C4-5, bilateral C6-7 levels. No high-grade bony spinal canal stenosis. Upper chest: Negative. Other: Heterogeneous enlarged thyroid gland. Calcific atherosclerosis of the carotid bifurcations. CT THORACIC SPINE FINDINGS Alignment: Exaggerated thoracic kyphosis. No listhesis. Vertebrae: Stable mild T8 compression deformity. Stable moderate T12 compression deformity post augmentation. Stable moderate to severe L1  compression deformity. Stable partially visualized L2 compression deformity post augmentation. Bones are demineralized. No acute fracture or loss of vertebral body height is evident. Paraspinal and other soft tissues: Increased density of the liver, probably related to amiodarone deposition. Calcific atherosclerosis of the aorta. Cardiomegaly and severe coronary artery calcific atherosclerosis. Disc levels: Moderate stable spondylosis of the thoracic spine. No high-grade bony spinal canal stenosis. IMPRESSION: CT head: 1. Left parietal scalp contusion and laceration. No calvarial fracture. 2. No acute intracranial abnormality. 3. Stable chronic microvascular ischemic changes and parenchymal volume loss of the brain. 4. Stable 5 mm meningioma along the right anterior falx. CT cervical spine: 1. No acute fracture or dislocation identified. 2. Stable spondylosis of the cervical spine multilevel foraminal stenosis. No high-grade bony canal stenosis. CT thoracic spine: 1. No acute fracture or dislocation identified. 2. Stable chronic compression deformities of T8, T12, L1, and L2. 3. Cardiomegaly, severe coronary artery calcification, Aortic Atherosclerosis (ICD10-I70.0). 4. Increased density of liver, probably amiodarone deposition. Electronically Signed   By: Mitzi Hansen M.D.   On: 07/09/2018 00:03   Ct Thoracic Spine Wo Contrast  Result Date: 07/09/2018 CLINICAL DATA:  85 y/o F; found on floor due to mechanical fall. Laceration to the top of the head. Cervical, head, and thoracic pain. EXAM: CT HEAD WITHOUT CONTRAST CT CERVICAL SPINE WITHOUT CONTRAST CT THORACIC SPINE WITHOUT CONTRAST TECHNIQUE: Multidetector CT imaging of the head and cervical spine was performed following the standard protocol without intravenous contrast. Multiplanar CT image reconstructions of the cervical spine were also generated. Multidetector CT images of the thoracic were obtained using the standard protocol without  intravenous contrast. COMPARISON:  07/22/2017 CT cervical spine. 11/03/2016 CT head. 02/28/2016 chest CT. 01/08/2017 lumbar MRI. FINDINGS: CT HEAD FINDINGS Brain: No evidence of acute infarction, hemorrhage, hydrocephalus, extra-axial collection or mass lesion/mass effect. Stable 5 mm meningioma along the right anterior falx (series 6, image 13). Stable chronic microvascular ischemic changes and volume loss of the brain. Vascular: Calcific atherosclerosis of carotid siphons. No hyperdense vessel identified. Skull: Left parietal scalp contusion and laceration. No calvarial fracture. Sinuses/Orbits: No acute finding. Other: None. CT CERVICAL SPINE FINDINGS Alignment: Stable C6-7 grade 1 anterolisthesis. No acute subluxation. Skull base and vertebrae: No acute fracture. No primary bone lesion or focal pathologic process. Soft tissues and spinal canal: No prevertebral fluid or swelling. No visible canal hematoma. Disc levels: Stable spondylosis of the cervical spine prominent bilateral facet arthropathy. Intervertebral disc spaces are largely maintained. C7-T1 interbody fusion is stable. Uncovertebral and facet hypertrophy result in bony neural foraminal encroachment at the bilateral C3-4, right C4-5, bilateral C6-7 levels.  No high-grade bony spinal canal stenosis. Upper chest: Negative. Other: Heterogeneous enlarged thyroid gland. Calcific atherosclerosis of the carotid bifurcations. CT THORACIC SPINE FINDINGS Alignment: Exaggerated thoracic kyphosis. No listhesis. Vertebrae: Stable mild T8 compression deformity. Stable moderate T12 compression deformity post augmentation. Stable moderate to severe L1 compression deformity. Stable partially visualized L2 compression deformity post augmentation. Bones are demineralized. No acute fracture or loss of vertebral body height is evident. Paraspinal and other soft tissues: Increased density of the liver, probably related to amiodarone deposition. Calcific atherosclerosis of  the aorta. Cardiomegaly and severe coronary artery calcific atherosclerosis. Disc levels: Moderate stable spondylosis of the thoracic spine. No high-grade bony spinal canal stenosis. IMPRESSION: CT head: 1. Left parietal scalp contusion and laceration. No calvarial fracture. 2. No acute intracranial abnormality. 3. Stable chronic microvascular ischemic changes and parenchymal volume loss of the brain. 4. Stable 5 mm meningioma along the right anterior falx. CT cervical spine: 1. No acute fracture or dislocation identified. 2. Stable spondylosis of the cervical spine multilevel foraminal stenosis. No high-grade bony canal stenosis. CT thoracic spine: 1. No acute fracture or dislocation identified. 2. Stable chronic compression deformities of T8, T12, L1, and L2. 3. Cardiomegaly, severe coronary artery calcification, Aortic Atherosclerosis (ICD10-I70.0). 4. Increased density of liver, probably amiodarone deposition. Electronically Signed   By: Mitzi Hansen M.D.   On: 07/09/2018 00:03    Procedures .Marland KitchenLaceration Repair Date/Time: 07/08/2018 11:06 PM Performed by: Daquann Merriott, [place] N, PA-C Authorized by: Tashanna Dolin, [place] N, PA-C   Consent:    Consent obtained:  Verbal   Consent given by:  Patient   Risks discussed:  Pain, poor cosmetic result and infection   Alternatives discussed:  No treatment Anesthesia (see MAR for exact dosages):    Anesthesia method:  Local infiltration   Local anesthetic:  Lidocaine 2% WITH epi Laceration details:    Location:  Scalp   Scalp location:  Crown   Length (cm):  8 Repair type:    Repair type:  Simple Pre-procedure details:    Preparation:  Patient was prepped and draped in usual sterile fashion Exploration:    Hemostasis achieved with:  Direct pressure   Wound exploration: entire depth of wound probed and visualized     Wound extent: no foreign bodies/material noted and no underlying fracture noted     Contaminated: no   Treatment:    Area  cleansed with:  Saline   Amount of cleaning:  Extensive   Visualized foreign bodies/material removed: no   Skin repair:    Repair method:  Staples   Number of staples:  10 Approximation:    Approximation:  Close Post-procedure details:    Dressing:  Open (no dressing)   Patient tolerance of procedure:  Tolerated well, no immediate complications   (including critical care time)  Medications Ordered in ED Medications  fentaNYL (SUBLIMAZE) injection 25 mcg (25 mcg Intravenous Given 07/08/18 2341)  Tdap (BOOSTRIX) injection 0.5 mL (0.5 mLs Intramuscular Given 07/08/18 2341)  lidocaine-EPINEPHrine (XYLOCAINE W/EPI) 2 %-1:200000 (PF) injection 10 mL (10 mLs Infiltration Given by Other 07/09/18 0124)  fentaNYL (SUBLIMAZE) injection 50 mcg (50 mcg Intravenous Given 07/09/18 0027)  HYDROcodone-acetaminophen (NORCO/VICODIN) 5-325 MG per tablet 1 tablet (1 tablet Oral Given 07/09/18 0125)     Initial Impression / Assessment and Plan / ED Course  I have reviewed the triage vital signs and the nursing notes.  Pertinent labs & imaging results that were available during my care of the patient were reviewed by me and considered  in my medical decision making (see chart for details).  Clinical Course as of Jul 09 146  Wed Jul 09, 2018  0020 Scans are negative for acute pathology.  C-collar removed, patient is able to range neck without significant pain.  Is complaining of persistent pain to her scalp.  Will re-dose fentanyl and proceed with laceration repair.   [JR]    Clinical Course User Index [JR] Abed Schar, [place] N, PA-C       Patient presenting after mechanical fall with large laceration to her scalp and acute on chronic neck and thoracic back pain.  No LOC.  Patient is alert and oriented, at her baseline.  Not on anticoagulation.  No focal neuro deficits on exam.  CT of head, C-spine and T-spine are without acute findings today.  Wound was irrigated and closed with staples.  Tdap updated.   Pain managed and controlled in the ED.  Will discharge at this time with symptomatic management, instructions for close PCP follow-up, and return precautions.  Patient evaluated by Dr. Preston Fleeting.  Discussed results, findings, treatment and follow up. Patient advised of return precautions. Patient verbalized understanding and agreed with plan.   Final Clinical Impressions(s) / ED Diagnoses   Final diagnoses:  Fall, initial encounter  Laceration of scalp, initial encounter  Neck pain  Midline thoracic back pain, unspecified chronicity    ED Discharge Orders    None       Nissi Doffing, [place] N, PA-C 07/09/18 0148    Dione Booze, MD 07/09/18 [phone]

## 2018-07-08 NOTE — ED Triage Notes (Signed)
Pt transported from [facility] facility.  Staff found pt on the floor tonight d/t mechanical fall.  Lac noted on top of her head.  Pt reports she was picking something up from the floor when she lost her balance and fell back.  - LOC.  C-collar in place per EMS.  She reports cervical, head, thoracic pain.  She also has chronic back and neck pain.  Not on blood thinner, other than ASA.

## 2018-07-08 NOTE — ED Notes (Signed)
Bed: WA08 Expected date:  Expected time:  Means of arrival:  Comments: EMS-fall 

## 2018-07-08 NOTE — ED Notes (Signed)
Patient transported to CT 

## 2018-07-09 MED ORDER — FENTANYL CITRATE (PF) 100 MCG/2ML IJ SOLN
50.0000 ug | Freq: Once | INTRAMUSCULAR | Status: AC
  Administered 2018-07-09: 50 ug via INTRAVENOUS
  Filled 2018-07-09: qty 2

## 2018-07-09 MED ORDER — LIDOCAINE-EPINEPHRINE (PF) 2 %-1:200000 IJ SOLN
10.0000 mL | Freq: Once | INTRAMUSCULAR | Status: AC
  Administered 2018-07-09: 10 mL
  Filled 2018-07-09: qty 10

## 2018-07-09 MED ORDER — HYDROCODONE-ACETAMINOPHEN 5-325 MG PO TABS
1.0000 | ORAL_TABLET | Freq: Once | ORAL | Status: AC
  Administered 2018-07-09: 1 via ORAL
  Filled 2018-07-09: qty 1

## 2018-07-09 NOTE — ED Notes (Signed)
Attempted to call [facility] without answer.

## 2018-07-09 NOTE — ED Notes (Signed)
PTAR at bedside 

## 2018-07-09 NOTE — ED Notes (Signed)
Dispatch contacted for transport back to [place].

## 2018-07-09 NOTE — Discharge Instructions (Addendum)
Your CT scans of your head, neck, and upper back are not showing and new injuries from your fall today. Please read instructions below. Apply ice to your areas of pain for 20 minutes at a time. You can take your prescribed hydrocodone as directed as needed for pain. Keep your wound clean.  Follow up with your primary care or urgent care for wound recheck and staple removal in 7 days.  Return to the ER for fever, pus draining from wound, redness, severely worsening headache, vision changes, if new numbness or tingling in your arms or legs, inability to urinate, inability to hold your bowels, or weakness in your extremities.

## 2018-08-25 ENCOUNTER — Encounter (HOSPITAL_COMMUNITY): Payer: Self-pay

## 2018-08-25 ENCOUNTER — Emergency Department (HOSPITAL_COMMUNITY): Payer: Medicare Other

## 2018-08-25 ENCOUNTER — Emergency Department (HOSPITAL_COMMUNITY)
Admission: EM | Admit: 2018-08-25 | Discharge: 2018-08-25 | Disposition: A | Discharge status: Other Acute Inpatient Hospital | Payer: Medicare Other | Attending: Emergency Medicine | Admitting: Emergency Medicine

## 2018-08-25 ENCOUNTER — Other Ambulatory Visit: Payer: Self-pay

## 2018-08-25 DIAGNOSIS — N189 Chronic kidney disease, unspecified: Secondary | ICD-10-CM | POA: Diagnosis not present

## 2018-08-25 DIAGNOSIS — Z96652 Presence of left artificial knee joint: Secondary | ICD-10-CM | POA: Diagnosis not present

## 2018-08-25 DIAGNOSIS — R0602 Shortness of breath: Secondary | ICD-10-CM | POA: Diagnosis present

## 2018-08-25 DIAGNOSIS — Z20828 Contact with and (suspected) exposure to other viral communicable diseases: Secondary | ICD-10-CM | POA: Diagnosis not present

## 2018-08-25 DIAGNOSIS — R6883 Chills (without fever): Secondary | ICD-10-CM | POA: Diagnosis not present

## 2018-08-25 DIAGNOSIS — Z79899 Other long term (current) drug therapy: Secondary | ICD-10-CM | POA: Diagnosis not present

## 2018-08-25 DIAGNOSIS — Z96642 Presence of left artificial hip joint: Secondary | ICD-10-CM | POA: Insufficient documentation

## 2018-08-25 DIAGNOSIS — Z7982 Long term (current) use of aspirin: Secondary | ICD-10-CM | POA: Insufficient documentation

## 2018-08-25 DIAGNOSIS — I129 Hypertensive chronic kidney disease with stage 1 through stage 4 chronic kidney disease, or unspecified chronic kidney disease: Secondary | ICD-10-CM | POA: Insufficient documentation

## 2018-08-25 DIAGNOSIS — E039 Hypothyroidism, unspecified: Secondary | ICD-10-CM | POA: Insufficient documentation

## 2018-08-25 LAB — URINALYSIS, ROUTINE W REFLEX MICROSCOPIC
Bilirubin Urine: NEGATIVE
Glucose, UA: NEGATIVE mg/dL
Hgb urine dipstick: NEGATIVE
Ketones, ur: NEGATIVE mg/dL
Nitrite: NEGATIVE
Protein, ur: NEGATIVE mg/dL
Specific Gravity, Urine: 1.011 (ref 1.005–1.030)
pH: 7 (ref 5.0–8.0)

## 2018-08-25 LAB — CBC WITH DIFFERENTIAL/PLATELET
Abs Immature Granulocytes: 0.04 10*3/uL (ref 0.00–0.07)
Basophils Absolute: 0 10*3/uL (ref 0.0–0.1)
Basophils Relative: 1 %
Eosinophils Absolute: 0.2 10*3/uL (ref 0.0–0.5)
Eosinophils Relative: 3 %
HCT: 33.9 % — ABNORMAL LOW (ref 36.0–46.0)
Hemoglobin: 9.7 g/dL — ABNORMAL LOW (ref 12.0–15.0)
Immature Granulocytes: 1 %
Lymphocytes Relative: 19 %
Lymphs Abs: 1.1 10*3/uL (ref 0.7–4.0)
MCH: 21.1 pg — ABNORMAL LOW (ref 26.0–34.0)
MCHC: 28.6 g/dL — ABNORMAL LOW (ref 30.0–36.0)
MCV: 73.9 fL — ABNORMAL LOW (ref 80.0–100.0)
Monocytes Absolute: 0.7 10*3/uL (ref 0.1–1.0)
Monocytes Relative: 12 %
Neutro Abs: 3.7 10*3/uL (ref 1.7–7.7)
Neutrophils Relative %: 64 %
Platelets: 130 10*3/uL — ABNORMAL LOW (ref 150–400)
RBC: 4.59 MIL/uL (ref 3.87–5.11)
RDW: 17.1 % — ABNORMAL HIGH (ref 11.5–15.5)
WBC: 5.6 10*3/uL (ref 4.0–10.5)
nRBC: 0 % (ref 0.0–0.2)

## 2018-08-25 LAB — BASIC METABOLIC PANEL
Anion gap: 8 (ref 5–15)
BUN: 19 mg/dL (ref 8–23)
CO2: 22 mmol/L (ref 22–32)
Calcium: 8.6 mg/dL — ABNORMAL LOW (ref 8.9–10.3)
Chloride: 110 mmol/L (ref 98–111)
Creatinine, Ser: 0.41 mg/dL — ABNORMAL LOW (ref 0.44–1.00)
GFR calc Af Amer: >60 mL/min (ref >60)
GFR calc non Af Amer: >60 mL/min (ref >60)
Glucose, Bld: 90 mg/dL (ref 70–99)
Potassium: 3.8 mmol/L (ref 3.5–5.1)
Sodium: 140 mmol/L (ref 135–145)

## 2018-08-25 LAB — SARS CORONAVIRUS 2 BY RT PCR (HOSPITAL ORDER, PERFORMED IN ~~LOC~~ HOSPITAL LAB): SARS Coronavirus 2: NEGATIVE

## 2018-08-25 LAB — LACTIC ACID, PLASMA: Lactic Acid, Venous: 1.2 mmol/L (ref 0.5–1.9)

## 2018-08-25 MED ORDER — SODIUM CHLORIDE 0.9 % IV BOLUS
500.0000 mL | Freq: Once | INTRAVENOUS | Status: AC
  Administered 2018-08-25: 500 mL via INTRAVENOUS

## 2018-08-25 NOTE — ED Provider Notes (Signed)
[facility]-EMERGENCY DEPT Provider Note   CSN: [id number] Arrival date & time: 08/25/18  1016    History   Chief Complaint Chief Complaint  Patient presents with  . Shortness of Breath    HPI Paula Bird is a 85 y.o. female.     HPI   She presents for evaluation of chills, on and off for 3 days.  She had an exposure to a caregiver who tested positive for Covid-19, 4 days ago.  She lives in an assisted living facility.  She does not know if her temperature is been elevated.  She denies change in chronic cough, sputum production, nausea, vomiting, dysuria, weakness or dizziness.  She ate this morning but did not take her morning medications.  There are no other known modifying factors.  Past Medical History:  Diagnosis Date  . Acute and chronic respiratory failure, unspecified whether with hypoxia or hypercapnia (HCC)   . Afib (HCC)   . Allergic rhinitis   . Anemia   . CKD (chronic kidney disease)   . Constipation   . COPD (chronic obstructive pulmonary disease) (HCC)   . Depression   . Diplopia   . Dysphagia   . Edema, lower extremity   . Esophageal dysmotility   . Gait instability   . GERD (gastroesophageal reflux disease)   . Hypertension   . Hypothyroidism   . IBS (irritable bowel syndrome)   . IDA (iron deficiency anemia)   . Insomnia   . Leukocytosis   . Myasthenia gravis (HCC)   . Oral thrush   . PAF (paroxysmal atrial fibrillation) (HCC)   . Physical deconditioning   . Pneumonia 02/2017  . Protein calorie malnutrition (HCC)   . Thyroid disease   . Thyroid mass   . Vertigo     Patient Active Problem List   Diagnosis Date Noted  . Closed fracture of left ankle   . Closed fracture of right ankle   . Fibula fracture 10/19/2017  . Avulsion fracture of medial malleolus of left tibia 10/19/2017  . UTI (urinary tract infection) 07/17/2017  . Infection due to parainfluenza virus 4 03/06/2017  . Gait abnormality 06/07/2016  .  Hyperthyroidism 04/24/2016  . Myasthenia gravis (HCC) 02/07/2016  . Ptosis of eyelid, right 01/31/2016  . Pain, eye, right 01/31/2016  . Falling 01/31/2016  . Vertigo 10/08/2015  . Colon polyp 08/30/2015  . Arthritis, degenerative 08/30/2015  . Acid indigestion 08/30/2015  . Psoriasis 08/30/2015  . Avitaminosis D 08/30/2015  . HCAP (healthcare-associated pneumonia) 08/08/2015  . Respiratory distress 08/08/2015  . Essential hypertension 08/08/2015  . COPD exacerbation (HCC) 08/08/2015  . Dyspnea 08/04/2015  . Hypertension 08/04/2015  . Depression 08/04/2015  . Insomnia 08/04/2015  . Obstructive chronic bronchitis with acute exacerbation (HCC) 08/04/2015  . Thrombocytopenia (HCC) 08/04/2015  . Paroxysmal atrial fibrillation (HCC) 08/04/2015  . Acute exacerbation of chronic obstructive bronchitis (HCC) 08/04/2015  . Infection of urinary tract 07/01/2014  . Bradycardia 11/24/2013  . Fall in home 11/22/2013  . Pulmonary hypertension (HCC) 11/19/2013  . Excessive falling 09/07/2012  . Encounter for general adult medical examination without abnormal findings 03/24/2012  . Atrial paroxysmal tachycardia (HCC) 10/05/2011  . Family history of colon cancer 05/15/2011  . Mass of pelvis 05/15/2011    Past Surgical History:  Procedure Laterality Date  . ABDOMINAL HYSTERECTOMY    . APPENDECTOMY    . ESOPHAGOGASTRODUODENOSCOPY (EGD) WITH PROPOFOL N/A 07/21/2017   Procedure: ESOPHAGOGASTRODUODENOSCOPY (EGD) WITH PROPOFOL;  Surgeon: Kerin SalenKarki, Arya, MD;  Location: [facility];  Service: Gastroenterology;  Laterality: N/A;  . TOTAL HIP ARTHROPLASTY Left   . TOTAL KNEE ARTHROPLASTY Left      OB History   No obstetric history on file.      Home Medications    Prior to Admission medications   Medication Sig Start Date End Date Taking? Authorizing Provider  amiodarone (PACERONE) 200 MG tablet Take 200 mg by mouth daily. 06/07/17  Yes [provider]  amitriptyline (ELAVIL) 50 MG  tablet Take 50 mg by mouth at bedtime. 06/22/17  Yes [provider]  artificial tears (LACRILUBE) OINT ophthalmic ointment Place 1 application into both eyes 4 (four) times daily.    Yes [provider]  aspirin EC 81 MG tablet Take 81 mg by mouth daily.   Yes [provider]  bisacodyl (DULCOLAX) 10 MG suppository Place 10 mg rectally daily as needed for moderate constipation.   Yes [provider]  BREO ELLIPTA 100-25 MCG/INH AEPB Take 1 puff by mouth daily. 06/16/18  Yes [provider]  citalopram (CELEXA) 10 MG tablet Take 10 mg by mouth daily.   Yes [provider]  diltiazem (CARDIZEM LA) 300 MG 24 hr tablet Take 300 mg by mouth daily. 03/04/16  Yes [provider]  esomeprazole (NEXIUM) 40 MG capsule Take 40 mg by mouth daily. 06/11/18  Yes [provider]  HYDROcodone-acetaminophen (NORCO/VICODIN) 5-325 MG tablet Take 1 tablet by mouth every 4 (four) hours as needed for moderate pain.    Yes [provider]  INCRUSE ELLIPTA 62.5 MCG/INH AEPB Take 1 puff by mouth daily. 06/10/17  Yes [provider]  lisinopril (PRINIVIL,ZESTRIL) 40 MG tablet Take 40 mg by mouth daily. 03/04/16  Yes [provider]  methimazole (TAPAZOLE) 5 MG tablet Take 7.5 mg by mouth daily. 06/16/18  Yes [provider]  Multiple Vitamin (MULTIVITAMIN WITH MINERALS) TABS tablet Take 1 tablet by mouth daily.   Yes [provider]  mycophenolate (CELLCEPT) 500 MG tablet Take 1 tablet (500 mg total) by mouth 2 (two) times daily. 01/09/18  Yes Levert Feinstein, MD  ondansetron (ZOFRAN) 4 MG tablet Take 4 mg by mouth every 8 (eight) hours as needed for nausea or vomiting.   Yes [provider]  polyethylene glycol (MIRALAX / GLYCOLAX) packet Take 17 g by mouth daily. Patient taking differently: Take 17 g by mouth 2 (two) times daily.  10/22/17  Yes Randel Pigg, Dorma Russell, MD  sucralfate (CARAFATE) 1 g tablet Take 1  g by mouth 4 (four) times daily. 06/16/18  Yes [provider]  SUMAtriptan (IMITREX) 25 MG tablet Take 25 mg by mouth daily as needed for migraine. May repeat in 2 hours if headache persists or recurs.   Yes [provider]  traZODone (DESYREL) 50 MG tablet Take 25 mg by mouth every 12 (twelve) hours as needed for anxiety. 06/05/18  Yes [provider]  zolpidem (AMBIEN) 10 MG tablet Take 10 mg by mouth at bedtime.    Yes [provider]  sucralfate (CARAFATE) 1 GM/10ML suspension Take 10 mLs (1 g total) by mouth 4 (four) times daily -  with meals and at bedtime. Patient not taking: Reported on 08/25/2018 07/23/17   Kathlen Mody, MD    Family History Family History  Problem Relation Age of Onset  . Hypertension Mother   . Hypertension Son   . Diabetes Neg Hx   . Thyroid disease Neg Hx     Social History  Social History   Tobacco Use  . Smoking status: Never Smoker  . Smokeless tobacco: Never Used  Substance Use Topics  . Alcohol use: No  . Drug use: No     Allergies   Patient has no known allergies.   Review of Systems Review of Systems  All other systems reviewed and are negative.    Physical Exam Updated Vital Signs BP 138/82   Pulse 82   Temp 98.3 F (36.8 C) (Oral)   Resp 15   Ht 5' (1.524 m)   Wt 64.9 kg   SpO2 99%   BMI 27.93 kg/m   Physical Exam Vitals signs and nursing note reviewed.  Constitutional:      General: She is not in acute distress.    Appearance: She is well-developed. She is not ill-appearing, toxic-appearing or diaphoretic.  HENT:     Head: Normocephalic and atraumatic.     Right Ear: External ear normal.     Left Ear: External ear normal.  Eyes:     Conjunctiva/sclera: Conjunctivae normal.     Pupils: Pupils are equal, round, and reactive to light.  Neck:     Musculoskeletal: Normal range of motion and neck supple.     Trachea: Phonation normal.  Cardiovascular:     Rate and Rhythm: Normal rate  and regular rhythm.     Heart sounds: Normal heart sounds.  Pulmonary:     Effort: Pulmonary effort is normal. No respiratory distress.     Breath sounds: Normal breath sounds. No stridor. No wheezing or rhonchi.  Abdominal:     Palpations: Abdomen is soft.     Tenderness: There is no abdominal tenderness.  Musculoskeletal: Normal range of motion.        General: No swelling, tenderness or deformity.  Skin:    General: Skin is warm and dry.  Neurological:     Mental Status: She is alert and oriented to person, place, and time.     Cranial Nerves: No cranial nerve deficit.     Sensory: No sensory deficit.     Motor: No abnormal muscle tone.     Coordination: Coordination normal.  Psychiatric:        Mood and Affect: Mood normal.        Behavior: Behavior normal.        Thought Content: Thought content normal.        Judgment: Judgment normal.      ED Treatments / Results  Labs (all labs ordered are listed, but only abnormal results are displayed) Labs Reviewed  BASIC METABOLIC PANEL - Abnormal; Notable for the following components:      Result Value   Creatinine, Ser 0.41 (*)    Calcium 8.6 (*)    All other components within normal limits  CBC WITH DIFFERENTIAL/PLATELET - Abnormal; Notable for the following components:   Hemoglobin 9.7 (*)    HCT 33.9 (*)    MCV 73.9 (*)    MCH 21.1 (*)    MCHC 28.6 (*)    RDW 17.1 (*)    Platelets 130 (*)    All other components within normal limits  URINALYSIS, ROUTINE W REFLEX MICROSCOPIC - Abnormal; Notable for the following components:   Leukocytes,Ua TRACE (*)    Bacteria, UA RARE (*)    All other components within normal limits  SARS CORONAVIRUS 2 (HOSPITAL ORDER, PERFORMED IN  HOSPITAL LAB)  CULTURE, BLOOD (ROUTINE X 2)  CULTURE, BLOOD (ROUTINE X 2)  LACTIC  ACID, PLASMA  LACTIC ACID, PLASMA    EKG None  Radiology Dg Chest Port 1 View  Result Date: 08/25/2018 CLINICAL DATA:  Shortness of breath and chills  for the past 2 days. EXAM: PORTABLE CHEST 1 VIEW COMPARISON:  Chest x-ray dated July 17, 2017. FINDINGS: Stable cardiomegaly. Atherosclerotic calcification of the aortic arch. Unchanged tortuosity of the brachiocephalic artery. Normal pulmonary vascularity. Unchanged left basilar scarring. No focal consolidation, pleural effusion, or pneumothorax. No acute osseous abnormality. IMPRESSION: No active disease. Electronically Signed   By: Obie Dredge M.D.   On: 08/25/2018 12:11    Procedures Procedures (including critical care time)  Medications Ordered in ED Medications  sodium chloride 0.9 % bolus 500 mL (0 mLs Intravenous Stopped 08/25/18 1252)     Initial Impression / Assessment and Plan / ED Course  I have reviewed the triage vital signs and the nursing notes.  Pertinent labs & imaging results that were available during my care of the patient were reviewed by me and considered in my medical decision making (see chart for details).  Clinical Course as of Aug 24 1520  Mon Aug 25, 2018  1518 Normal except increased leukocytes and rare bacteria  Urinalysis, Routine w reflex microscopic(!) [EW]  1518 Normal  SARS Coronavirus 2 (CEPHEID- Performed in [facility] hospital lab), [facility] Order [EW]  1518 Normal except hemoglobin low, MCV low, RBC indices low, platelets are low  CBC with Differential(!) [EW]  1518 No history of CHF, images reviewed by me  DG Chest [place] 1 View [EW]    Clinical Course User Index [EW] Mancel Bale, MD        Patient Vitals for the past 24 hrs:  BP Temp Temp src Pulse Resp SpO2 Height Weight  08/25/18 1500 138/82 - - 82 15 99 % - -  08/25/18 1430 (!) 154/71 - - 80 12 99 % - -  08/25/18 1400 (!) 156/67 - - 77 (!) 23 97 % - -  08/25/18 1330 (!) 151/70 - - 77 (!) 25 99 % - -  08/25/18 1300 (!) 157/67 - - 74 (!) 21 98 % - -  08/25/18 1230 (!) 167/80 - - 71 18 97 % - -  08/25/18 1045 - - - 70 15 98 % - -  08/25/18 1035 - - - - - - 5' (1.524 m) 64.9 kg   08/25/18 1034 (!) 113/97 98.3 F (36.8 C) Oral 70 17 99 % - -    3:19 PM Reevaluation with update and discussion. After initial assessment and treatment, an updated evaluation reveals no change in clinical status.  Findings discussed with the patient and all questions were answered. Mancel Bale   Medical Decision Making: Nonspecific chills, with COVID-19 exposure.  Screening for COVID negative.  Doubt ACS, PE, serious bacterial infection or metabolic instability.  CRITICAL CARE-no Performed by: Mancel Bale  Nursing Notes Reviewed/ Care Coordinated Applicable Imaging Reviewed Interpretation of Laboratory Data incorporated into ED treatment  The patient appears reasonably screened and/or stabilized for discharge and I doubt any other medical condition or other [facility] requiring further screening, evaluation, or treatment in the ED at this time prior to discharge.  Plan: Home Medications-usual; Home Treatments-rest, fluids; return here if the recommended treatment, does not improve the symptoms; Recommended follow up-PCP, and   Final Clinical Impressions(s) / ED Diagnoses   Final diagnoses:  Chills    ED Discharge Orders    None       Mancel Bale, MD  08/25/18 1523  

## 2018-08-25 NOTE — ED Notes (Signed)
Bed: IT25 Expected date:  Expected time:  Means of arrival:  Comments: EMS 85 yo F pain

## 2018-08-25 NOTE — Discharge Instructions (Addendum)
See your doctor for problems. °

## 2018-08-25 NOTE — ED Notes (Signed)
Provided pt with a [place] sandwich and diet soda

## 2018-08-25 NOTE — ED Notes (Signed)
Report called to [facility], PTAR notified of need for pt transport

## 2018-08-25 NOTE — ED Triage Notes (Signed)
Pt c/o [facility]OB for 2 days, chills, no fever reported. Pt states she has a chronic cough from having multiple bouts of PNE.

## 2018-08-26 ENCOUNTER — Ambulatory Visit: Payer: Medicare Other | Admitting: Neurology

## 2018-08-26 ENCOUNTER — Ambulatory Visit: Payer: Medicare Other | Admitting: Adult Health

## 2018-08-30 LAB — CULTURE, BLOOD (ROUTINE X 2)
Culture: NO GROWTH
Special Requests: ADEQUATE

## 2018-10-16 ENCOUNTER — Other Ambulatory Visit: Payer: Self-pay

## 2018-10-16 ENCOUNTER — Encounter: Payer: Self-pay | Admitting: Internal Medicine

## 2018-10-16 ENCOUNTER — Non-Acute Institutional Stay: Payer: Medicare Other | Admitting: Internal Medicine

## 2018-10-16 DIAGNOSIS — Z515 Encounter for palliative care: Secondary | ICD-10-CM

## 2018-10-16 NOTE — Progress Notes (Signed)
October 16, 2018 [facility] Consult Note Telephone: [phone]  Fax: [phone]  PATIENT NAME: Paula Bird DOB: [birth year] MRN: [id number]  [facility]. Move in date: 07/19/2014  PRIMARY CARE PROVIDER:   [facility] Bird, Paula PiggGayani Y, MD  REFERRING PROVIDER: [facility]:   Bird, Paula Y, MD [address] [place], [place] [address]  [facility]  RESPONSIBLE PARTY:   Wynne DustGuy Bird (son) [phone]  IMPRESSION / RECOMMENDATIONS:  1. Decline in functional status r/t chronic medical problems: Patient with h/o falls with past fractures (lower back, pelvis, L hand and pelvis, ankle fx, concussion). Most recent fall in April resulted  in ER visit hwen she sufferred a laceration to her head. Decreased mobility d/t fear of falling/gait disorder./easy loss of balance. Patient continent of bowel and bladder. Tendency towards constipation, managed with prune juice/prunes. Senokot was recently added but she doesn't believe she's started it yet.  Independent in ADLs, though guarding assist with showers. Transfers and ambulates independently (walker). Gradual functional decline over the last 2 years following her official Myastenia diagnosis. Migraines (chronic x 50 yrs) have improved with recent increase of Tapazole (to 10mg  from 7.5mg  qd). On going battle with conjunctivitis R>L eye, and a sty in her R eye. She was treated with Cipro eye gtts and warm compresses. Droopy eyes from her Myasthenia Gravis. She is hoping for a virtual visit with the ophthalmologist. [facility], which can upset her stomach. Her current weight is 143lbs. At a height of 5' her BMI is 27.93 kg/m2. She continues with chronic low back pain improved when she is sitting still, managed with Narc and Elavil.       2. Advanced Care Planning: I uploaded most recent DNR into [facility] EMR/[facility].   3. Goals of Care: Patient has been a  resident [facility] for [facility] years. She is a HUGE Elvis fan. Her granddaughter brought her an I-Pad so she could watch Elvis videos on the Internet but she hasn't been able to navigate the technology.  -I set up her I-Pad with the facility Internet and she was able to learn how assess you tube to tap into the videos she loves. But this will be a work in progress. I encouraged her to ask the staff for assistance. TC to son Paula PiperGuy, who expressed wish to have his mom evaluated by facility Psych provider, for possible need of adjustment of antidepressants . 4. Follow up: NP visit in 1-2 months (week of Sept 6th).  I spent 60 minutes providing this consultation,  from 1:30pm to 2:30pm. More than 50% of the time in this consultation was spent coordinating communication, chart review, interview of staff and family members, and reconciling facility MAR with [facility].Marland Kitchen.   HISTORY OF PRESENT ILLNESS:  Paula Bowvelyn Williamsis a 85 Bird.o. femalewith medical h/o myasthenia gravis, frequent falls, gait disturbance, macular degeneration, depression, IBS, COPD, HTN, hyperthyroidism, PAF (no anticoagulants). This is a routine f/u Palliative Care visit from 05/16/2018.  CODE STATUS: DNR on chart  PPS: 50% HOSPICE ELIGIBILITY/DIAGNOSIS: TBD  PAST MEDICAL HISTORY:  Past Medical History:  Diagnosis Date  . Acute and chronic respiratory failure, unspecified whether with hypoxia or hypercapnia (HCC)   . Afib (HCC)   . Allergic rhinitis   . Anemia   . CKD (chronic kidney disease)   . Constipation   . COPD (chronic obstructive pulmonary disease) (HCC)   .  Depression   . Diplopia   . Dysphagia   . Edema, lower extremity   . Esophageal dysmotility   . Gait instability   . GERD (gastroesophageal reflux disease)   . Hypertension   . Hypothyroidism   . IBS (irritable bowel syndrome)   . IDA (iron deficiency anemia)   . Insomnia   . Leukocytosis   . Myasthenia gravis ([place])   . Oral thrush   . PAF (paroxysmal  atrial fibrillation) ([place])   . Physical deconditioning   . Pneumonia 02/2017  . Protein calorie malnutrition ([place])   . Thyroid disease   . Thyroid mass   . Vertigo     SOCIAL HX:  Social History   Tobacco Use  . Smoking status: Never Smoker  . Smokeless tobacco: Never Used  Substance Use Topics  . Alcohol use: No    ALLERGIES: No Known Allergies   PERTINENT MEDICATIONS:  Outpatient Encounter Medications as of 10/16/2018  Medication Sig  . amiodarone (PACERONE) 200 MG tablet Take by mouth daily.   Marland Kitchen amitriptyline (ELAVIL) 50 MG tablet Take 50 mg by mouth at bedtime.  Marland Kitchen artificial tears (LACRILUBE) OINT ophthalmic ointment Place 1 application into both eyes 4 (four) times daily.   Marland Kitchen aspirin EC 81 MG tablet Take 81 mg by mouth daily.  . bisacodyl (DULCOLAX) 10 MG suppository Place 10 mg rectally daily as needed for moderate constipation.  Marland Kitchen BREO ELLIPTA 100-25 MCG/INH AEPB Take 1 puff by mouth daily.  . citalopram (CELEXA) 10 MG tablet Take 10 mg by mouth daily.  Marland Kitchen diltiazem (CARDIZEM LA) 300 MG 24 hr tablet Take 300 mg by mouth daily.  Marland Kitchen esomeprazole (NEXIUM) 40 MG capsule Take 40 mg by mouth daily.  . ferrous sulfate 325 (65 FE) MG tablet Take 325 mg by mouth daily with breakfast.  . HYDROcodone-acetaminophen (NORCO/VICODIN) 5-325 MG tablet Take 1 tablet by mouth every 4 (four) hours as needed for moderate pain.   . INCRUSE ELLIPTA 62.5 MCG/INH AEPB Take 1 puff by mouth daily.  Marland Kitchen lisinopril (PRINIVIL,ZESTRIL) 40 MG tablet Take 40 mg by mouth daily.  . methimazole (TAPAZOLE) 10 MG tablet Take 10 mg by mouth daily.   . Multiple Vitamin (MULTIVITAMIN WITH MINERALS) TABS tablet Take 1 tablet by mouth daily.  . mycophenolate (CELLCEPT) 500 MG tablet Take 1 tablet (500 mg total) by mouth 2 (two) times daily.  . ondansetron (ZOFRAN) 4 MG tablet Take 4 mg by mouth every 8 (eight) hours as needed for nausea or vomiting.  . polyethylene glycol (MIRALAX / GLYCOLAX) packet Take 17 g by mouth  daily. (Patient taking differently: Take 17 g by mouth 2 (two) times daily. )  . senna (SENOKOT) 8.6 MG tablet Take 1 tablet by mouth daily.  . sucralfate (CARAFATE) 1 g tablet Take 1 g by mouth 4 (four) times daily.  . SUMAtriptan (IMITREX) 25 MG tablet Take 25 mg by mouth daily as needed for migraine. 1 tab every 24 hours as needed for migraine. Limit to 10 days /month to avoid medication overuse.  . traZODone (DESYREL) 50 MG tablet Take 25 mg by mouth every 12 (twelve) hours as needed for anxiety.  Marland Kitchen zolpidem (AMBIEN) 10 MG tablet Take 10 mg by mouth at bedtime.   . [DISCONTINUED] sucralfate (CARAFATE) 1 GM/10ML suspension Take 10 mLs (1 g total) by mouth 4 (four) times daily -  with meals and at bedtime. (Patient not taking: Reported on 08/25/2018)   No facility-administered encounter medications on file  as of 10/16/2018.     PHYSICAL EXAM:   General: Well nourished, pleasantly conversant. A & O X 3.  Cardiovascular: regular rate and rhythm Pulmonary: clear ant fields Abdomen: soft, nontender, + bowel sounds Extremities: no joint deformities Skin: no rashes Neurological: Weakness but otherwise nonfocal  Anselm LisMary P Serpe, NP

## 2018-11-27 ENCOUNTER — Ambulatory Visit (INDEPENDENT_AMBULATORY_CARE_PROVIDER_SITE_OTHER): Payer: Medicare Other | Admitting: Neurology

## 2018-11-27 ENCOUNTER — Other Ambulatory Visit: Payer: Self-pay

## 2018-11-27 ENCOUNTER — Encounter: Payer: Self-pay | Admitting: Neurology

## 2018-11-27 VITALS — BP 151/85 | HR 80 | Temp 98.4°F | Ht 60.0 in | Wt 144.0 lb

## 2018-11-27 DIAGNOSIS — G7000 Myasthenia gravis without (acute) exacerbation: Secondary | ICD-10-CM | POA: Diagnosis not present

## 2018-11-27 MED ORDER — MYCOPHENOLATE MOFETIL 500 MG PO TABS: 500.0000 mg | ORAL_TABLET | Freq: Two times a day (BID) | ORAL | 11 refills | Status: AC

## 2018-11-27 NOTE — Progress Notes (Signed)
PATIENT: Paula Bird DOB: 12/31/1925  REASON FOR VISIT: follow up HISTORY FROM: patient  Chief Complaint  Patient presents with  . Myasthenia Gravis    She is here alone today.  Resides at [facility].  She has continued CellCept.  She is having more difficulty with her eyes.  She  . Migraine     HISTORY OF PRESENT ILLNESS: (copied from Dr Rhea Belton note on 01/09/2018) Paula Bird 85 year old right-handed female, accompanied by her daughter Paula Bird, seen in refer by primary care physician Dr. Merlene Laughter for evaluation of eye pain, right eye swelling, initial evaluation was on January 31 2016.  I reviewed and summarized the referring note, she had a history of hypertension, paroxysmal atrial fibrillation, depression, obsessive-compulsive disorder, acid reflux, polypharmacy treatment, also had a history of left knee, left hip replacement,  She used to lives alone at home, but began to suffer multiple falls around 2015, has moved to [facility] since 2016.  She was noted to have right side droopy eyelid swelling since September 2017, gradually getting worse, light sensitivity, she had chronic headache, tends to stay at the right side, with her new right eye symptoms, she noticed more right behind eye pain, she was seen by optometrist, was given eyedrops of unknown name, with only mild improvement. Is difficulty for her to open her right eye,  She denies rash broke out, she denies hearing loss, no dysarthria, no dysphagia  Laboratory evaluation in September 2017, normal CMP with creatinine 0.61, normal CBC, with hemoglobin of 12.2, TSH was decreased to 0.008 with high free T3 8.3, free thyroxine1.50,  Update February 07 2016: She was diagnosed with seropositive generalized myasthenia gravis, bulbar predominant,  Laboratory evaluationshowed positive acetylcholine binding antibody 8.3, blocking antibody 39, anti-striation antibodies  1:160, normal CPK 21, C-reactive protein, ESR, folic acid, vitamin W10, mild elevated free T3 4.Normal free T4, decreased TSH less than 0.01,  She continue have significant right ptosis, intermittent double vision, denies swallowing difficulty, gait abnormality she attributed to left hip and knee pain,  UPDATE Dec 7th 2017: She was seen by endocrinologist Dr. 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 Einar Gip showed no significant abnormality She is able to tolerate prednisone, on tapering dose, CellCept 500 mg twice a day, Mestinon 3 times a day CT chest showed multiple bilateral lung nodule, no thymus pathology noticed, there was aorta 4 cm aneurysm  UPDATE June 07 2016: Seropositive generalized myasthenia gravis She can open her right eye much better, denied double vision, continue have gait difficulty, but sounds like a chronic problem, reviewed her medication list, she is taking CellCept 500 mg twice a day, no longer taking prednisone, and Mestinon 60 mg 3 times a day,  UPDATE October 16 2016:YY Was able to review laboratory evaluations, normal TSH, free T4 was mildly elevated 0.5, negative ANA,there is no significant change in her myasthenia gravis, she has no double vision, no droopy eyelid, able to tolerate CellCept 500 mg twice a day  Update January 09, 2018: She is here to follow-up myasthenia gravis, continue taking CellCept 500 mg twice a day, Mestinon is no longer on her medication list, due to significant GI side effect, there was no worsening of her myasthenia gravis symptoms after stopping Mestinon, in specific she denies diplopia, no droopy eyelid,  She had bilateral ankle fracture in summer  2019, came in wheelchair today, she continues to complain of GI symptoms, diarrhea now with constipation, insomnia,   She also  complains ofworseninganxiety , depression, feel anxious, shortness of breath, bilateral eye itching,  Update November 27, 2018, She complains of worsening anxiety, she lives at [facility] assisted [facility], has to stay by herself in her room since COVID-19 in March, only came out twice, she denies significant muscle weakness, does has increased right eye irritating due to right eye soft tissue swelling, she denies swallowing difficulty, no limb muscle weakness, is taking CellCept 500 mg twice daily  REVIEW OF SYSTEMS: Out of a complete 14 system review of symptoms, the patient complains only of the following symptoms, right eye pain, headaches and all other reviewed systems are negative.   ALLERGIES: No Known Allergies  HOME MEDICATIONS: Outpatient Medications Prior to Visit  Medication Sig Dispense Refill  . amiodarone (PACERONE) 200 MG tablet Take by mouth daily.   0  . amitriptyline (ELAVIL) 50 MG tablet Take 50 mg by mouth at bedtime.  0  . artificial tears (LACRILUBE) OINT ophthalmic ointment Place 1 application into both eyes 4 (four) times daily.     Marland Kitchen aspirin EC 81 MG tablet Take 81 mg by mouth daily.    . bisacodyl (DULCOLAX) 10 MG suppository Place 10 mg rectally daily as needed for moderate constipation.    Marland Kitchen BREO ELLIPTA 100-25 MCG/INH AEPB Take 1 puff by mouth daily.    . Carboxymethylcellulose Sodium (ARTIFICIAL TEARS OP) Apply 1 drop to eye 3 (three) times daily. Dry Eyes    . citalopram (CELEXA) 10 MG tablet Take 10 mg by mouth daily.    Marland Kitchen diltiazem (CARDIZEM LA) 300 MG 24 hr tablet Take 300 mg by mouth daily.  10  . esomeprazole (NEXIUM) 40 MG capsule Take 40 mg by mouth daily.    . ferrous sulfate 325 (65 FE) MG tablet Take 325 mg by mouth daily with breakfast.    . HYDROcodone-acetaminophen (NORCO/VICODIN) 5-325 MG tablet Take 1 tablet by mouth every 4 (four) hours as needed for moderate pain.     . INCRUSE ELLIPTA 62.5 MCG/INH AEPB Take 1 puff by mouth daily.  0  .  lisinopril (PRINIVIL,ZESTRIL) 40 MG tablet Take 40 mg by mouth daily.  10  . methimazole (TAPAZOLE) 10 MG tablet Take 10 mg by mouth daily.     . Multiple Vitamin (MULTIVITAMIN WITH MINERALS) TABS tablet Take 1 tablet by mouth daily.    . mycophenolate (CELLCEPT) 500 MG tablet Take 1 tablet (500 mg total) by mouth 2 (two) times daily. 60 tablet 11  . ondansetron (ZOFRAN) 4 MG tablet Take 4 mg by mouth every 8 (eight) hours as needed for nausea or vomiting.    . polyethylene glycol (MIRALAX / GLYCOLAX) packet Take 17 g by mouth daily. (Patient taking differently: Take 17 g by mouth 2 (two) times daily. ) 14 each 12  . senna (SENOKOT) 8.6 MG tablet Take 1 tablet by mouth daily.    . sucralfate (CARAFATE) 1 g tablet Take 1 g by mouth 4 (four) times daily.    . SUMAtriptan (IMITREX) 25 MG tablet Take 25 mg by mouth daily as needed for migraine. 1 tab every 24 hours as needed for migraine. Limit to 10 days /month to avoid medication overuse.    . traZODone (DESYREL) 50 MG tablet Take 25 mg by mouth every 12 (twelve) hours as needed for anxiety.    Marland Kitchen zolpidem (AMBIEN) 10 MG tablet  Take 10 mg by mouth at bedtime.      No facility-administered medications prior to visit.     PAST MEDICAL HISTORY: Past Medical History:  Diagnosis Date  . Acute and chronic respiratory failure, unspecified whether with hypoxia or hypercapnia ([place])   . Afib ([place])   . Allergic rhinitis   . Anemia   . CKD (chronic kidney disease)   . Constipation   . COPD (chronic obstructive pulmonary disease) ([place])   . Depression   . Diplopia   . Dysphagia   . Edema, lower extremity   . Esophageal dysmotility   . Gait instability   . GERD (gastroesophageal reflux disease)   . Hypertension   . Hypothyroidism   . IBS (irritable bowel syndrome)   . IDA (iron deficiency anemia)   . Insomnia   . Leukocytosis   . Myasthenia gravis ([place])   . Oral thrush   . PAF (paroxysmal atrial fibrillation) ([place])   . Physical deconditioning    . Pneumonia 02/2017  . Protein calorie malnutrition ([place])   . Thyroid disease   . Thyroid mass   . Vertigo     PAST SURGICAL HISTORY: Past Surgical History:  Procedure Laterality Date  . ABDOMINAL HYSTERECTOMY    . APPENDECTOMY    . ESOPHAGOGASTRODUODENOSCOPY (EGD) WITH PROPOFOL N/A 07/21/2017   Procedure: ESOPHAGOGASTRODUODENOSCOPY (EGD) WITH PROPOFOL;  Surgeon: Ronnette Juniper, MD;  Location: [facility];  Service: Gastroenterology;  Laterality: N/A;  . TOTAL HIP ARTHROPLASTY Left   . TOTAL KNEE ARTHROPLASTY Left     FAMILY HISTORY: Family History  Problem Relation Age of Onset  . Hypertension Mother   . Hypertension Son   . Diabetes Neg Hx   . Thyroid disease Neg Hx     SOCIAL HISTORY: Social History   Socioeconomic History  . Marital status: Widowed    Spouse name: Not on file  . Number of children: 2  . Years of education: HS  . Highest education level: Not on file  Occupational History  . Occupation: Retired  Scientific laboratory technician  . Financial resource strain: Not on file  . Food insecurity    Worry: Not on file    Inability: Not on file  . Transportation needs    Medical: Not on file    Non-medical: Not on file  Tobacco Use  . Smoking status: Never Smoker  . Smokeless tobacco: Never Used  Substance and Sexual Activity  . Alcohol use: No  . Drug use: No  . Sexual activity: Not on file  Lifestyle  . Physical activity    Days per week: Not on file    Minutes per session: Not on file  . Stress: Not on file  Relationships  . Social Herbalist on phone: Not on file    Gets together: Not on file    Attends religious service: Not on file    Active member of club or organization: Not on file    Attends meetings of clubs or organizations: Not on file    Relationship status: Not on file  . Intimate partner violence    Fear of current or ex partner: Not on file    Emotionally abused: Not on file    Physically abused: Not on file    Forced sexual  activity: Not on file  Other Topics Concern  . Not on file  Social History Narrative   Resides at [facility].   Left-handed.   2 cups caffeine per  day.      PHYSICAL EXAM  Vitals:   11/27/18 1136  BP: (!) 151/85  Pulse: 80  Temp: 98.4 F (36.9 C)  Weight: 144 lb (65.3 kg)  Height: 5' (1.524 m)   Body mass index is 28.12 kg/m.  Generalized: Well developed, in no acute distress  Cardiology: normal rate and rhythm, no murmur noted Skin: tenderness reported to palpation of right temple, no edema, erythema or drainage from eye noted. Neurological examination  Mentation: Alert oriented to time, place, history taking. Follows all commands speech and language fluent Cranial nerve II-XII: Pupils were equal round reactive to light. Extraocular movements were full, visual field were full on confrontational test. Facial sensation and strength were normal. Uvula tongue midline. Head turning and shoulder shrug  were normal and symmetric. Motor: The motor testing reveals 4 over 5 strength of all 4 extremities. Good symmetric motor tone is noted throughout.  Sensory: Sensory testing is intact to soft touch on all 4 extremities. No evidence of extinction is noted.  Coordination: Cerebellar testing reveals good finger-nose-finger bilaterally.  Gait and station: Gait is narrow and slow (with Rolator) Reflexes: Deep tendon reflexes are symmetric and normal bilaterally.   DIAGNOSTIC DATA (LABS, IMAGING, TESTING) - I reviewed patient records, labs, notes, testing and imaging myself where available.  No flowsheet data found.   Lab Results  Component Value Date   WBC 5.6 08/25/2018   HGB 9.7 (L) 08/25/2018   HCT 33.9 (L) 08/25/2018   MCV 73.9 (L) 08/25/2018   PLT 130 (L) 08/25/2018      Component Value Date/Time   NA 140 08/25/2018 1130   NA 141 10/16/2016 1429   K 3.8 08/25/2018 1130   CL 110 08/25/2018 1130   CO2 22 08/25/2018 1130   GLUCOSE 90 08/25/2018 1130   BUN  19 08/25/2018 1130   BUN 15 10/16/2016 1429   CREATININE 0.41 (L) 08/25/2018 1130   CALCIUM 8.6 (L) 08/25/2018 1130   PROT 7.7 10/19/2017 1126   PROT 6.6 10/16/2016 1429   ALBUMIN 4.0 10/19/2017 1126   ALBUMIN 4.1 10/16/2016 1429   AST 18 10/19/2017 1126   ALT 17 10/19/2017 1126   ALKPHOS 59 10/19/2017 1126   BILITOT 0.5 10/19/2017 1126   BILITOT <0.2 10/16/2016 1429   GFRNONAA >60 08/25/2018 1130   GFRAA >60 08/25/2018 1130   No results found for: CHOL, HDL, LDLCALC, LDLDIRECT, TRIG, CHOLHDL Lab Results  Component Value Date   HGBA1C 6.0 (H) 08/11/2015   Lab Results  Component Value Date   VITAMINB12 374 01/31/2016   Lab Results  Component Value Date   TSH 5.92 (H) 05/16/2017    ASSESSMENT AND PLAN 85 y.o. year old female    Serum positive generalized myasthenia gravis  Continue CellCept 500 mg twice a day  If she continue to be stable, may decrease CellCept to 250 mg twice a day at next visit      Marcial Pacas, M.D. Ph.D.  [facility] [place], [place] [address] Phone: [phone] Fax:      [phone]

## 2018-12-09 ENCOUNTER — Non-Acute Institutional Stay: Payer: Medicare Other | Admitting: Internal Medicine

## 2018-12-09 ENCOUNTER — Other Ambulatory Visit: Payer: Self-pay

## 2018-12-09 DIAGNOSIS — Z515 Encounter for palliative care: Secondary | ICD-10-CM

## 2018-12-09 NOTE — Progress Notes (Signed)
Sept 15th, 2020 [facility] Consult Note Telephone: [phone]  Fax: [phone]   PATIENT NAME: Paula Bird DOB: 1926-01-23 MRN: [id number]  [address]. Move in date: 07/19/2014   PRIMARY CARE PROVIDER:   [facility]) Angela Coxasanayaka, Gayani Y, MD Care Team: [facility] Theron AristaLOV 11/27/2018 REFERRING PROVIDER: [facility]:   Angela Coxasanayaka, Gayani Y, MD [address] [place], [place] [address]  [address]   RESPONSIBLE PARTY:   Wynne Dust*Guy Correnti (son) [phone], (dtr) [name] Ricks [phone]  IMPRESSION / RECOMMENDATIONS:  1.Advanced Care Planning:  B. Directives: DNR on chart and [facility] EMR    B. Goals of Care: To continue enjoying her Elvis music. She's got a better handle on using the I-Pad.   2.Functional status r/t chronic medical problems: Patient is feeling much improved since last visit. She feels in better spirits with increase in her amitriptyline.Continues Celexa. Even she mobility is better; she came to the door to greet me, which was a surprise to me. She continues independent in ADLs with just some guarding assist with showers. No recent migraines. Current weight is 144 lbs (stable). At 5' her BMI is 28kg.m2. [facility] her chronic conjunctivitis R>L eye. Current rx with lid scrubs/massages (Ocusoft) to lid and warm compresses. [facility] her Myasthenia. She's shown improvement such that her CellCept may be decreased in the future. Continues chronic low back pain managed with prn Narco (2 doses over the last 2 weeks).   3. Follow up: NP visit in 1-2 months.   I spent 30 minutes providing this consultation from 10:30am to 11am. More than 50% of the time in this consultation was spent coordinating communication, chart review, interview of staff and family members, and reconciling facility MAR with [facility] EMR.Marland Kitchen.     HISTORY OF PRESENT ILLNESS:  Paula Evangelistvelyn Thedford is a 85 y.o. female with medical h/o myasthenia gravis, frequent falls, gait disturbance, macular degeneration, depression, IBS, COPD, HTN, hyperthyroidism, PAF (no anticoagulants). This is a routine f/u Palliative Care visit from 10/16/2018.   CODE STATUS: DNR on chart   PPS: 50% HOSPICE ELIGIBILITY/DIAGNOSIS: TBD PAST MEDICAL HISTORY:  Past Medical History:  Diagnosis Date  . Acute and chronic respiratory failure, unspecified whether with hypoxia or hypercapnia ([facility])   . Afib ([facility])   . Allergic rhinitis   . Anemia   . CKD (chronic kidney disease)   . Constipation   . COPD (chronic obstructive pulmonary disease) ([facility])   . Depression   . Diplopia   . Dysphagia   . Edema, lower extremity   . Esophageal dysmotility   . Gait instability   . GERD (gastroesophageal reflux disease)   . Hypertension   . Hypothyroidism   . IBS (irritable bowel syndrome)   . IDA (iron deficiency anemia)   . Insomnia   . Leukocytosis   . Myasthenia gravis ([facility])   . Oral thrush   . PAF (paroxysmal atrial fibrillation) ([facility])   . Physical deconditioning   . Pneumonia 02/2017  . Protein calorie malnutrition ([facility])   . Thyroid disease   . Thyroid mass   . Vertigo     SOCIAL HX:  Social History   Tobacco Use  . Smoking status: Never Smoker  . Smokeless tobacco: Never Used  Substance Use Topics  . Alcohol use: No    ALLERGIES: No Known Allergies  PERTINENT MEDICATIONS:  Outpatient Encounter Medications as of 12/09/2018  Medication Sig  . amiodarone (PACERONE) 200 MG tablet Take by mouth daily.   Marland Kitchen amitriptyline (ELAVIL) 50 MG tablet Take 100 mg by mouth at bedtime. 2 tabs (100mg ) at hs  . artificial tears (LACRILUBE) OINT ophthalmic ointment Place 1 application into both eyes at bedtime.   . bisacodyl (DULCOLAX) 10 MG suppository Place 10 mg rectally daily as needed for moderate constipation.  Marland Kitchen BREO ELLIPTA 100-25 MCG/INH AEPB Take 1 puff by mouth  daily.  . Carboxymethylcellulose Sodium (ARTIFICIAL TEARS OP) Apply 1 drop to eye 3 (three) times daily. Dry Eyes  . citalopram (CELEXA) 10 MG tablet Take 10 mg by mouth daily.  Marland Kitchen diltiazem (CARDIZEM LA) 300 MG 24 hr tablet Take 300 mg by mouth daily.  Marland Kitchen esomeprazole (NEXIUM) 40 MG capsule Take 40 mg by mouth daily.  . Eyelid Cleansers (OCUSOFT LID SCRUB PLUS) PADS Apply topically. Apply to both eye lids topically two times a day for chalazion/hordeclum. Massage the area of the lid for 3 min  . ferrous sulfate 325 (65 FE) MG tablet Take 325 mg by mouth daily with breakfast.  . HYDROcodone-acetaminophen (NORCO/VICODIN) 5-325 MG tablet Take 1 tablet by mouth every 4 (four) hours as needed for moderate pain.   . INCRUSE ELLIPTA 62.5 MCG/INH AEPB Take 1 puff by mouth daily.  Marland Kitchen lisinopril (PRINIVIL,ZESTRIL) 40 MG tablet Take 40 mg by mouth daily.  . methimazole (TAPAZOLE) 10 MG tablet Take 10 mg by mouth daily.   . Multiple Vitamin (MULTIVITAMIN WITH MINERALS) TABS tablet Take 1 tablet by mouth daily.  . mycophenolate (CELLCEPT) 500 MG tablet Take 1 tablet (500 mg total) by mouth 2 (two) times daily.  . ondansetron (ZOFRAN) 4 MG tablet Take 4 mg by mouth every 8 (eight) hours as needed for nausea or vomiting.  . polyethylene glycol (MIRALAX / GLYCOLAX) packet Take 17 g by mouth daily. (Patient taking differently: Take 17 g by mouth 2 (two) times daily. )  . senna (SENOKOT) 8.6 MG tablet Take 1 tablet by mouth daily.  . sucralfate (CARAFATE) 1 g tablet Take 1 g by mouth 4 (four) times daily.  . SUMAtriptan (IMITREX) 25 MG tablet Take 25 mg by mouth daily as needed for migraine. 1 tab every 24 hours as needed for migraine. Limit to 10 days /month to avoid medication overuse.  . traZODone (DESYREL) 50 MG tablet Take 25 mg by mouth every 12 (twelve) hours as needed for anxiety.  Marland Kitchen zolpidem (AMBIEN) 10 MG tablet Take 10 mg by mouth at bedtime.   . [DISCONTINUED] aspirin EC 81 MG tablet Take 81 mg by mouth  daily.   No facility-administered encounter medications on file as of 12/09/2018.     PHYSICAL EXAM:   General: Well nourished, pleasantly conversant. A & O X 3.  Cardiovascular: regular rate and rhythm Pulmonary: clear ant fields Abdomen: soft, nontender, + bowel sounds Extremities: no joint deformities Skin: no rashes Neurological: Weakness but otherwise nonfocal  Julianne Handler, NP

## 2018-12-15 ENCOUNTER — Encounter: Payer: Self-pay | Admitting: Internal Medicine

## 2019-01-22 ENCOUNTER — Other Ambulatory Visit: Payer: Self-pay

## 2019-01-22 ENCOUNTER — Non-Acute Institutional Stay: Payer: Medicare Other | Admitting: Internal Medicine

## 2019-01-22 DIAGNOSIS — Z515 Encounter for palliative care: Secondary | ICD-10-CM

## 2019-01-26 NOTE — Progress Notes (Signed)
Patient scheduled in error; no visit made, no charge. Shiara Mcgough NP-C  [phone] 

## 2019-02-24 ENCOUNTER — Non-Acute Institutional Stay: Payer: Medicare Other | Admitting: Internal Medicine

## 2019-02-24 ENCOUNTER — Encounter: Payer: Self-pay | Admitting: Internal Medicine

## 2019-02-24 ENCOUNTER — Other Ambulatory Visit: Payer: Self-pay

## 2019-02-24 NOTE — Progress Notes (Signed)
Patient not seen. No charge. Violeta Gelinas NP C [phone]

## 2019-04-06 ENCOUNTER — Other Ambulatory Visit: Payer: Self-pay

## 2019-04-06 ENCOUNTER — Encounter (HOSPITAL_COMMUNITY): Payer: Self-pay | Admitting: Obstetrics and Gynecology

## 2019-04-06 ENCOUNTER — Emergency Department (HOSPITAL_COMMUNITY)
Admission: EM | Admit: 2019-04-06 | Discharge: 2019-04-06 | Disposition: A | Discharge status: Home / Self Care | Payer: Medicare Other | Attending: Emergency Medicine | Admitting: Emergency Medicine

## 2019-04-06 ENCOUNTER — Emergency Department (HOSPITAL_COMMUNITY): Payer: Medicare Other

## 2019-04-06 DIAGNOSIS — J449 Chronic obstructive pulmonary disease, unspecified: Secondary | ICD-10-CM | POA: Insufficient documentation

## 2019-04-06 DIAGNOSIS — E039 Hypothyroidism, unspecified: Secondary | ICD-10-CM | POA: Insufficient documentation

## 2019-04-06 DIAGNOSIS — Z79899 Other long term (current) drug therapy: Secondary | ICD-10-CM | POA: Insufficient documentation

## 2019-04-06 DIAGNOSIS — T148XXA Other injury of unspecified body region, initial encounter: Secondary | ICD-10-CM

## 2019-04-06 DIAGNOSIS — I129 Hypertensive chronic kidney disease with stage 1 through stage 4 chronic kidney disease, or unspecified chronic kidney disease: Secondary | ICD-10-CM | POA: Diagnosis not present

## 2019-04-06 DIAGNOSIS — W050XXA Fall from non-moving wheelchair, initial encounter: Secondary | ICD-10-CM | POA: Insufficient documentation

## 2019-04-06 DIAGNOSIS — S0083XA Contusion of other part of head, initial encounter: Secondary | ICD-10-CM | POA: Diagnosis not present

## 2019-04-06 DIAGNOSIS — Y999 Unspecified external cause status: Secondary | ICD-10-CM | POA: Insufficient documentation

## 2019-04-06 DIAGNOSIS — Z96642 Presence of left artificial hip joint: Secondary | ICD-10-CM | POA: Insufficient documentation

## 2019-04-06 DIAGNOSIS — N189 Chronic kidney disease, unspecified: Secondary | ICD-10-CM | POA: Insufficient documentation

## 2019-04-06 DIAGNOSIS — Y939 Activity, unspecified: Secondary | ICD-10-CM | POA: Insufficient documentation

## 2019-04-06 DIAGNOSIS — Y929 Unspecified place or not applicable: Secondary | ICD-10-CM | POA: Diagnosis not present

## 2019-04-06 DIAGNOSIS — Z96652 Presence of left artificial knee joint: Secondary | ICD-10-CM | POA: Insufficient documentation

## 2019-04-06 DIAGNOSIS — S0990XA Unspecified injury of head, initial encounter: Secondary | ICD-10-CM | POA: Diagnosis present

## 2019-04-06 MED ORDER — ACETAMINOPHEN 325 MG PO TABS
650.0000 mg | ORAL_TABLET | Freq: Once | ORAL | Status: AC
  Administered 2019-04-06: 650 mg via ORAL
  Filled 2019-04-06: qty 2

## 2019-04-06 MED ORDER — FENTANYL CITRATE (PF) 100 MCG/2ML IJ SOLN
50.0000 ug | Freq: Once | INTRAMUSCULAR | Status: AC
  Administered 2019-04-06: 50 ug via INTRAVENOUS
  Filled 2019-04-06: qty 2

## 2019-04-06 MED ORDER — HYDROCODONE-ACETAMINOPHEN 5-325 MG PO TABS
1.0000 | ORAL_TABLET | Freq: Once | ORAL | Status: AC
  Administered 2019-04-06: 1 via ORAL
  Filled 2019-04-06: qty 1

## 2019-04-06 NOTE — ED Provider Notes (Signed)
Emergency Department Provider Note   I have reviewed the triage vital signs and the nursing notes.   HISTORY  Chief Complaint Fall and Eye Injury   HPI Paula Bird is a 86 y.o. female with multimedical problems documented below who presents to the emergency department today after a fall.  Patient states that she is very prone to falling and falls very often.  States 1 week she fell 4 times.  This time she fell when she tried to transfer between her wheelchair in the bed.  She sustained a hematoma above her right eye and an abrasion in that same area.  She also has a ecchymosis and skin tear to her left wrist but no deformity.  She states she does not feel she broke any bones.  She does state that her knee hurts on examination.   No other associated or modifying symptoms.    Past Medical History:  Diagnosis Date  . Acute and chronic respiratory failure, unspecified whether with hypoxia or hypercapnia ([place])   . Afib ([place])   . Allergic rhinitis   . Anemia   . CKD (chronic kidney disease)   . Constipation   . COPD (chronic obstructive pulmonary disease) ([place])   . Depression   . Diplopia   . Dysphagia   . Edema, lower extremity   . Esophageal dysmotility   . Gait instability   . GERD (gastroesophageal reflux disease)   . Hypertension   . Hypothyroidism   . IBS (irritable bowel syndrome)   . IDA (iron deficiency anemia)   . Insomnia   . Leukocytosis   . Myasthenia gravis ([place])   . Oral thrush   . PAF (paroxysmal atrial fibrillation) ([place])   . Physical deconditioning   . Pneumonia 02/2017  . Protein calorie malnutrition ([place])   . Thyroid disease   . Thyroid mass   . Vertigo     Patient Active Problem List   Diagnosis Date Noted  . Closed fracture of left ankle   . Closed fracture of right ankle   . Fibula fracture 10/19/2017  . Avulsion fracture of medial malleolus of left tibia 10/19/2017  . UTI (urinary tract infection) 07/17/2017  . Infection due to  parainfluenza virus 4 03/06/2017  . Gait abnormality 06/07/2016  . Hyperthyroidism 04/24/2016  . Myasthenia gravis ([place]) 02/07/2016  . Ptosis of eyelid, right 01/31/2016  . Pain, eye, right 01/31/2016  . Falling 01/31/2016  . Vertigo 10/08/2015  . Colon polyp 08/30/2015  . Arthritis, degenerative 08/30/2015  . Acid indigestion 08/30/2015  . Psoriasis 08/30/2015  . Avitaminosis D 08/30/2015  . HCAP (healthcare-associated pneumonia) 08/08/2015  . Respiratory distress 08/08/2015  . Essential hypertension 08/08/2015  . COPD exacerbation ([place]) 08/08/2015  . Dyspnea 08/04/2015  . Hypertension 08/04/2015  . Depression 08/04/2015  . Insomnia 08/04/2015  . Obstructive chronic bronchitis with acute exacerbation ([place]) 08/04/2015  . Thrombocytopenia ([place]) 08/04/2015  . Paroxysmal atrial fibrillation ([place]) 08/04/2015  . Acute exacerbation of chronic obstructive bronchitis ([place]) 08/04/2015  . Infection of urinary tract 07/01/2014  . Bradycardia 11/24/2013  . Fall in home 11/22/2013  . Pulmonary hypertension ([place]) 11/19/2013  . Excessive falling 09/07/2012  . Encounter for general adult medical examination without abnormal findings 03/24/2012  . Atrial paroxysmal tachycardia ([place]) 10/05/2011  . Family history of colon cancer 05/15/2011  . Mass of pelvis 05/15/2011    Past Surgical History:  Procedure Laterality Date  . ABDOMINAL HYSTERECTOMY    . APPENDECTOMY    . ESOPHAGOGASTRODUODENOSCOPY (  EGD) WITH PROPOFOL N/A 07/21/2017   Procedure: ESOPHAGOGASTRODUODENOSCOPY (EGD) WITH PROPOFOL;  Surgeon: Kerin Salen, MD;  Location: [facility];  Service: Gastroenterology;  Laterality: N/A;  . TOTAL HIP ARTHROPLASTY Left   . TOTAL KNEE ARTHROPLASTY Left     Current Outpatient Rx  . Order #: [id number] Class: Historical Med  . Order #: [id number] Class: Historical Med  . Order #: [id number] Class: Historical Med  . Order #: [id number] Class: Historical Med  . Order #: [id number] Class: Historical Med    . Order #: [id number] Class: Historical Med  . Order #: [id number] Class: Historical Med  . Order #: [id number] Class: Historical Med  . Order #: [id number] Class: Historical Med  . Order #: [id number] Class: Historical Med  . Order #: [id number] Class: Historical Med  . Order #: [id number] Class: Historical Med  . Order #: [id number] Class: Historical Med  . Order #: [id number] Class: Historical Med  . Order #: [id number] Class: Historical Med  . Order #: [id number] Class: Historical Med  . Order #: [id number] Class: Historical Med  . Order #: [id number] Class: Print  . Order #: [id number] Class: Historical Med  . Order #: [id number] Class: No Print  . Order #: [id number] Class: Historical Med  . Order #: [id number] Class: Historical Med  . Order #: [id number] Class: Historical Med  . Order #: [id number] Class: Historical Med  . Order #: [id number] Class: Historical Med    Allergies Patient has no known allergies.  Family History  Problem Relation Age of Onset  . Hypertension Mother   . Hypertension Son   . Diabetes Neg Hx   . Thyroid disease Neg Hx     Social History Social History   Tobacco Use  . Smoking status: Never Smoker  . Smokeless tobacco: Never Used  Substance Use Topics  . Alcohol use: No  . Drug use: No    Review of Systems  All other systems negative except as documented in the HPI. All pertinent positives and negatives as reviewed in the HPI. ____________________________________________   PHYSICAL EXAM:  VITAL SIGNS: Vitals:   04/06/19 0531 04/06/19 0721  BP: (!) 142/87 (!) 126/107  Pulse: 69 88  Resp: 18 19  Temp: 98.5 F (36.9 C)   TempSrc: Oral   SpO2: 98% 98%    Constitutional: Alert and oriented. Well appearing and in no acute distress. Eyes: Conjunctivae are normal. PERRL. EOMI. Vision baseline Head: large hematoma above right eye with some dried blood and associated abrasion. Nose: No congestion/rhinnorhea. Mouth/Throat: Mucous membranes are moist.  Oropharynx  non-erythematous. Neck: No stridor.  No meningeal signs.   Cardiovascular: Normal rate, regular rhythm. Good peripheral circulation. Grossly normal heart sounds.   Respiratory: Normal respiratory effort.  No retractions. Lungs CTAB. Gastrointestinal: Soft and nontender. No distention.  Musculoskeletal: No lower extremity tenderness nor edema. No gross deformities of extremities. Neurologic:  Normal speech and language. No gross focal neurologic deficits are appreciated.  Skin:  Skin tear to left wrist, ecchymosis surrounding it   ____________________________________________   LABS (all labs ordered are listed, but only abnormal results are displayed)  Labs Reviewed - No data to display ____________________________________________  EKG   EKG Interpretation  Date/Time:    Ventricular Rate:    PR Interval:    QRS Duration:   QT Interval:    QTC Calculation:   R Axis:     Text Interpretation:         ____________________________________________  RADIOLOGY  No results found.  ____________________________________________   PROCEDURES  Procedure(s) performed:   Procedures   ____________________________________________   INITIAL IMPRESSION / ASSESSMENT AND PLAN /  ED COURSE  States she is current on tetanus. Will image affected parts. No e/o laceration needing repair.   Ct's ok. Continues to be stable. No indication to workup chronic falls. No indication for further imaging or admission to the hospital.   Pertinent labs & imaging results that were available during my care of the patient were reviewed by me and considered in my medical decision making (see chart for details).  A medical screening exam was performed and I feel the patient has had an appropriate workup for their chief complaint at this time and likelihood of emergent condition existing is low. They have been counseled on decision, discharge, follow up and which symptoms necessitate immediate return  to the emergency department. They or their family verbally stated understanding and agreement with plan and discharged in stable condition.   ____________________________________________  FINAL CLINICAL IMPRESSION(S) / ED DIAGNOSES  Final diagnoses:  Contusion of face, initial encounter  Abrasion     MEDICATIONS GIVEN DURING THIS VISIT:  Medications  fentaNYL (SUBLIMAZE) injection 50 mcg (50 mcg Intravenous Given 04/06/19 0551)  acetaminophen (TYLENOL) tablet 650 mg (650 mg Oral Given 04/06/19 0548)  HYDROcodone-acetaminophen (NORCO/VICODIN) 5-325 MG per tablet 1 tablet (1 tablet Oral Given 04/06/19 0720)     NEW OUTPATIENT MEDICATIONS STARTED DURING THIS VISIT:  Discharge Medication List as of 04/06/2019  6:37 AM      Note:  This note was prepared with assistance of Dragon voice recognition software. Occasional wrong-word or sound-a-like substitutions may have occurred due to the inherent limitations of voice recognition software.   Rooney Swails, Barbara Cower, MD 04/08/19 [phone]

## 2019-04-06 NOTE — ED Triage Notes (Signed)
Per EMS: Patient is coming from [facility] With a complaint of fall approximately 4:50am this morning. Patient reportedly hit her right eye. Hematoma noted to right eye and left wrist.  Pt denies neck or back pain and denies LOC.  Patient denies blood thinners.

## 2019-05-08 ENCOUNTER — Encounter: Payer: Self-pay | Admitting: Internal Medicine

## 2019-05-08 ENCOUNTER — Non-Acute Institutional Stay: Payer: Medicare Other | Admitting: Internal Medicine

## 2019-05-08 ENCOUNTER — Other Ambulatory Visit: Payer: Self-pay

## 2019-05-08 DIAGNOSIS — Z515 Encounter for palliative care: Secondary | ICD-10-CM

## 2019-05-08 DIAGNOSIS — Z7189 Other specified counseling: Secondary | ICD-10-CM

## 2019-05-08 NOTE — Progress Notes (Addendum)
Feb 12th, 2021 [facility] Palliative Care Consult Note Telephone: [phone]  Fax: [phone]   PATIENT NAME: Paula Bird DOB: [birth year] MRN: [id number]  [place] [address] R[address]. Move in date: 07/19/2014   PRIMARY CARE PROVIDER:   [facility]) Angela Cox, MD Care Team: Dr. Levert Feinstein [facility] Neurology) Theron Arista 11/27/2018  REFERRING PROVIDER: [facility]:   Angela Cox, MD [address] [place], [place] [address]  [place] [address]    RESPONSIBLE PARTY:   Saylee Sherrill (son) [phone], (dtr) Erskine Squibb Ricks [phone]   IMPRESSION / RECOMMENDATIONS:  1.Advanced Care Planning:   A. Directives: DNR on facility chart and in [facility] EMR     B. Goals of Care: To continue enjoying her Elvis music. We reviewed, again, how to use her IPAD. She has a new Alexa with a video screen.     2. Functional status r/t chronic medical problems: Patient is in the best spirits that I have ever seen her in. Other than her ongoing battle with her conjunctivitis, she mentions she's been  feeling very well over this last month. Denies much in the way of pain and has not had any headaches. She had a fall about a month ago resulting in a large hematoma over her right eye; she still has a small hard lump from this. Since that fall, she has had a fear of ambulating. She can motor about quite quickly in her wheelchair by pushing with her feet. She can self-transfer to the toilet and is continent. We reviewed some simple LE exercises she can do, and I left those sheets with her. She seemed motivated to try.    3. Follow up: NP visit Monday March 15th. I called and left a Voice Message on the mobile phone of son Tianne Plott, mentioning that I had visited. I invited him to call me for updates, and provided my contact information.   I spent 60 minutes providing this consultation from 2pm to 3pm. More than 50% of the time in this consultation was spent coordinating communication,  chart review, interview of staff and family members, and reconciling facility MAR with [facility] EMR.Marland Kitchen    HISTORY OF PRESENT ILLNESS:  Micheal Sheen is a 86 y.o. female with medical h/o myasthenia gravis, frequent falls, gait disturbance, macular degeneration, depression, IBS, COPD, HTN, hyperthyroidism, PAF (no anticoagulants).  04/06/2019: ER fall with hematoma and abrasion R eye, and Skin tear to L wrist This is a routine f/u Palliative Care visit from 12/09/2018.   CODE STATUS: DNR on chart   PPS: 50%  HOSPICE ELIGIBILITY/DIAGNOSIS: TBD  PAST MEDICAL HISTORY:  Past Medical History:  Diagnosis Date  . Acute and chronic respiratory failure, unspecified whether with hypoxia or hypercapnia ([facility])   . Afib ([facility])   . Allergic rhinitis   . Anemia   . CKD (chronic kidney disease)   . Constipation   . COPD (chronic obstructive pulmonary disease) ([facility])   . Depression   . Diplopia   . Dysphagia   . Edema, lower extremity   . Esophageal dysmotility   . Gait instability   . GERD (gastroesophageal reflux disease)   . Hypertension   . Hypothyroidism   . IBS (irritable bowel syndrome)   . IDA (iron deficiency anemia)   . Insomnia   . Leukocytosis   . Myasthenia gravis (H[facility])   . Oral thrush   . PAF (paroxysmal atrial fibrillation) ([facility])   . Physical deconditioning   .  Pneumonia 02/2017  . Protein calorie malnutrition ([place])   . Thyroid disease   . Thyroid mass   . Vertigo     SOCIAL HX:  Social History   Tobacco Use  . Smoking status: Never Smoker  . Smokeless tobacco: Never Used  Substance Use Topics  . Alcohol use: No    ALLERGIES: No Known Allergies   PERTINENT MEDICATIONS:  Outpatient Encounter Medications as of 05/08/2019  Medication Sig  . amiodarone (PACERONE) 200 MG tablet Take by mouth daily.   Marland Kitchen amitriptyline (ELAVIL) 50 MG tablet Take 100 mg by mouth at bedtime. 2 tabs (100mg ) at hs  . artificial tears (LACRILUBE) OINT ophthalmic ointment Place 1 application into  both eyes at bedtime.   . bisacodyl (DULCOLAX) 10 MG suppository Place 10 mg rectally daily as needed for moderate constipation.  Marland Kitchen BREO ELLIPTA 100-25 MCG/INH AEPB Take 1 puff by mouth daily.  . Carboxymethylcellulose Sodium (ARTIFICIAL TEARS OP) Apply 1 drop to eye 3 (three) times daily. Dry Eyes  . citalopram (CELEXA) 10 MG tablet Take 10 mg by mouth daily.  Marland Kitchen diltiazem (CARDIZEM LA) 300 MG 24 hr tablet Take 300 mg by mouth daily.  Marland Kitchen esomeprazole (NEXIUM) 40 MG capsule Take 40 mg by mouth daily.  . Eyelid Cleansers (OCUSOFT LID SCRUB PLUS) PADS Apply topically. Apply to both eye lids topically two times a day for chalazion/hordeclum. Massage the area of the lid for 3 min  . ferrous sulfate 325 (65 FE) MG tablet Take 325 mg by mouth daily with breakfast.  . HYDROcodone-acetaminophen (NORCO/VICODIN) 5-325 MG tablet Take 1 tablet by mouth every 4 (four) hours as needed for moderate pain.   . INCRUSE ELLIPTA 62.5 MCG/INH AEPB Take 1 puff by mouth daily.  . magnesium citrate SOLN Take 1 Bottle by mouth once. 232ml q24h prn constipation  . methimazole (TAPAZOLE) 10 MG tablet Take 10 mg by mouth daily.   . Multiple Vitamin (MULTIVITAMIN WITH MINERALS) TABS tablet Take 1 tablet by mouth daily.  . Multiple Vitamins-Minerals (MULTIVITAMIN ADULT PO) Take by mouth. Presser Vision AREDS 2 Cap 2 caps qd  . mycophenolate (CELLCEPT) 500 MG tablet Take 1 tablet (500 mg total) by mouth 2 (two) times daily.  . ondansetron (ZOFRAN) 4 MG tablet Take 4 mg by mouth every 8 (eight) hours as needed for nausea or vomiting.  . polyethylene glycol (MIRALAX / GLYCOLAX) packet Take 17 g by mouth daily. (Patient taking differently: Take 17 g by mouth 2 (two) times daily. )  . senna (SENOKOT) 8.6 MG tablet Take 1 tablet by mouth daily. senolot S 8.6-50mg  qd  . sucralfate (CARAFATE) 1 g tablet Take 1 g by mouth 4 (four) times daily.  . SUMAtriptan (IMITREX) 50 MG tablet Take 50 mg by mouth daily as needed for migraine. 1  tab every 24 hours as needed for migraine. Limit to 10 days /month to avoid medication overuse.  . TRAZODONE HCL PO Take 25 mg by mouth every 12 (twelve) hours as needed for anxiety.   Marland Kitchen zolpidem (AMBIEN) 10 MG tablet Take 10 mg by mouth at bedtime.    No facility-administered encounter medications on file as of 05/08/2019.    PHYSICAL EXAM:   General: Well nourished, pleasantly conversant. A & O X 3. Sitting up in her wheelchair. Cardiovascular: regular rate and rhythm Pulmonary: clear ant fields Abdomen: soft, nontender, + bowel sounds Extremities: no joint deformities, no pedal edema Skin: no rashes Neurological: Weakness but otherwise non-focal  Julianne Handler,  NP

## 2019-06-02 ENCOUNTER — Ambulatory Visit: Payer: Medicare Other | Admitting: Neurology

## 2019-06-26 ENCOUNTER — Other Ambulatory Visit: Payer: Self-pay

## 2019-06-26 ENCOUNTER — Non-Acute Institutional Stay: Payer: Medicare Other | Admitting: Internal Medicine

## 2019-06-26 ENCOUNTER — Encounter: Payer: Self-pay | Admitting: Internal Medicine

## 2019-06-26 VITALS — Wt 149.0 lb

## 2019-06-26 DIAGNOSIS — Z7189 Other specified counseling: Secondary | ICD-10-CM

## 2019-06-26 DIAGNOSIS — Z515 Encounter for palliative care: Secondary | ICD-10-CM

## 2019-06-26 NOTE — Progress Notes (Signed)
April 2nd, 2021 [place] Consult Note Telephone: [phone]  Fax: [phone]  PATIENT NAME:Paula Bird DOB:1925-12-07 FUX:[id number] [place] Rm 1. Move in date: 07/19/2014  [place] NP [facility]  REFERRING PROVIDER:[facility]:Paula Andree Elk NP [facility]  RESPONSIBLE PARTY:*Guy Fujimoto (son) [phone], (dtr) [name] [phone]  IMPRESSION /RECOMMENDATIONS: 1.Advanced Care Planning:             A. Directives: DNR on facility chart and in [place]:To continue enjoying her Elvis music.   2. Functional status r/t chronic medical problems: Patient in very good spirits today, likely r/t facility opening up to visitors, communal dining, and activities. She suffered the loss of a close friend recently, and staff reported signs of depression. Her PCP increased her citalopram; she continues amitriptyline.  She been battling constipation; finally moved her bowels after a week. She recently started Linzess, and continues on daily Senna and bid MiraLax.   Fair appetite; has been talking with the facility chef regarding prep of her meals (decrease the spice, don't bread the chicken) and finds the food more palitable. Her weight is stable at 149lbs.  She motors about quite quickly in her wheelchair by pushing with her feet. She can self-transfer to the toilet and is continent.   Patient reports she is sleeping much better (trazodone discontinued), and she can have prn Vistaril. Continues Ambien.  3.Follow up:NP visit in 1-2 months. I will call  son Paula Bird with updates.  I spent7minutes providing this consultationfrom 10amto 11am. More than 50% of the time in this consultation was spent coordinating communication, chart review, interview of staff, and reconciling facility MAR with [facility] EMR.Marland Kitchen   HISTORY OF PRESENT ILLNESS:Paula Williamsis a 86  y.o.femalewith medicalh/omyasthenia gravis, frequent falls, gait disturbance, macular degeneration, depression, IBS, COPD, HTN, hyperthyroidism, PAF(no anticoagulants).  04/06/2019: ER fall with hematoma and abrasion R eye, and Skin tear to L wrist This is a routine f/uPalliative Carevisit from 05/08/2019.  CODE STATUS:DNR on chart  PPS:50%  HOSPICE ELIGIBILITY/DIAGNOSIS:TBD  PAST MEDICAL HISTORY:      Past Medical History:  Diagnosis Date  . Acute and chronic respiratory failure, unspecified whether with hypoxia or hypercapnia ([place])   . Afib ([place])   . Allergic rhinitis   . Anemia   . CKD (chronic kidney disease)   . Constipation   . COPD (chronic obstructive pulmonary disease) ([place])   . Depression   . Diplopia   . Dysphagia   . Edema, lower extremity   . Esophageal dysmotility   . Gait instability   . GERD (gastroesophageal reflux disease)   . Hypertension   . Hypothyroidism   . IBS (irritable bowel syndrome)   . IDA (iron deficiency anemia)   . Insomnia   . Leukocytosis   . Myasthenia gravis ([place])   . Oral thrush   . PAF (paroxysmal atrial fibrillation) ([place])   . Physical deconditioning   . Pneumonia 02/2017  . Protein calorie malnutrition ([place])   . Thyroid disease   . Thyroid mass   . Vertigo     SOCIAL HX:  Social History       Tobacco Use  . Smoking status: Never Smoker  . Smokeless tobacco: Never Used  Substance Use Topics  . Alcohol use: No    ALLERGIES: No Known Allergies   PERTINENT MEDICATIONS:      Outpatient Encounter  Medications as of 05/08/2019  Medication Sig  . amiodarone (PACERONE) 200 MG tablet Take by mouth daily.   Marland Kitchen amitriptyline (ELAVIL) 50 MG tablet Take 100 mg by mouth at bedtime. 2 tabs (100mg ) at hs  . artificial tears (LACRILUBE) OINT ophthalmic ointment Place 1 application into both eyes at bedtime.   . bisacodyl (DULCOLAX) 10 MG suppository Place 10 mg rectally daily as needed for  moderate constipation.  BREO ELLIPTA 100-25 MCG/INH AEPB Take 1 puff by mouth daily.  . Carboxymethylcellulose Sodium (ARTIFICIAL TEARS OP) Apply 1 drop to eye 3 (three) times daily. Dry Eyes  . citalopram (CELEXA) 10 MG tablet Take 10 mg by mouth daily.  Marland Kitchen diltiazem (CARDIZEM LA) 300 MG 24 hr tablet Take 300 mg by mouth daily.  Marland Kitchen esomeprazole (NEXIUM) 40 MG capsule Take 40 mg by mouth daily.  . Eyelid Cleansers (OCUSOFT LID SCRUB PLUS) PADS Apply topically. Apply to both eye lids topically two times a day for chalazion/hordeclum. Massage the area of the lid for 3 min  . ferrous sulfate 325 (65 FE) MG tablet Take 325 mg by mouth daily with breakfast.  . HYDROcodone-acetaminophen (NORCO/VICODIN) 5-325 MG tablet Take 1 tablet by mouth every 4 (four) hours as needed for moderate pain.   . INCRUSE ELLIPTA 62.5 MCG/INH AEPB Take 1 puff by mouth daily.  . magnesium citrate SOLN Take 1 Bottle by mouth once. Marland Kitchen q24h prn constipation  . methimazole (TAPAZOLE) 10 MG tablet Take 10 mg by mouth daily.   . Multiple Vitamin (MULTIVITAMIN WITH MINERALS) TABS tablet Take 1 tablet by mouth daily.  . Multiple Vitamins-Minerals (MULTIVITAMIN ADULT PO) Take by mouth. Presser Vision AREDS 2 Cap 2 caps qd  . mycophenolate (CELLCEPT) 500 MG tablet Take 1 tablet (500 mg total) by mouth 2 (two) times daily.  . ondansetron (ZOFRAN) 4 MG tablet Take 4 mg by mouth every 8 (eight) hours as needed for nausea or vomiting.  . polyethylene glycol (MIRALAX / GLYCOLAX) packet Take 17 g by mouth daily. (Patient taking differently: Take 17 g by mouth 2 (two) times daily. )  . senna (SENOKOT) 8.6 MG tablet Take 1 tablet by mouth daily. senolot S 8.6-50mg  qd  . sucralfate (CARAFATE) 1 g tablet Take 1 g by mouth 4 (four) times daily.  . SUMAtriptan (IMITREX) 50 MG tablet Take 50 mg by mouth daily as needed for migraine. 1 tab every 24 hours as needed for migraine. Limit to 10 days /month to avoid medication overuse.  . TRAZODONE  HCL PO Take 25 mg by mouth every 12 (twelve) hours as needed for anxiety.   zolpidem (AMBIEN) 10 MG tablet Take 10 mg by mouth at bedtime.    No facility-administered encounter medications on file as of 05/08/2019.    PHYSICAL EXAM:  Sitting in her wheelchair finishing breakfast Well nourished, pleasantly conversant. A & O X 3. Cardiovascular: regular rate and rhythm Pulmonary: clear ant fields Abdomen: soft, nontender, + bowel sounds Extremities: no joint deformities, no pedal edema Skin: no rashes Neurological: non-focal  07/06/2019, NP

## 2019-06-30 ENCOUNTER — Telehealth: Payer: Self-pay | Admitting: Internal Medicine

## 2019-06-30 NOTE — Telephone Encounter (Signed)
1:50pm: TC to Wynne Dust, patient's son, with updates of my visit 06/26/2018. Mr. Perlstein mentioned the a close companion of his mom's (Bud) had passed recently; they had been close companions for many years. Patient had a change to visit with Bud, who was in a coma at the time, to say her good byes before he passed. Holly Bodily NP-C [phone]

## 2019-07-14 ENCOUNTER — Inpatient Hospital Stay (HOSPITAL_COMMUNITY)
Admission: EM | Admit: 2019-07-14 | Discharge: 2019-07-18 | DRG: 177 | Disposition: A | Discharge status: Skilled Nursing Facility | Payer: Medicare Other | Source: Skilled Nursing Facility | Attending: Family Medicine | Admitting: Family Medicine

## 2019-07-14 ENCOUNTER — Encounter (HOSPITAL_COMMUNITY): Payer: Self-pay

## 2019-07-14 ENCOUNTER — Emergency Department (HOSPITAL_COMMUNITY): Payer: Medicare Other

## 2019-07-14 DIAGNOSIS — E0590 Thyrotoxicosis, unspecified without thyrotoxic crisis or storm: Secondary | ICD-10-CM | POA: Diagnosis not present

## 2019-07-14 DIAGNOSIS — J9811 Atelectasis: Secondary | ICD-10-CM | POA: Diagnosis present

## 2019-07-14 DIAGNOSIS — J9601 Acute respiratory failure with hypoxia: Secondary | ICD-10-CM | POA: Diagnosis present

## 2019-07-14 DIAGNOSIS — Z90710 Acquired absence of both cervix and uterus: Secondary | ICD-10-CM

## 2019-07-14 DIAGNOSIS — Z66 Do not resuscitate: Secondary | ICD-10-CM | POA: Diagnosis present

## 2019-07-14 DIAGNOSIS — J440 Chronic obstructive pulmonary disease with acute lower respiratory infection: Secondary | ICD-10-CM | POA: Diagnosis present

## 2019-07-14 DIAGNOSIS — F419 Anxiety disorder, unspecified: Secondary | ICD-10-CM | POA: Diagnosis present

## 2019-07-14 DIAGNOSIS — F0281 Dementia in other diseases classified elsewhere with behavioral disturbance: Secondary | ICD-10-CM | POA: Diagnosis not present

## 2019-07-14 DIAGNOSIS — D649 Anemia, unspecified: Secondary | ICD-10-CM | POA: Diagnosis present

## 2019-07-14 DIAGNOSIS — Z79891 Long term (current) use of opiate analgesic: Secondary | ICD-10-CM | POA: Diagnosis not present

## 2019-07-14 DIAGNOSIS — I10 Essential (primary) hypertension: Secondary | ICD-10-CM | POA: Diagnosis present

## 2019-07-14 DIAGNOSIS — K224 Dyskinesia of esophagus: Secondary | ICD-10-CM | POA: Diagnosis present

## 2019-07-14 DIAGNOSIS — Z8249 Family history of ischemic heart disease and other diseases of the circulatory system: Secondary | ICD-10-CM | POA: Diagnosis not present

## 2019-07-14 DIAGNOSIS — F329 Major depressive disorder, single episode, unspecified: Secondary | ICD-10-CM | POA: Diagnosis present

## 2019-07-14 DIAGNOSIS — I482 Chronic atrial fibrillation, unspecified: Secondary | ICD-10-CM | POA: Diagnosis present

## 2019-07-14 DIAGNOSIS — E876 Hypokalemia: Secondary | ICD-10-CM | POA: Diagnosis present

## 2019-07-14 DIAGNOSIS — D509 Iron deficiency anemia, unspecified: Secondary | ICD-10-CM | POA: Diagnosis present

## 2019-07-14 DIAGNOSIS — J1282 Pneumonia due to coronavirus disease 2019: Secondary | ICD-10-CM | POA: Diagnosis present

## 2019-07-14 DIAGNOSIS — Z79899 Other long term (current) drug therapy: Secondary | ICD-10-CM | POA: Diagnosis not present

## 2019-07-14 DIAGNOSIS — U071 COVID-19: Secondary | ICD-10-CM | POA: Diagnosis not present

## 2019-07-14 DIAGNOSIS — Z7982 Long term (current) use of aspirin: Secondary | ICD-10-CM

## 2019-07-14 DIAGNOSIS — K589 Irritable bowel syndrome without diarrhea: Secondary | ICD-10-CM | POA: Diagnosis present

## 2019-07-14 DIAGNOSIS — Z993 Dependence on wheelchair: Secondary | ICD-10-CM

## 2019-07-14 DIAGNOSIS — K219 Gastro-esophageal reflux disease without esophagitis: Secondary | ICD-10-CM | POA: Diagnosis present

## 2019-07-14 DIAGNOSIS — E039 Hypothyroidism, unspecified: Secondary | ICD-10-CM | POA: Diagnosis present

## 2019-07-14 DIAGNOSIS — G20 Parkinson's disease: Secondary | ICD-10-CM | POA: Diagnosis not present

## 2019-07-14 LAB — GLUCOSE, CAPILLARY: Glucose-Capillary: 166 mg/dL — ABNORMAL HIGH (ref 70–99)

## 2019-07-14 LAB — CBC WITH DIFFERENTIAL/PLATELET
Abs Immature Granulocytes: 0.03 10*3/uL (ref 0.00–0.07)
Basophils Absolute: 0 10*3/uL (ref 0.0–0.1)
Basophils Relative: 1 %
Eosinophils Absolute: 0.1 10*3/uL (ref 0.0–0.5)
Eosinophils Relative: 1 %
HCT: 48.4 % — ABNORMAL HIGH (ref 36.0–46.0)
Hemoglobin: 15.1 g/dL — ABNORMAL HIGH (ref 12.0–15.0)
Immature Granulocytes: 1 %
Lymphocytes Relative: 9 %
Lymphs Abs: 0.6 10*3/uL — ABNORMAL LOW (ref 0.7–4.0)
MCH: 28.3 pg (ref 26.0–34.0)
MCHC: 31.2 g/dL (ref 30.0–36.0)
MCV: 90.6 fL (ref 80.0–100.0)
Monocytes Absolute: 0.7 10*3/uL (ref 0.1–1.0)
Monocytes Relative: 10 %
Neutro Abs: 4.9 10*3/uL (ref 1.7–7.7)
Neutrophils Relative %: 78 %
Platelets: 124 10*3/uL — ABNORMAL LOW (ref 150–400)
RBC: 5.34 MIL/uL — ABNORMAL HIGH (ref 3.87–5.11)
RDW: 14.9 % (ref 11.5–15.5)
WBC: 6.3 10*3/uL (ref 4.0–10.5)
nRBC: 0 % (ref 0.0–0.2)

## 2019-07-14 LAB — LACTIC ACID, PLASMA
Lactic Acid, Venous: 1.3 mmol/L (ref 0.5–1.9)
Lactic Acid, Venous: 1.3 mmol/L (ref 0.5–1.9)

## 2019-07-14 LAB — COMPREHENSIVE METABOLIC PANEL
ALT: 19 U/L (ref 0–44)
AST: 18 U/L (ref 15–41)
Albumin: 3.8 g/dL (ref 3.5–5.0)
Alkaline Phosphatase: 82 U/L (ref 38–126)
Anion gap: 13 (ref 5–15)
BUN: 9 mg/dL (ref 8–23)
CO2: 26 mmol/L (ref 22–32)
Calcium: 8.7 mg/dL — ABNORMAL LOW (ref 8.9–10.3)
Chloride: 102 mmol/L (ref 98–111)
Creatinine, Ser: 0.48 mg/dL (ref 0.44–1.00)
GFR calc Af Amer: >60 mL/min (ref >60)
GFR calc non Af Amer: >60 mL/min (ref >60)
Glucose, Bld: 105 mg/dL — ABNORMAL HIGH (ref 70–99)
Potassium: 2.9 mmol/L — ABNORMAL LOW (ref 3.5–5.1)
Sodium: 141 mmol/L (ref 135–145)
Total Bilirubin: 0.6 mg/dL (ref 0.3–1.2)
Total Protein: 7.2 g/dL (ref 6.5–8.1)

## 2019-07-14 LAB — D-DIMER, QUANTITATIVE: D-Dimer, Quant: 0.42 ug/mL-FEU (ref 0.00–0.50)

## 2019-07-14 LAB — POC SARS CORONAVIRUS 2 AG -  ED: SARS Coronavirus 2 Ag: POSITIVE — AB

## 2019-07-14 LAB — C-REACTIVE PROTEIN: CRP: 1.4 mg/dL — ABNORMAL HIGH (ref <1.0)

## 2019-07-14 LAB — LACTATE DEHYDROGENASE: LDH: 170 U/L (ref 98–192)

## 2019-07-14 LAB — PROCALCITONIN: Procalcitonin: <0.1 ng/mL

## 2019-07-14 LAB — FERRITIN: Ferritin: 94 ng/mL (ref 11–307)

## 2019-07-14 LAB — TRIGLYCERIDES: Triglycerides: 61 mg/dL (ref <150)

## 2019-07-14 LAB — ABO/RH: ABO/RH(D): A POS

## 2019-07-14 LAB — FIBRINOGEN: Fibrinogen: 355 mg/dL (ref 210–475)

## 2019-07-14 MED ORDER — DEXAMETHASONE SODIUM PHOSPHATE 10 MG/ML IJ SOLN
10.0000 mg | Freq: Once | INTRAMUSCULAR | Status: AC
  Administered 2019-07-14: 16:00:00 10 mg via INTRAVENOUS
  Filled 2019-07-14: qty 1

## 2019-07-14 MED ORDER — SODIUM CHLORIDE 0.9 % IV SOLN: 100.0000 mg | Freq: Every day | INTRAVENOUS | Status: DC

## 2019-07-14 MED ORDER — ONDANSETRON HCL 4 MG/2ML IJ SOLN: 4.0000 mg | Freq: Four times a day (QID) | INTRAMUSCULAR | Status: DC | PRN

## 2019-07-14 MED ORDER — ZOLPIDEM TARTRATE 5 MG PO TABS
5.0000 mg | ORAL_TABLET | Freq: Every evening | ORAL | Status: DC | PRN
  Administered 2019-07-14: 5 mg via ORAL
  Filled 2019-07-14: qty 1

## 2019-07-14 MED ORDER — INSULIN ASPART 100 UNIT/ML ~~LOC~~ SOLN: 0.0000 [IU] | Freq: Every day | SUBCUTANEOUS | Status: DC

## 2019-07-14 MED ORDER — SODIUM CHLORIDE 0.9 % IV SOLN: 200.0000 mg | Freq: Once | INTRAVENOUS | Status: DC

## 2019-07-14 MED ORDER — ONDANSETRON HCL 4 MG PO TABS: 4.0000 mg | ORAL_TABLET | Freq: Four times a day (QID) | ORAL | Status: DC | PRN

## 2019-07-14 MED ORDER — POTASSIUM CHLORIDE 10 MEQ/100ML IV SOLN
10.0000 meq | Freq: Once | INTRAVENOUS | Status: AC
  Administered 2019-07-14: 10 meq via INTRAVENOUS
  Filled 2019-07-14: qty 100

## 2019-07-14 MED ORDER — SODIUM CHLORIDE 0.9 % IV SOLN
100.0000 mg | Freq: Every day | INTRAVENOUS | Status: AC
  Administered 2019-07-15 – 2019-07-18 (×4): 100 mg via INTRAVENOUS
  Filled 2019-07-14 (×4): qty 20

## 2019-07-14 MED ORDER — HYDROCOD POLST-CPM POLST ER 10-8 MG/5ML PO SUER
5.0000 mL | Freq: Two times a day (BID) | ORAL | Status: DC | PRN
  Administered 2019-07-16: 5 mL via ORAL
  Filled 2019-07-14: qty 5

## 2019-07-14 MED ORDER — DEXAMETHASONE SODIUM PHOSPHATE 10 MG/ML IJ SOLN
6.0000 mg | INTRAMUSCULAR | Status: DC
  Administered 2019-07-14 – 2019-07-17 (×4): 6 mg via INTRAVENOUS
  Filled 2019-07-14 (×4): qty 1

## 2019-07-14 MED ORDER — GUAIFENESIN-DM 100-10 MG/5ML PO SYRP: 10.0000 mL | ORAL_SOLUTION | ORAL | Status: DC | PRN

## 2019-07-14 MED ORDER — ZINC SULFATE 220 (50 ZN) MG PO CAPS
220.0000 mg | ORAL_CAPSULE | Freq: Every day | ORAL | Status: DC
  Administered 2019-07-14 – 2019-07-18 (×5): 220 mg via ORAL
  Filled 2019-07-14 (×5): qty 1

## 2019-07-14 MED ORDER — ACETAMINOPHEN 325 MG PO TABS
650.0000 mg | ORAL_TABLET | Freq: Four times a day (QID) | ORAL | Status: DC | PRN
  Administered 2019-07-14: 22:00:00 650 mg via ORAL
  Filled 2019-07-14: qty 2

## 2019-07-14 MED ORDER — POTASSIUM CHLORIDE CRYS ER 20 MEQ PO TBCR
40.0000 meq | EXTENDED_RELEASE_TABLET | Freq: Once | ORAL | Status: AC
  Administered 2019-07-14: 16:00:00 40 meq via ORAL
  Filled 2019-07-14: qty 2

## 2019-07-14 MED ORDER — ALBUTEROL SULFATE HFA 108 (90 BASE) MCG/ACT IN AERS
2.0000 | INHALATION_SPRAY | Freq: Four times a day (QID) | RESPIRATORY_TRACT | Status: DC
  Administered 2019-07-14 – 2019-07-18 (×13): 2 via RESPIRATORY_TRACT
  Filled 2019-07-14: qty 6.7

## 2019-07-14 MED ORDER — SODIUM CHLORIDE 0.9 % IV SOLN
100.0000 mg | INTRAVENOUS | Status: AC
  Administered 2019-07-14 (×2): 100 mg via INTRAVENOUS
  Filled 2019-07-14: qty 20

## 2019-07-14 MED ORDER — ENOXAPARIN SODIUM 40 MG/0.4ML ~~LOC~~ SOLN
40.0000 mg | SUBCUTANEOUS | Status: DC
  Administered 2019-07-14 – 2019-07-17 (×4): 40 mg via SUBCUTANEOUS
  Filled 2019-07-14 (×4): qty 0.4

## 2019-07-14 MED ORDER — INSULIN ASPART 100 UNIT/ML ~~LOC~~ SOLN
0.0000 [IU] | Freq: Three times a day (TID) | SUBCUTANEOUS | Status: DC
  Administered 2019-07-15: 13:00:00 3 [IU] via SUBCUTANEOUS
  Administered 2019-07-16 (×2): 1 [IU] via SUBCUTANEOUS
  Administered 2019-07-17: 2 [IU] via SUBCUTANEOUS
  Administered 2019-07-17: 1 [IU] via SUBCUTANEOUS
  Administered 2019-07-17: 12:00:00 3 [IU] via SUBCUTANEOUS
  Administered 2019-07-18: 2 [IU] via SUBCUTANEOUS
  Administered 2019-07-18: 1 [IU] via SUBCUTANEOUS

## 2019-07-14 MED ORDER — ASCORBIC ACID 500 MG PO TABS
500.0000 mg | ORAL_TABLET | Freq: Every day | ORAL | Status: DC
  Administered 2019-07-14 – 2019-07-18 (×5): 500 mg via ORAL
  Filled 2019-07-14 (×5): qty 1

## 2019-07-14 MED ORDER — PANTOPRAZOLE SODIUM 40 MG IV SOLR
40.0000 mg | Freq: Two times a day (BID) | INTRAVENOUS | Status: DC
  Administered 2019-07-14 – 2019-07-16 (×5): 40 mg via INTRAVENOUS
  Filled 2019-07-14 (×5): qty 40

## 2019-07-14 NOTE — ED Notes (Signed)
IV team at bedside 

## 2019-07-14 NOTE — ED Provider Notes (Signed)
[facility] DEPT Provider Note   CSN: [id number] Arrival date & time: 07/14/19  1248     History Chief Complaint  Patient presents with  . Covid +    Paula Bird is a 86 y.o. female with past medical history significant for acute on chronic respiratory failure, A. fib, CKD, COPD, hypertension, myasthenia gravis presents to emergency department today with chief complaint of shortness of breath and nonproductive cough.  Patient is a resident at [facility] in the assisted living. Patient states she has a chronic cough however it has worsened x the last 2 days.  She is also endorsing generalized body aches.  She states she has shortness of breath at rest and is worse with exertion.  Patient did receive both doses of Covid vaccine.  She takes aspirin daily, no other anticoagulation.  No medications for symptoms prior to arrival.  She denies fever, chills, chest pain, abdominal pain, nausea, vomiting, urinary symptoms, diarrhea, lower extremity edema.  History provided by patient with additional history obtained from chart review.       Past Medical History:  Diagnosis Date  . Acute and chronic respiratory failure, unspecified whether with hypoxia or hypercapnia ([place])   . Afib ([place])   . Allergic rhinitis   . Anemia   . CKD (chronic kidney disease)   . Constipation   . COPD (chronic obstructive pulmonary disease) ([place])   . Depression   . Diplopia   . Dysphagia   . Edema, lower extremity   . Esophageal dysmotility   . Gait instability   . GERD (gastroesophageal reflux disease)   . Hypertension   . Hypothyroidism   . IBS (irritable bowel syndrome)   . IDA (iron deficiency anemia)   . Insomnia   . Leukocytosis   . Myasthenia gravis ([place])   . Oral thrush   . PAF (paroxysmal atrial fibrillation) ([place])   . Physical deconditioning   . Pneumonia 02/2017  . Protein calorie malnutrition ([place])   . Thyroid disease   . Thyroid mass   . Vertigo      Patient Active Problem List   Diagnosis Date Noted  . COVID-19 virus infection 07/14/2019  . Closed fracture of left ankle   . Closed fracture of right ankle   . Fibula fracture 10/19/2017  . Avulsion fracture of medial malleolus of left tibia 10/19/2017  . UTI (urinary tract infection) 07/17/2017  . Infection due to parainfluenza virus 4 03/06/2017  . Gait abnormality 06/07/2016  . Hyperthyroidism 04/24/2016  . Myasthenia gravis ([place]) 02/07/2016  . Ptosis of eyelid, right 01/31/2016  . Pain, eye, right 01/31/2016  . Falling 01/31/2016  . Vertigo 10/08/2015  . Colon polyp 08/30/2015  . Arthritis, degenerative 08/30/2015  . Acid indigestion 08/30/2015  . Psoriasis 08/30/2015  . Avitaminosis D 08/30/2015  . HCAP (healthcare-associated pneumonia) 08/08/2015  . Respiratory distress 08/08/2015  . Essential hypertension 08/08/2015  . COPD exacerbation ([place]) 08/08/2015  . Dyspnea 08/04/2015  . Hypertension 08/04/2015  . Depression 08/04/2015  . Insomnia 08/04/2015  . Obstructive chronic bronchitis with acute exacerbation ([place]) 08/04/2015  . Thrombocytopenia ([place]) 08/04/2015  . Paroxysmal atrial fibrillation ([place]) 08/04/2015  . Acute exacerbation of chronic obstructive bronchitis ([place]) 08/04/2015  . Infection of urinary tract 07/01/2014  . Bradycardia 11/24/2013  . Fall in home 11/22/2013  . Pulmonary hypertension ([place]) 11/19/2013  . Excessive falling 09/07/2012  . Encounter for general adult medical examination without abnormal findings 03/24/2012  . Atrial paroxysmal tachycardia ([place])  10/05/2011  . Family history of colon cancer 05/15/2011  . Mass of pelvis 05/15/2011    Past Surgical History:  Procedure Laterality Date  . ABDOMINAL HYSTERECTOMY    . APPENDECTOMY    . ESOPHAGOGASTRODUODENOSCOPY (EGD) WITH PROPOFOL N/A 07/21/2017   Procedure: ESOPHAGOGASTRODUODENOSCOPY (EGD) WITH PROPOFOL;  Surgeon: Kerin SalenKarki, Arya, MD;  Location: [facility] END[facility]COPY;  Service: Gastroenterology;   Laterality: N/A;  . TOTAL HIP ARTHROPLASTY Left   . TOTAL KNEE ARTHROPLASTY Left      OB History   No obstetric history on file.     Family History  Problem Relation Age of Onset  . Hypertension Mother   . Hypertension Son   . Diabetes Neg Hx   . Thyroid disease Neg Hx     Social History   Tobacco Use  . Smoking status: Never Smoker  . Smokeless tobacco: Never Used  Substance Use Topics  . Alcohol use: No  . Drug use: No    Home Medications Prior to Admission medications   Medication Sig Start Date End Date Taking? Authorizing Provider  amiodarone (PACERONE) 200 MG tablet Take 200 mg by mouth daily.  06/07/17  Yes [provider]  amitriptyline (ELAVIL) 50 MG tablet Take 100 mg by mouth at bedtime. 2 tabs (100mg ) at hs 06/22/17  Yes [provider]  bisacodyl (DULCOLAX) 10 MG suppository Place 10 mg rectally daily as needed for moderate constipation.   Yes [provider]  BREO ELLIPTA 100-25 MCG/INH AEPB Take 1 puff by mouth daily. 06/16/18  Yes [provider]  diltiazem (CARDIZEM LA) 300 MG 24 hr tablet Take 300 mg by mouth daily. 03/04/16  Yes [provider]  esomeprazole (NEXIUM) 40 MG capsule Take 40 mg by mouth daily. 06/11/18  Yes [provider]  ferrous sulfate 325 (65 FE) MG tablet Take 325 mg by mouth daily with breakfast.   Yes [provider]  HYDROcodone-acetaminophen (NORCO/VICODIN) 5-325 MG tablet Take 1 tablet by mouth every 4 (four) hours as needed for moderate pain.    Yes [provider]  hydrOXYzine (ATARAX/VISTARIL) 25 MG tablet Take 25 mg by mouth every 8 (eight) hours as needed for anxiety.    Yes [provider]  INCRUSE ELLIPTA 62.5 MCG/INH AEPB Take 1 puff by mouth daily. 06/10/17  Yes [provider]  linaclotide (LINZESS) 145 MCG CAPS capsule Take 145 mcg by mouth daily before breakfast.   Yes [provider]  magnesium citrate SOLN Take 1 Bottle by  mouth daily as needed for moderate constipation. 240ml q24h prn constipation    Yes [provider]  methimazole (TAPAZOLE) 10 MG tablet Take 10 mg by mouth daily.  06/16/18  Yes [provider]  Multiple Vitamin (MULTIVITAMIN WITH MINERALS) TABS tablet Take 1 tablet by mouth daily.   Yes [provider]  Multiple Vitamins-Minerals (MULTIVITAMIN ADULT PO) Take 2 capsules by mouth daily. [employer] AREDS 2 Cap 2 caps qd    Yes [provider]  mycophenolate (CELLCEPT) 500 MG tablet Take 1 tablet (500 mg total) by mouth 2 (two) times daily. 11/27/18  Yes Levert FeinsteinYan, Yijun, MD  ondansetron (ZOFRAN) 8 MG tablet Take 8 mg by mouth every 8 (eight) hours as needed for nausea or vomiting.   Yes [provider]  polyethylene glycol (MIRALAX / GLYCOLAX) packet Take 17 g by mouth daily. Patient taking differently: Take 17 g by mouth 2 (two) times daily.  10/22/17  Yes Lenox PondsSilva Zapata, Edwin, MD  polyvinyl alcohol (  ARTIFICIAL TEARS) 1.4 % ophthalmic solution Place 1 drop into both eyes 3 (three) times daily.   Yes [provider]  senna (SENOKOT) 8.6 MG tablet Take 2 tablets by mouth daily. senolot S 8.6-50mg  qd   Yes [provider]  sucralfate (CARAFATE) 1 g tablet Take 1 g by mouth 4 (four) times daily. 06/16/18  Yes [provider]  SUMAtriptan (IMITREX) 50 MG tablet Take 50 mg by mouth daily as needed for migraine. 1 tab every 24 hours as needed for migraine. Limit to 10 days /month to avoid medication overuse.   Yes [provider]  zolpidem (AMBIEN) 10 MG tablet Take 10 mg by mouth at bedtime.    Yes [provider]    Allergies    Patient has no known allergies.  Review of Systems   Review of Systems All other systems are reviewed and are negative for acute change except as noted in the HPI.  Physical Exam Updated Vital Signs BP (!) 147/76 (BP Location: Left Arm)   Pulse 95   Temp 98.7 F (37.1 C) (Oral)   Resp  18   SpO2 100%   Physical Exam Vitals and nursing note reviewed.  Constitutional:      General: She is not in acute distress.    Appearance: She is not ill-appearing.  HENT:     Head: Normocephalic and atraumatic.     Right Ear: Tympanic membrane and external ear normal.     Left Ear: Tympanic membrane and external ear normal.     Nose: Nose normal.     Mouth/Throat:     Mouth: Mucous membranes are moist.     Pharynx: Oropharynx is clear.  Eyes:     General: No scleral icterus.       Right eye: No discharge.        Left eye: No discharge.     Extraocular Movements: Extraocular movements intact.     Conjunctiva/sclera: Conjunctivae normal.     Pupils: Pupils are equal, round, and reactive to light.  Neck:     Vascular: No JVD.  Cardiovascular:     Rate and Rhythm: Normal rate and regular rhythm.     Pulses: Normal pulses.          Radial pulses are 2+ on the right side and 2+ on the left side.     Heart sounds: Normal heart sounds.  Pulmonary:     Comments: Symmetric chest rise.  Rales heard in bilateral lower lung fields.  She is hypoxic to 88% on room air when talking.  She is tachypneic Abdominal:     Comments: Abdomen is soft, non-distended, and non-tender in all quadrants. No rigidity, no guarding. No peritoneal signs.  Musculoskeletal:        General: Normal range of motion.     Cervical back: Normal range of motion.     Right lower leg: No edema.     Left lower leg: No edema.  Skin:    General: Skin is warm and dry.     Capillary Refill: Capillary refill takes less than 2 seconds.  Neurological:     Mental Status: She is oriented to person, place, and time.     GCS: GCS eye subscore is 4. GCS verbal subscore is 5. GCS motor subscore is 6.     Comments: Fluent speech, no facial droop.  Psychiatric:        Behavior: Behavior normal.     ED Results / Procedures /  Treatments   Labs (all labs ordered are listed, but only abnormal results are displayed) Labs  Reviewed  CBC WITH DIFFERENTIAL/PLATELET - Abnormal; Notable for the following components:      Result Value   RBC 5.34 (*)    Hemoglobin 15.1 (*)    HCT 48.4 (*)    Platelets 124 (*)    Lymphs Abs 0.6 (*)    All other components within normal limits  COMPREHENSIVE METABOLIC PANEL - Abnormal; Notable for the following components:   Potassium 2.9 (*)    Glucose, Bld 105 (*)    Calcium 8.7 (*)    All other components within normal limits  C-REACTIVE PROTEIN - Abnormal; Notable for the following components:   CRP 1.4 (*)    All other components within normal limits  POC SARS CORONAVIRUS 2 AG -  ED - Abnormal; Notable for the following components:   SARS Coronavirus 2 Ag POSITIVE (*)    All other components within normal limits  CULTURE, BLOOD (ROUTINE X 2)  CULTURE, BLOOD (ROUTINE X 2)  LACTIC ACID, PLASMA  D-DIMER, QUANTITATIVE (NOT AT [facility])  LACTATE DEHYDROGENASE  FERRITIN  TRIGLYCERIDES  FIBRINOGEN  LACTIC ACID, PLASMA  PROCALCITONIN    EKG None  Radiology DG Chest Port 1 View  Result Date: 07/14/2019 CLINICAL DATA:  Cough. EXAM: PORTABLE CHEST 1 VIEW COMPARISON:  August 25, 2018. FINDINGS: Stable cardiomegaly. No pneumothorax or pleural effusion is noted. Minimal bibasilar subsegmental atelectasis is noted. Bony thorax is unremarkable. IMPRESSION: Minimal bibasilar subsegmental atelectasis. Electronically Signed   By: Lupita Raider M.D.   On: 07/14/2019 15:03    Procedures .Critical Care Performed by: Sherene Sires, PA-C Authorized by: Sherene Sires, PA-C   Critical care provider statement:    Critical care time (minutes):  33   Critical care time was exclusive of:  Separately billable procedures and treating other patients and teaching time   Critical care was necessary to treat or prevent imminent or life-threatening deterioration of the following conditions: acute hypoxia secondary to covid.   Critical care was time spent personally by me on the  following activities:  Blood draw for specimens, development of treatment plan with patient or surrogate, discussions with consultants, discussions with primary provider, evaluation of patient's response to treatment, examination of patient, ordering and performing treatments and interventions, ordering and review of laboratory studies, ordering and review of radiographic studies, pulse oximetry, re-evaluation of patient's condition, review of old charts and obtaining history from patient or surrogate   (including critical care time)  Medications Ordered in ED Medications  remdesivir 100 mg in sodium chloride 0.9 % 100 mL IVPB (100 mg Intravenous New Bag/Given 07/14/19 1600)    Followed by  remdesivir 100 mg in sodium chloride 0.9 % 100 mL IVPB (has no administration in time range)  potassium chloride 10 mEq in 100 mL IVPB (10 mEq Intravenous New Bag/Given 07/14/19 1554)  dexamethasone (DECADRON) injection 10 mg (10 mg Intravenous Given 07/14/19 1555)  potassium chloride SA (KLOR-CON) CR tablet 40 mEq (40 mEq Oral Given 07/14/19 1559)    ED Course  I have reviewed the triage vital signs and the nursing notes.  Pertinent labs & imaging results that were available during my care of the patient were reviewed by me and considered in my medical decision making (see chart for details).    MDM Rules/Calculators/A&P  Patient seen and examined. Patient presents awake, alert, hemodynamically stable, afebrile, non toxic.  Patient is presenting from [facility] assisted living.  She unfortunately has a positive Covid test today.  She did have both doses of vaccine several months ago does not remember exactly when.  On exam she is hypoxic to 88% when talking.  Was placed on 2 L nasal cannula with improvement to 95%.  She has rales heard in bilateral lung fields.  She is tachypneic. Chest x-ray viewed by me shows atelectasis but no signs of infection.  Labs here without leukocytosis.  She  does appear to be dehydrated as she is hemoconcentrated.  CMP shows hypokalemia, will replace with 1 run of IV and p.o.  No other severe electrolyte derangements.  Suspect Covid admission labs ordered.  Inflammatory markers are within normal range.  IV Decadron and on December ordered.  This case was discussed with Dr. Rubin Payor who has seen the patient and agrees with plan to admit. Spoke with Dr. Pola Corn with hospitalist service who agrees to assume care of patient and bring into the hospital for further evaluation and management.      Portions of this note were generated with Scientist, clinical (histocompatibility and immunogenetics). Dictation errors may occur despite best attempts at proofreading.  . Final Clinical Impression(s) / ED Diagnoses Final diagnoses:  COVID-19 virus infection    Rx / DC Orders ED Discharge Orders    None       Kathyrn Lass 07/14/19 1605    Benjiman Core, MD 07/16/19 [phone]

## 2019-07-14 NOTE — ED Triage Notes (Signed)
Pt presents from [facility] with c/o Covid positive. Pt has a hx of recurring pneumonia. Pt has been coughing since this morning, staff did a rapid Covid swab and pt was positive. Pt was 93% on RA. Pt reporting a headache but no other complaints at this time. Per EMS, pt has some rhonchi when coughing. Pt denies any N/V or chest pain. Pt was febrile, 100 degrees for EMS.

## 2019-07-14 NOTE — ED Notes (Addendum)
Bed assigned 1652. Still no bed ready marked at 1810. Attempting report.

## 2019-07-14 NOTE — ED Notes (Signed)
Unsuccessful attempt x 3 for IV start.

## 2019-07-14 NOTE — H&P (Signed)
[facility] History and Physical  Paula Bird OVF:[id number] DOB: Aug 21, 1925 DOA: 07/14/2019 PCP: Angela Cox, MD  Admitted from: [facility] Chief Complaint: Hypoxia, weakness  History of Present Illness: Paula Bird is a 86 y.o. female with PMH significant for HTN, A. fib, CKD, COPD not on home oxygen, GERD, dysphagia, hypothyroidism, iron deficiency anemia, irritable bowel syndrome, depression and impaired mobility who has been living at [facility] for last 5 years.  Patient has history of esophageal dysmotility and dysphagia leading to recurrent pneumonia.  Patient received both doses of Covid vaccines.   She has chronic cough which is lately been getting worse for last 2 to 3 days.  She has progressively worsening shortness of breath, worse with exertion.  Also has generalized body aches.  Staff did rapid Covid swab which turned out to be positive.  EMS was called.  Patient was noted to be hypoxic at 88%, temperature 100 and hence brought to the ED.  In the ED, patient was afebrile, heart rate in the 90s, blood pressure 147/76, oxygen saturation 92% on 2 L by nasal cannula. Labs showed sodium 141, potassium low at 2.9, BUN/creatinine 9/0.48. Covid antigen test positive. Inflammatory markers are not significantly elevated.  Normal LDH, ferritin, Procalcitonin, WBC count, D-dimer.  Slightly elevated CRP at 1.4. Chest x-ray showed only minimal bibasilar subsegmental atelectasis. Hospitalist service was consulted for further evaluation and management.  At the time of my evaluation, patient was lying down in bed.  She had a congested facial look.  She felt very tired. Patient states to me that for last 2 months she has been progressively declining physically and is now wheelchair-bound.  Review of Systems:  All systems were reviewed and were negative unless otherwise mentioned in the HPI   Past medical history: Past Medical  History:  Diagnosis Date  . Acute and chronic respiratory failure, unspecified whether with hypoxia or hypercapnia (HCC)   . Afib (HCC)   . Allergic rhinitis   . Anemia   . CKD (chronic kidney disease)   . Constipation   . COPD (chronic obstructive pulmonary disease) (HCC)   . Depression   . Diplopia   . Dysphagia   . Edema, lower extremity   . Esophageal dysmotility   . Gait instability   . GERD (gastroesophageal reflux disease)   . Hypertension   . Hypothyroidism   . IBS (irritable bowel syndrome)   . IDA (iron deficiency anemia)   . Insomnia   . Leukocytosis   . Myasthenia gravis (HCC)   . Oral thrush   . PAF (paroxysmal atrial fibrillation) (HCC)   . Physical deconditioning   . Pneumonia 02/2017  . Protein calorie malnutrition (HCC)   . Thyroid disease   . Thyroid mass   . Vertigo     Past surgical history: Past Surgical History:  Procedure Laterality Date  . ABDOMINAL HYSTERECTOMY    . APPENDECTOMY    . ESOPHAGOGASTRODUODENOSCOPY (EGD) WITH PROPOFOL N/A 07/21/2017   Procedure: ESOPHAGOGASTRODUODENOSCOPY (EGD) WITH PROPOFOL;  Surgeon: Kerin Salen, MD;  Location: [facility];  Service: Gastroenterology;  Laterality: N/A;  . TOTAL HIP ARTHROPLASTY Left   . TOTAL KNEE ARTHROPLASTY Left     Social History:  reports that she has never smoked. She has never used smokeless tobacco. She reports that she does not drink alcohol or use drugs.  Allergies:  No Known Allergies  Family history:  Family History  Problem Relation Age of Onset  . Hypertension Mother   .  Hypertension Son   . Diabetes Neg Hx   . Thyroid disease Neg Hx      Home Meds: Prior to Admission medications   Medication Sig Start Date End Date Taking? Authorizing Provider  amiodarone (PACERONE) 200 MG tablet Take 200 mg by mouth daily.  06/07/17  Yes [provider]  amitriptyline (ELAVIL) 50 MG tablet Take 100 mg by mouth at bedtime. 2 tabs (100mg ) at hs 06/22/17  Yes [provider]  bisacodyl (DULCOLAX) 10 MG suppository Place 10 mg rectally daily as needed for moderate constipation.   Yes [provider]  BREO ELLIPTA 100-25 MCG/INH AEPB Take 1 puff by mouth daily. 06/16/18  Yes [provider]  diltiazem (CARDIZEM LA) 300 MG 24 hr tablet Take 300 mg by mouth daily. 03/04/16  Yes [provider]  esomeprazole (NEXIUM) 40 MG capsule Take 40 mg by mouth daily. 06/11/18  Yes [provider]  ferrous sulfate 325 (65 FE) MG tablet Take 325 mg by mouth daily with breakfast.   Yes [provider]  HYDROcodone-acetaminophen (NORCO/VICODIN) 5-325 MG tablet Take 1 tablet by mouth every 4 (four) hours as needed for moderate pain.    Yes [provider]  hydrOXYzine (ATARAX/VISTARIL) 25 MG tablet Take 25 mg by mouth every 8 (eight) hours as needed for anxiety.    Yes [provider]  INCRUSE ELLIPTA 62.5 MCG/INH AEPB Take 1 puff by mouth daily. 06/10/17  Yes [provider]  linaclotide (LINZESS) 145 MCG CAPS capsule Take 145 mcg by mouth daily before breakfast.   Yes [provider]  magnesium citrate SOLN Take 1 Bottle by mouth daily as needed for moderate constipation. 256ml q24h prn constipation    Yes [provider]  methimazole (TAPAZOLE) 10 MG tablet Take 10 mg by mouth daily.  06/16/18  Yes [provider]  Multiple Vitamin (MULTIVITAMIN WITH MINERALS) TABS tablet Take 1 tablet by mouth daily.   Yes [provider]  Multiple Vitamins-Minerals (MULTIVITAMIN ADULT PO) Take 2 capsules by mouth daily. [employer] AREDS 2 Cap 2 caps qd    Yes [provider]  mycophenolate (CELLCEPT) 500 MG tablet Take 1 tablet (500 mg total) by mouth 2 (two) times daily. 11/27/18  Yes Marcial Pacas, MD  ondansetron (ZOFRAN) 8 MG tablet Take 8 mg by mouth every 8 (eight) hours as needed for nausea or vomiting.   Yes [provider]  polyethylene glycol (MIRALAX /  GLYCOLAX) packet Take 17 g by mouth daily. Patient taking differently: Take 17 g by mouth 2 (two) times daily.  10/22/17  Yes Patrecia Pour, Christean Grief, MD  polyvinyl alcohol (ARTIFICIAL TEARS) 1.4 % ophthalmic solution Place 1 drop into both eyes 3 (three) times daily.   Yes [provider]  senna (SENOKOT) 8.6 MG tablet Take 2 tablets by mouth daily. senolot S 8.6-50mg  qd   Yes [provider]  sucralfate (CARAFATE) 1 g tablet Take 1 g by mouth 4 (four) times daily. 06/16/18  Yes [provider]  SUMAtriptan (IMITREX) 50 MG tablet Take 50 mg by mouth daily as needed for migraine. 1 tab every 24 hours as needed for migraine. Limit to 10 days /month to avoid medication overuse.   Yes [provider]  zolpidem (AMBIEN) 10 MG tablet Take 10 mg by mouth at bedtime.    Yes [provider]    Physical Exam: Vitals:   07/14/19 1252 07/14/19 1600 07/14/19 1700  BP: (!) 147/76 128/82 133/87  Pulse: 95 99 97  Resp: 18 20 (!) 22  Temp: 98.7 F (37.1 C)    TempSrc: Oral    SpO2: 100% 98% 97%   Wt Readings from Last 3 Encounters:  06/26/19 67.6 kg  11/27/18 65.3 kg  08/25/18 64.9 kg   There is no height or weight on file to calculate BMI.  General exam: Mild respiratory distress.  Congested facial look Skin: No rashes, lesions or ulcers. HEENT: Atraumatic, normocephalic, supple neck, no obvious bleeding Lungs: Clear to auscultation bilaterally CVS: Regular rate and rhythm, no murmur GI/Abd soft, nontender, nondistended, bowel sound present CNS: Alert, awake, oriented to place and person, slow to respond but able to tell the date as well.   Psychiatry: Depressed look Extremities: No pedal edema, no calf tenderness     Consult Orders  (From admission, onward)         Start     Ordered   07/14/19 1521  Consult to hospitalist  ALL PATIENTS BEING ADMITTED/HAVING PROCEDURES NEED COVID-19 SCREENING  Once    Comments: ALL PATIENTS BEING ADMITTED/HAVING  PROCEDURES NEED COVID-19 SCREENING  Provider:  (Not yet assigned)  Question Answer Comment  Place call to: [facility]   Reason for Consult Admit      07/14/19 1543          Labs on Admission:   CBC: Recent Labs  Lab 07/14/19 1357  WBC 6.3  NEUTROABS 4.9  HGB 15.1*  HCT 48.4*  MCV 90.6  PLT 124*    Basic Metabolic Panel: Recent Labs  Lab 07/14/19 1357  NA 141  K 2.9*  CL 102  CO2 26  GLUCOSE 105*  BUN 9  CREATININE 0.48  CALCIUM 8.7*    Liver Function Tests: Recent Labs  Lab 07/14/19 1357  AST 18  ALT 19  ALKPHOS 82  BILITOT 0.6  PROT 7.2  ALBUMIN 3.8   No results for input(s): LIPASE, AMYLASE in the last 168 hours. No results for input(s): AMMONIA in the last 168 hours.  Cardiac Enzymes: No results for input(s): CKTOTAL, CKMB, CKMBINDEX, TROPONINI in the last 168 hours.  BNP (last 3 results) No results for input(s): BNP in the last 8760 hours.  ProBNP (last 3 results) No results for input(s): PROBNP in the last 8760 hours.  CBG: No results for input(s): GLUCAP in the last 168 hours.  Lipase     Component Value Date/Time   LIPASE 23 07/04/2017 1831     Urinalysis    Component Value Date/Time   COLORURINE YELLOW 08/25/2018 1157   APPEARANCEUR CLEAR 08/25/2018 1157   LABSPEC 1.011 08/25/2018 1157   PHURINE 7.0 08/25/2018 1157   GLUCOSEU NEGATIVE 08/25/2018 1157   HGBUR NEGATIVE 08/25/2018 1157   BILIRUBINUR NEGATIVE 08/25/2018 1157   KETONESUR NEGATIVE 08/25/2018 1157   PROTEINUR NEGATIVE 08/25/2018 1157   UROBILINOGEN 0.2 08/13/2014 2256   NITRITE NEGATIVE 08/25/2018 1157   LEUKOCYTESUR TRACE (A) 08/25/2018 1157     Drugs of Abuse  No results found for: LABOPIA, COCAINSCRNUR, LABBENZ, AMPHETMU, THCU, LABBARB    Radiological Exams on Admission: DG Chest Port 1 View  Result Date: 07/14/2019 CLINICAL DATA:  Cough. EXAM: PORTABLE CHEST 1 VIEW COMPARISON:  August 25, 2018. FINDINGS: Stable cardiomegaly. No pneumothorax or  pleural effusion is noted. Minimal bibasilar subsegmental atelectasis is noted. Bony thorax is unremarkable. IMPRESSION: Minimal bibasilar subsegmental atelectasis. Electronically Signed   By: Lupita Raider M.D.   On: 07/14/2019 15:03     ------------------------------------------------------------------------------------------------------ Assessment/Plan:  Active Problems:   COVID-19 virus infection  COVID infection Acute respiratory failure with hypoxia - 2 LPM -Presented with cough, shortness of breath on exertion -COVID test: Covid antigen positive -Chest imaging -no infiltrates on chest x-ray -Treatment: Because of presence of hypoxia and high risk of decompensation at an advanced age, I decided to start the patient on Decadron 6 mg daily for 10 days as well as IV Remdesivir for 5 days to complete on 4/24. -Supportive care: Vitamin C, Zinc, inhalers, Tylenol, Antitussives - benzonatate, Mucinex.  IV Protonix while on IV Decadron. -Wean down oxygen supplementation as tolerated -Continue airborne/contact isolation precautions. -WBC and inflammatory markers trend as below.  Recent Labs  Lab 07/14/19 1357  WBC 6.3   Recent Labs    07/14/19 1357  DDIMER 0.42  FERRITIN 94  LDH 170  CRP 1.4*   Generalized weakness -Weakness is her primary symptom. -Due to combination of Covid infection, poor intake as well as physical deconditioning. -PT eval ordered.  Hypokalemia -Potassium level low at 2.9.  Replaced in the ED.  Recheck in the morning.  Cardiovascular issues: HTN, A. fib -Home meds include Amiodarone, Cardizem. -Resume the same.  Depression/anxiety -Continue amitriptyline, hydroxyzine, Ambien,  COPD -Continue Breo Ellipta, Incruse Ellipta  GERD/esophageal dysmotility/chronic anemia -Continue Nexium, iron tablets, sucralfate,  ?Hypothyroidism/hyperthyroidism Hypothyroidism as mentioned in patient's chart but I see methimazole on the home list.  Obtain  TSH.  ?CellCept  Code Status:  DNR/DNI  DVT prophylaxis:  Lovenox Antimicrobials:  IV remdesivir Fluid: None Diet: Cardiac diet  Consultants: None Family Communication:  Called and updated patient's son Mr. Brighten Buzzelli ([phone])  Status is: Inpatient  Remains inpatient appropriate because:IV treatments appropriate due to intensity of illness or inability to take PO   Dispo: The patient is from: ALF              Anticipated d/c is to: ALF/SNF based on PT eval              Anticipated d/c date is: > 3 days, could be shorter if hypoxia improves and patient can follow-up in remdesivir clinic as an outpatient              Patient currently is not medically stable to d/c.   ----------------------------------------------------------------------------------------------------------------------------------------------------------- Severity of Illness: The appropriate patient status for this patient is INPATIENT. Inpatient status is judged to be reasonable and necessary in order to provide the required intensity of service to ensure the patient's safety. The patient's presenting symptoms, physical exam findings, and initial radiographic and laboratory data in the context of their chronic comorbidities is felt to place them at high risk for further clinical deterioration. Furthermore, it is not anticipated that the patient will be medically stable for discharge from the hospital within 2 midnights of admission. The following factors support the patient status of inpatient.   " The patient's presenting symptoms include shortness of breath. " The worrisome physical exam findings include lethargy, hypoxia. " The initial radiographic and laboratory data are worrisome because of hypoxia. " The chronic co-morbidities include advanced age.   * I certify that at the point of admission it is my clinical judgment that the patient will require inpatient hospital care spanning beyond 2 midnights  from the point of admission due to high intensity of service, high risk for further deterioration and high frequency of surveillance required.*  -----------------------------------------------------------------------------------------------------  Mack Hook, MD [facility] Pager: [phone] (Secure Chat preferred). 07/14/2019

## 2019-07-14 NOTE — ED Notes (Signed)
Paula Bird (son/poa) [phone]

## 2019-07-14 NOTE — Plan of Care (Signed)
Initiated care plan 

## 2019-07-15 ENCOUNTER — Other Ambulatory Visit: Payer: Self-pay

## 2019-07-15 LAB — TSH: TSH: 17.448 u[IU]/mL — ABNORMAL HIGH (ref 0.350–4.500)

## 2019-07-15 LAB — CBC WITH DIFFERENTIAL/PLATELET
Abs Immature Granulocytes: 0.02 10*3/uL (ref 0.00–0.07)
Basophils Absolute: 0 10*3/uL (ref 0.0–0.1)
Basophils Relative: 0 %
Eosinophils Absolute: 0 10*3/uL (ref 0.0–0.5)
Eosinophils Relative: 0 %
HCT: 48.4 % — ABNORMAL HIGH (ref 36.0–46.0)
Hemoglobin: 14.8 g/dL (ref 12.0–15.0)
Immature Granulocytes: 1 %
Lymphocytes Relative: 11 %
Lymphs Abs: 0.4 10*3/uL — ABNORMAL LOW (ref 0.7–4.0)
MCH: 28 pg (ref 26.0–34.0)
MCHC: 30.6 g/dL (ref 30.0–36.0)
MCV: 91.7 fL (ref 80.0–100.0)
Monocytes Absolute: 0.1 10*3/uL (ref 0.1–1.0)
Monocytes Relative: 2 %
Neutro Abs: 2.8 10*3/uL (ref 1.7–7.7)
Neutrophils Relative %: 86 %
Platelets: 132 10*3/uL — ABNORMAL LOW (ref 150–400)
RBC: 5.28 MIL/uL — ABNORMAL HIGH (ref 3.87–5.11)
RDW: 14.9 % (ref 11.5–15.5)
WBC: 3.2 10*3/uL — ABNORMAL LOW (ref 4.0–10.5)
nRBC: 0 % (ref 0.0–0.2)

## 2019-07-15 LAB — COMPREHENSIVE METABOLIC PANEL
ALT: 59 U/L — ABNORMAL HIGH (ref 0–44)
AST: 48 U/L — ABNORMAL HIGH (ref 15–41)
Albumin: 3.4 g/dL — ABNORMAL LOW (ref 3.5–5.0)
Alkaline Phosphatase: 76 U/L (ref 38–126)
Anion gap: 8 (ref 5–15)
BUN: 13 mg/dL (ref 8–23)
CO2: 25 mmol/L (ref 22–32)
Calcium: 8.6 mg/dL — ABNORMAL LOW (ref 8.9–10.3)
Chloride: 108 mmol/L (ref 98–111)
Creatinine, Ser: 0.45 mg/dL (ref 0.44–1.00)
GFR calc Af Amer: >60 mL/min (ref >60)
GFR calc non Af Amer: >60 mL/min (ref >60)
Glucose, Bld: 155 mg/dL — ABNORMAL HIGH (ref 70–99)
Potassium: 3 mmol/L — ABNORMAL LOW (ref 3.5–5.1)
Sodium: 141 mmol/L (ref 135–145)
Total Bilirubin: 0.5 mg/dL (ref 0.3–1.2)
Total Protein: 6.8 g/dL (ref 6.5–8.1)

## 2019-07-15 LAB — C-REACTIVE PROTEIN: CRP: 1.6 mg/dL — ABNORMAL HIGH (ref <1.0)

## 2019-07-15 LAB — D-DIMER, QUANTITATIVE: D-Dimer, Quant: <0.27 ug/mL-FEU (ref 0.00–0.50)

## 2019-07-15 LAB — GLUCOSE, CAPILLARY
Glucose-Capillary: 204 mg/dL — ABNORMAL HIGH (ref 70–99)
Glucose-Capillary: 99 mg/dL (ref 70–99)
Glucose-Capillary: 165 mg/dL — ABNORMAL HIGH (ref 70–99)

## 2019-07-15 LAB — FERRITIN: Ferritin: 110 ng/mL (ref 11–307)

## 2019-07-15 LAB — T4: T4, Total: 3.2 ug/dL — ABNORMAL LOW (ref 4.5–12.0)

## 2019-07-15 MED ORDER — MYCOPHENOLATE MOFETIL 250 MG PO CAPS
500.0000 mg | ORAL_CAPSULE | Freq: Two times a day (BID) | ORAL | Status: DC
  Administered 2019-07-15 – 2019-07-18 (×6): 500 mg via ORAL
  Filled 2019-07-15 (×7): qty 2

## 2019-07-15 MED ORDER — MYCOPHENOLATE MOFETIL 500 MG PO TABS: 500.0000 mg | ORAL_TABLET | Freq: Two times a day (BID) | ORAL | Status: DC

## 2019-07-15 MED ORDER — AMIODARONE HCL 200 MG PO TABS
200.0000 mg | ORAL_TABLET | Freq: Every day | ORAL | Status: DC
  Administered 2019-07-15 – 2019-07-18 (×4): 200 mg via ORAL
  Filled 2019-07-15 (×4): qty 1

## 2019-07-15 MED ORDER — HYDROXYZINE HCL 25 MG PO TABS
25.0000 mg | ORAL_TABLET | Freq: Three times a day (TID) | ORAL | Status: DC | PRN
  Administered 2019-07-15 – 2019-07-16 (×3): 25 mg via ORAL
  Filled 2019-07-15 (×3): qty 1

## 2019-07-15 MED ORDER — DILTIAZEM HCL ER COATED BEADS 180 MG PO CP24
300.0000 mg | ORAL_CAPSULE | Freq: Every day | ORAL | Status: DC
  Administered 2019-07-16 – 2019-07-18 (×3): 300 mg via ORAL
  Filled 2019-07-15 (×3): qty 1

## 2019-07-15 MED ORDER — ZOLPIDEM TARTRATE 5 MG PO TABS: 5.0000 mg | ORAL_TABLET | Freq: Every day | ORAL | Status: DC

## 2019-07-15 MED ORDER — UMECLIDINIUM BROMIDE 62.5 MCG/INH IN AEPB
1.0000 | INHALATION_SPRAY | Freq: Every day | RESPIRATORY_TRACT | Status: DC
  Administered 2019-07-16 – 2019-07-18 (×3): 1 via RESPIRATORY_TRACT
  Filled 2019-07-15: qty 7

## 2019-07-15 MED ORDER — SUCRALFATE 1 G PO TABS
1.0000 g | ORAL_TABLET | Freq: Four times a day (QID) | ORAL | Status: DC
  Administered 2019-07-15 – 2019-07-18 (×10): 1 g via ORAL
  Filled 2019-07-15 (×10): qty 1

## 2019-07-15 MED ORDER — FLUTICASONE FUROATE-VILANTEROL 100-25 MCG/INH IN AEPB
1.0000 | INHALATION_SPRAY | Freq: Every day | RESPIRATORY_TRACT | Status: DC
  Administered 2019-07-16 – 2019-07-18 (×3): 1 via RESPIRATORY_TRACT
  Filled 2019-07-15: qty 28

## 2019-07-15 MED ORDER — LINACLOTIDE 145 MCG PO CAPS
145.0000 ug | ORAL_CAPSULE | Freq: Every day | ORAL | Status: DC
  Administered 2019-07-16 – 2019-07-18 (×3): 145 ug via ORAL
  Filled 2019-07-15 (×3): qty 1

## 2019-07-15 MED ORDER — POTASSIUM CHLORIDE CRYS ER 20 MEQ PO TBCR
40.0000 meq | EXTENDED_RELEASE_TABLET | Freq: Once | ORAL | Status: AC
  Administered 2019-07-15: 20:00:00 40 meq via ORAL
  Filled 2019-07-15: qty 2

## 2019-07-15 MED ORDER — METHIMAZOLE 10 MG PO TABS
10.0000 mg | ORAL_TABLET | Freq: Every day | ORAL | Status: DC
  Administered 2019-07-15 – 2019-07-18 (×4): 10 mg via ORAL
  Filled 2019-07-15 (×4): qty 1

## 2019-07-15 MED ORDER — DILTIAZEM HCL ER COATED BEADS 300 MG PO TB24: 300.0000 mg | ORAL_TABLET | Freq: Every day | ORAL | Status: DC

## 2019-07-15 MED ORDER — AMITRIPTYLINE HCL 100 MG PO TABS
100.0000 mg | ORAL_TABLET | Freq: Every day | ORAL | Status: DC
  Administered 2019-07-15: 100 mg via ORAL
  Filled 2019-07-15: qty 1

## 2019-07-15 NOTE — Progress Notes (Signed)
RN Lorelle Formosa attempt to place IV, site blew, pt with a lot of scarring BL arms, will put in order for IV team

## 2019-07-15 NOTE — Progress Notes (Signed)
PROGRESS NOTE    Angellina Ferdinand  KKX:[id number] DOB: 04/07/1925 DOA: 07/14/2019 PCP: Merlene Laughter, MD      Brief Narrative:  Mrs. Isip is a 86 y.o. F with hx COPD not on home oxygen, HTN, A. fib, CKD, hypothyroidism, iron deficiency anemia, and depression who presented from ALF with worsening cough, shortness of breath, body aches.  In the ER Covid positive, chest x-ray showed slight opacities bilaterally.  Overnight requiring oxygen.      Assessment & Plan:  Acute hypoxic respiratory failure due to Covid pneumonitis -Continue remdesivir -Continue steroids  COPD -Continue albuterol every 6 -Continue pantoprazole Sinew Incruse and Dulera  Diabetes -Continue sliding scale corrections  Hypertension -Continue diltiazem  A. fib Not on anticoagulant -Resume amiodarone, diltiazem  CKD Stable relative to baseline  Anemia -Hold iron  Thyroid disease -Continue Tapazole daily -Continue CellCept  Depression, anxiety -Hold Elavil -Ambien at night -Atarax for anxiety  IBS/GERD -Continue Protonix, Linzess, sucralfate  Hypokalemia -Continue potassium supplement       Disposition: Status is: Inpatient  Remains inpatient appropriate because:Ongoing oxygen requirement   Dispo: The patient is from: ALF              Anticipated d/c is to: ALF              Anticipated d/c date is: 3 days              Patient currently is not medically stable to d/c.               MDM: The below labs and imaging reports were reviewed and summarized above.  Medication management as above.   DVT prophylaxis: Lovenox Code Status: DO NOT RESUSCITATE Family Communication: Daughter by phone    Consultants:   Procedures:     Antimicrobials:      Culture data:              Subjective: Patient is tired, she has heartburn, she has poor appetite.  No confusion, respiratory distress, fever.  Objective: Vitals:   07/14/19 2146  07/15/19 0500 07/15/19 0610 07/15/19 1210  BP: 131/78  (!) 133/109 (!) 141/91  Pulse: 81  (!) 106 91  Resp: 16  19 15   Temp: 98.2 F (36.8 C)  98.7 F (37.1 C) 98.4 F (36.9 C)  TempSrc: Oral   Oral  SpO2: 90%  93% 96%  Weight:  67 kg    Height:  5' (1.524 m)      Intake/Output Summary (Last 24 hours) at 07/15/2019 1211 Last data filed at 07/14/2019 2100 Gross per 24 hour  Intake 120 ml  Output --  Net 120 ml   Filed Weights   07/15/19 0500  Weight: 67 kg    Examination: General appearance: Overly obese adult female, alert and in no acute distress.  Appears tired, and up in recliner HEENT: Anicteric, conjunctiva pink, lids and lashes normal. No nasal deformity, discharge, epistaxis.  Lips moist.   Skin: Warm and dry.  No jaundice.  No suspicious rashes or lesions. Cardiac: RRR, nl S1-S2, no murmurs appreciated.  Capillary refill is brisk.  JVP normal No LE edema.  Radial  pulses 2+ and symmetric. Respiratory: Normal respiratory rate and rhythm.  CTAB without rales or wheezes. Abdomen: Abdomen soft.  No TTP or guarding. No ascites, distension, hepatosplenomegaly.   MSK: No deformities or effusions. Neuro: Awake and alert.  EOMI, moves all extremities. Speech fluent.    Psych: Sensorium intact and responding to questions,  attention normal. Affect anxious.  Judgment and insight appear normal.    Data Reviewed: I have personally reviewed following labs and imaging studies:  CBC: Recent Labs  Lab 07/14/19 1357 07/15/19 0415  WBC 6.3 3.2*  NEUTROABS 4.9 2.8  HGB 15.1* 14.8  HCT 48.4* 48.4*  MCV 90.6 91.7  PLT 124* 132*   Basic Metabolic Panel: Recent Labs  Lab 07/14/19 1357 07/15/19 0415  NA 141 141  K 2.9* 3.0*  CL 102 108  CO2 26 25  GLUCOSE 105* 155*  BUN 9 13  CREATININE 0.48 0.45  CALCIUM 8.7* 8.6*   GFR: Estimated Creatinine Clearance: 39.1 mL/min (by C-G formula based on SCr of 0.45 mg/dL). Liver Function Tests: Recent Labs  Lab 07/14/19 1357  07/15/19 0415  AST 18 48*  ALT 19 59*  ALKPHOS 82 76  BILITOT 0.6 0.5  PROT 7.2 6.8  ALBUMIN 3.8 3.4*   No results for input(s): LIPASE, AMYLASE in the last 168 hours. No results for input(s): AMMONIA in the last 168 hours. Coagulation Profile: No results for input(s): INR, PROTIME in the last 168 hours. Cardiac Enzymes: No results for input(s): CKTOTAL, CKMB, CKMBINDEX, TROPONINI in the last 168 hours. BNP (last 3 results) No results for input(s): PROBNP in the last 8760 hours. HbA1C: No results for input(s): HGBA1C in the last 72 hours. CBG: Recent Labs  Lab 07/14/19 2144 07/15/19 1143  GLUCAP 166* 204*   Lipid Profile: Recent Labs    07/14/19 1357  TRIG 61   Thyroid Function Tests: Recent Labs    07/14/19 1357  TSH 17.448*   Anemia Panel: Recent Labs    07/14/19 1357 07/15/19 0415  FERRITIN 94 110   Urine analysis:    Component Value Date/Time   COLORURINE YELLOW 08/25/2018 1157   APPEARANCEUR CLEAR 08/25/2018 1157   LABSPEC 1.011 08/25/2018 1157   PHURINE 7.0 08/25/2018 1157   GLUCOSEU NEGATIVE 08/25/2018 1157   HGBUR NEGATIVE 08/25/2018 1157   BILIRUBINUR NEGATIVE 08/25/2018 1157   KETONESUR NEGATIVE 08/25/2018 1157   PROTEINUR NEGATIVE 08/25/2018 1157   UROBILINOGEN 0.2 08/13/2014 2256   NITRITE NEGATIVE 08/25/2018 1157   LEUKOCYTESUR TRACE (A) 08/25/2018 1157   Sepsis Labs: @LABRCNTIP (procalcitonin:4,lacticacidven:4)  ) Recent Results (from the past 240 hour(s))  Blood Culture (routine x 2)     Status: None (Preliminary result)   Collection Time: 07/14/19  1:09 PM   Specimen: BLOOD RIGHT HAND  Result Value Ref Range Status   Specimen Description   Final    BLOOD RIGHT HAND Performed at [facility], [address] [place], [place]    Special Requests   Final    BOTTLES DRAWN AEROBIC AND ANAEROBIC Blood Culture adequate volume Performed at [facility], [address], [place],  [place]    Culture   Final    NO GROWTH < 24 HOURS Performed at [facility], [address], [place], [place]    Report Status PENDING  Incomplete  Blood Culture (routine x 2)     Status: None (Preliminary result)   Collection Time: 07/14/19  3:59 PM   Specimen: BLOOD  Result Value Ref Range Status   Specimen Description   Final    BLOOD RIGHT ANTECUBITAL Performed at [facility], [address] [place], [place]    Special Requests   Final    BOTTLES DRAWN AEROBIC AND ANAEROBIC Blood Culture results may not be optimal due  to an inadequate volume of blood received in culture bottles Performed at [facility], [address] [place], [place] [address]    Culture   Final    NO GROWTH < 12 HOURS Performed at [facility], [address] [place], [place] [address]    Report Status PENDING  Incomplete         Radiology Studies: DG Chest Port 1 View  Result Date: 07/14/2019 CLINICAL DATA:  Cough. EXAM: PORTABLE CHEST 1 VIEW COMPARISON:  August 25, 2018. FINDINGS: Stable cardiomegaly. No pneumothorax or pleural effusion is noted. Minimal bibasilar subsegmental atelectasis is noted. Bony thorax is unremarkable. IMPRESSION: Minimal bibasilar subsegmental atelectasis. Electronically Signed   By: Lupita Raider M.D.   On: 07/14/2019 15:03        Scheduled Meds: . albuterol  2 puff Inhalation Q6H  . vitamin C  500 mg Oral Daily  . dexamethasone (DECADRON) injection  6 mg Intravenous Q24H  . enoxaparin (LOVENOX) injection  40 mg Subcutaneous Q24H  . insulin aspart  0-5 Units Subcutaneous QHS  . insulin aspart  0-9 Units Subcutaneous TID WC  . pantoprazole (PROTONIX) IV  40 mg Intravenous Q12H  . zinc sulfate  220 mg Oral Daily   Continuous Infusions: . remdesivir 100 mg in NS 100 mL 100 mg (07/15/19 1000)     LOS: 1 day    Time spent: 25 minutes    Alberteen Sam, MD [facility] 07/15/2019, 12:11 PM     Please page though AMION or Epic secure chat:  For [employer], Higher education careers adviser

## 2019-07-15 NOTE — Plan of Care (Signed)
  Problem: Education: Goal: Knowledge of risk factors and measures for prevention of condition will improve Outcome: Progressing   Problem: Coping: Goal: Psychosocial and spiritual needs will be supported Outcome: Progressing   Problem: Respiratory: Goal: Will maintain a patent airway Outcome: Progressing Goal: Complications related to the disease process, condition or treatment will be avoided or minimized Outcome: Progressing   

## 2019-07-15 NOTE — Evaluation (Signed)
Physical Therapy Evaluation Patient Details Name: Paula Bird MRN: [id number] DOB: April 08, 1925 Today's Date: 07/15/2019   History of Present Illness  Paula Bird is a 86 y.o. female with PMH significant for HTN, A. fib,myasthenia gravis, vision loss, CKD, COPD not on home oxygen, GERD, dysphagia, hypothyroidism, iron deficiency anemia, irritable bowel syndrome, depression and impaired mobility who has been living at [facility] assisted [facility] for last 5 years, currently WC dependent. Patient has history of esophageal dysmotility and dysphagia leading to recurrent pneumonia.  Patient received both doses of Covid vaccines  .She has progressively worsening shortness of breath, worse with exertion.  Also has generalized body aches. Positive Covid.  Clinical Impression  The patient is very pleasant and able to mobilize from bed to [facility], then to recliner on RA with SPO2 >92%. Patient comes form ALF where she reports that she is modified independent for transfers and mobilizes in a WC. The patient should be able to return to the ALF. Pt admitted with above diagnosis. Pt currently with functional limitations due to the deficits listed below (see PT Problem List). Pt will benefit from skilled PT to increase their independence and safety with mobility to allow discharge to the venue listed below.       Follow Up Recommendations Home health PT(return to AL:F)    Equipment Recommendations  None recommended by PT    Recommendations for Other Services       Precautions / Restrictions Precautions Precautions: Fall      Mobility  Bed Mobility Overal bed mobility: Needs Assistance Bed Mobility: Supine to Sit     Supine to sit: Min guard;HOB elevated     General bed mobility comments: extra time, use of bed rail, some ataxic like movements of trunk and  extremities  Transfers Overall transfer level: Needs assistance   Transfers: Sit to/from Stand;Stand Pivot Transfers Sit to Stand: Min  assist;Mod assist Stand pivot transfers: Min assist;Mod assist       General transfer comment: assist to rise, patient, reaching for bed rail and BSC armrests. Stand and pivot Transfer  from bed to [facility] then to recliner. patient  remainied on RA with SPO2 .92%.  Ambulation/Gait                Stairs            Wheelchair Mobility    Modified Rankin (Stroke Patients Only)       Balance Overall balance assessment: History of Falls;Needs assistance Sitting-balance support: Feet supported;Single extremity supported Sitting balance-Leahy Scale: Fair     Standing balance support: Bilateral upper extremity supported Standing balance-Leahy Scale: Poor Standing balance comment: reliant on UE support                             Pertinent Vitals/Pain Pain Assessment: Faces Faces Pain Scale: Hurts little more Pain Location: both legs Pain Descriptors / Indicators: Discomfort Pain Intervention(s): Monitored during session    Home Living Family/patient expects to be discharged to:: Assisted living               Home Equipment: Wheelchair - manual      Prior Function Level of Independence: Independent with assistive device(s)         Comments: reports mod I for transfers, bathing dressing, uses WC, non ambulatory     Hand Dominance        Extremity/Trunk Assessment   Upper Extremity Assessment Upper Extremity Assessment: Overall [facility]  for tasks assessed    Lower Extremity Assessment Lower Extremity Assessment: LLE deficits/detail;Generalized weakness LLE Deficits / Details: leg is internally rotated    Cervical / Trunk Assessment Cervical / Trunk Assessment: Kyphotic  Communication   Communication: No difficulties;HOH  Cognition Arousal/Alertness: Awake/alert Behavior During Therapy: WFL for tasks assessed/performed Overall Cognitive Status: Within Functional Limits for tasks assessed                                         General Comments      Exercises     Assessment/Plan    PT Assessment Patient needs continued PT services  PT Problem List Decreased strength;Decreased activity tolerance;Decreased safety awareness;Decreased knowledge of use of DME;Decreased mobility       PT Treatment Interventions Functional mobility training;Therapeutic activities;Therapeutic exercise;Wheelchair mobility training;Patient/family education    PT Goals (Current goals can be found in the Care Plan section)  Acute Rehab PT Goals Patient Stated Goal: to go back to ALF PT Goal Formulation: With patient Time For Goal Achievement: 07/29/19 Potential to Achieve Goals: Good    Frequency Min 2X/week   Barriers to discharge        Co-evaluation               AM-PAC PT "6 Clicks" Mobility  Outcome Measure Help needed turning from your back to your side while in a flat bed without using bedrails?: A Little Help needed moving from lying on your back to sitting on the side of a flat bed without using bedrails?: A Little Help needed moving to and from a bed to a chair (including a wheelchair)?: A Lot Help needed standing up from a chair using your arms (e.g., wheelchair or bedside chair)?: A Lot Help needed to walk in hospital room?: Total Help needed climbing 3-5 steps with a railing? : Total 6 Click Score: 12    End of Session Equipment Utilized During Treatment: Gait belt Activity Tolerance: Patient tolerated treatment well Patient left: in chair;with call bell/phone within reach;with chair alarm set;with nursing/sitter in room Nurse Communication: Mobility status PT Visit Diagnosis: Muscle weakness (generalized) (M62.81);Repeated falls (R29.6)    Time: 4696-2952 PT Time Calculation (min) (ACUTE ONLY): 35 min   Charges:   PT Evaluation $PT Eval Low Complexity: 1 Low PT Treatments $Therapeutic Activity: 8-22 mins        Tresa Endo PT Acute Rehabilitation Services Pager  [phone] Office [phone]   Claretha Cooper 07/15/2019, 2:25 PM

## 2019-07-15 NOTE — TOC Progression Note (Signed)
Transition of Care [facility]) - Progression Note    Patient Details  Name: Paula Bird MRN: [id number] Date of Birth: 08-22-1925  [facility] CM/SW Contact  Geni Bers, RN Phone Number: 07/15/2019, 7:31 AM  Clinical Narrative:     Pt is from [facility] ALF. TOC will continue to follow for discharge needs.   Expected Discharge Plan: Assisted Living Barriers to Discharge: No Barriers Identified  Expected Discharge Plan and Services Expected Discharge Plan: Assisted Living       Living arrangements for the past 2 months: Assisted Living Facility                                       Social Determinants of Health (SDOH) Interventions    Readmission Risk Interventions No flowsheet data found.

## 2019-07-16 LAB — COMPREHENSIVE METABOLIC PANEL
ALT: 87 U/L — ABNORMAL HIGH (ref 0–44)
AST: 53 U/L — ABNORMAL HIGH (ref 15–41)
Albumin: 3.4 g/dL — ABNORMAL LOW (ref 3.5–5.0)
Alkaline Phosphatase: 73 U/L (ref 38–126)
Anion gap: 11 (ref 5–15)
BUN: 18 mg/dL (ref 8–23)
CO2: 24 mmol/L (ref 22–32)
Calcium: 8.8 mg/dL — ABNORMAL LOW (ref 8.9–10.3)
Chloride: 104 mmol/L (ref 98–111)
Creatinine, Ser: 0.62 mg/dL (ref 0.44–1.00)
GFR calc Af Amer: >60 mL/min (ref >60)
GFR calc non Af Amer: >60 mL/min (ref >60)
Glucose, Bld: 141 mg/dL — ABNORMAL HIGH (ref 70–99)
Potassium: 3.8 mmol/L (ref 3.5–5.1)
Sodium: 139 mmol/L (ref 135–145)
Total Bilirubin: 0.5 mg/dL (ref 0.3–1.2)
Total Protein: 6.7 g/dL (ref 6.5–8.1)

## 2019-07-16 LAB — CBC
HCT: 48.1 % — ABNORMAL HIGH (ref 36.0–46.0)
Hemoglobin: 15.1 g/dL — ABNORMAL HIGH (ref 12.0–15.0)
MCH: 28.1 pg (ref 26.0–34.0)
MCHC: 31.4 g/dL (ref 30.0–36.0)
MCV: 89.6 fL (ref 80.0–100.0)
Platelets: 128 10*3/uL — ABNORMAL LOW (ref 150–400)
RBC: 5.37 MIL/uL — ABNORMAL HIGH (ref 3.87–5.11)
RDW: 15 % (ref 11.5–15.5)
WBC: 7.5 10*3/uL (ref 4.0–10.5)
nRBC: 0 % (ref 0.0–0.2)

## 2019-07-16 LAB — GLUCOSE, CAPILLARY
Glucose-Capillary: 130 mg/dL — ABNORMAL HIGH (ref 70–99)
Glucose-Capillary: 134 mg/dL — ABNORMAL HIGH (ref 70–99)
Glucose-Capillary: 96 mg/dL (ref 70–99)
Glucose-Capillary: 138 mg/dL — ABNORMAL HIGH (ref 70–99)

## 2019-07-16 MED ORDER — AMITRIPTYLINE HCL 100 MG PO TABS
100.0000 mg | ORAL_TABLET | Freq: Every day | ORAL | Status: DC
  Administered 2019-07-16 – 2019-07-17 (×2): 100 mg via ORAL
  Filled 2019-07-16 (×3): qty 1

## 2019-07-16 MED ORDER — ZOLPIDEM TARTRATE 5 MG PO TABS: 5.0000 mg | ORAL_TABLET | Freq: Every evening | ORAL | Status: DC | PRN

## 2019-07-16 MED ORDER — HYDROCODONE-ACETAMINOPHEN 5-325 MG PO TABS: 1.0000 | ORAL_TABLET | ORAL | Status: DC | PRN

## 2019-07-16 MED ORDER — ZOLPIDEM TARTRATE 5 MG PO TABS
5.0000 mg | ORAL_TABLET | Freq: Every day | ORAL | Status: DC
  Administered 2019-07-16 – 2019-07-17 (×2): 5 mg via ORAL
  Filled 2019-07-16 (×2): qty 1

## 2019-07-16 MED ORDER — ADULT MULTIVITAMIN W/MINERALS CH
1.0000 | ORAL_TABLET | Freq: Every day | ORAL | Status: DC
  Administered 2019-07-16 – 2019-07-18 (×3): 1 via ORAL
  Filled 2019-07-16 (×3): qty 1

## 2019-07-16 MED ORDER — ENSURE ENLIVE PO LIQD
237.0000 mL | Freq: Two times a day (BID) | ORAL | Status: DC
  Administered 2019-07-16 – 2019-07-18 (×3): 237 mL via ORAL

## 2019-07-16 NOTE — Progress Notes (Signed)
Initial Nutrition Assessment  RD working remotely.   DOCUMENTATION CODES:   Not applicable  INTERVENTION:  - will order Ensure Enlive BID, each supplement provides 350 kcal and 20 grams of protein. - will order Magic Cup with dinner meals, each supplement provides 290 kcal and 9 grams of protein. - will order daily multivitamin with minerals.   NUTRITION DIAGNOSIS:   Increased nutrient needs related to acute illness, catabolic illness(COVID-19 infection) as evidenced by estimated needs.  GOAL:   Patient will meet greater than or equal to 90% of their needs  MONITOR:   PO intake, Supplement acceptance, Labs, Weight trends  REASON FOR ASSESSMENT:   Consult Assessment of nutrition requirement/status  ASSESSMENT:   86 y.o. female with medical history of COPD not on home O2, HTN, A. fib, CKD, hypothyroidism, iron deficiency anemia, and depression. She presented from ALF with worsening cough, SOB, and body aches. In the ED, COVID-19 test was positive and CXR showed slight opacities bilaterally.  Per chart review, she ate 25% of breakfast and lunch yesterday and 25% of breakfast this AM. Weight yesterday was 148 lb and this is the highest weight in at least the past 18 months.   Per notes: - acute hypoxic respiratory failure d/t COVID-19 PNA - IBS and GERD - from ALF and anticipated d/c plan is for back to ALF   Labs reviewed; CBG: 130 mg/dl, LFTs elevated. Medications reviewed; 500 mg ascorbic acid/day, sliding scale novolog, 40 mg IV protonix BID, 40 mEq Klor-Con x1 dose 4/21, 100 mg IV remdesivir x1 dose/day (4/21-4/24), 1 g carafate QID, 220 mg zinc sulfate/day.    NUTRITION - FOCUSED PHYSICAL EXAM:  unable to complete at this time.   Diet Order:   Diet Order            Diet Heart Room service appropriate? Yes; Fluid consistency: Thin  Diet effective now              EDUCATION NEEDS:   No education needs have been identified at this time  Skin:  Skin  Assessment: Reviewed RN Assessment  Last BM:  4/21  Height:   Ht Readings from Last 1 Encounters:  07/15/19 5' (1.524 m)    Weight:   Wt Readings from Last 1 Encounters:  07/15/19 67 kg    Ideal Body Weight:  45.4 kg  BMI:  Body mass index is 28.85 kg/m.  Estimated Nutritional Needs:   Kcal:  9528-4132 kcal  Protein:  70-80 grams  Fluid:  >/= 1.7 L/day     Trenton Gammon, MS, RD, LDN, CNSC Inpatient Clinical Dietitian RD pager # available in AMION  After hours/weekend pager # available in [facility]

## 2019-07-16 NOTE — Progress Notes (Signed)
Pt expressed feeling sad/depressed/anxious.  States the meds she took last night didn't help, she was given vistaril and her amitriptyline.  Pt then went on to say that back in Feb she lost her long time companion of 20+ years. Pt endorses long time issues with sleep and did not sleep at all last night.  RN is concerned with pt developing delirium if she is not able to rest at night.  Pt did well 4/20 with Remus Loffler

## 2019-07-16 NOTE — Progress Notes (Addendum)
PROGRESS NOTE    Paula Bird  CEY:[id number] DOB: 08-11-1925 DOA: 07/14/2019 PCP: Paula Cox, MD      Brief Narrative:  Paula Bird is a 86 y.o. F with hx COPD not on home oxygen, HTN, A. fib, CKD, hypothyroidism, iron deficiency anemia, and depression who presented from ALF with worsening cough, shortness of breath, body aches.  In the ER Covid positive, chest x-ray showed slight opacities bilaterally.  Overnight requiring oxygen.      Assessment & Plan:  Acute hypoxic respiratory failure due to Covid pneumonitis Afebrile, remains on 1 L.   -Continue remdesivir, day 3 of 5 -Continue steroids, day 3  COPD -Continue albuterol scheduled -Continue pantoprazole -Continue Incruse and Dulera  Diabetes Glucoses normal -Continue sliding scale corrections  Hypertension BP normal -Continue diltiazem  A. Fib, chronic Not on anticoagulant, rate slightly high -Continue amiodarone, diltiazem  CKD ruled out Cr normal, stable relative to baseline  Anemia -Hold iron  Thyroid disease -Continue Tapazole daily -Continue CellCept  Depression, anxiety -Continue Elavil -Continue Ambien at night -Continue Atarax for anxiety  IBS/GERD -Continue Protonix, Linzess, sucralfate  Hypokalemia Repleted        Disposition: Status is: Inpatient  Remains inpatient appropriate because:Ongoing oxygen requirement   Dispo: The patient is from: ALF              Anticipated d/c is to: ALF              Anticipated d/c date is: 2 days              Patient currently is not medically stable to d/c.               MDM: The below labs and imaging reports reviewed and summarized above.  Medication management as above.   DVT prophylaxis: Lovenox Code Status: DO NOT RESUSCITATE Family Communication: Will call son by phone    Consultants:   Procedures:     Antimicrobials:      Culture data:              Subjective: The patient has  no cough, respiratory distress.  She is overall weak, tired, fatigued.  She did not sleep last night.  No confusion, respiratory distress, fever.       Objective: Vitals:   07/15/19 2002 07/16/19 0542 07/16/19 0600 07/16/19 1331  BP:  (!) 157/109  (!) 134/96  Pulse:  (!) 111 (!) 107 91  Resp:  17  18  Temp:  97.6 F (36.4 C)  97.9 F (36.6 C)  TempSrc:  Oral  Oral  SpO2: 95% 93%  96%  Weight:      Height:        Intake/Output Summary (Last 24 hours) at 07/16/2019 1431 Last data filed at 07/16/2019 4975 Gross per 24 hour  Intake 460 ml  Output --  Net 460 ml   Filed Weights   07/15/19 0500  Weight: 67 kg    Examination: General appearance: Elderly, obese female, sitting in bed, no acute distress, eating lunch. HEENT: Anicteric, conjunctival pink, lids and lashes normal.  No nasal deformity, discharge, or epistaxis.  Lips moist, interested in place, oropharynx moist, no oral lesions, hearing normal. Skin: Skin warm and dry, no suspicious rashes or lesions Cardiac: Regular rate and rhythm, no murmurs, JVP normal, no lower extremity edema Respiratory: Normal respiratory rate and rhythm, lungs clear without rales or wheezes, diminished bilaterally Abdomen: Soft without tenderness palpation or guarding MSK: No deformities or effusions.  Neuro: Awake and alert, extraocular movements intact, moves all extremities with normal strength and coordination, speech fluent    Psych: Sensorium intact responding to questions, attention normal, affect anxious, judgment insight appear normal    Data Reviewed: I have personally reviewed following labs and imaging studies:  CBC: Recent Labs  Lab 07/14/19 1357 07/15/19 0415 07/16/19 0126  WBC 6.3 3.2* 7.5  NEUTROABS 4.9 2.8  --   HGB 15.1* 14.8 15.1*  HCT 48.4* 48.4* 48.1*  MCV 90.6 91.7 89.6  PLT 124* 132* 390*   Basic Metabolic Panel: Recent Labs  Lab 07/14/19 1357 07/15/19 0415 07/16/19 0126  NA 141 141 139  K 2.9* 3.0*  3.8  CL 102 108 104  CO2 26 25 24   GLUCOSE 105* 155* 141*  BUN 9 13 18   CREATININE 0.48 0.45 0.62  CALCIUM 8.7* 8.6* 8.8*   GFR: Estimated Creatinine Clearance: 39.1 mL/min (by C-G formula based on SCr of 0.62 mg/dL). Liver Function Tests: Recent Labs  Lab 07/14/19 1357 07/15/19 0415 07/16/19 0126  AST 18 48* 53*  ALT 19 59* 87*  ALKPHOS 82 76 73  BILITOT 0.6 0.5 0.5  PROT 7.2 6.8 6.7  ALBUMIN 3.8 3.4* 3.4*   No results for input(s): LIPASE, AMYLASE in the last 168 hours. No results for input(s): AMMONIA in the last 168 hours. Coagulation Profile: No results for input(s): INR, PROTIME in the last 168 hours. Cardiac Enzymes: No results for input(s): CKTOTAL, CKMB, CKMBINDEX, TROPONINI in the last 168 hours. BNP (last 3 results) No results for input(s): PROBNP in the last 8760 hours. HbA1C: No results for input(s): HGBA1C in the last 72 hours. CBG: Recent Labs  Lab 07/15/19 1143 07/15/19 1653 07/15/19 2156 07/16/19 0748 07/16/19 1140  GLUCAP 204* 99 165* 130* 134*   Lipid Profile: Recent Labs    07/14/19 1357  TRIG 61   Thyroid Function Tests: Recent Labs    07/14/19 1357  TSH 17.448*  T4TOTAL 3.2*   Anemia Panel: Recent Labs    07/14/19 1357 07/15/19 0415  FERRITIN 94 110   Urine analysis:    Component Value Date/Time   COLORURINE YELLOW 08/25/2018 [place] 08/25/2018 1157   LABSPEC 1.011 08/25/2018 1157   PHURINE 7.0 08/25/2018 1157   GLUCOSEU NEGATIVE 08/25/2018 1157   HGBUR NEGATIVE 08/25/2018 1157   BILIRUBINUR NEGATIVE 08/25/2018 1157   KETONESUR NEGATIVE 08/25/2018 1157   PROTEINUR NEGATIVE 08/25/2018 1157   UROBILINOGEN 0.2 08/13/2014 2256   NITRITE NEGATIVE 08/25/2018 1157   LEUKOCYTESUR TRACE (A) 08/25/2018 1157   Sepsis Labs: @LABRCNTIP (procalcitonin:4,lacticacidven:4)  ) Recent Results (from the past 240 hour(s))  Blood Culture (routine x 2)     Status: None (Preliminary result)   Collection Time: 07/14/19   1:09 PM   Specimen: BLOOD RIGHT HAND  Result Value Ref Range Status   Specimen Description   Final    BLOOD RIGHT HAND Performed at [facility], [address] [place], [place] [address]    Special Requests   Final    BOTTLES DRAWN AEROBIC AND ANAEROBIC Blood Culture adequate volume Performed at [facility] [address], [place], [place] [address]    Culture   Final    NO GROWTH 2 DAYS Performed at [facility] [address], [place], [place] [address]    Report Status PENDING  Incomplete  Blood Culture (routine x 2)     Status: None (Preliminary result)   Collection Time: 07/14/19  3:59 PM   Specimen: BLOOD  Result Value Ref Range Status   Specimen Description   Final    BLOOD RIGHT ANTECUBITAL Performed at [facility], [address], [place], [place] [address]    Special Requests   Final    BOTTLES DRAWN AEROBIC AND ANAEROBIC Blood Culture results may not be optimal due to an inadequate volume of blood received in culture bottles Performed at [facility], [address], [place], [place] [address]    Culture   Final    NO GROWTH 2 DAYS Performed at [facility], [address], [place], [place] [address]    Report Status PENDING  Incomplete         Radiology Studies: DG Chest Port 1 View  Result Date: 07/14/2019 CLINICAL DATA:  Cough. EXAM: PORTABLE CHEST 1 VIEW COMPARISON:  August 25, 2018. FINDINGS: Stable cardiomegaly. No pneumothorax or pleural effusion is noted. Minimal bibasilar subsegmental atelectasis is noted. Bony thorax is unremarkable. IMPRESSION: Minimal bibasilar subsegmental atelectasis. Electronically Signed   By: Lupita Raider M.D.   On: 07/14/2019 15:03        Scheduled Meds: . albuterol  2 puff Inhalation Q6H  . amiodarone  200 mg Oral Daily  . amitriptyline  100 mg Oral QHS  . vitamin C  500 mg Oral Daily  . dexamethasone (DECADRON) injection   6 mg Intravenous Q24H  . diltiazem  300 mg Oral Daily  . enoxaparin (LOVENOX) injection  40 mg Subcutaneous Q24H  . feeding supplement (ENSURE ENLIVE)  237 mL Oral BID BM  . fluticasone furoate-vilanterol  1 puff Inhalation Daily  . insulin aspart  0-5 Units Subcutaneous QHS  . insulin aspart  0-9 Units Subcutaneous TID WC  . linaclotide  145 mcg Oral QAC breakfast  . methimazole  10 mg Oral Daily  . multivitamin with minerals  1 tablet Oral Daily  . mycophenolate  500 mg Oral BID  . pantoprazole (PROTONIX) IV  40 mg Intravenous Q12H  . sucralfate  1 g Oral QID  . umeclidinium bromide  1 puff Inhalation Daily  . zinc sulfate  220 mg Oral Daily  . zolpidem  5 mg Oral QHS   Continuous Infusions: . remdesivir 100 mg in NS 100 mL 100 mg (07/16/19 0907)     LOS: 2 days    Time spent: 25 minutes    Alberteen Sam, MD [facility] 07/16/2019, 2:31 PM     Please page though AMION or Epic secure chat:  For [employer], Higher education careers adviser

## 2019-07-16 NOTE — Plan of Care (Signed)
  Problem: Education: Goal: Knowledge of risk factors and measures for prevention of condition will improve Outcome: Progressing   Problem: Coping: Goal: Psychosocial and spiritual needs will be supported Outcome: Progressing   Problem: Respiratory: Goal: Will maintain a patent airway Outcome: Progressing Goal: Complications related to the disease process, condition or treatment will be avoided or minimized Outcome: Progressing   

## 2019-07-16 NOTE — Progress Notes (Addendum)
  Pt expressed to night RN that she had trouble ordering her food this morning because the line stayed busy, therefore did not get to order what she wanted. She expressed that she was not happy with the house trays sent to her for breakfast lunch or dinner, stating she cannot eat anything spicy, fried/greasy or anything with tomatoes.  She was specifically upset that she was send tomato soup.  Night RN asked if she expressed to the day RN the need for assistance with ordering and tray set up. RN told her that she cannot fix what happened during days with meals sent, and that those things need to be addressed in the moment with the RN on duty so it can rectified in a timely manner.  Night RN asked patient to tell her what she wants to order for breakfast, lunch and dinner tomorrow so night RN can ensure the meals  are ordered before leaving in the morning.    Pt requested pancakes, oatmeal, coffee and strawberry yogurt for breakfast, cottage cheese fruit plate and chicken soup for lungh and pot roast with vegetables for dinner.  Specifically asked if she wanted desert and she said no  RN will take the liberty of ordering her additional yogurts so she can use the yogurt to take her medications instead of ice cream   Called in pt order, she already had an order in for breakfast, I did not change it only adding yogurt.  Ordered lunch and dinner per above note

## 2019-07-16 NOTE — Progress Notes (Signed)
Pt rang out when asked what she needed she said she needs to sleep.  I told her there was nothing I could give her at this hour of the morning and that the MD would likely not order her Ambien or anything of the like at this hour of the morning.  She said its not some much that she wants the Ambien is that she is convinced that she did not receive her full dose of amitriptyline, she says she takes 2 tablets.  I assured her that she got her full dose, a 100mg  tablet with her 10 pm meds and explained to her that it is likely the steroid that is making her not sleep.

## 2019-07-16 NOTE — NC FL2 (Signed)
[place] LEVEL OF CARE SCREENING TOOL     IDENTIFICATION  Patient Name: Paula Bird Birthdate: [birth year] Sex: female Admission Date (Current Location): 07/14/2019  [facility] and [place] Number:  Herbalist and Address:  [facility],  [place] [place], [place]      Provider Number: [id number]  Attending Physician Name and Address:  Edwin Dada, *  Relative Name and Phone Number:  Lateisha, Thurlow [phone]  or Ricks,Jane Daughter [phone]  [phone] or Ricks,Wayne Other   [phone]    Current Level of Care: Hospital Recommended Level of Care: [place] Prior Approval Number:    Date Approved/Denied:   PASRR Number: [id number]  Discharge Plan: SNF    Current Diagnoses: Patient Active Problem List   Diagnosis Date Noted  . COVID-19 virus infection 07/14/2019  . Closed fracture of left ankle   . Closed fracture of right ankle   . Fibula fracture 10/19/2017  . Avulsion fracture of medial malleolus of left tibia 10/19/2017  . UTI (urinary tract infection) 07/17/2017  . Infection due to parainfluenza virus 4 03/06/2017  . Gait abnormality 06/07/2016  . Hyperthyroidism 04/24/2016  . Myasthenia gravis ([place]) 02/07/2016  . Ptosis of eyelid, right 01/31/2016  . Pain, eye, right 01/31/2016  . Falling 01/31/2016  . Vertigo 10/08/2015  . Colon polyp 08/30/2015  . Arthritis, degenerative 08/30/2015  . Acid indigestion 08/30/2015  . Psoriasis 08/30/2015  . Avitaminosis D 08/30/2015  . HCAP (healthcare-associated pneumonia) 08/08/2015  . Respiratory distress 08/08/2015  . Essential hypertension 08/08/2015  . COPD exacerbation ([place]) 08/08/2015  . Dyspnea 08/04/2015  . Hypertension 08/04/2015  . Depression 08/04/2015  . Insomnia 08/04/2015  . Obstructive chronic bronchitis with acute exacerbation ([place]) 08/04/2015  . Thrombocytopenia ([place]) 08/04/2015  . Paroxysmal atrial fibrillation  ([place]) 08/04/2015  . Acute exacerbation of chronic obstructive bronchitis ([place]) 08/04/2015  . Infection of urinary tract 07/01/2014  . Bradycardia 11/24/2013  . Fall in home 11/22/2013  . Pulmonary hypertension ([place]) 11/19/2013  . Excessive falling 09/07/2012  . Encounter for general adult medical examination without abnormal findings 03/24/2012  . Atrial paroxysmal tachycardia ([place]) 10/05/2011  . Family history of colon cancer 05/15/2011  . Mass of pelvis 05/15/2011    Orientation RESPIRATION BLADDER Height & Weight     Self, Time, Situation, Place  O2(2L) Continent Weight: 147 lb 11.3 oz (67 kg) Height:  5' (152.4 cm)  BEHAVIORAL SYMPTOMS/MOOD NEUROLOGICAL BOWEL NUTRITION STATUS      Continent Diet(Cardiac diet)  AMBULATORY STATUS COMMUNICATION OF NEEDS Skin   Limited Assist Verbally Normal                       Personal Care Assistance Level of Assistance  Bathing, Feeding, Dressing Bathing Assistance: Limited assistance Feeding assistance: Independent Dressing Assistance: Limited assistance     Functional Limitations Info  Sight, Speech, Hearing Sight Info: Adequate Hearing Info: Adequate Speech Info: Adequate    SPECIAL CARE FACTORS FREQUENCY  PT (By licensed PT), OT (By licensed OT)     PT Frequency: Minimum 5x a week OT Frequency: Minimum 5x a week            Contractures Contractures Info: Not present    Additional Factors Info  Code Status, Allergies, Insulin Sliding Scale, Isolation Precautions Code Status Info: DNR Allergies Info: NKA   Insulin Sliding Scale Info: insulin aspart (novoLOG) injection 0-5 Units min 3x a day with meals Isolation Precautions Info: Covid+  on Air/Con precautions     Current Medications (07/16/2019):  This is the current hospital active medication list Current Facility-Administered Medications  Medication Dose Route Frequency Provider Last Rate Last Admin  . acetaminophen (TYLENOL) tablet 650 mg  650 mg Oral Q6H PRN  Lorin Glass, MD   650 mg at 07/14/19 2217  . albuterol (VENTOLIN HFA) 108 (90 Base) MCG/ACT inhaler 2 puff  2 puff Inhalation Q6H Dahal, Melina Schools, MD   2 puff at 07/16/19 0913  . amiodarone (PACERONE) tablet 200 mg  200 mg Oral Daily Alberteen Sam, MD   200 mg at 07/16/19 0916  . amitriptyline (ELAVIL) tablet 100 mg  100 mg Oral QHS Danford, Earl Lites, MD      . ascorbic acid (VITAMIN C) tablet 500 mg  500 mg Oral Daily Dahal, Binaya, MD   500 mg at 07/16/19 0916  . chlorpheniramine-HYDROcodone (TUSSIONEX) 10-8 MG/5ML suspension 5 mL  5 mL Oral Q12H PRN Lorin Glass, MD   5 mL at 07/16/19 0117  . dexamethasone (DECADRON) injection 6 mg  6 mg Intravenous Q24H Dahal, Melina Schools, MD   6 mg at 07/15/19 2345  . diltiazem (CARDIZEM CD) 24 hr capsule 300 mg  300 mg Oral Daily Danford, Earl Lites, MD   300 mg at 07/16/19 0915  . enoxaparin (LOVENOX) injection 40 mg  40 mg Subcutaneous Q24H Dahal, Melina Schools, MD   40 mg at 07/15/19 2138  . feeding supplement (ENSURE ENLIVE) (ENSURE ENLIVE) liquid 237 mL  237 mL Oral BID BM Danford, Earl Lites, MD   237 mL at 07/16/19 1258  . fluticasone furoate-vilanterol (BREO ELLIPTA) 100-25 MCG/INH 1 puff  1 puff Inhalation Daily Danford, Earl Lites, MD   1 puff at 07/16/19 0917  . guaiFENesin-dextromethorphan (ROBITUSSIN DM) 100-10 MG/5ML syrup 10 mL  10 mL Oral Q4H PRN Dahal, Binaya, MD      . HYDROcodone-acetaminophen (NORCO/VICODIN) 5-325 MG per tablet 1 tablet  1 tablet Oral Q4H PRN Danford, Earl Lites, MD      . hydrOXYzine (ATARAX/VISTARIL) tablet 25 mg  25 mg Oral Q8H PRN Alberteen Sam, MD   25 mg at 07/16/19 0915  . insulin aspart (novoLOG) injection 0-5 Units  0-5 Units Subcutaneous QHS Dahal, Binaya, MD      . insulin aspart (novoLOG) injection 0-9 Units  0-9 Units Subcutaneous TID WC Dahal, Melina Schools, MD   1 Units at 07/16/19 1258  . linaclotide (LINZESS) capsule 145 mcg  145 mcg Oral QAC breakfast Alberteen Sam, MD   145 mcg at  07/16/19 0916  . methimazole (TAPAZOLE) tablet 10 mg  10 mg Oral Daily Danford, Earl Lites, MD   10 mg at 07/16/19 0915  . multivitamin with minerals tablet 1 tablet  1 tablet Oral Daily Danford, Earl Lites, MD   1 tablet at 07/16/19 1257  . mycophenolate (CELLCEPT) capsule 500 mg  500 mg Oral BID Alberteen Sam, MD   500 mg at 07/16/19 0916  . ondansetron (ZOFRAN) tablet 4 mg  4 mg Oral Q6H PRN Dahal, Binaya, MD       Or  . ondansetron (ZOFRAN) injection 4 mg  4 mg Intravenous Q6H PRN Dahal, Binaya, MD      . pantoprazole (PROTONIX) injection 40 mg  40 mg Intravenous Q12H Dahal, Melina Schools, MD   40 mg at 07/16/19 0917  . remdesivir 100 mg in sodium chloride 0.9 % 100 mL IVPB  100 mg Intravenous Daily Danford Bad, RPH 200 mL/hr at 07/16/19  0907 100 mg at 07/16/19 0907  . sucralfate (CARAFATE) tablet 1 g  1 g Oral QID Alberteen Sam, MD   1 g at 07/16/19 0916  . umeclidinium bromide (INCRUSE ELLIPTA) 62.5 MCG/INH 1 puff  1 puff Inhalation Daily Danford, Earl Lites, MD   1 puff at 07/16/19 0917  . zinc sulfate capsule 220 mg  220 mg Oral Daily Dahal, Binaya, MD   220 mg at 07/16/19 0916  . zolpidem (AMBIEN) tablet 5 mg  5 mg Oral QHS PRN Danford, Earl Lites, MD      . zolpidem (AMBIEN) tablet 5 mg  5 mg Oral QHS Danford, Earl Lites, MD         Discharge Medications: Please see discharge summary for a list of discharge medications.  Relevant Imaging Results:  Relevant Lab Results:   Additional Information Covid + SSN [id number]  Darleene Cleaver, LCSW

## 2019-07-17 DIAGNOSIS — G20 Parkinson's disease: Secondary | ICD-10-CM

## 2019-07-17 DIAGNOSIS — I482 Chronic atrial fibrillation, unspecified: Secondary | ICD-10-CM

## 2019-07-17 DIAGNOSIS — F0281 Dementia in other diseases classified elsewhere with behavioral disturbance: Secondary | ICD-10-CM

## 2019-07-17 DIAGNOSIS — E0590 Thyrotoxicosis, unspecified without thyrotoxic crisis or storm: Secondary | ICD-10-CM

## 2019-07-17 LAB — GLUCOSE, CAPILLARY
Glucose-Capillary: 143 mg/dL — ABNORMAL HIGH (ref 70–99)
Glucose-Capillary: 203 mg/dL — ABNORMAL HIGH (ref 70–99)
Glucose-Capillary: 196 mg/dL — ABNORMAL HIGH (ref 70–99)
Glucose-Capillary: 114 mg/dL — ABNORMAL HIGH (ref 70–99)

## 2019-07-17 MED ORDER — PANTOPRAZOLE SODIUM 40 MG PO TBEC
40.0000 mg | DELAYED_RELEASE_TABLET | Freq: Two times a day (BID) | ORAL | Status: DC
  Administered 2019-07-17 – 2019-07-18 (×3): 40 mg via ORAL
  Filled 2019-07-17 (×3): qty 1

## 2019-07-17 NOTE — Care Management Important Message (Signed)
Important Message  Patient Details  IM Letter given to RN to present to the Patient Name: Callia Swim MRN: [id number] Date of Birth: May 18, 1925   Medicare Important Message Given:  Yes     Caren Macadam 07/17/2019, 10:32 AM

## 2019-07-17 NOTE — Progress Notes (Signed)
Physical Therapy Treatment Patient Details Name: Paula Bird MRN: [id number] DOB: [birth year] Today's Date: 07/17/2019    History of Present Illness Paula Bird is a 86 y.o. female with PMH significant for HTN, A. fib,myasthenia gravis, vision loss, CKD, COPD not on home oxygen, GERD, dysphagia, hypothyroidism, iron deficiency anemia, irritable bowel syndrome, depression and impaired mobility who has been living at [facility] assisted living for last 5 years, currently WC dependent. Patient has history of esophageal dysmotility and dysphagia leading to recurrent pneumonia.  Patient received both doses of Covid vaccines  .She has progressively worsening shortness of breath, worse with exertion.  Also has generalized body aches. Positive Covid.    PT Comments    Pt on phone with daughter upon arrival but agreeable to therapy. Pt tolerates seated therapeutic exercises with cues for controlled movement. Pt able to perform STS and stand pivot transfers with HHA initially due to nervousness about falling, progressing to using RW with good safety awareness and hand placement. Pt performs steps fwd/back in front of chair of 3 ft x4 reps with RW, shakiness initially that improves with time and narrow BOS, min assist to steady. Pt on RA for entire treatment session with SpO2 95-97%, mild increased work of breathing and reports of feeling "tired". Pt returned to supine in bed, c/o being cold so given additional blankets and call bell in case needs arise. Patient will benefit from continued physical therapy in hospital and recommended venue below to increase strength, balance, endurance for safe ADLs and gait.   Follow Up Recommendations  Home health PT(return to AL:F)     Equipment Recommendations  None recommended by PT    Recommendations for Other Services       Precautions / Restrictions Precautions Precautions: Fall Restrictions Weight Bearing Restrictions: No    Mobility  Bed Mobility    Bed Mobility: Supine to Sit;Sit to Supine     Supine to sit: HOB elevated;Supervision Sit to supine: Supervision   General bed mobility comments: increased time, cues for hand placement on bedrails to assist in upright trunk  Transfers Overall transfer level: Needs assistance Equipment used: Rolling walker (2 wheeled);1 person hand held assist Transfers: Sit to/from [employer] Sit to Stand: Min assist Stand pivot transfers: Min assist       General transfer comment: min assist to steady pt, bracing back of legs on front of bed and chair to rise, increased shakiness upon rising but improves with time; performed initially with HHA then with RW demonstrating fair balance with both  Ambulation/Gait Ambulation/Gait assistance: Min assist Gait Distance (Feet): 3 Feet(x4 reps) Assistive device: Rolling walker (2 wheeled) Gait Pattern/deviations: Step-to pattern;Decreased stride length;Narrow base of support Gait velocity: decreased   General Gait Details: limited to 3' fwd/back in room in front of chair, increased shakiness with initial amb but improved with time; pt too nervous to ambualte further distance at this time   Stairs             Wheelchair Mobility    Modified Rankin (Stroke Patients Only)       Balance Overall balance assessment: History of Falls;Needs assistance Sitting-balance support: Feet supported;Single extremity supported Sitting balance-Leahy Scale: Good Sitting balance - Comments: seated EOB   Standing balance support: Bilateral upper extremity supported;During functional activity Standing balance-Leahy Scale: Poor Standing balance comment: reliant on HHA or RW                 Cognition Arousal/Alertness: Awake/alert Behavior During Therapy: [facility] for  tasks assessed/performed Overall Cognitive Status: Within Functional Limits for tasks assessed                     Exercises General Exercises - Lower  Extremity Long Arc Quad: Seated;Both;10 reps Hip Flexion/Marching: Seated;Both;10 reps    General Comments General comments (skin integrity, edema, etc.): Pt on RA with SpO2 95-97% with mobility, mild increased work of breathing, reporting "I just feel tired" but denies dizziness, HA, pain, or lightheadedness      Pertinent Vitals/Pain Pain Assessment: No/denies pain    Home Living                      Prior Function            PT Goals (current goals can now be found in the care plan section) Acute Rehab PT Goals Patient Stated Goal: to go back to ALF PT Goal Formulation: With patient Time For Goal Achievement: 07/29/19 Potential to Achieve Goals: Good Additional Goals Additional Goal #1: propel WC x100' w/ supervision Progress towards PT goals: Progressing toward goals    Frequency    Min 2X/week      PT Plan Current plan remains appropriate    Co-evaluation              AM-PAC PT "6 Clicks" Mobility   Outcome Measure  Help needed turning from your back to your side while in a flat bed without using bedrails?: A Little Help needed moving from lying on your back to sitting on the side of a flat bed without using bedrails?: A Little Help needed moving to and from a bed to a chair (including a wheelchair)?: A Little Help needed standing up from a chair using your arms (e.g., wheelchair or bedside chair)?: A Little Help needed to walk in hospital room?: A Lot Help needed climbing 3-5 steps with a railing? : Total 6 Click Score: 15    End of Session Equipment Utilized During Treatment: Gait belt Activity Tolerance: Patient tolerated treatment well Patient left: in bed;with call bell/phone within reach Nurse Communication: Mobility status PT Visit Diagnosis: Muscle weakness (generalized) (M62.81);Repeated falls (R29.6)     Time: 1497-0263 PT Time Calculation (min) (ACUTE ONLY): 20 min  Charges:  $Therapeutic Activity: 8-22 mins                       Tori Treasure Ingrum PT, DPT 07/17/19, 1:20 PM [phone]

## 2019-07-17 NOTE — TOC Initial Note (Signed)
Transition of Care [facility]) - Initial/Assessment Note    Patient Details  Name: Paula Bird MRN: [id number] Date of Birth: [birth year]  [facility] CM/SW Contact:    Darleene Cleaver, LCSW Phone Number: 07/17/2019, 9:54 AM  Clinical Narrative:                  Patient is a 86 year old female who is alert and oriented x4.  Patient lives at [facility], CSW completed assessment by speaking to patient's son via phone.  CSW was not able to speak to patient, due to being in a covid room, CSW attempted to call however patient did not answer the phone.  CSW discussed options about short term rehab with patient's son, CSW informed him that there are a limited amount of SNFs that are able to accept Covid patients.  Patient's son was given the names of the SNFs that do accept patients and he was going to research and let CSW know what their preference is.  CSW was given permission to begin bed search for facilities accepting Covid patients.   Expected Discharge Plan: Skilled Nursing Facility Barriers to Discharge: Continued Medical Work up   Patient Goals and CMS Choice Patient states their goals for this hospitalization and ongoing recovery are:: Patiet plans to go to SNF for short term rehab, then return back to [facility]. CMS Medicare.gov Compare Post Acute Care list provided to:: Patient Represenative (must comment) Choice offered to / list presented to : Adult Children  Expected Discharge Plan and Services Expected Discharge Plan: Skilled Nursing Facility     Post Acute Care Choice: Skilled Nursing Facility Living arrangements for the past 2 months: Assisted Living Facility                   DME Agency: NA       HH Arranged: NA          Prior Living Arrangements/Services Living arrangements for the past 2 months: Assisted Living Facility Lives with:: Facility Resident Patient language and need for interpreter reviewed:: Yes Do you feel safe  going back to the place where you live?: No   Patient needs some rehab and then finish isolation for 14 days due to Covid.  Need for Family Participation in Patient Care: Yes (Comment) Care giver support system in place?: Yes (comment)   Criminal Activity/Legal Involvement Pertinent to Current Situation/Hospitalization: No - Comment as needed  Activities of Daily Living Home Assistive Devices/Equipment: Wheelchair ADL Screening (condition at time of admission) Patient's cognitive ability adequate to safely complete daily activities?: Yes Is the patient deaf or have difficulty hearing?: Yes Does the patient have difficulty seeing, even when wearing glasses/contacts?: No Does the patient have difficulty concentrating, remembering, or making decisions?: No Patient able to express need for assistance with ADLs?: Yes Does the patient have difficulty dressing or bathing?: Yes Independently performs ADLs?: No Communication: Independent Dressing (OT): Needs assistance Is this a change from baseline?: Pre-admission baseline Grooming: Needs assistance Is this a change from baseline?: Pre-admission baseline Feeding: Independent Bathing: Needs assistance Is this a change from baseline?: Pre-admission baseline Toileting: Needs assistance Is this a change from baseline?: Pre-admission baseline In/Out Bed: Needs assistance Is this a change from baseline?: Pre-admission baseline Walks in Home: Needs assistance Is this a change from baseline?: Pre-admission baseline Does the patient have difficulty walking or climbing stairs?: Yes Weakness of Legs: Both Weakness of Arms/Hands: Both  Permission Sought/Granted Permission sought to share information with :  Facility Sport and exercise psychologist, Family Supports Permission granted to share information with : Yes, Release of Information Signed  Share Information with NAME: Paula Bird [phone]  Permission granted to share info w AGENCY: SNF  admissions        Emotional Assessment Appearance:: Appears stated age Attitude/Demeanor/Rapport: Engaged Affect (typically observed): Accepting, Appropriate, Calm Orientation: : Oriented to Self, Oriented to Place, Oriented to  Time, Oriented to Situation Alcohol / Substance Use: Not Applicable Psych Involvement: No (comment)  Admission diagnosis:  COVID-19 virus infection [U07.1] Patient Active Problem List   Diagnosis Date Noted  . COVID-19 virus infection 07/14/2019  . Closed fracture of left ankle   . Closed fracture of right ankle   . Fibula fracture 10/19/2017  . Avulsion fracture of medial malleolus of left tibia 10/19/2017  . UTI (urinary tract infection) 07/17/2017  . Infection due to parainfluenza virus 4 03/06/2017  . Gait abnormality 06/07/2016  . Hyperthyroidism 04/24/2016  . Myasthenia gravis ([place]) 02/07/2016  . Ptosis of eyelid, right 01/31/2016  . Pain, eye, right 01/31/2016  . Falling 01/31/2016  . Vertigo 10/08/2015  . Colon polyp 08/30/2015  . Arthritis, degenerative 08/30/2015  . Acid indigestion 08/30/2015  . Psoriasis 08/30/2015  . Avitaminosis D 08/30/2015  . HCAP (healthcare-associated pneumonia) 08/08/2015  . Respiratory distress 08/08/2015  . Essential hypertension 08/08/2015  . COPD exacerbation ([place]) 08/08/2015  . Dyspnea 08/04/2015  . Hypertension 08/04/2015  . Depression 08/04/2015  . Insomnia 08/04/2015  . Obstructive chronic bronchitis with acute exacerbation ([place]) 08/04/2015  . Thrombocytopenia ([place]) 08/04/2015  . Paroxysmal atrial fibrillation ([place]) 08/04/2015  . Acute exacerbation of chronic obstructive bronchitis ([place]) 08/04/2015  . Infection of urinary tract 07/01/2014  . Bradycardia 11/24/2013  . Fall in home 11/22/2013  . Pulmonary hypertension ([place]) 11/19/2013  . Excessive falling 09/07/2012  . Encounter for general adult medical examination without abnormal findings 03/24/2012  . Atrial paroxysmal tachycardia ([place])  10/05/2011  . Family history of colon cancer 05/15/2011  . Mass of pelvis 05/15/2011   PCP:  Merlene Laughter, MD Pharmacy:   [facility], [address] [place] [place] [address] Phone: [phone] Fax: [phone]     Social Determinants of Health (SDOH) Interventions    Readmission Risk Interventions No flowsheet data found.

## 2019-07-17 NOTE — Progress Notes (Signed)
PROGRESS NOTE    Paula Bird  ZDG:[id number] DOB: [birth year] DOA: 07/14/2019 PCP: Merlene Laughter, MD      Brief Narrative:  Paula Bird is a 86 y.o. F with hx COPD not on home oxygen, HTN, A. fib, CKD, hypothyroidism, iron deficiency anemia, and depression who presented from ALF with worsening cough, shortness of breath, body aches.  In the ER Covid positive, chest x-ray showed slight opacities bilaterally.  Overnight requiring oxygen.      Assessment & Plan:  Acute hypoxic respiratory failure due to Covid pneumonitis Afebrile, weaned to room air.  -Continue remdesivir, day 4 of 5 -Continue steroids, day 4   COPD -Continue albuterol scheduled -Continue pantoprazole -Continue Incruse and Dulera  Diabetes Glucoses normal -Continue sliding scale corrections  Hypertension BP normal -Continue diltiazem  A. Fib, chronic Not on anticoagulant, rate normal -Continue amiodarone, diltiazem  CKD ruled out Cr normal, stable relative to baseline  Anemia -Hold iron  Thyroid disease -Continue Tapazole daily -Continue CellCept  Depression, anxiety -Continue Elavil -Continue Ambien at night -Continue Atarax for anxiety  IBS/GERD -Continue Protonix, Linzess, sucralfate  Hypokalemia Repleted        Disposition: Status is: Inpatient  Remains inpatient appropriate because: Unsafe D/c plan   Dispo: The patient is from: ALF              Anticipated d/c is to: SNF              Anticipated d/c date is: 1 day              Patient currently is medically stable to d/c.  We have insurance authorization but due to COVID isolation requirements, patient cannot return to ALF and erquires SNF placement to complete her isolation.  This bed is available tomorrow               MDM: The below labs and imaging reports reviewed and summarized above.  Medication management as above.   DVT prophylaxis: Lovenox Code Status: DO NOT RESUSCITATE Family  Communication: Attempted call to son by phone yesterday, no answer, left voicemail, will attempt to call again today.      Consultants:   Procedures:     Antimicrobials:      Culture data:              Subjective: Patient is feeling better.  No chest pain, no shortness of breath, no respiratory distress, no fever, no hemoptysis.  Overall she is very tired.  Her sleep was better last night.      Objective: Vitals:   07/16/19 0600 07/16/19 1331 07/16/19 2150 07/17/19 1321  BP:  (!) 134/96 (!) 130/92 132/90  Pulse: (!) 107 91 88 80  Resp:  18 20 18   Temp:  97.9 F (36.6 C) (!) 97.5 F (36.4 C) 97.9 F (36.6 C)  TempSrc:  Oral Oral Oral  SpO2:  96% 94% 96%  Weight:      Height:        Intake/Output Summary (Last 24 hours) at 07/17/2019 1400 Last data filed at 07/17/2019 1150 Gross per 24 hour  Intake 390 ml  Output --  Net 390 ml   Filed Weights   07/15/19 0500  Weight: 67 kg    Examination: General appearance: Elderly obese female, sitting up in bed, no obvious distress, interactive HEENT: Anicteric, conjunctival pink, lids and lashes normal.  No nasal deformity, discharge, or epistaxis.  Dentures are not in place today.  Lips normal, oropharynx tacky  dry, no oral lesions, hearing diminished. Skin: Skin warm and dry, no suspicious rashes or lesions Cardiac: Regular rate and rhythm, no murmurs, JVP normal, no lower extremity edema Respiratory: Respiratory rate and rhythm, lungs clear without rales or wheezes, diminished bilaterally. Abdomen: Soft without tenderness palpation or guarding. MSK: No deformities or effusions of the large joints of the upper or lower extremities bilaterally.  Diminished muscle bulk and tone. Neuro: Awake and alert, extraocular movements intact, moves all extremities with generalized weakness, normal coordination, speech fluent. Psych: Sensorium intact responding questions, attention normal, affect normal, judgment insight  appear normal.    Data Reviewed: I have personally reviewed following labs and imaging studies:  CBC: Recent Labs  Lab 07/14/19 1357 07/15/19 0415 07/16/19 0126  WBC 6.3 3.2* 7.5  NEUTROABS 4.9 2.8  --   HGB 15.1* 14.8 15.1*  HCT 48.4* 48.4* 48.1*  MCV 90.6 91.7 89.6  PLT 124* 132* 128*   Basic Metabolic Panel: Recent Labs  Lab 07/14/19 1357 07/15/19 0415 07/16/19 0126  NA 141 141 139  K 2.9* 3.0* 3.8  CL 102 108 104  CO2 26 25 24   GLUCOSE 105* 155* 141*  BUN 9 13 18   CREATININE 0.48 0.45 0.62  CALCIUM 8.7* 8.6* 8.8*   GFR: Estimated Creatinine Clearance: 39.1 mL/min (by C-G formula based on SCr of 0.62 mg/dL). Liver Function Tests: Recent Labs  Lab 07/14/19 1357 07/15/19 0415 07/16/19 0126  AST 18 48* 53*  ALT 19 59* 87*  ALKPHOS 82 76 73  BILITOT 0.6 0.5 0.5  PROT 7.2 6.8 6.7  ALBUMIN 3.8 3.4* 3.4*   No results for input(s): LIPASE, AMYLASE in the last 168 hours. No results for input(s): AMMONIA in the last 168 hours. Coagulation Profile: No results for input(s): INR, PROTIME in the last 168 hours. Cardiac Enzymes: No results for input(s): CKTOTAL, CKMB, CKMBINDEX, TROPONINI in the last 168 hours. BNP (last 3 results) No results for input(s): PROBNP in the last 8760 hours. HbA1C: No results for input(s): HGBA1C in the last 72 hours. CBG: Recent Labs  Lab 07/16/19 1140 07/16/19 1646 07/16/19 2154 07/17/19 0738 07/17/19 1149  GLUCAP 134* 96 138* 143* 203*   Lipid Profile: No results for input(s): CHOL, HDL, LDLCALC, TRIG, CHOLHDL, LDLDIRECT in the last 72 hours. Thyroid Function Tests: No results for input(s): TSH, T4TOTAL, FREET4, T3FREE, THYROIDAB in the last 72 hours. Anemia Panel: Recent Labs    07/15/19 0415  FERRITIN 110   Urine analysis:    Component Value Date/Time   COLORURINE YELLOW 08/25/2018 1157   APPEARANCEUR CLEAR 08/25/2018 1157   LABSPEC 1.011 08/25/2018 1157   PHURINE 7.0 08/25/2018 1157   GLUCOSEU NEGATIVE  08/25/2018 1157   HGBUR NEGATIVE 08/25/2018 1157   BILIRUBINUR NEGATIVE 08/25/2018 1157   KETONESUR NEGATIVE 08/25/2018 1157   PROTEINUR NEGATIVE 08/25/2018 1157   UROBILINOGEN 0.2 08/13/2014 2256   NITRITE NEGATIVE 08/25/2018 1157   LEUKOCYTESUR TRACE (A) 08/25/2018 1157   Sepsis Labs: @LABRCNTIP (procalcitonin:4,lacticacidven:4)  ) Recent Results (from the past 240 hour(s))  Blood Culture (routine x 2)     Status: None (Preliminary result)   Collection Time: 07/14/19  1:09 PM   Specimen: BLOOD RIGHT HAND  Result Value Ref Range Status   Specimen Description   Final    BLOOD RIGHT HAND Performed at [facility], [address], [place], [place]    Special Requests   Final    BOTTLES DRAWN AEROBIC AND ANAEROBIC Blood Culture adequate volume Performed  at [facility], [address], [place], [place] [address]    Culture   Final    NO GROWTH 3 DAYS Performed at [facility], [address], [place], [place] [address]    Report Status PENDING  Incomplete  Blood Culture (routine x 2)     Status: None (Preliminary result)   Collection Time: 07/14/19  3:59 PM   Specimen: BLOOD  Result Value Ref Range Status   Specimen Description   Final    BLOOD RIGHT ANTECUBITAL Performed at [facility], [address], [place], [place] [address]    Special Requests   Final    BOTTLES DRAWN AEROBIC AND ANAEROBIC Blood Culture results may not be optimal due to an inadequate volume of blood received in culture bottles Performed at [facility], [address] [place], [place] [address]    Culture   Final    NO GROWTH 3 DAYS Performed at [facility], [address], [place], [place] [address]    Report Status PENDING  Incomplete         Radiology Studies: No results found.      Scheduled Meds: . albuterol  2 puff Inhalation Q6H  . amiodarone  200 mg Oral Daily  . amitriptyline   100 mg Oral QHS  . vitamin C  500 mg Oral Daily  . dexamethasone (DECADRON) injection  6 mg Intravenous Q24H  . diltiazem  300 mg Oral Daily  . enoxaparin (LOVENOX) injection  40 mg Subcutaneous Q24H  . feeding supplement (ENSURE ENLIVE)  237 mL Oral BID BM  . fluticasone furoate-vilanterol  1 puff Inhalation Daily  . insulin aspart  0-5 Units Subcutaneous QHS  . insulin aspart  0-9 Units Subcutaneous TID WC  . linaclotide  145 mcg Oral QAC breakfast  . methimazole  10 mg Oral Daily  . multivitamin with minerals  1 tablet Oral Daily  . mycophenolate  500 mg Oral BID  . pantoprazole  40 mg Oral BID  . sucralfate  1 g Oral QID  . umeclidinium bromide  1 puff Inhalation Daily  . zinc sulfate  220 mg Oral Daily  . zolpidem  5 mg Oral QHS   Continuous Infusions: . remdesivir 100 mg in NS 100 mL 100 mg (07/17/19 1220)     LOS: 3 days    Time spent: 25 minutes    Alberteen Sam, MD [facility] 07/17/2019, 2:00 PM     Please page though AMION or Epic secure chat:  For [employer], Higher education careers adviser

## 2019-07-17 NOTE — TOC Progression Note (Signed)
Transition of Care [facility]) - Progression Note    Patient Details  Name: Paula Bird MRN: [id number] Date of Birth: 1926-03-05  [facility] CM/SW Contact  Paula Bird, [place] Phone Number: 07/17/2019, 3:51 PM  Clinical Narrative:     CSW presented bed offers to patient's son, he chose [facility].  CSW spoke to [facility] and they can accept patient over weekend once she is medically ready for discharge.  CSW received insurance approval from [facility], [name] is [id number], start date 07/18/19, next review 4/27, Paula Bird is case manager following patient at [facility].  CSW updated physician.   Expected Discharge Plan: Skilled Nursing Facility Barriers to Discharge: Continued Medical Work up  Expected Discharge Plan and Services Expected Discharge Plan: Skilled Nursing Facility     Post Acute Care Choice: Skilled Nursing Facility Living arrangements for the past 2 months: Assisted Living Facility                   DME Agency: NA       HH Arranged: NA           Social Determinants of Health (SDOH) Interventions    Readmission Risk Interventions No flowsheet data found.

## 2019-07-18 LAB — GLUCOSE, CAPILLARY
Glucose-Capillary: 149 mg/dL — ABNORMAL HIGH (ref 70–99)
Glucose-Capillary: 183 mg/dL — ABNORMAL HIGH (ref 70–99)

## 2019-07-18 MED ORDER — GUAIFENESIN-DM 100-10 MG/5ML PO SYRP: 10.0000 mL | ORAL_SOLUTION | ORAL | 0 refills | Status: AC | PRN

## 2019-07-18 MED ORDER — HYDROCODONE-ACETAMINOPHEN 5-325 MG PO TABS: 1.0000 | ORAL_TABLET | ORAL | 0 refills | Status: AC | PRN

## 2019-07-18 MED ORDER — ZOLPIDEM TARTRATE 10 MG PO TABS: 10.0000 mg | ORAL_TABLET | Freq: Every day | ORAL | 0 refills | Status: AC

## 2019-07-18 NOTE — Discharge Summary (Signed)
Physician Discharge Summary  Paula Bird VEH:[id number] DOB: May 30, 1925 DOA: 07/14/2019  PCP: Paula Cox, MD  Admit date: 07/14/2019 Discharge date: 07/18/2019  Ad[facility] From: [facility] Disposition:  [name]   Recommendations for Outpatient Follow-up:  1. Follow up with PCP in 1 month 2. Please maintain COVID isolation from 10 days from first symptoms (4/19) or until determined by [facility] infection prevention team      Home Health: TBD at SNF Equipment/Devices: TBD at SNF  Discharge Condition: Good, baseline CODE STATUS: DNR Diet recommendation: Regular  Brief/Interim Summary: Paula Bird is a 86 y.o. F with hx COPD not on home oxygen, DM, HTN, A. fib, CKD, hypothyroidism, iron deficiency anemia, and depression who presented from ALF with worsening cough, shortness of breath, body aches.  In the ER Covid positive, chest x-ray showed slight opacities bilaterally.  Overnight requiring oxygen.         PRINCIPAL HOSPITAL DIAGNOSIS: COVID-19    Discharge Diagnoses:    Acute hypoxic respiratory failure due to Covid pneumonitis Patient admitted and started on remdesivir and steroids.  She was quickly weaned off oxygen and her symptoms have completely resolved.    Remdesivir and steroids can be discontinued.  Continue isolation precautions per [facility] and per date of initial positive test and initial symptoms at [facility].     COPD Continue Incruse, Dulera, and pantoprazole  Diabetes ruled out Patient with prior history diabetes, none currently.  Hypertension Continue diltiazem  A. Fib, chronic Not on anticoagulant at baseline.  Continue amiodarone, diltiazem.  CKD ruled out Cr normal, stable relative to baseline  Anemia Resume iron at discharge.  Thyroid disease Continue Tapazole and CellCept.  Depression, anxiety Continue Elavil, Ambien, Atarax  IBS/GERD Continue Protonix, Linzess,  sucralfate  Hypokalemia Repleted           Discharge Instructions  Discharge Instructions    Increase activity slowly   Complete by: As directed      Allergies as of 07/18/2019   No Known Allergies     Medication List    TAKE these medications   amiodarone 200 MG tablet Commonly known as: PACERONE Take 200 mg by mouth daily.   amitriptyline 50 MG tablet Commonly known as: ELAVIL Take 100 mg by mouth at bedtime. 2 tabs (100mg ) at hs   Artificial Tears 1.4 % ophthalmic solution Generic drug: polyvinyl alcohol Place 1 drop into both eyes 3 (three) times daily.   bisacodyl 10 MG suppository Commonly known as: DULCOLAX Place 10 mg rectally daily as needed for moderate constipation.   Breo Ellipta 100-25 MCG/INH Aepb Generic drug: fluticasone furoate-vilanterol Take 1 puff by mouth daily.   diltiazem 300 MG 24 hr tablet Commonly known as: CARDIZEM LA Take 300 mg by mouth daily.   esomeprazole 40 MG capsule Commonly known as: NEXIUM Take 40 mg by mouth daily.   ferrous sulfate 325 (65 FE) MG tablet Take 325 mg by mouth daily with breakfast.   guaiFENesin-dextromethorphan 100-10 MG/5ML syrup Commonly known as: ROBITUSSIN DM Take 10 mLs by mouth every 4 (four) hours as needed for cough.   HYDROcodone-acetaminophen 5-325 MG tablet Commonly known as: NORCO/VICODIN Take 1 tablet by mouth every 4 (four) hours as needed for moderate pain.   hydrOXYzine 25 MG tablet Commonly known as: ATARAX/VISTARIL Take 25 mg by mouth every 8 (eight) hours as needed for anxiety.   Incruse Ellipta 62.5 MCG/INH Aepb Generic drug: umeclidinium bromide Take 1 puff by mouth daily.   Linzess 145  MCG Caps capsule Generic drug: linaclotide Take 145 mcg by mouth daily before breakfast.   magnesium citrate Soln Take 1 Bottle by mouth daily as needed for moderate constipation. q24h prn constipation   methimazole 10 MG tablet Commonly known as: TAPAZOLE Take 10 mg by  mouth daily.   MULTIVITAMIN ADULT PO Take 2 capsules by mouth daily. [employer] AREDS 2 Cap 2 caps qd   multivitamin with minerals Tabs tablet Take 1 tablet by mouth daily.   mycophenolate 500 MG tablet Commonly known as: CellCept Take 1 tablet (500 mg total) by mouth 2 (two) times daily.   ondansetron 8 MG tablet Commonly known as: ZOFRAN Take 8 mg by mouth every 8 (eight) hours as needed for nausea or vomiting.   polyethylene glycol 17 g packet Commonly known as: MIRALAX / GLYCOLAX Take 17 g by mouth daily. What changed: when to take this   senna 8.6 MG tablet Commonly known as: SENOKOT Take 2 tablets by mouth daily. senolot S 8.6-50mg  qd   sucralfate 1 g tablet Commonly known as: CARAFATE Take 1 g by mouth 4 (four) times daily.   SUMAtriptan 50 MG tablet Commonly known as: IMITREX Take 50 mg by mouth daily as needed for migraine. 1 tab every 24 hours as needed for migraine. Limit to 10 days /month to avoid medication overuse.   zolpidem 10 MG tablet Commonly known as: AMBIEN Take 1 tablet (10 mg total) by mouth at bedtime.      Follow-up Information    Dasanayaka, Paula Pigg, MD. Schedule an appointment as soon as possible for a visit in 1 month(s).   Specialty: Internal Medicine Contact information: [address] [place] [address] [phone]          No Known Allergies  Consultations:     Procedures/Studies: DG Chest Port 1 View  Result Date: 07/14/2019 CLINICAL DATA:  Cough. EXAM: PORTABLE CHEST 1 VIEW COMPARISON:  August 25, 2018. FINDINGS: Stable cardiomegaly. No pneumothorax or pleural effusion is noted. Minimal bibasilar subsegmental atelectasis is noted. Bony thorax is unremarkable. IMPRESSION: Minimal bibasilar subsegmental atelectasis. Electronically Signed   By: Lupita Raider M.D.   On: 07/14/2019 15:03      Subjective: Feeling anxious.  No respiratory symptoms, cough, dyspnea, sputum, hemoptysis.  No confusion, no  fever.  Discharge Exam: Vitals:   07/17/19 2106 07/18/19 0548  BP: 103/89 132/84  Pulse: 91 (!) 107  Resp: 16 16  Temp: 97.6 F (36.4 C) 98.4 F (36.9 C)  SpO2: 93% 95%   Vitals:   07/16/19 2150 07/17/19 1321 07/17/19 2106 07/18/19 0548  BP: (!) 130/92 132/90 103/89 132/84  Pulse: 88 80 91 (!) 107  Resp: 20 18 16 16   Temp: (!) 97.5 F (36.4 C) 97.9 F (36.6 C) 97.6 F (36.4 C) 98.4 F (36.9 C)  TempSrc: Oral Oral Oral Oral  SpO2: 94% 96% 93% 95%  Weight:      Height:        General: Pt is alert, awake, not in acute distress, lying in bed, legs contractured at baseline. Cardiovascular: RRR, nl S1-S2, no murmurs appreciated.   No LE edema.   Respiratory: Normal respiratory rate and rhythm.  CTAB without rales or wheezes. Abdominal: Abdomen soft and non-tender.  No distension or HSM.   Neuro/Psych: Strength symmetric in upper and lower extremities.  Judgment and insight appear normal.  Affect anxious.   The results of significant diagnostics from this hospitalization (including imaging, microbiology,  ancillary and laboratory) are listed below for reference.     Microbiology: Recent Results (from the past 240 hour(s))  Blood Culture (routine x 2)     Status: None (Preliminary result)   Collection Time: 07/14/19  1:09 PM   Specimen: BLOOD RIGHT HAND  Result Value Ref Range Status   Specimen Description   Final    BLOOD RIGHT HAND Performed at [facility], [place] [address] [place], [place] [address]    Special Requests   Final    BOTTLES DRAWN AEROBIC AND ANAEROBIC Blood Culture adequate volume Performed at [place] [address] [place], [place] [address]    Culture   Final    NO GROWTH 3 DAYS Performed at [facility], [place] [address] [place], [place] [address]    Report Status PENDING  Incomplete  Blood Culture (routine x 2)     Status: None (Preliminary result)   Collection Time: 07/14/19  3:59 PM   Specimen:  BLOOD  Result Value Ref Range Status   Specimen Description   Final    BLOOD RIGHT ANTECUBITAL Performed at [place] [address] [place], [place] [address]    Special Requests   Final    BOTTLES DRAWN AEROBIC AND ANAEROBIC Blood Culture results may not be optimal due to an inadequate volume of blood received in culture bottles Performed at [place] [address] [place], [place] [address]    Culture   Final    NO GROWTH 3 DAYS Performed at [facility], [place] [address] [place], [place] [address]    Report Status PENDING  Incomplete     Labs: BNP (last 3 results) No results for input(s): BNP in the last 8760 hours. Basic Metabolic Panel: Recent Labs  Lab 07/14/19 1357 07/15/19 0415 07/16/19 0126  NA 141 141 139  K 2.9* 3.0* 3.8  CL 102 108 104  CO2 26 25 24   GLUCOSE 105* 155* 141*  BUN 9 13 18   CREATININE 0.48 0.45 0.62  CALCIUM 8.7* 8.6* 8.8*   Liver Function Tests: Recent Labs  Lab 07/14/19 1357 07/15/19 0415 07/16/19 0126  AST 18 48* 53*  ALT 19 59* 87*  ALKPHOS 82 76 73  BILITOT 0.6 0.5 0.5  PROT 7.2 6.8 6.7  ALBUMIN 3.8 3.4* 3.4*   No results for input(s): LIPASE, AMYLASE in the last 168 hours. No results for input(s): AMMONIA in the last 168 hours. CBC: Recent Labs  Lab 07/14/19 1357 07/15/19 0415 07/16/19 0126  WBC 6.3 3.2* 7.5  NEUTROABS 4.9 2.8  --   HGB 15.1* 14.8 15.1*  HCT 48.4* 48.4* 48.1*  MCV 90.6 91.7 89.6  PLT 124* 132* 128*   Cardiac Enzymes: No results for input(s): CKTOTAL, CKMB, CKMBINDEX, TROPONINI in the last 168 hours. BNP: Invalid input(s): POCBNP CBG: Recent Labs  Lab 07/17/19 0738 07/17/19 1149 07/17/19 1556 07/17/19 2108 07/18/19 0813  GLUCAP 143* 203* 196* 114* 149*   D-Dimer No results for input(s): DDIMER in the last 72 hours. Hgb A1c No results for input(s): HGBA1C in the last 72 hours. Lipid Profile No results for input(s): CHOL, HDL, LDLCALC,  TRIG, CHOLHDL, LDLDIRECT in the last 72 hours. Thyroid function studies No results for input(s): TSH, T4TOTAL, T3FREE, THYROIDAB in the last 72 hours.  Invalid input(s): FREET3 Anemia work up No results for input(s): VITAMINB12, FOLATE, FERRITIN, TIBC, IRON, RETICCTPCT in the last 72 hours. Urinalysis  Component Value Date/Time   COLORURINE YELLOW 08/25/2018 1157   APPEARANCEUR CLEAR 08/25/2018 1157   LABSPEC 1.011 08/25/2018 1157   PHURINE 7.0 08/25/2018 1157   GLUCOSEU NEGATIVE 08/25/2018 1157   HGBUR NEGATIVE 08/25/2018 1157   BILIRUBINUR NEGATIVE 08/25/2018 1157   KETONESUR NEGATIVE 08/25/2018 1157   PROTEINUR NEGATIVE 08/25/2018 1157   UROBILINOGEN 0.2 08/13/2014 2256   NITRITE NEGATIVE 08/25/2018 1157   LEUKOCYTESUR TRACE (A) 08/25/2018 1157   Sepsis Labs Invalid input(s): PROCALCITONIN,  WBC,  LACTICIDVEN Microbiology Recent Results (from the past 240 hour(s))  Blood Culture (routine x 2)     Status: None (Preliminary result)   Collection Time: 07/14/19  1:09 PM   Specimen: BLOOD RIGHT HAND  Result Value Ref Range Status   Specimen Description   Final    BLOOD RIGHT HAND Performed at [facility], [address], [place], [place] [address]    Special Requests   Final    BOTTLES DRAWN AEROBIC AND ANAEROBIC Blood Culture adequate volume Performed at [facility], [address] [place], [place] [address]    Culture   Final    NO GROWTH 3 DAYS Performed at [facility], [address], [place], [place] [address]    Report Status PENDING  Incomplete  Blood Culture (routine x 2)     Status: None (Preliminary result)   Collection Time: 07/14/19  3:59 PM   Specimen: BLOOD  Result Value Ref Range Status   Specimen Description   Final    BLOOD RIGHT ANTECUBITAL Performed at [facility], [address] [place], [place] [address]    Special Requests   Final    BOTTLES DRAWN AEROBIC AND ANAEROBIC Blood  Culture results may not be optimal due to an inadequate volume of blood received in culture bottles Performed at [facility], [address], [place], [place] [address]    Culture   Final    NO GROWTH 3 DAYS Performed at [facility], [address], [place], [place] [address]    Report Status PENDING  Incomplete     Time coordinating discharge: 35 minutes The [place] controlled substances registry was reviewed for this patient prior to filling the <5 days supply controlled substances script.      SIGNED:   Alberteen Sam, MD  [facility] 07/18/2019, 9:56 AM

## 2019-07-18 NOTE — Discharge Instructions (Signed)
10 Things You Can Do to Manage Your COVID-19 Symptoms at Home If you have possible or confirmed COVID-19: 1. Stay home from work and school. And stay away from other public places. If you must go out, avoid using any kind of public transportation, ridesharing, or taxis. 2. Monitor your symptoms carefully. If your symptoms get worse, call your healthcare provider immediately. 3. Get rest and stay hydrated. 4. If you have a medical appointment, call the healthcare provider ahead of time and tell them that you have or may have COVID-19. 5. For medical emergencies, call 911 and notify the dispatch personnel that you have or may have COVID-19. 6. Cover your cough and sneezes with a tissue or use the inside of your elbow. 7. Wash your hands often with soap and water for at least 20 seconds or clean your hands with an alcohol-based hand sanitizer that contains at least 60% alcohol. 8. As much as possible, stay in a specific room and away from other people in your home. Also, you should use a separate bathroom, if available. If you need to be around other people in or outside of the home, wear a mask. 9. Avoid sharing personal items with other people in your household, like dishes, towels, and bedding. 10. Clean all surfaces that are touched often, like counters, tabletops, and doorknobs. Use household cleaning sprays or wipes according to the label instructions. cdc.gov/coronavirus 09/24/2018 This information is not intended to replace advice given to you by your health care provider. Make sure you discuss any questions you have with your health care provider. Document Revised: 02/26/2019 Document Reviewed: 02/26/2019 Elsevier Patient Education  2020 Else[employer] Inc.   COVID-19 COVID-19 is a respiratory infection that is caused by a virus called severe acute respiratory syndrome coronavirus 2 (SARS-CoV-2). The disease is also known as coronavirus disease or novel coronavirus. In some people, the virus may  not cause any symptoms. In others, it may cause a serious infection. The infection can get worse quickly and can lead to complications, such as:  Pneumonia, or infection of the lungs.  Acute respiratory distress syndrome or ARDS. This is a condition in which fluid build-up in the lungs prevents the lungs from filling with air and passing oxygen into the blood.  Acute respiratory failure. This is a condition in which there is not enough oxygen passing from the lungs to the body or when carbon dioxide is not passing from the lungs out of the body.  Sepsis or septic shock. This is a serious bodily reaction to an infection.  Blood clotting problems.  Secondary infections due to bacteria or fungus.  Organ failure. This is when your body's organs stop working. The virus that causes COVID-19 is contagious. This means that it can spread from person to person through droplets from coughs and sneezes (respiratory secretions). What are the causes? This illness is caused by a virus. You may catch the virus by:  Breathing in droplets from an infected person. Droplets can be spread by a person breathing, speaking, singing, coughing, or sneezing.  Touching something, like a table or a doorknob, that was exposed to the virus (contaminated) and then touching your mouth, nose, or eyes. What increases the risk? Risk for infection You are more likely to be infected with this virus if you:  Are within 6 feet (2 meters) of a person with COVID-19.  Provide care for or live with a person who is infected with COVID-19.  Spend time in crowded indoor spaces or   live in shared housing. Risk for serious illness You are more likely to become seriously ill from the virus if you:  Are 50 years of age or older. The higher your age, the more you are at risk for serious illness.  Live in a nursing home or long-term care facility.  Have cancer.  Have a long-term (chronic) disease such as: ? Chronic lung disease,  including chronic obstructive pulmonary disease or asthma. ? A long-term disease that lowers your body's ability to fight infection (immunocompromised). ? Heart disease, including heart failure, a condition in which the arteries that lead to the heart become narrow or blocked (coronary artery disease), a disease which makes the heart muscle thick, weak, or stiff (cardiomyopathy). ? Diabetes. ? Chronic kidney disease. ? Sickle cell disease, a condition in which red blood cells have an abnormal "sickle" shape. ? Liver disease.  Are obese. What are the signs or symptoms? Symptoms of this condition can range from mild to severe. Symptoms may appear any time from 2 to 14 days after being exposed to the virus. They include:  A fever or chills.  A cough.  Difficulty breathing.  Headaches, body aches, or muscle aches.  Runny or stuffy (congested) nose.  A sore throat.  New loss of taste or smell. Some people may also have stomach problems, such as nausea, vomiting, or diarrhea. Other people may not have any symptoms of COVID-19. How is this diagnosed? This condition may be diagnosed based on:  Your signs and symptoms, especially if: ? You live in an area with a COVID-19 outbreak. ? You recently traveled to or from an area where the virus is common. ? You provide care for or live with a person who was diagnosed with COVID-19. ? You were exposed to a person who was diagnosed with COVID-19.  A physical exam.  Lab tests, which may include: ? Taking a sample of fluid from the back of your nose and throat (nasopharyngeal fluid), your nose, or your throat using a swab. ? A sample of mucus from your lungs (sputum). ? Blood tests.  Imaging tests, which may include, X-rays, CT scan, or ultrasound. How is this treated? At present, there is no medicine to treat COVID-19. Medicines that treat other diseases are being used on a trial basis to see if they are effective against COVID-19. Your  health care provider will talk with you about ways to treat your symptoms. For most people, the infection is mild and can be managed at home with rest, fluids, and over-the-counter medicines. Treatment for a serious infection usually takes places in a hospital intensive care unit (ICU). It may include one or more of the following treatments. These treatments are given until your symptoms improve.  Receiving fluids and medicines through an IV.  Supplemental oxygen. Extra oxygen is given through a tube in the nose, a face mask, or a hood.  Positioning you to lie on your stomach (prone position). This makes it easier for oxygen to get into the lungs.  Continuous positive airway pressure (CPAP) or bi-level positive airway pressure (BPAP) machine. This treatment uses mild air pressure to keep the airways open. A tube that is connected to a motor delivers oxygen to the body.  Ventilator. This treatment moves air into and out of the lungs by using a tube that is placed in your windpipe.  Tracheostomy. This is a procedure to create a hole in the neck so that a breathing tube can be inserted.  Extracorporeal membrane   oxygenation (ECMO). This procedure gives the lungs a chance to recover by taking over the functions of the heart and lungs. It supplies oxygen to the body and removes carbon dioxide. Follow these instructions at home: Lifestyle  If you are sick, stay home except to get medical care. Your health care provider will tell you how long to stay home. Call your health care provider before you go for medical care.  Rest at home as told by your health care provider.  Do not use any products that contain nicotine or tobacco, such as cigarettes, e-cigarettes, and chewing tobacco. If you need help quitting, ask your health care provider.  Return to your normal activities as told by your health care provider. Ask your health care provider what activities are safe for you. General  instructions  Take over-the-counter and prescription medicines only as told by your health care provider.  Drink enough fluid to keep your urine pale yellow.  Keep all follow-up visits as told by your health care provider. This is important. How is this prevented?  There is no vaccine to help prevent COVID-19 infection. However, there are steps you can take to protect yourself and others from this virus. To protect yourself:   Do not travel to areas where COVID-19 is a risk. The areas where COVID-19 is reported change often. To identify high-risk areas and travel restrictions, check the CDC travel website: wwwnc.cdc.gov/travel/notices  If you live in, or must travel to, an area where COVID-19 is a risk, take precautions to avoid infection. ? Stay away from people who are sick. ? Wash your hands often with soap and water for 20 seconds. If soap and water are not available, use an alcohol-based hand sanitizer. ? Avoid touching your mouth, face, eyes, or nose. ? Avoid going out in public, follow guidance from your state and local health authorities. ? If you must go out in public, wear a cloth face covering or face mask. Make sure your mask covers your nose and mouth. ? Avoid crowded indoor spaces. Stay at least 6 feet (2 meters) away from others. ? Disinfect objects and surfaces that are frequently touched every day. This may include:  Counters and tables.  Doorknobs and light switches.  Sinks and faucets.  Electronics, such as phones, remote controls, keyboards, computers, and tablets. To protect others: If you have symptoms of COVID-19, take steps to prevent the virus from spreading to others.  If you think you have a COVID-19 infection, contact your health care provider right away. Tell your health care team that you think you may have a COVID-19 infection.  Stay home. Leave your house only to seek medical care. Do not use public transport.  Do not travel while you are  sick.  Wash your hands often with soap and water for 20 seconds. If soap and water are not available, use alcohol-based hand sanitizer.  Stay away from other members of your household. Let healthy household members care for children and pets, if possible. If you have to care for children or pets, wash your hands often and wear a mask. If possible, stay in your own room, separate from others. Use a different bathroom.  Make sure that all people in your household wash their hands well and often.  Cough or sneeze into a tissue or your sleeve or elbow. Do not cough or sneeze into your hand or into the air.  Wear a cloth face covering or face mask. Make sure your mask covers your nose   and mouth. Where to find more information  Centers for Disease Control and Prevention: PurpleGadgets.be  World Health Organization: https://www.castaneda.info/ Contact a health care provider if:  You live in or have traveled to an area where COVID-19 is a risk and you have symptoms of the infection.  You have had contact with someone who has COVID-19 and you have symptoms of the infection. Get help right away if:  You have trouble breathing.  You have pain or pressure in your chest.  You have confusion.  You have bluish lips and fingernails.  You have difficulty waking from sleep.  You have symptoms that get worse. These symptoms may represent a serious problem that is an emergency. Do not wait to see if the symptoms will go away. Get medical help right away. Call your local emergency services (911 in the [place] Do not drive yourself to the hospital. Let the emergency medical personnel know if you think you have COVID-19. Summary  COVID-19 is a respiratory infection that is caused by a virus. It is also known as coronavirus disease or novel coronavirus. It can cause serious infections, such as pneumonia, acute respiratory distress syndrome, acute respiratory failure,  or sepsis.  The virus that causes COVID-19 is contagious. This means that it can spread from person to person through droplets from breathing, speaking, singing, coughing, or sneezing.  You are more likely to develop a serious illness if you are 56 years of age or older, have a weak immune system, live in a nursing home, or have chronic disease.  There is no medicine to treat COVID-19. Your health care provider will talk with you about ways to treat your symptoms.  Take steps to protect yourself and others from infection. Wash your hands often and disinfect objects and surfaces that are frequently touched every day. Stay away from people who are sick and wear a mask if you are sick. This information is not intended to replace advice given to you by your health care provider. Make sure you discuss any questions you have with your health care provider. Document Revised: 01/09/2019 Document Reviewed: 04/17/2018 Elsevier Patient Education  2020 [place].  COVID-19: How to Protect Yourself and Others Know how it spreads  There is currently no vaccine to prevent coronavirus disease 2019 (COVID-19).  The best way to prevent illness is to avoid being exposed to this virus.  The virus is thought to spread mainly from person-to-person. ? Between people who are in close contact with one another (within about 6 feet). ? Through respiratory droplets produced when an infected person coughs, sneezes or talks. ? These droplets can land in the mouths or noses of people who are nearby or possibly be inhaled into the lungs. ? COVID-19 may be spread by people who are not showing symptoms. Everyone should Clean your hands often  Wash your hands often with soap and water for at least 20 seconds especially after you have been in a public place, or after blowing your nose, coughing, or sneezing.  If soap and water are not readily available, use a hand sanitizer that contains at least 60% alcohol. Cover  all surfaces of your hands and rub them together until they feel dry.  Avoid touching your eyes, nose, and mouth with unwashed hands. Avoid close contact  Limit contact with others as much as possible.  Avoid close contact with people who are sick.  Put distance between yourself and other people. ? Remember that some people without symptoms may be  able to spread virus. ? This is especially important for people who are at higher risk of getting very GainPain.com.cy Cover your mouth and nose with a mask when around others  You could spread COVID-19 to others even if you do not feel sick.  Everyone should wear a mask in public settings and when around people not living in their household, especially when social distancing is difficult to maintain. ? Masks should not be placed on young children under age 61, anyone who has trouble breathing, or is unconscious, incapacitated or otherwise unable to remove the mask without assistance.  The mask is meant to protect other people in case you are infected.  Do NOT use a facemask meant for a Dietitian.  Continue to keep about 6 feet between yourself and others. The mask is not a substitute for social distancing. Cover coughs and sneezes  Always cover your mouth and nose with a tissue when you cough or sneeze or use the inside of your elbow.  Throw used tissues in the trash.  Immediately wash your hands with soap and water for at least 20 seconds. If soap and water are not readily available, clean your hands with a hand sanitizer that contains at least 60% alcohol. Clean and disinfect  Clean AND disinfect frequently touched surfaces daily. This includes tables, doorknobs, light switches, countertops, handles, desks, phones, keyboards, toilets, faucets, and sinks. RackRewards.[email or web address]  If surfaces are  dirty, clean them: Use detergent or soap and water prior to disinfection.  Then, use a household disinfectant. You can see a list of EPA-registered household disinfectants here. [email or web address] 11/26/2018 This information is not intended to replace advice given to you by your health care provider. Make sure you discuss any questions you have with your health care provider. Document Revised: 12/04/2018 Document Reviewed: 10/02/2018 Elsevier Patient Education  [place].  3 Key Steps to Take While Waiting for Your COVID-19 Test Result To help stop the spread of COVID-19, take these 3 key steps NOW while waiting for your test results: 1. Stay home and monitor your health. Stay home and monitor your health to help protect your friends, family, and others from possibly getting COVID-19 from you. Stay home and away from others:  If possible, stay away from others, especially people who are at higher risk for getting very sick from COVID-19, such as older adults and people with other medical conditions.  If you have been in contact with someone with COVID-19, stay home and away from others for 14 days after your last contact with that person.  If you have a fever, cough or other symptoms of COVID-19, stay home and away from others (except to get medical care). Monitor your health:  Watch for fever, cough, shortness of breath, or other symptoms of COVID-19. Remember, symptoms may appear 2-14 days after exposure to COVID-19 and can include: ? Fever or chills ? Cough ? Shortness of breath or difficulty breathing ? Tiredness ? Muscle or body aches ? Headache ? New loss of taste or smell ? Sore throat ? Congestion or runny nose ? Nausea or vomiting ? Diarrhea 2. Think about the people you have recently been around. If you are diagnosed with [id number], a public health worker may call you to check on your health, discuss who you have been around, and ask where you spent time while  you may have been able to spread COVID-19 to others. While you wait for your COVID-19 test result, think about everyone you have  been around recently. This will be important information to give health workers if your test is positive.  Complete the information on the back of this page to help you remember everyone you have been around. 3. Answer the phone call from the health department. If a public health worker calls you, answer the call to help slow the spread of COVID-19 in your community.  Discussions with health department staff are confidential. This means that your personal and medical information will be kept private and only shared with those who may need to know, like your health care provider.  Your name will not be shared with those you came in contact with. The health department will only notify people you were in close contact with (within 6 feet for more than 15 minutes) that they might have been exposed to COVID-19. Think about the people you have recently been around If you test positive and are diagnosed with COVID-19, someone from the health department may call to check-in on your health, discuss who you have been around, and ask where you spent time while you may have been able to spread COVID-19 to others. This form can help you think about people you have recently been around so you will be ready if a public health worker calls you. Things to think about. Have you:  Gone to work or school?  Gotten together with others (eaten out at [employer], gone out for drinks, exercised with others or gone to a gym, had friends or family over to your house, volunteered, gone to a party, pool, or park)?  Gone to a store in person (e.g., grocery store, mall)?  Gone to in-person appointments (e.g., salon, barber, doctor's or dentist's office)?  Ridden in a car with others (e.g., [employer] or [employer]) or took public transportation?  Been inside a church, synagogue, mosque or other places of  worship? Who lives with you?  ______________________________________________________________________  ______________________________________________________________________  ______________________________________________________________________  ______________________________________________________________________ Who have you been around (within 6 feet for more than 15 minutes) in the last 10 days? (You may have more people to list than the space provided. If so, write on the front of this sheet or a separate piece of paper.) Name ______________________________________________  Phone number ____________________________________  Date you last saw them _____________________________  Where you last saw them ________________________________________________ Name ______________________________________________  Phone number ____________________________________  Date you last saw them _____________________________  Where you last saw them ________________________________________________ Name ______________________________________________  Phone number ____________________________________  Date you last saw them _____________________________  Where you last saw them ________________________________________________ Name ______________________________________________  Phone number ____________________________________  Date you last saw them _____________________________  Where you last saw them ________________________________________________ Name ______________________________________________  Phone number ____________________________________  Date you last saw them _____________________________  Where you last saw them ________________________________________________ What have you done in the last 10 days with other people? Activity _____________________________________________  Location _________________________________________  Date  ____________________________________________ Activity _____________________________________________  Location _________________________________________  Date ____________________________________________ Activity _____________________________________________  Location _________________________________________  Date ____________________________________________ Activity _____________________________________________  Location _________________________________________  Date ____________________________________________ Activity _____________________________________________  Location _________________________________________  Date ____________________________________________ [email or web address] 10/20/2018 This information is not intended to replace advice given to you by your health care provider. Make sure you discuss any questions you have with your health care provider. Document Revised: 02/26/2019 Document Reviewed: 02/26/2019 Elsevier Patient Education  [place].   COVID-19 Frequently Asked Questions COVID-19 (coronavirus disease) is an infection that is caused by a large family of viruses. Some viruses cause illness  in people and others cause illness in animals like camels, cats, and bats. In some cases, the viruses that cause illness in animals can spread to humans. Where did the coronavirus come from? In December 2019, [place] told the [facility]) of several cases of lung disease (human respiratory illness). These cases were linked to an open seafood and livestock market in the city of [place]. The link to the seafood and livestock market suggests that the virus may have spread from animals to humans. However, since that first outbreak in December, the virus has also been shown to spread from person to person. What is the name of the disease and the virus? Disease name Early on, this disease was called novel coronavirus. This is because scientists  determined that the disease was caused by a new (novel) respiratory virus. The World Health Organization [facility]) has now named the disease COVID-19, or coronavirus disease. Virus name The virus that causes the disease is called severe acute respiratory syndrome coronavirus 2 (SARS-CoV-2). More information on disease and virus naming World Health Organization [facility]): www.who.int/emergencies/diseases/novel-coronavirus-2019/technical-guidance/naming-the-coronavirus-disease-(covid-2019)-and-the-virus-that-causes-it Who is at risk for complications from coronavirus disease? Some people may be at higher risk for complications from coronavirus disease. This includes older adults and people who have chronic diseases, such as heart disease, diabetes, and lung disease. If you are at higher risk for complications, take these extra precautions:  Stay home as much as possible.  Avoid social gatherings and travel.  Avoid close contact with others. Stay at least 6 ft (2 m) away from others, if possible.  Wash your hands often with soap and water for at least 20 seconds.  Avoid touching your face, mouth, nose, or eyes.  Keep supplies on hand at home, such as food, medicine, and cleaning supplies.  If you must go out in public, wear a cloth face covering or face mask. Make sure your mask covers your nose and mouth. How does coronavirus disease spread? The virus that causes coronavirus disease spreads easily from person to person (is contagious). You may catch the virus by:  Breathing in droplets from an infected person. Droplets can be spread by a person breathing, speaking, singing, coughing, or sneezing.  Touching something, like a table or a doorknob, that was exposed to the virus (contaminated) and then touching your mouth, nose, or eyes. Can I get the virus from touching surfaces or objects? There is still a lot that we do not know about the virus that causes coronavirus disease. Scientists are basing  a lot of information on what they know about similar viruses, such as:  Viruses cannot generally survive on surfaces for long. They need a human body (host) to survive.  It is more likely that the virus is spread by close contact with people who are sick (direct contact), such as through: ? Shaking hands or hugging. ? Breathing in respiratory droplets that travel through the air. Droplets can be spread by a person breathing, speaking, singing, coughing, or sneezing.  It is less likely that the virus is spread when a person touches a surface or object that has the virus on it (indirect contact). The virus may be able to enter the body if the person touches a surface or object and then touches his or her face, eyes, nose, or mouth. Can a person spread the virus without having symptoms of the disease? It may be possible for the virus to spread before a person has symptoms of the disease, but this is most likely  not the main way the virus is spreading. It is more likely for the virus to spread by being in close contact with people who are sick and breathing in the respiratory droplets spread by a person breathing, speaking, singing, coughing, or sneezing. What are the symptoms of coronavirus disease? Symptoms vary from person to person and can range from mild to severe. Symptoms may include:  Fever or chills.  Cough.  Difficulty breathing or feeling short of breath.  Headaches, body aches, or muscle aches.  Runny or stuffy (congested) nose.  Sore throat.  New loss of taste or smell.  Nausea, vomiting, or diarrhea. These symptoms can appear anywhere from 2 to 14 days after you have been exposed to the virus. Some people may not have any symptoms. If you develop symptoms, call your health care provider. People with severe symptoms may need hospital care. Should I be tested for this virus? Your health care provider will decide whether to test you based on your symptoms, history of exposure,  and your risk factors. How does a health care provider test for this virus? Health care providers will collect samples to send for testing. Samples may include:  Taking a swab of fluid from the back of your nose and throat, your nose, or your throat.  Taking fluid from the lungs by having you cough up mucus (sputum) into a sterile cup.  Taking a blood sample. Is there a treatment or vaccine for this virus? Currently, there is no vaccine to prevent coronavirus disease. Also, there are no medicines like antibiotics or antivirals to treat the virus. A person who becomes sick is given supportive care, which means rest and fluids. A person may also relieve his or her symptoms by using over-the-counter medicines that treat sneezing, coughing, and runny nose. These are the same medicines that a person takes for the common cold. If you develop symptoms, call your health care provider. People with severe symptoms may need hospital care. What can I do to protect myself and my family from this virus?     You can protect yourself and your family by taking the same actions that you would take to prevent the spread of other viruses. Take the following actions:  Wash your hands often with soap and water for at least 20 seconds. If soap and water are not available, use alcohol-based hand sanitizer.  Avoid touching your face, mouth, nose, or eyes.  Cough or sneeze into a tissue, sleeve, or elbow. Do not cough or sneeze into your hand or the air. ? If you cough or sneeze into a tissue, throw it away immediately and wash your hands.  Disinfect objects and surfaces that you frequently touch every day.  Stay away from people who are sick.  Avoid going out in public, follow guidance from your state and local health authorities.  Avoid crowded indoor spaces. Stay at least 6 ft (2 m) away from others.  If you must go out in public, wear a cloth face covering or face mask. Make sure your mask covers your  nose and mouth.  Stay home if you are sick, except to get medical care. Call your health care provider before you get medical care. Your health care provider will tell you how long to stay home.  Make sure your vaccines are up to date. Ask your health care provider what vaccines you need. What should I do if I need to travel? Follow travel recommendations from your local health authority, the CDC,  and WHO. Travel information and advice  Centers for Disease Control and Prevention (CDC): BodyEditor.hu  World Health Organization [facility]): ThirdIncome.ca Know the risks and take action to protect your health  You are at higher risk of getting coronavirus disease if you are traveling to areas with an outbreak or if you are exposed to travelers from areas with an outbreak.  Wash your hands often and practice good hygiene to lower the risk of catching or spreading the virus. What should I do if I am sick? General instructions to stop the spread of infection  Wash your hands often with soap and water for at least 20 seconds. If soap and water are not available, use alcohol-based hand sanitizer.  Cough or sneeze into a tissue, sleeve, or elbow. Do not cough or sneeze into your hand or the air.  If you cough or sneeze into a tissue, throw it away immediately and wash your hands.  Stay home unless you must get medical care. Call your health care provider or local health authority before you get medical care.  Avoid public areas. Do not take public transportation, if possible.  If you can, wear a mask if you must go out of the house or if you are in close contact with someone who is not sick. Make sure your mask covers your nose and mouth. Keep your home clean  Disinfect objects and surfaces that are frequently touched every day. This may include: ? Counters and tables. ? Doorknobs and light  switches. ? Sinks and faucets. ? Electronics such as phones, remote controls, keyboards, computers, and tablets.  Wash dishes in hot, soapy water or use a dishwasher. Air-dry your dishes.  Wash laundry in hot water. Prevent infecting other household members  Let healthy household members care for children and pets, if possible. If you have to care for children or pets, wash your hands often and wear a mask.  Sleep in a different bedroom or bed, if possible.  Do not share personal items, such as razors, toothbrushes, deodorant, combs, brushes, towels, and washcloths. Where to find more information Centers for Disease Control and Prevention (CDC)  Information and news updates: https://www.butler-gonzalez.com/ World Health Organization [facility])  Information and news updates: MissExecutive.com.ee  Coronavirus health topic: https://www.castaneda.info/  Questions and answers on COVID-19: OpportunityDebt.at  Global tracker: who.sprinklr.com American Academy of Pediatrics (AAP)  Information for families: www.healthychildren.org/English/health-issues/conditions/chest-lungs/Pages/2019-Novel-Coronavirus.aspx The coronavirus situation is changing rapidly. Check your local health authority website or the CDC and [facility] websites for updates and news. When should I contact a health care provider?  Contact your health care provider if you have symptoms of an infection, such as fever or cough, and you: ? Have been near anyone who is known to have coronavirus disease. ? Have come into contact with a person who is suspected to have coronavirus disease. ? Have traveled to an area where there is an outbreak of COVID-19. When should I get emergency medical care?  Get help right away by calling your local emergency services (911 in the [place] if you have: ? Trouble breathing. ? Pain or pressure in your  chest. ? Confusion. ? Blue-tinged lips and fingernails. ? Difficulty waking from sleep. ? Symptoms that get worse. Let the emergency medical personnel know if you think you have coronavirus disease. Summary  A new respiratory virus is spreading from person to person and causing COVID-19 (coronavirus disease).  The virus that causes COVID-19 appears to spread easily. It spreads from one person to another through droplets  from breathing, speaking, singing, coughing, or sneezing.  Older adults and those with chronic diseases are at higher risk of disease. If you are at higher risk for complications, take extra precautions.  There is currently no vaccine to prevent coronavirus disease. There are no medicines, such as antibiotics or antivirals, to treat the virus.  You can protect yourself and your family by washing your hands often, avoiding touching your face, and covering your coughs and sneezes. This information is not intended to replace advice given to you by your health care provider. Make sure you discuss any questions you have with your health care provider. Document Revised: 01/09/2019 Document Reviewed: 07/08/2018 Elsevier Patient Education  2020 Elsevier Inc.  COVID-19: Quarantine vs. Isolation QUARANTINE keeps someone who was in close contact with someone who has COVID-19 away from others. If you had close contact with a person who has COVID-19  Stay home until 14 days after your last contact.  Check your temperature twice a day and watch for symptoms of COVID-19.  If possible, stay away from people who are at higher-risk for getting very sick from COVID-19. ISOLATION keeps someone who is sick or tested positive for COVID-19 without symptoms away from others, even in their own home. If you are sick and think or know you have COVID-19  Stay home until after ? At least 10 days since symptoms first appeared and ? At least 24 hours with no fever without fever-reducing  medication and ? Symptoms have improved If you tested positive for COVID-19 but do not have symptoms  Stay home until after ? 10 days have passed since your positive test If you live with others, stay in a specific "sick room" or area and away from other people or animals, including pets. Use a separate bathroom, if available. cdc.gov/coronavirus 10/13/2018 This information is not intended to replace advice given to you by your health care provider. Make sure you discuss any questions you have with your health care provider. Document Revised: 02/26/2019 Document Reviewed: 02/26/2019 Elsevier Patient Education  2020 Elsevier Inc.  

## 2019-07-18 NOTE — Progress Notes (Signed)
SATURATION QUALIFICATIONS: (This note is used to comply with regulatory documentation for home oxygen)  Patient Saturations on Room Air at Rest = 96%  Patient Saturations on Room Air while Ambulating = 91%  Patient Saturations on 0 Liters of oxygen while Ambulating = 91%  Please briefly explain why patient needs home oxygen: Home oxygen  not required.

## 2019-07-18 NOTE — TOC Transition Note (Signed)
Transition of Care [facility]) - CM/SW Discharge Note   Patient Details  Name: Paula Bird MRN: [id number] Date of Birth: Sep 04, 1925  [facility] CM/SW Contact:  Ida Rogue, LCSW Phone Number: 07/18/2019, 10:18 AM   Clinical Narrative:   Patient to transfer to [facility] today.  PTAR arranged.  Nursing, please call report to [phone], room 806A.  TOC sign off.     Final next level of care: Skilled Nursing Facility Barriers to Discharge: Barriers Resolved   Patient Goals and CMS Choice Patient states their goals for this hospitalization and ongoing recovery are:: Patiet plans to go to SNF for short term rehab, then return back to [facility] CMS Medicare.gov Compare Post Acute Care list provided to:: Patient Represenative (must comment) Choice offered to / list presented to : Adult Children  Discharge Placement                       Discharge Plan and Services     Post Acute Care Choice: Skilled Nursing Facility            DME Agency: NA       HH Arranged: NA          Social Determinants of Health (SDOH) Interventions     Readmission Risk Interventions No flowsheet data found.

## 2019-07-19 LAB — CULTURE, BLOOD (ROUTINE X 2)
Culture: NO GROWTH
Culture: NO GROWTH
Special Requests: ADEQUATE

## 2019-07-21 ENCOUNTER — Inpatient Hospital Stay (HOSPITAL_COMMUNITY)
Admission: EM | Admit: 2019-07-21 | Discharge: 2019-08-25 | DRG: 871 | Disposition: E | Payer: Medicare Other | Source: Skilled Nursing Facility | Attending: Family Medicine | Admitting: Family Medicine

## 2019-07-21 ENCOUNTER — Other Ambulatory Visit: Payer: Self-pay

## 2019-07-21 DIAGNOSIS — B948 Sequelae of other specified infectious and parasitic diseases: Secondary | ICD-10-CM

## 2019-07-21 DIAGNOSIS — R0902 Hypoxemia: Secondary | ICD-10-CM | POA: Diagnosis not present

## 2019-07-21 DIAGNOSIS — J441 Chronic obstructive pulmonary disease with (acute) exacerbation: Secondary | ICD-10-CM | POA: Diagnosis present

## 2019-07-21 DIAGNOSIS — Z66 Do not resuscitate: Secondary | ICD-10-CM | POA: Diagnosis present

## 2019-07-21 DIAGNOSIS — Z96652 Presence of left artificial knee joint: Secondary | ICD-10-CM | POA: Diagnosis present

## 2019-07-21 DIAGNOSIS — D509 Iron deficiency anemia, unspecified: Secondary | ICD-10-CM | POA: Diagnosis present

## 2019-07-21 DIAGNOSIS — J9601 Acute respiratory failure with hypoxia: Secondary | ICD-10-CM | POA: Diagnosis present

## 2019-07-21 DIAGNOSIS — I129 Hypertensive chronic kidney disease with stage 1 through stage 4 chronic kidney disease, or unspecified chronic kidney disease: Secondary | ICD-10-CM | POA: Diagnosis present

## 2019-07-21 DIAGNOSIS — E039 Hypothyroidism, unspecified: Secondary | ICD-10-CM | POA: Diagnosis present

## 2019-07-21 DIAGNOSIS — I480 Paroxysmal atrial fibrillation: Secondary | ICD-10-CM | POA: Diagnosis present

## 2019-07-21 DIAGNOSIS — A411 Sepsis due to other specified staphylococcus: Secondary | ICD-10-CM | POA: Diagnosis not present

## 2019-07-21 DIAGNOSIS — J440 Chronic obstructive pulmonary disease with acute lower respiratory infection: Secondary | ICD-10-CM | POA: Diagnosis present

## 2019-07-21 DIAGNOSIS — E669 Obesity, unspecified: Secondary | ICD-10-CM | POA: Diagnosis present

## 2019-07-21 DIAGNOSIS — J154 Pneumonia due to other streptococci: Secondary | ICD-10-CM | POA: Diagnosis present

## 2019-07-21 DIAGNOSIS — J1282 Pneumonia due to coronavirus disease 2019: Secondary | ICD-10-CM

## 2019-07-21 DIAGNOSIS — Z79899 Other long term (current) drug therapy: Secondary | ICD-10-CM

## 2019-07-21 DIAGNOSIS — I272 Pulmonary hypertension, unspecified: Secondary | ICD-10-CM | POA: Diagnosis present

## 2019-07-21 DIAGNOSIS — Z7952 Long term (current) use of systemic steroids: Secondary | ICD-10-CM

## 2019-07-21 DIAGNOSIS — K219 Gastro-esophageal reflux disease without esophagitis: Secondary | ICD-10-CM | POA: Diagnosis present

## 2019-07-21 DIAGNOSIS — I10 Essential (primary) hypertension: Secondary | ICD-10-CM | POA: Diagnosis present

## 2019-07-21 DIAGNOSIS — Z90710 Acquired absence of both cervix and uterus: Secondary | ICD-10-CM

## 2019-07-21 DIAGNOSIS — Z1623 Resistance to quinolones and fluoroquinolones: Secondary | ICD-10-CM | POA: Diagnosis present

## 2019-07-21 DIAGNOSIS — U071 COVID-19: Secondary | ICD-10-CM

## 2019-07-21 DIAGNOSIS — K589 Irritable bowel syndrome without diarrhea: Secondary | ICD-10-CM | POA: Diagnosis present

## 2019-07-21 DIAGNOSIS — J189 Pneumonia, unspecified organism: Secondary | ICD-10-CM | POA: Diagnosis present

## 2019-07-21 DIAGNOSIS — I482 Chronic atrial fibrillation, unspecified: Secondary | ICD-10-CM | POA: Diagnosis present

## 2019-07-21 DIAGNOSIS — E872 Acidosis: Secondary | ICD-10-CM | POA: Diagnosis present

## 2019-07-21 DIAGNOSIS — K224 Dyskinesia of esophagus: Secondary | ICD-10-CM | POA: Diagnosis present

## 2019-07-21 DIAGNOSIS — G7000 Myasthenia gravis without (acute) exacerbation: Secondary | ICD-10-CM | POA: Diagnosis present

## 2019-07-21 DIAGNOSIS — N182 Chronic kidney disease, stage 2 (mild): Secondary | ICD-10-CM | POA: Diagnosis present

## 2019-07-21 DIAGNOSIS — E876 Hypokalemia: Secondary | ICD-10-CM | POA: Diagnosis present

## 2019-07-21 DIAGNOSIS — E0590 Thyrotoxicosis, unspecified without thyrotoxic crisis or storm: Secondary | ICD-10-CM | POA: Diagnosis present

## 2019-07-21 DIAGNOSIS — J309 Allergic rhinitis, unspecified: Secondary | ICD-10-CM | POA: Diagnosis present

## 2019-07-21 DIAGNOSIS — G9341 Metabolic encephalopathy: Secondary | ICD-10-CM | POA: Diagnosis present

## 2019-07-21 DIAGNOSIS — Z6827 Body mass index (BMI) 27.0-27.9, adult: Secondary | ICD-10-CM

## 2019-07-21 DIAGNOSIS — R0603 Acute respiratory distress: Secondary | ICD-10-CM | POA: Diagnosis present

## 2019-07-21 DIAGNOSIS — Z8249 Family history of ischemic heart disease and other diseases of the circulatory system: Secondary | ICD-10-CM

## 2019-07-21 DIAGNOSIS — G4700 Insomnia, unspecified: Secondary | ICD-10-CM | POA: Diagnosis present

## 2019-07-21 DIAGNOSIS — Z515 Encounter for palliative care: Secondary | ICD-10-CM | POA: Diagnosis not present

## 2019-07-21 NOTE — ED Triage Notes (Signed)
Patient from [facility] COVID + Unit with respiratory distress and AMS. Patient noted to be lethargic and per facility was in the 80s on RA. Patient arrives on 15L NRB at 92%. Per facility, patient is normally A&O x4 and talkative. Patient arrives lethargic and not answering a lot of questions.

## 2019-07-22 ENCOUNTER — Encounter (HOSPITAL_COMMUNITY): Payer: Self-pay | Admitting: Emergency Medicine

## 2019-07-22 ENCOUNTER — Emergency Department (HOSPITAL_COMMUNITY): Payer: Medicare Other

## 2019-07-22 ENCOUNTER — Telehealth: Payer: Self-pay

## 2019-07-22 DIAGNOSIS — Z1623 Resistance to quinolones and fluoroquinolones: Secondary | ICD-10-CM | POA: Diagnosis present

## 2019-07-22 DIAGNOSIS — J9601 Acute respiratory failure with hypoxia: Secondary | ICD-10-CM | POA: Diagnosis present

## 2019-07-22 DIAGNOSIS — I10 Essential (primary) hypertension: Secondary | ICD-10-CM

## 2019-07-22 DIAGNOSIS — J309 Allergic rhinitis, unspecified: Secondary | ICD-10-CM | POA: Diagnosis present

## 2019-07-22 DIAGNOSIS — J440 Chronic obstructive pulmonary disease with acute lower respiratory infection: Secondary | ICD-10-CM | POA: Diagnosis present

## 2019-07-22 DIAGNOSIS — K224 Dyskinesia of esophagus: Secondary | ICD-10-CM | POA: Diagnosis present

## 2019-07-22 DIAGNOSIS — B948 Sequelae of other specified infectious and parasitic diseases: Secondary | ICD-10-CM | POA: Diagnosis not present

## 2019-07-22 DIAGNOSIS — U071 COVID-19: Secondary | ICD-10-CM | POA: Diagnosis not present

## 2019-07-22 DIAGNOSIS — G4700 Insomnia, unspecified: Secondary | ICD-10-CM | POA: Diagnosis present

## 2019-07-22 DIAGNOSIS — J1282 Pneumonia due to coronavirus disease 2019: Secondary | ICD-10-CM | POA: Diagnosis not present

## 2019-07-22 DIAGNOSIS — I480 Paroxysmal atrial fibrillation: Secondary | ICD-10-CM

## 2019-07-22 DIAGNOSIS — Z7189 Other specified counseling: Secondary | ICD-10-CM | POA: Diagnosis not present

## 2019-07-22 DIAGNOSIS — K589 Irritable bowel syndrome without diarrhea: Secondary | ICD-10-CM | POA: Diagnosis present

## 2019-07-22 DIAGNOSIS — I482 Chronic atrial fibrillation, unspecified: Secondary | ICD-10-CM | POA: Diagnosis present

## 2019-07-22 DIAGNOSIS — I272 Pulmonary hypertension, unspecified: Secondary | ICD-10-CM | POA: Diagnosis present

## 2019-07-22 DIAGNOSIS — Z66 Do not resuscitate: Secondary | ICD-10-CM | POA: Diagnosis present

## 2019-07-22 DIAGNOSIS — J154 Pneumonia due to other streptococci: Secondary | ICD-10-CM | POA: Diagnosis present

## 2019-07-22 DIAGNOSIS — G7000 Myasthenia gravis without (acute) exacerbation: Secondary | ICD-10-CM | POA: Diagnosis present

## 2019-07-22 DIAGNOSIS — D509 Iron deficiency anemia, unspecified: Secondary | ICD-10-CM | POA: Diagnosis present

## 2019-07-22 DIAGNOSIS — E039 Hypothyroidism, unspecified: Secondary | ICD-10-CM | POA: Diagnosis present

## 2019-07-22 DIAGNOSIS — A411 Sepsis due to other specified staphylococcus: Secondary | ICD-10-CM | POA: Diagnosis present

## 2019-07-22 DIAGNOSIS — N182 Chronic kidney disease, stage 2 (mild): Secondary | ICD-10-CM | POA: Diagnosis present

## 2019-07-22 DIAGNOSIS — J441 Chronic obstructive pulmonary disease with (acute) exacerbation: Secondary | ICD-10-CM

## 2019-07-22 DIAGNOSIS — J189 Pneumonia, unspecified organism: Secondary | ICD-10-CM

## 2019-07-22 DIAGNOSIS — Z515 Encounter for palliative care: Secondary | ICD-10-CM | POA: Diagnosis not present

## 2019-07-22 DIAGNOSIS — G9341 Metabolic encephalopathy: Secondary | ICD-10-CM | POA: Diagnosis present

## 2019-07-22 DIAGNOSIS — E0590 Thyrotoxicosis, unspecified without thyrotoxic crisis or storm: Secondary | ICD-10-CM

## 2019-07-22 DIAGNOSIS — I129 Hypertensive chronic kidney disease with stage 1 through stage 4 chronic kidney disease, or unspecified chronic kidney disease: Secondary | ICD-10-CM | POA: Diagnosis present

## 2019-07-22 DIAGNOSIS — K219 Gastro-esophageal reflux disease without esophagitis: Secondary | ICD-10-CM | POA: Diagnosis present

## 2019-07-22 DIAGNOSIS — E876 Hypokalemia: Secondary | ICD-10-CM | POA: Diagnosis present

## 2019-07-22 DIAGNOSIS — E872 Acidosis: Secondary | ICD-10-CM | POA: Diagnosis present

## 2019-07-22 DIAGNOSIS — R0902 Hypoxemia: Secondary | ICD-10-CM | POA: Diagnosis present

## 2019-07-22 LAB — C-REACTIVE PROTEIN: CRP: 32.9 mg/dL — ABNORMAL HIGH (ref <1.0)

## 2019-07-22 LAB — CBC WITH DIFFERENTIAL/PLATELET
Abs Immature Granulocytes: 0.14 10*3/uL — ABNORMAL HIGH (ref 0.00–0.07)
Basophils Absolute: 0 10*3/uL (ref 0.0–0.1)
Basophils Relative: 0 %
Eosinophils Absolute: 0 10*3/uL (ref 0.0–0.5)
Eosinophils Relative: 0 %
HCT: 47.2 % — ABNORMAL HIGH (ref 36.0–46.0)
Hemoglobin: 14.6 g/dL (ref 12.0–15.0)
Immature Granulocytes: 1 %
Lymphocytes Relative: 4 %
Lymphs Abs: 0.7 10*3/uL (ref 0.7–4.0)
MCH: 28.3 pg (ref 26.0–34.0)
MCHC: 30.9 g/dL (ref 30.0–36.0)
MCV: 91.5 fL (ref 80.0–100.0)
Monocytes Absolute: 1.3 10*3/uL — ABNORMAL HIGH (ref 0.1–1.0)
Monocytes Relative: 8 %
Neutro Abs: 15 10*3/uL — ABNORMAL HIGH (ref 1.7–7.7)
Neutrophils Relative %: 87 %
Platelets: 144 10*3/uL — ABNORMAL LOW (ref 150–400)
RBC: 5.16 MIL/uL — ABNORMAL HIGH (ref 3.87–5.11)
RDW: 15.3 % (ref 11.5–15.5)
WBC: 17.1 10*3/uL — ABNORMAL HIGH (ref 4.0–10.5)
nRBC: 0 % (ref 0.0–0.2)

## 2019-07-22 LAB — COMPREHENSIVE METABOLIC PANEL
ALT: 40 U/L (ref 0–44)
AST: 16 U/L (ref 15–41)
Albumin: 3.1 g/dL — ABNORMAL LOW (ref 3.5–5.0)
Alkaline Phosphatase: 64 U/L (ref 38–126)
Anion gap: 11 (ref 5–15)
BUN: 20 mg/dL (ref 8–23)
CO2: 25 mmol/L (ref 22–32)
Calcium: 8.2 mg/dL — ABNORMAL LOW (ref 8.9–10.3)
Chloride: 100 mmol/L (ref 98–111)
Creatinine, Ser: 0.53 mg/dL (ref 0.44–1.00)
GFR calc Af Amer: >60 mL/min (ref >60)
GFR calc non Af Amer: >60 mL/min (ref >60)
Glucose, Bld: 118 mg/dL — ABNORMAL HIGH (ref 70–99)
Potassium: 3.3 mmol/L — ABNORMAL LOW (ref 3.5–5.1)
Sodium: 136 mmol/L (ref 135–145)
Total Bilirubin: 0.9 mg/dL (ref 0.3–1.2)
Total Protein: 6.5 g/dL (ref 6.5–8.1)

## 2019-07-22 LAB — BLOOD GAS, ARTERIAL
Acid-Base Excess: 3.7 mmol/L — ABNORMAL HIGH (ref 0.0–2.0)
Bicarbonate: 28.1 mmol/L — ABNORMAL HIGH (ref 20.0–28.0)
Drawn by: 225631
FIO2: 100
O2 Content: 15 L/min
O2 Saturation: 88.9 %
Patient temperature: 98.9
pCO2 arterial: 43.6 mmHg (ref 32.0–48.0)
pH, Arterial: 7.426 (ref 7.350–7.450)
pO2, Arterial: 57.1 mmHg — ABNORMAL LOW (ref 83.0–108.0)

## 2019-07-22 LAB — URINALYSIS, ROUTINE W REFLEX MICROSCOPIC
Bilirubin Urine: NEGATIVE
Glucose, UA: NEGATIVE mg/dL
Hgb urine dipstick: NEGATIVE
Ketones, ur: 5 mg/dL — AB
Leukocytes,Ua: NEGATIVE
Nitrite: NEGATIVE
Protein, ur: 30 mg/dL — AB
Specific Gravity, Urine: 1.04 — ABNORMAL HIGH (ref 1.005–1.030)
pH: 5 (ref 5.0–8.0)

## 2019-07-22 LAB — PROCALCITONIN: Procalcitonin: 0.55 ng/mL

## 2019-07-22 LAB — FERRITIN: Ferritin: 298 ng/mL (ref 11–307)

## 2019-07-22 LAB — LACTIC ACID, PLASMA: Lactic Acid, Venous: 2.5 mmol/L (ref 0.5–1.9)

## 2019-07-22 MED ORDER — SODIUM CHLORIDE 0.9 % IV SOLN
2.0000 g | Freq: Two times a day (BID) | INTRAVENOUS | Status: DC
  Administered 2019-07-22 – 2019-07-23 (×3): 2 g via INTRAVENOUS
  Filled 2019-07-22 (×4): qty 2

## 2019-07-22 MED ORDER — ACETAMINOPHEN 650 MG RE SUPP: 650.0000 mg | Freq: Four times a day (QID) | RECTAL | Status: DC | PRN

## 2019-07-22 MED ORDER — POLYETHYLENE GLYCOL 3350 17 G PO PACK
17.0000 g | PACK | Freq: Every day | ORAL | Status: DC
  Administered 2019-07-22 – 2019-07-23 (×2): 17 g via ORAL
  Filled 2019-07-22 (×2): qty 1

## 2019-07-22 MED ORDER — GUAIFENESIN-DM 100-10 MG/5ML PO SYRP
10.0000 mL | ORAL_SOLUTION | ORAL | Status: DC | PRN
  Administered 2019-07-22: 16:00:00 10 mL via ORAL
  Filled 2019-07-22: qty 10

## 2019-07-22 MED ORDER — POTASSIUM CHLORIDE 20 MEQ/15ML (10%) PO SOLN
40.0000 meq | Freq: Once | ORAL | Status: DC
  Filled 2019-07-22: qty 30

## 2019-07-22 MED ORDER — AMIODARONE HCL 200 MG PO TABS
200.0000 mg | ORAL_TABLET | Freq: Every day | ORAL | Status: DC
  Administered 2019-07-22 – 2019-07-23 (×2): 200 mg via ORAL
  Filled 2019-07-22 (×3): qty 1

## 2019-07-22 MED ORDER — SUCRALFATE 1 G PO TABS
1.0000 g | ORAL_TABLET | Freq: Four times a day (QID) | ORAL | Status: DC
  Administered 2019-07-22 – 2019-07-23 (×3): 1 g via ORAL
  Filled 2019-07-22 (×3): qty 1

## 2019-07-22 MED ORDER — POTASSIUM CHLORIDE CRYS ER 20 MEQ PO TBCR: 40.0000 meq | EXTENDED_RELEASE_TABLET | Freq: Once | ORAL | Status: DC

## 2019-07-22 MED ORDER — ONDANSETRON HCL 4 MG PO TABS: 8.0000 mg | ORAL_TABLET | Freq: Three times a day (TID) | ORAL | Status: DC | PRN

## 2019-07-22 MED ORDER — SENNA 8.6 MG PO TABS
2.0000 | ORAL_TABLET | Freq: Every day | ORAL | Status: DC
  Administered 2019-07-22 – 2019-07-23 (×2): 17.2 mg via ORAL
  Filled 2019-07-22 (×2): qty 2

## 2019-07-22 MED ORDER — METHYLPREDNISOLONE SODIUM SUCC 40 MG IJ SOLR
40.0000 mg | Freq: Two times a day (BID) | INTRAMUSCULAR | Status: DC
  Administered 2019-07-22 – 2019-07-23 (×3): 40 mg via INTRAVENOUS
  Filled 2019-07-22 (×4): qty 1

## 2019-07-22 MED ORDER — PANTOPRAZOLE SODIUM 40 MG PO TBEC
40.0000 mg | DELAYED_RELEASE_TABLET | Freq: Every day | ORAL | Status: DC
  Administered 2019-07-22 – 2019-07-23 (×2): 40 mg via ORAL
  Filled 2019-07-22 (×2): qty 1

## 2019-07-22 MED ORDER — MAGNESIUM CITRATE PO SOLN
1.0000 | Freq: Every day | ORAL | Status: DC | PRN
  Filled 2019-07-22: qty 296

## 2019-07-22 MED ORDER — SODIUM CHLORIDE 0.9 % IV SOLN: 2.0000 g | Freq: Three times a day (TID) | INTRAVENOUS | Status: DC

## 2019-07-22 MED ORDER — POTASSIUM CHLORIDE 10 MEQ/100ML IV SOLN
10.0000 meq | INTRAVENOUS | Status: AC
  Administered 2019-07-22 (×4): 10 meq via INTRAVENOUS
  Filled 2019-07-22 (×4): qty 100

## 2019-07-22 MED ORDER — SUMATRIPTAN SUCCINATE 50 MG PO TABS
50.0000 mg | ORAL_TABLET | Freq: Every day | ORAL | Status: DC | PRN
  Filled 2019-07-22: qty 1

## 2019-07-22 MED ORDER — LINACLOTIDE 145 MCG PO CAPS
145.0000 ug | ORAL_CAPSULE | Freq: Every day | ORAL | Status: DC
  Administered 2019-07-22 – 2019-07-23 (×2): 145 ug via ORAL
  Filled 2019-07-22 (×3): qty 1

## 2019-07-22 MED ORDER — VANCOMYCIN HCL 500 MG/100ML IV SOLN
500.0000 mg | INTRAVENOUS | Status: DC
  Administered 2019-07-23: 06:00:00 500 mg via INTRAVENOUS
  Filled 2019-07-22: qty 100

## 2019-07-22 MED ORDER — VANCOMYCIN HCL 1250 MG/250ML IV SOLN
1250.0000 mg | Freq: Once | INTRAVENOUS | Status: AC
  Administered 2019-07-22: 1250 mg via INTRAVENOUS
  Filled 2019-07-22: qty 250

## 2019-07-22 MED ORDER — ENOXAPARIN SODIUM 40 MG/0.4ML ~~LOC~~ SOLN
40.0000 mg | SUBCUTANEOUS | Status: DC
  Administered 2019-07-22: 40 mg via SUBCUTANEOUS
  Filled 2019-07-22 (×2): qty 0.4

## 2019-07-22 MED ORDER — UMECLIDINIUM BROMIDE 62.5 MCG/INH IN AEPB
1.0000 | INHALATION_SPRAY | Freq: Every day | RESPIRATORY_TRACT | Status: DC
  Administered 2019-07-22 – 2019-07-24 (×3): 1 via RESPIRATORY_TRACT
  Filled 2019-07-22 (×2): qty 7

## 2019-07-22 MED ORDER — POLYVINYL ALCOHOL 1.4 % OP SOLN
1.0000 [drp] | Freq: Three times a day (TID) | OPHTHALMIC | Status: DC
  Administered 2019-07-22 – 2019-07-26 (×8): 1 [drp] via OPHTHALMIC
  Filled 2019-07-22 (×2): qty 15

## 2019-07-22 MED ORDER — FLUTICASONE FUROATE-VILANTEROL 100-25 MCG/INH IN AEPB
1.0000 | INHALATION_SPRAY | Freq: Every day | RESPIRATORY_TRACT | Status: DC
  Administered 2019-07-22 – 2019-07-23 (×2): 1 via RESPIRATORY_TRACT
  Filled 2019-07-22 (×2): qty 28

## 2019-07-22 MED ORDER — MYCOPHENOLATE MOFETIL 250 MG PO CAPS
500.0000 mg | ORAL_CAPSULE | Freq: Two times a day (BID) | ORAL | Status: DC
  Administered 2019-07-22 – 2019-07-23 (×3): 500 mg via ORAL
  Filled 2019-07-22 (×6): qty 2

## 2019-07-22 MED ORDER — BISACODYL 10 MG RE SUPP: 10.0000 mg | Freq: Every day | RECTAL | Status: DC | PRN

## 2019-07-22 MED ORDER — ACETAMINOPHEN 325 MG PO TABS: 650.0000 mg | ORAL_TABLET | Freq: Four times a day (QID) | ORAL | Status: DC | PRN

## 2019-07-22 MED ORDER — HYDROCODONE-ACETAMINOPHEN 5-325 MG PO TABS
1.0000 | ORAL_TABLET | ORAL | Status: DC | PRN
  Administered 2019-07-22: 1 via ORAL
  Filled 2019-07-22: qty 1

## 2019-07-22 MED ORDER — DILTIAZEM HCL ER COATED BEADS 300 MG PO CP24
300.0000 mg | ORAL_CAPSULE | Freq: Every day | ORAL | Status: DC
  Administered 2019-07-22 – 2019-07-23 (×2): 300 mg via ORAL
  Filled 2019-07-22 (×3): qty 1

## 2019-07-22 MED ORDER — LACTATED RINGERS IV SOLN: INTRAVENOUS | Status: DC

## 2019-07-22 MED ORDER — ENSURE ENLIVE PO LIQD
237.0000 mL | Freq: Two times a day (BID) | ORAL | Status: DC
  Administered 2019-07-22 – 2019-07-23 (×3): 237 mL via ORAL
  Filled 2019-07-22: qty 237

## 2019-07-22 MED ORDER — IPRATROPIUM-ALBUTEROL 20-100 MCG/ACT IN AERS
1.0000 | INHALATION_SPRAY | Freq: Four times a day (QID) | RESPIRATORY_TRACT | Status: DC | PRN
  Administered 2019-07-23: 15:00:00 1 via RESPIRATORY_TRACT
  Filled 2019-07-22 (×2): qty 4

## 2019-07-22 MED ORDER — METHIMAZOLE 10 MG PO TABS
10.0000 mg | ORAL_TABLET | Freq: Every day | ORAL | Status: DC
  Administered 2019-07-23: 10 mg via ORAL
  Filled 2019-07-22 (×2): qty 1

## 2019-07-22 NOTE — ED Notes (Signed)
Family is at bedside wearing full PPE.

## 2019-07-22 NOTE — Plan of Care (Signed)

## 2019-07-22 NOTE — H&P (Addendum)
[facility] History and Physical  Paula Bird OJJ:[id number] DOB: September 24, 1925 DOA: 2019-07-25  Referring physician: ED  PCP: Angela Cox, MD   Patient is coming from: Skilled nursing facility  Chief Complaint: Hypoxia  HPI: Paula Bird is a 86 y.o. female patient with recent diagnosis of Covid pneumonia,  hypertension, atrial fibrillation, chronic kidney disease, COPD not on home oxygen, GERD, hypothyroidism, iron deficiency anemia, irritable bowel syndrome, depression and impaired mobility currently at skilled nursing facility presented to the hospital with complaints of shortness of breath and low oxygen saturation at the facility.  History was limited since patient was sent from the skilled nursing facility.  There was also mention of some altered mental status.  The nurse at the skilled nursing facility noted that she was altered and hypoxic and was placed on oxygen.  Of note, patient was recently discharged from our hospital on 07/18/2019 after treatment for Covid pneumonia.  She was weaned off oxygen prior to discharge and had received remdesivir and Solu-Medrol at that time for 4 days.  At the time of my evaluation, patient denies any nausea vomiting abdominal pain.  Feels thirsty.  Denies any chest pain palpitation.  Denies fever chills.  ED Course: In the ED, WBC was elevated at 17.1.  Potassium 3.3.  Lactate was elevated at 2.5.  Patient was put on nonrebreather mask.  X-ray done in the ED showed worsening atelectasis and infiltrates.  Blood cultures were sent from the ED.  In room air, her pulse ox was 70% in the ED. Patient was then considered for admission to the hospital.  Review of Systems:  All systems were reviewed and were negative unless otherwise mentioned in the HPI  Past Medical History:  Diagnosis Date  . Acute and chronic respiratory failure, unspecified whether with hypoxia or hypercapnia (HCC)   . Afib (HCC)   . Allergic rhinitis   . Anemia   .  CKD (chronic kidney disease)   . Constipation   . COPD (chronic obstructive pulmonary disease) (HCC)   . Depression   . Diplopia   . Dysphagia   . Edema, lower extremity   . Esophageal dysmotility   . Gait instability   . GERD (gastroesophageal reflux disease)   . Hypertension   . Hypothyroidism   . IBS (irritable bowel syndrome)   . IDA (iron deficiency anemia)   . Insomnia   . Leukocytosis   . Myasthenia gravis (HCC)   . Oral thrush   . PAF (paroxysmal atrial fibrillation) (HCC)   . Physical deconditioning   . Pneumonia 02/2017  . Protein calorie malnutrition (HCC)   . Thyroid disease   . Thyroid mass   . Vertigo    Past Surgical History:  Procedure Laterality Date  . ABDOMINAL HYSTERECTOMY    . APPENDECTOMY    . ESOPHAGOGASTRODUODENOSCOPY (EGD) WITH PROPOFOL N/A 07/21/2017   Procedure: ESOPHAGOGASTRODUODENOSCOPY (EGD) WITH PROPOFOL;  Surgeon: Kerin Salen, MD;  Location: [facility];  Service: Gastroenterology;  Laterality: N/A;  . TOTAL HIP ARTHROPLASTY Left   . TOTAL KNEE ARTHROPLASTY Left     Social History:  reports that she has never smoked. She has never used smokeless tobacco. She reports that she does not drink alcohol or use drugs.  No Known Allergies  Family History  Problem Relation Age of Onset  . Hypertension Mother   . Hypertension Son   . Diabetes Neg Hx   . Thyroid disease Neg Hx      Prior to Admission medications  Medication Sig Start Date End Date Taking? Authorizing Provider  amiodarone (PACERONE) 200 MG tablet Take 200 mg by mouth daily.  06/07/17  Yes [provider]  amitriptyline (ELAVIL) 50 MG tablet Take 100 mg by mouth at bedtime. 2 tabs (100mg ) at hs 06/22/17  Yes [provider]  bisacodyl (DULCOLAX) 10 MG suppository Place 10 mg rectally daily as needed for moderate constipation.   Yes [provider]  BREO ELLIPTA 100-25 MCG/INH AEPB Take 1 puff by mouth daily. 06/16/18  Yes [provider]    diltiazem (CARDIZEM LA) 300 MG 24 hr tablet Take 300 mg by mouth daily. 03/04/16  Yes [provider]  esomeprazole (NEXIUM) 40 MG capsule Take 40 mg by mouth daily. 06/11/18  Yes [provider]  ferrous sulfate 325 (65 FE) MG tablet Take 325 mg by mouth daily with breakfast.   Yes [provider]  guaiFENesin-dextromethorphan (ROBITUSSIN DM) 100-10 MG/5ML syrup Take 10 mLs by mouth every 4 (four) hours as needed for cough. 07/18/19  Yes Danford, 07/20/19, MD  HYDROcodone-acetaminophen (NORCO/VICODIN) 5-325 MG tablet Take 1 tablet by mouth every 4 (four) hours as needed for moderate pain. 07/18/19  Yes Danford, 07/20/19, MD  INCRUSE ELLIPTA 62.5 MCG/INH AEPB Take 1 puff by mouth daily. 06/10/17  Yes [provider]  linaclotide (LINZESS) 145 MCG CAPS capsule Take 145 mcg by mouth daily before breakfast.   Yes [provider]  magnesium citrate SOLN Take 1 Bottle by mouth daily as needed for moderate constipation. 06/12/17 q24h prn constipation    Yes [provider]  methimazole (TAPAZOLE) 10 MG tablet Take 10 mg by mouth daily.  06/16/18  Yes [provider]  Multiple Vitamin (MULTIVITAMIN WITH MINERALS) TABS tablet Take 1 tablet by mouth daily.   Yes [provider]  Multiple Vitamins-Minerals (MULTIVITAMIN ADULT PO) Take 2 capsules by mouth daily. [employer] AREDS 2 Cap 2 caps qd    Yes [provider]  mycophenolate (CELLCEPT) 500 MG tablet Take 1 tablet (500 mg total) by mouth 2 (two) times daily. 11/27/18  Yes 01/27/19, MD  ondansetron (ZOFRAN) 8 MG tablet Take 8 mg by mouth every 8 (eight) hours as needed for nausea or vomiting.   Yes [provider]  polyethylene glycol (MIRALAX / GLYCOLAX) packet Take 17 g by mouth daily. 10/22/17  Yes 10/24/17, Randel Pigg, MD  polyvinyl alcohol (ARTIFICIAL TEARS) 1.4 % ophthalmic solution Place 1 drop into both eyes 3 (three) times daily.   Yes [provider]  senna (SENOKOT) 8.6 MG tablet Take 2 tablets by mouth daily. senolot S 8.6-50mg  qd   Yes [provider]  sucralfate (CARAFATE) 1 g tablet Take 1 g by mouth 4 (four) times daily. 06/16/18  Yes [provider]  SUMAtriptan (IMITREX) 50 MG tablet Take 50 mg by mouth daily as needed for migraine. 1 tab every 24 hours as needed for migraine. Limit to 10 days /month to avoid medication overuse.   Yes [provider]  zolpidem (AMBIEN) 10 MG tablet Take 1 tablet (10 mg total) by mouth at bedtime. 07/18/19  Yes 07/20/19, MD    Physical Exam: Vitals:   07/22/19 07/24/19 07/22/19 0800 07/22/19 0900 07/22/19 1000  BP: 120/77 (!) 141/76 (!) 144/74 (!) 141/80  Pulse: 100 (!) 101 96 (!) 101  Resp: 17 (!) 26 (!) 26 (!) 26  SpO2: (!) 86% 97% 97% 96%   Wt Readings from Last 3 Encounters:  07/15/19 67 kg  06/26/19 67.6 kg  11/27/18 65.3 kg   There is no height or weight on file to calculate BMI.  General: Obese female, elderly chronically ill, on nonrebreather mask, mildly anxious HENT: Normocephalic, pupils equally reacting to light and accommodation.  No scleral pallor or icterus noted. Oral mucosa dry Chest: Diminished breath sounds bilaterally.  Coarse breath sounds noted.  Occasional crackles noted CVS: S1 &S2 heard. No murmur.  Regular rate and rhythm. Abdomen: Soft, nontender, nondistended.  Bowel sounds are heard.   Extremities: No cyanosis, clubbing or edema.  Peripheral pulses are palpable. Psych: Alert, awake and communicative,disoriented, CNS:  No cranial nerve deficits.  Moving all extremities Skin: Warm and dry.  No rashes noted.  Labs on Admission:   CBC: Recent Labs  Lab 07/16/19 0126 07/22/19 0106  WBC 7.5 17.1*  NEUTROABS  --  15.0*  HGB 15.1* 14.6  HCT 48.1* 47.2*  MCV 89.6 91.5  PLT 128* 144*    Basic Metabolic Panel: Recent Labs  Lab 07/16/19 0126 07/22/19 0106  NA 139 136  K 3.8 3.3*  CL 104 100  CO2 24 25   GLUCOSE 141* 118*  BUN 18 20  CREATININE 0.62 0.53  CALCIUM 8.8* 8.2*    Liver Function Tests: Recent Labs  Lab 07/16/19 0126 07/22/19 0106  AST 53* 16  ALT 87* 40  ALKPHOS 73 64  BILITOT 0.5 0.9  PROT 6.7 6.5  ALBUMIN 3.4* 3.1*   No results for input(s): LIPASE, AMYLASE in the last 168 hours. No results for input(s): AMMONIA in the last 168 hours.  Cardiac Enzymes: No results for input(s): CKTOTAL, CKMB, CKMBINDEX, TROPONINI in the last 168 hours.  BNP (last 3 results) No results for input(s): BNP in the last 8760 hours.  ProBNP (last 3 results) No results for input(s): PROBNP in the last 8760 hours.  CBG: Recent Labs  Lab 07/17/19 1149 07/17/19 1556 07/17/19 2108 07/18/19 0813 07/18/19 1200  GLUCAP 203* 196* 114* 149* 183*    Urinalysis    Component Value Date/Time   COLORURINE AMBER (A) 07/22/2019 0846   APPEARANCEUR CLOUDY (A) 07/22/2019 0846   LABSPEC 1.040 (H) 07/22/2019 0846   PHURINE 5.0 07/22/2019 0846   GLUCOSEU NEGATIVE 07/22/2019 0846   HGBUR NEGATIVE 07/22/2019 0846   BILIRUBINUR NEGATIVE 07/22/2019 0846   KETONESUR 5 (A) 07/22/2019 0846   PROTEINUR 30 (A) 07/22/2019 0846   UROBILINOGEN 0.2 08/13/2014 2256   NITRITE NEGATIVE 07/22/2019 0846   LEUKOCYTESUR NEGATIVE 07/22/2019 0846     Drugs of Abuse  No results found for: LABOPIA, COCAINSCRNUR, LABBENZ, AMPHETMU, THCU, LABBARB    Radiological Exams on Admission: DG Chest Port 1 View  Result Date: 07/22/2019 CLINICAL DATA:  Cough and congestion. EXAM: PORTABLE CHEST 1 VIEW COMPARISON:  July 14, 2019 FINDINGS: Moderate to marked severity areas of atelectasis and/or infiltrate are seen within the bilateral lung bases, right greater than left. There are small bilateral pleural effusions. No pneumothorax is identified. The cardiac silhouette is mildly enlarged and unchanged in size. There is moderate severity calcification of the thoracic aorta. Degenerative changes seen throughout the  thoracic spine. IMPRESSION: 1. Moderate to marked severity bibasilar atelectasis and/or infiltrate, right greater than left. 2. Small bilateral pleural effusions. Electronically Signed   By: Virgina Norfolk M.D.   On: 07/22/2019 01:56    EKG: Personally reviewed by me which shows controlled atrial fibrillation  Assessment/Plan Principal Problem:   Acute hypoxemic respiratory failure (HCC) Active Problems:   Hypertension  Obstructive chronic bronchitis with acute exacerbation (HCC)   Paroxysmal atrial fibrillation (HCC)   Respiratory distress   Hyperthyroidism   COVID-19 virus infection   Pneumonia   Acute hypoxic respiratory failure.  Patient was recently diagnosed with Covid pneumonia.  Was treated with steroids and remdesivir for 4 days while in hospital.  Likely post Covid bacterial pneumonia at this time.  We will continue with the vancomycin cefepime.  Continue IV steroids for now.  On nonrebreather mask.  Follow blood culture sent from the ED.  ABG showed hypoxia with 57% oxygen saturation on oxygen on 15 L of nonrebreather mask at 100%.   Check procalcitonin.  Overall prognosis for the patient is poor.  Will consult palliative care.  Altered mental status likely metabolic encephalopathy.  Hold sedatives for now.  Was on Elavil, zolpidem at home.  Will hold for now.  Mild hypokalemia.  Given replacement in the ED.  Check BMP in a.m.  Elevated lactate at 2.5, Leukocytosis.  Likely secondary to respiratory distress.  Leukocytosis likely from pneumonia and steroids.  We will close monitor.  Will avoid excessive IV fluids due to underlying Covid pneumonia.  Lactate in a.m.  History of COPD with hypoxic respiratory failure.  Continue oxygen, inhalers, IV steroids.  Continue broad-spectrum antibiotic.  History of chronic atrial fibrillation.  Closely monitor. Blood pressure is stable.  Continue amiodarone and Cardizem  History of myasthenia gravis.  On mycophenolate.  Chronic  kidney disease.  Check BMP closely.  Hyperthyroidism.  On methimazole.  History of depression anxiety.  Hold sedatives for now.  History of GERD.  Protonix  DVT Prophylaxis: Lovenox subcu  Consultant: Palliative care  Code Status: DNR  Microbiology blood cultures  Antibiotics: Vancomycin and cefepime.  Family Communication:  Patients' condition and plan of care including tests being ordered have been discussed with the patient and the patient's son who indicate understanding and agree with the plan.  I had a prolonged discussion with the patient's son who is is aware that the prognosis is poor.  He wishes his mom to be comfortable and is appreciative of what we have been doing to help her.  Disposition Plan: Skilled nursing facility  Severity of Illness: The appropriate patient status for this patient is INPATIENT. Inpatient status is judged to be reasonable and necessary in order to provide the required intensity of service to ensure the patient's safety. The patient's presenting symptoms, physical exam findings, and initial radiographic and laboratory data in the context of their chronic comorbidities is felt to place them at high risk for further clinical deterioration. Furthermore, it is not anticipated that the patient will be medically stable for discharge from the hospital within 2 midnights of admission. The following factors support the patient status of inpatient.  I certify that at the point of admission it is my clinical judgment that the patient will require inpatient hospital care spanning beyond 2 midnights from the point of admission due to high intensity of service, high risk for further deterioration and high frequency of surveillance required.   Signed, Joycelyn Das, MD [facility] 07/22/2019

## 2019-07-22 NOTE — Telephone Encounter (Signed)
Received notification that patient is currently in ED @ [facility]. Hospital liaison and primary Palliative NP updated.

## 2019-07-22 NOTE — ED Notes (Signed)
Purewick placed on patient for urine sample. °

## 2019-07-22 NOTE — ED Notes (Signed)
Respiratory at bedside for ABG

## 2019-07-22 NOTE — Progress Notes (Signed)
Pharmacy Antibiotic Note  Paula Bird is a 86 y.o. female admitted on 06/25/2019 with pneumonia.  Pharmacy has been consulted for vancomycin dosing along with cefepime per MD.  Plan: Vancomycin 1250 mg iv once followed by vancomycin 500 mg IV Q 24 hrs. Goal AUC 400-550. Expected AUC: 471 SCr used: 0.8  - F/U renal function, MRSA PCR, and culture results - Levels as indicated      No data recorded.  Recent Labs  Lab 07/16/19 0126 07/22/19 0106  WBC 7.5 17.1*  CREATININE 0.62 0.53    Estimated Creatinine Clearance: 39.1 mL/min (by C-G formula based on SCr of 0.53 mg/dL).    No Known Allergies   Thank you for allowing pharmacy to be a part of this patient's care.  Valentina Gu 07/22/2019 4:33 AM

## 2019-07-22 NOTE — ED Provider Notes (Signed)
[facility]-EMERGENCY DEPT Provider Note   CSN: [id number] Arrival date & time: 02-26-20  2328     History Chief Complaint  Patient presents with  . Respiratory Distress  . Covid Exposure    COVID +   Level 5 caveat due to altered mental status Paula Bird is a 86 y.o. female.  The history is provided by the patient and the nursing home. The history is limited by the condition of the patient.  Cough Severity:  Moderate Timing:  Intermittent Progression:  Worsening Chronicity:  Recurrent Relieved by:  Nothing Worsened by:  Nothing Patient presents from nursing facility for shortness of breath and altered mental status.  Patient with recent diagnosis of COVID-19.  Nurse found her and she appeared to be altered and was hypoxic.  She increased her oxygen from 2 L to 4 L with some improvement, but still appeared altered.    Son reports patient has had both Covid vaccines Past Medical History:  Diagnosis Date  . Acute and chronic respiratory failure, unspecified whether with hypoxia or hypercapnia (HCC)   . Afib (HCC)   . Allergic rhinitis   . Anemia   . CKD (chronic kidney disease)   . Constipation   . COPD (chronic obstructive pulmonary disease) (HCC)   . Depression   . Diplopia   . Dysphagia   . Edema, lower extremity   . Esophageal dysmotility   . Gait instability   . GERD (gastroesophageal reflux disease)   . Hypertension   . Hypothyroidism   . IBS (irritable bowel syndrome)   . IDA (iron deficiency anemia)   . Insomnia   . Leukocytosis   . Myasthenia gravis (HCC)   . Oral thrush   . PAF (paroxysmal atrial fibrillation) (HCC)   . Physical deconditioning   . Pneumonia 02/2017  . Protein calorie malnutrition (HCC)   . Thyroid disease   . Thyroid mass   . Vertigo     Patient Active Problem List   Diagnosis Date Noted  . COVID-19 virus infection 07/14/2019  . Closed fracture of left ankle   . Closed fracture of right ankle   .  Fibula fracture 10/19/2017  . Avulsion fracture of medial malleolus of left tibia 10/19/2017  . UTI (urinary tract infection) 07/17/2017  . Infection due to parainfluenza virus 4 03/06/2017  . Gait abnormality 06/07/2016  . Hyperthyroidism 04/24/2016  . Myasthenia gravis (HCC) 02/07/2016  . Ptosis of eyelid, right 01/31/2016  . Pain, eye, right 01/31/2016  . Falling 01/31/2016  . Vertigo 10/08/2015  . Colon polyp 08/30/2015  . Arthritis, degenerative 08/30/2015  . Acid indigestion 08/30/2015  . Psoriasis 08/30/2015  . Avitaminosis D 08/30/2015  . HCAP (healthcare-associated pneumonia) 08/08/2015  . Respiratory distress 08/08/2015  . Essential hypertension 08/08/2015  . COPD exacerbation (HCC) 08/08/2015  . Dyspnea 08/04/2015  . Hypertension 08/04/2015  . Depression 08/04/2015  . Insomnia 08/04/2015  . Obstructive chronic bronchitis with acute exacerbation (HCC) 08/04/2015  . Thrombocytopenia (HCC) 08/04/2015  . Paroxysmal atrial fibrillation (HCC) 08/04/2015  . Acute exacerbation of chronic obstructive bronchitis (HCC) 08/04/2015  . Infection of urinary tract 07/01/2014  . Bradycardia 11/24/2013  . Fall in home 11/22/2013  . Pulmonary hypertension (HCC) 11/19/2013  . Excessive falling 09/07/2012  . Encounter for general adult medical examination without abnormal findings 03/24/2012  . Atrial paroxysmal tachycardia (HCC) 10/05/2011  . Family history of colon cancer 05/15/2011  . Mass of pelvis 05/15/2011    Past Surgical History:  Procedure  Laterality Date  . ABDOMINAL HYSTERECTOMY    . APPENDECTOMY    . ESOPHAGOGASTRODUODENOSCOPY (EGD) WITH PROPOFOL N/A 07/21/2017   Procedure: ESOPHAGOGASTRODUODENOSCOPY (EGD) WITH PROPOFOL;  Surgeon: Kerin Salen, MD;  Location: [facility];  Service: Gastroenterology;  Laterality: N/A;  . TOTAL HIP ARTHROPLASTY Left   . TOTAL KNEE ARTHROPLASTY Left      OB History   No obstetric history on file.     Family History  Problem  Relation Age of Onset  . Hypertension Mother   . Hypertension Son   . Diabetes Neg Hx   . Thyroid disease Neg Hx     Social History   Tobacco Use  . Smoking status: Never Smoker  . Smokeless tobacco: Never Used  Substance Use Topics  . Alcohol use: No  . Drug use: No    Home Medications Prior to Admission medications   Medication Sig Start Date End Date Taking? Authorizing Provider  amiodarone (PACERONE) 200 MG tablet Take 200 mg by mouth daily.  06/07/17  Yes [provider]  amitriptyline (ELAVIL) 50 MG tablet Take 100 mg by mouth at bedtime. 2 tabs (100mg ) at hs 06/22/17  Yes [provider]  bisacodyl (DULCOLAX) 10 MG suppository Place 10 mg rectally daily as needed for moderate constipation.   Yes [provider]  BREO ELLIPTA 100-25 MCG/INH AEPB Take 1 puff by mouth daily. 06/16/18  Yes [provider]  diltiazem (CARDIZEM LA) 300 MG 24 hr tablet Take 300 mg by mouth daily. 03/04/16  Yes [provider]  esomeprazole (NEXIUM) 40 MG capsule Take 40 mg by mouth daily. 06/11/18  Yes [provider]  ferrous sulfate 325 (65 FE) MG tablet Take 325 mg by mouth daily with breakfast.   Yes [provider]  guaiFENesin-dextromethorphan (ROBITUSSIN DM) 100-10 MG/5ML syrup Take 10 mLs by mouth every 4 (four) hours as needed for cough. 07/18/19  Yes Danford, 07/20/19, MD  HYDROcodone-acetaminophen (NORCO/VICODIN) 5-325 MG tablet Take 1 tablet by mouth every 4 (four) hours as needed for moderate pain. 07/18/19  Yes Danford, 07/20/19, MD  INCRUSE ELLIPTA 62.5 MCG/INH AEPB Take 1 puff by mouth daily. 06/10/17  Yes [provider]  linaclotide (LINZESS) 145 MCG CAPS capsule Take 145 mcg by mouth daily before breakfast.   Yes [provider]  magnesium citrate SOLN Take 1 Bottle by mouth daily as needed for moderate constipation. 06/12/17 q24h prn constipation    Yes [provider]  methimazole (TAPAZOLE)  10 MG tablet Take 10 mg by mouth daily.  06/16/18  Yes [provider]  Multiple Vitamin (MULTIVITAMIN WITH MINERALS) TABS tablet Take 1 tablet by mouth daily.   Yes [provider]  Multiple Vitamins-Minerals (MULTIVITAMIN ADULT PO) Take 2 capsules by mouth daily. [employer] AREDS 2 Cap 2 caps qd    Yes [provider]  mycophenolate (CELLCEPT) 500 MG tablet Take 1 tablet (500 mg total) by mouth 2 (two) times daily. 11/27/18  Yes 01/27/19, MD  ondansetron (ZOFRAN) 8 MG tablet Take 8 mg by mouth every 8 (eight) hours as needed for nausea or vomiting.   Yes [provider]  polyethylene glycol (MIRALAX / GLYCOLAX) packet Take 17 g by mouth daily. 10/22/17  Yes 10/24/17, Randel Pigg, MD  polyvinyl alcohol (ARTIFICIAL TEARS) 1.4 % ophthalmic solution Place 1 drop into both eyes 3 (three) times daily.   Yes [provider]  senna (SENOKOT) 8.6 MG tablet Take 2 tablets by mouth daily. senolot  S 8.6-50mg  qd   Yes [provider]  sucralfate (CARAFATE) 1 g tablet Take 1 g by mouth 4 (four) times daily. 06/16/18  Yes [provider]  SUMAtriptan (IMITREX) 50 MG tablet Take 50 mg by mouth daily as needed for migraine. 1 tab every 24 hours as needed for migraine. Limit to 10 days /month to avoid medication overuse.   Yes [provider]  zolpidem (AMBIEN) 10 MG tablet Take 1 tablet (10 mg total) by mouth at bedtime. 07/18/19  Yes Danford, Suann Larry, MD    Allergies    Patient has no known allergies.  Review of Systems   Review of Systems  Unable to perform ROS: Mental status change  Respiratory: Positive for cough.     Physical Exam Updated Vital Signs BP 135/76   Pulse (!) 103   Resp (!) 24   SpO2 93%   Physical Exam CONSTITUTIONAL: Elderly, ill-appearing HEAD: Normocephalic/atraumatic EYES: EOMI/PERRL ENMT: Mucous membranes dry NECK: supple no meningeal signs SPINE/BACK:entire spine nontender CV: S1/S2 noted, no  murmurs/rubs/gallops noted LUNGS: Tachypneic, crackles bilaterally, on nonrebreather ABDOMEN: soft, nontender NEURO: Pt is awake/alert, moves all extremitiesx4.  No facial droop.  Patient appears confused EXTREMITIES: pulses normal/equal, full ROM SKIN: warm, color normal PSYCH:  unable to assess  ED Results / Procedures / Treatments   Labs (all labs ordered are listed, but only abnormal results are displayed) Labs Reviewed  CBC WITH DIFFERENTIAL/PLATELET - Abnormal; Notable for the following components:      Result Value   WBC 17.1 (*)    RBC 5.16 (*)    HCT 47.2 (*)    Platelets 144 (*)    Neutro Abs 15.0 (*)    Monocytes Absolute 1.3 (*)    Abs Immature Granulocytes 0.14 (*)    All other components within normal limits  COMPREHENSIVE METABOLIC PANEL - Abnormal; Notable for the following components:   Potassium 3.3 (*)    Glucose, Bld 118 (*)    Calcium 8.2 (*)    Albumin 3.1 (*)    All other components within normal limits  CULTURE, BLOOD (ROUTINE X 2)  CULTURE, BLOOD (ROUTINE X 2)  EXPECTORATED SPUTUM ASSESSMENT W REFEX TO RESP CULTURE  MRSA PCR SCREENING  URINALYSIS, ROUTINE W REFLEX MICROSCOPIC  LACTIC ACID, PLASMA    EKG EKG Interpretation  Date/Time:  Wednesday July 22 2019 01:56:12 EDT Ventricular Rate:  89 PR Interval:    QRS Duration: 98 QT Interval:  461 QTC Calculation: 561 R Axis:   -2 Text Interpretation: Atrial fibrillation Borderline T wave abnormalities Prolonged QT interval Confirmed by Ripley Fraise ([id number]) on 07/22/2019 2:28:40 AM   Radiology DG Chest Port 1 View  Result Date: 07/22/2019 CLINICAL DATA:  Cough and congestion. EXAM: PORTABLE CHEST 1 VIEW COMPARISON:  July 14, 2019 FINDINGS: Moderate to marked severity areas of atelectasis and/or infiltrate are seen within the bilateral lung bases, right greater than left. There are small bilateral pleural effusions. No pneumothorax is identified. The cardiac silhouette is mildly enlarged and  unchanged in size. There is moderate severity calcification of the thoracic aorta. Degenerative changes seen throughout the thoracic spine. IMPRESSION: 1. Moderate to marked severity bibasilar atelectasis and/or infiltrate, right greater than left. 2. Small bilateral pleural effusions. Electronically Signed   By: Virgina Norfolk M.D.   On: 07/22/2019 01:56    Procedures .Critical Care Performed by: Ripley Fraise, MD Authorized by: Ripley Fraise, MD   Critical care provider statement:    Critical care time (  minutes):  40   Critical care start time:  07/22/2019 1:30 AM   Critical care end time:  07/22/2019 2:10 AM   Critical care time was exclusive of:  Separately billable procedures and treating other patients   Critical care was necessary to treat or prevent imminent or life-threatening deterioration of the following conditions:  Respiratory failure and sepsis   Critical care was time spent personally by me on the following activities:  Pulse oximetry, ordering and review of radiographic studies, discussions with consultants, development of treatment plan with patient or surrogate, evaluation of patient's response to treatment, examination of patient, re-evaluation of patient's condition, review of old charts, ordering and review of laboratory studies and ordering and performing treatments and interventions   I assumed direction of critical care for this patient from another provider in my specialty: no     Medications Ordered in ED Medications  lactated ringers infusion ( Intravenous New Bag/Given 07/22/19 0532)  ceFEPIme (MAXIPIME) 2 g in sodium chloride 0.9 % 100 mL IVPB (has no administration in time range)  vancomycin (VANCOREADY) IVPB 1250 mg/250 mL (has no administration in time range)  vancomycin (VANCOREADY) IVPB 500 mg/100 mL (has no administration in time range)  potassium chloride 20 MEQ/15ML (10%) solution 40 mEq (has no administration in time range)    ED Course  I have  reviewed the triage vital signs and the nursing notes.  Pertinent labs & imaging results that were available during my care of the patient were reviewed by me and considered in my medical decision making (see chart for details).    MDM Rules/Calculators/A&P                     1:26 AM Patient with recent diagnosis of COVID-19 hospitalization and received steroids and remdesivir and has been placed in a nursing facility.  Tonight became hypoxic and altered.  Patient is maintaining oxygenation on nonrebreather.  It is confirmed the patient is a DNR/DNI Will follow closely 5:42 AM Chest x-ray has significant findings of worsening atelectasis and infiltrates.  Patient still on nonrebreather, but will frequently get confused and take it off.  When patient is on room air her pulse ox is in the 70s. I had a conversation with her son Paula Bird over the phone.  I informed him of the critical nature of his mother's illness.  He is aware of this, confirms the patient is a DO NOT INTUBATE.  We will continue to provide oxygen and admit the patient for now.  Discussed with Dr. Leafy Half with [facility] hospitalist for admission Suspect altered mental status is due to underlying Covid pneumonia and hypoxic respiratory failure Patient was checked on multiple times and frequently had to replace the oxygen mask for patient.  Paula Bird was evaluated in Emergency Department on 07/22/2019 for the symptoms described in the history of present illness. She was evaluated in the context of the global COVID-19 pandemic, which necessitated consideration that the patient might be at risk for infection with the SARS-CoV-2 virus that causes COVID-19. Institutional protocols and algorithms that pertain to the evaluation of patients at risk for COVID-19 are in a state of rapid change based on information released by regulatory bodies including the CDC and federal and state organizations. These policies and algorithms were  followed during the patient's care in the ED.  Final Clinical Impression(s) / ED Diagnoses Final diagnoses:  Acute respiratory failure with hypoxia (HCC)  Pneumonia due to COVID-19 virus  Rx / DC Orders ED Discharge Orders    None       Zadie Rhine, MD 07/22/19 [phone]

## 2019-07-22 NOTE — ED Notes (Signed)
Blood cultures and lactic drawn

## 2019-07-22 NOTE — ED Provider Notes (Signed)
Patient has had significant worsening of her respiratory status.  She is on a nonrebreather w/ pulse ox 85%.  Patient still awake and alert and will respond.  Patient is a DO NOT INTUBATE.  I discussed with her son Yuli Lanigan about the critical nature of her illness, he reports he is 2 and half hours away and will make arrangements to see her soon as possible.   Zadie Rhine, MD 07/22/19 [phone]

## 2019-07-22 NOTE — ED Notes (Signed)
Report called to 5W 

## 2019-07-23 DIAGNOSIS — J1282 Pneumonia due to coronavirus disease 2019: Secondary | ICD-10-CM

## 2019-07-23 DIAGNOSIS — Z7189 Other specified counseling: Secondary | ICD-10-CM

## 2019-07-23 DIAGNOSIS — J9601 Acute respiratory failure with hypoxia: Secondary | ICD-10-CM | POA: Diagnosis not present

## 2019-07-23 DIAGNOSIS — U071 COVID-19: Secondary | ICD-10-CM | POA: Diagnosis not present

## 2019-07-23 DIAGNOSIS — Z515 Encounter for palliative care: Secondary | ICD-10-CM

## 2019-07-23 LAB — CBC WITH DIFFERENTIAL/PLATELET
Abs Immature Granulocytes: 0.2 10*3/uL — ABNORMAL HIGH (ref 0.00–0.07)
Basophils Absolute: 0 10*3/uL (ref 0.0–0.1)
Basophils Relative: 0 %
Eosinophils Absolute: 0 10*3/uL (ref 0.0–0.5)
Eosinophils Relative: 0 %
HCT: 43 % (ref 36.0–46.0)
Hemoglobin: 13.4 g/dL (ref 12.0–15.0)
Immature Granulocytes: 1 %
Lymphocytes Relative: 2 %
Lymphs Abs: 0.3 10*3/uL — ABNORMAL LOW (ref 0.7–4.0)
MCH: 28.6 pg (ref 26.0–34.0)
MCHC: 31.2 g/dL (ref 30.0–36.0)
MCV: 91.9 fL (ref 80.0–100.0)
Monocytes Absolute: 0.4 10*3/uL (ref 0.1–1.0)
Monocytes Relative: 2 %
Neutro Abs: 17.7 10*3/uL — ABNORMAL HIGH (ref 1.7–7.7)
Neutrophils Relative %: 95 %
Platelets: 124 10*3/uL — ABNORMAL LOW (ref 150–400)
RBC: 4.68 MIL/uL (ref 3.87–5.11)
RDW: 15.3 % (ref 11.5–15.5)
WBC: 18.7 10*3/uL — ABNORMAL HIGH (ref 4.0–10.5)
nRBC: 0 % (ref 0.0–0.2)

## 2019-07-23 LAB — COMPREHENSIVE METABOLIC PANEL
ALT: 27 U/L (ref 0–44)
AST: 11 U/L — ABNORMAL LOW (ref 15–41)
Albumin: 2.5 g/dL — ABNORMAL LOW (ref 3.5–5.0)
Alkaline Phosphatase: 61 U/L (ref 38–126)
Anion gap: 8 (ref 5–15)
BUN: 17 mg/dL (ref 8–23)
CO2: 25 mmol/L (ref 22–32)
Calcium: 8.2 mg/dL — ABNORMAL LOW (ref 8.9–10.3)
Chloride: 104 mmol/L (ref 98–111)
Creatinine, Ser: 0.4 mg/dL — ABNORMAL LOW (ref 0.44–1.00)
GFR calc Af Amer: >60 mL/min (ref >60)
GFR calc non Af Amer: >60 mL/min (ref >60)
Glucose, Bld: 175 mg/dL — ABNORMAL HIGH (ref 70–99)
Potassium: 3.5 mmol/L (ref 3.5–5.1)
Sodium: 137 mmol/L (ref 135–145)
Total Bilirubin: 0.7 mg/dL (ref 0.3–1.2)
Total Protein: 5.7 g/dL — ABNORMAL LOW (ref 6.5–8.1)

## 2019-07-23 LAB — FERRITIN: Ferritin: 346 ng/mL — ABNORMAL HIGH (ref 11–307)

## 2019-07-23 LAB — BLOOD CULTURE ID PANEL (REFLEXED)

## 2019-07-23 LAB — MAGNESIUM: Magnesium: 2.2 mg/dL (ref 1.7–2.4)

## 2019-07-23 LAB — C-REACTIVE PROTEIN: CRP: 33.1 mg/dL — ABNORMAL HIGH (ref <1.0)

## 2019-07-23 MED ORDER — BIOTENE DRY MOUTH MT LIQD: 15.0000 mL | OROMUCOSAL | Status: DC | PRN

## 2019-07-23 MED ORDER — MORPHINE SULFATE (CONCENTRATE) 10 MG/0.5ML PO SOLN: 5.0000 mg | Freq: Four times a day (QID) | ORAL | Status: DC

## 2019-07-23 MED ORDER — HALOPERIDOL 0.5 MG PO TABS
0.5000 mg | ORAL_TABLET | ORAL | Status: DC | PRN
  Administered 2019-07-23: 0.5 mg via ORAL
  Filled 2019-07-23 (×2): qty 1

## 2019-07-23 MED ORDER — PHENOL 1.4 % MT LIQD
1.0000 | OROMUCOSAL | Status: DC | PRN
  Filled 2019-07-23: qty 177

## 2019-07-23 MED ORDER — GLYCOPYRROLATE 0.2 MG/ML IJ SOLN: 0.2000 mg | INTRAMUSCULAR | Status: DC | PRN

## 2019-07-23 MED ORDER — LIP MEDEX EX OINT: TOPICAL_OINTMENT | CUTANEOUS | Status: DC | PRN

## 2019-07-23 MED ORDER — POLYVINYL ALCOHOL 1.4 % OP SOLN
1.0000 [drp] | Freq: Four times a day (QID) | OPHTHALMIC | Status: DC | PRN
  Administered 2019-07-24: 05:00:00 1 [drp] via OPHTHALMIC
  Filled 2019-07-23: qty 15

## 2019-07-23 MED ORDER — GLYCOPYRROLATE 0.2 MG/ML IJ SOLN
0.4000 mg | Freq: Four times a day (QID) | INTRAMUSCULAR | Status: DC
  Administered 2019-07-23 (×3): 0.4 mg via INTRAVENOUS
  Filled 2019-07-23 (×5): qty 2

## 2019-07-23 MED ORDER — HALOPERIDOL LACTATE 2 MG/ML PO CONC
0.5000 mg | ORAL | Status: DC | PRN
  Filled 2019-07-23: qty 0.3

## 2019-07-23 MED ORDER — LIP MEDEX EX OINT
TOPICAL_OINTMENT | CUTANEOUS | Status: AC
  Filled 2019-07-23: qty 7

## 2019-07-23 MED ORDER — MORPHINE SULFATE (PF) 2 MG/ML IV SOLN
2.0000 mg | Freq: Four times a day (QID) | INTRAVENOUS | Status: DC
  Administered 2019-07-23 – 2019-07-24 (×4): 2 mg via INTRAVENOUS
  Filled 2019-07-23 (×3): qty 1

## 2019-07-23 MED ORDER — LORAZEPAM 2 MG/ML IJ SOLN
1.0000 mg | INTRAMUSCULAR | Status: DC | PRN
  Administered 2019-07-23 – 2019-07-24 (×3): 1 mg via INTRAVENOUS
  Filled 2019-07-23 (×3): qty 1

## 2019-07-23 MED ORDER — GLYCOPYRROLATE 1 MG PO TABS: 1.0000 mg | ORAL_TABLET | ORAL | Status: DC | PRN

## 2019-07-23 MED ORDER — GLYCOPYRROLATE 0.2 MG/ML IJ SOLN
0.4000 mg | INTRAMUSCULAR | Status: DC | PRN
  Filled 2019-07-23: qty 2

## 2019-07-23 MED ORDER — MORPHINE SULFATE (PF) 2 MG/ML IV SOLN
2.0000 mg | INTRAVENOUS | Status: DC | PRN
  Administered 2019-07-24: 2 mg via INTRAVENOUS
  Filled 2019-07-23 (×2): qty 1

## 2019-07-23 MED ORDER — HALOPERIDOL LACTATE 5 MG/ML IJ SOLN
0.5000 mg | INTRAMUSCULAR | Status: DC | PRN
  Administered 2019-07-24: 01:00:00 0.5 mg via INTRAVENOUS
  Filled 2019-07-23: qty 1

## 2019-07-23 MED ORDER — ADULT MULTIVITAMIN W/MINERALS CH
1.0000 | ORAL_TABLET | Freq: Every day | ORAL | Status: DC
  Administered 2019-07-23: 10:00:00 1 via ORAL

## 2019-07-23 NOTE — Progress Notes (Addendum)
PT desat on monitor to 84%  Pt alert & oriented in no distress.  Pt has congested unproductive cough.  Put pt on HFNC @5  L in addition to the NRB @ 15L  Pt maintaining at 88%, no mentation changes, in no distress. Changed o2 sensor Pt sats in low 90's now.  Pt asked about eating, will address NPO status in the AM.  preformed mouth care with mouth wash and oral gel  0245 pt maintaining O2@ 91%, with the exception of when she coughs, she desats to around 85%.  Still alert and oriented and in no distress.  Pt did indicate that she has some discomfort when she coughs.  Still congested non productive cough. Pt currently NPO and plan is to limit sedating drugs at this time.  Will address with primary MD in the morning.

## 2019-07-23 NOTE — Progress Notes (Signed)
Physical Therapy Discharge Patient Details Name: Paula Bird MRN: [id number] DOB: 1925/07/09 Today's Date: 07/23/2019 Time:  -     Patient discharged from PT services secondary to medical decline. GP     Rada Hay 07/23/2019, 12:55 PM     Blanchard Kelch PT Acute Rehabilitation Services Pager [phone] Office [phone]

## 2019-07-23 NOTE — Progress Notes (Signed)
PHARMACY - PHYSICIAN COMMUNICATION CRITICAL VALUE ALERT - BLOOD CULTURE IDENTIFICATION (BCID)  Paula Bird is an 86 y.o. female who presented to [facility] on 10-Aug-2019 with a chief complaint of hypoxia, recent COVID infection  Assessment:  1 of 3 bottles from blood cultures growing Coag Neg Staph  Name of physician (or Provider) Contacted: n/a  Current antibiotics: none, patient now palliative care  Changes to prescribed antibiotics recommended:  none  Results for orders placed or performed during the hospital encounter of 08/10/19  Blood Culture ID Panel (Reflexed) (Collected: 07/22/2019  5:30 AM)  Result Value Ref Range   Enterococcus species NOT DETECTED NOT DETECTED   Listeria monocytogenes NOT DETECTED NOT DETECTED   Staphylococcus species DETECTED (A) NOT DETECTED   Staphylococcus aureus (BCID) NOT DETECTED NOT DETECTED   Methicillin resistance DETECTED (A) NOT DETECTED   Streptococcus species NOT DETECTED NOT DETECTED   Streptococcus agalactiae NOT DETECTED NOT DETECTED   Streptococcus pneumoniae NOT DETECTED NOT DETECTED   Streptococcus pyogenes NOT DETECTED NOT DETECTED   Acinetobacter baumannii NOT DETECTED NOT DETECTED   Enterobacteriaceae species NOT DETECTED NOT DETECTED   Enterobacter cloacae complex NOT DETECTED NOT DETECTED   Escherichia coli NOT DETECTED NOT DETECTED   Klebsiella oxytoca NOT DETECTED NOT DETECTED   Klebsiella pneumoniae NOT DETECTED NOT DETECTED   Proteus species NOT DETECTED NOT DETECTED   Serratia marcescens NOT DETECTED NOT DETECTED   Haemophilus influenzae NOT DETECTED NOT DETECTED   Neisseria meningitidis NOT DETECTED NOT DETECTED   Pseudomonas aeruginosa NOT DETECTED NOT DETECTED   Candida albicans NOT DETECTED NOT DETECTED   Candida glabrata NOT DETECTED NOT DETECTED   Candida krusei NOT DETECTED NOT DETECTED   Candida parapsilosis NOT DETECTED NOT DETECTED   Candida tropicalis NOT DETECTED NOT DETECTED    [name] 07/23/2019  4:30 PM

## 2019-07-23 NOTE — Progress Notes (Signed)
PROGRESS NOTE    Paula Bird  LKT:[id number] DOB: Sep 27, 1925 DOA: 28-Jul-2019 PCP: Angela Cox, MD   Brief Narrative: 86 y.o. female patient with recent diagnosis of Covid pneumonia,  hypertension, atrial fibrillation, chronic kidney disease, COPD not on home oxygen, GERD, hypothyroidism, iron deficiency anemia, irritable bowel syndrome, depression and impaired mobility currently at skilled nursing facility presented to the hospital with complaints of shortness of breath and low oxygen saturation at the facility.  History was limited since patient was sent from the skilled nursing facility.  There was also mention of some altered mental status.  The nurse at the skilled nursing facility noted that she was altered and hypoxic and was placed on oxygen.  Of note, patient was recently discharged from our hospital on 07/18/2019 after treatment for Covid pneumonia.  She was weaned off oxygen prior to discharge and had received remdesivir and Solu-Medrol at that time for 4 days.  At the time of my evaluation, patient denies any nausea vomiting abdominal pain.  Feels thirsty.  Denies any chest pain palpitation.  Denies fever chills.  ED Course: In the ED, WBC was elevated at 17.1.  Potassium 3.3.  Lactate was elevated at 2.5.  Patient was put on nonrebreather mask.  X-ray done in the ED showed worsening atelectasis and infiltrates.  Blood cultures were sent from the ED.  In room air, her pulse ox was 70% in the ED.  Patient was placed on nonrebreather and nasal cannula oxygen and admitted.    Subjective: Seen this morning she is alert awake follows commands but dyspneic on speaking. " Can you do anything about my shortness of breath" Appears tired frail. Afebrile overnight, blood pressure stable remains profoundly hypoxic on nonrebreather 15 L with 5 L nasal cannula  Assessment & Plan:  Acute hypoxic respiratory failure secondary to recent Covid [place]/suspected bacterial pneumonia:. She  was treated with steroids and remdesivir for 4 days while in hospital and discharged home. Suspecting  post Covid bacterial pneumonia.  Patient with profound hypoxia needing nonrebreather with 15 L oxygen nasal cannula at 5 L.  Prognosis poor. Cont vancomycin cefepime,IV steroids , f/u blood culture. Sips and meds- N.p.o. pending speech eval unless patient is transitioned to comfort feeding.  Recently admitted and treated COVID Pneumonia. Inflammatory markers are high as below continue on IV steroid as above. COVID-19 Labs Recent Labs    07/22/19 1859 07/23/19 0153  FERRITIN 298 346*  CRP 32.9* 33.1*   Acute metabolic encephalopathy/mild confusion in the setting of hypoxia/pneumonia:Hold sedatives for now.  holld home Elavil, zolpidem.  Patient is alert awake today appears to have improved.  Mild hypokalemia.  resolved  Sepsis POA/lactic acidosis/leukocytosis likely from pneumonia and steroids, as well as profound hypoxia, possible sepsis POA: avoid excessive IV fluids due to underlying Covid pneumonia.  Blood pressure is stable.  History of COPD with hypoxic respiratory failure:  As above we will continue inhalers steroids supplemental oxygen bronchodilators and antibiotics.    History of chronic atrial fibrillation:  Rate control continue home amiodarone, cardizem, not on AC.  History of myasthenia gravis.  On mycophenolate.  Chronic kidney disease, stage II- creat stable  Hyperthyroidism: cont methimazole.  Anxiety/depression: Hold sedatives for now.  GERD.  Protonix  Goals of care: Had extensive discussion by admitting physician patient is DNR, overall prognosis guarded to very poor, palliative care has been consulted.  DVT prophylaxis:lovenox Code Status: DNR  Family Communication: plan of care discussed with palliative care team who are discussing  with son and daughter for comfort care.discussed with the nursing staff.    Status is: Inpatient Remains inpatient  appropriate because:Hemodynamically unstable, IV treatments appropriate due to intensity of illness or inability to take PO and Inpatient level of care appropriate due to severity of illness   Dispo: The patient is from: Home              Anticipated d/c is to: TBD              Anticipated d/c date is: > 3 days              Patient currently is not medically stable to d/c. Nutrition: Allow ice chips, speech eval Diet Order            Diet NPO time specified  Diet effective now             Body mass index is 27.02 kg/m. Pressure Ulcer:    Consultants:see note  Procedures:see note Microbiology:see note  Medications: Scheduled Meds: . amiodarone  200 mg Oral Daily  . diltiazem  300 mg Oral Daily  . enoxaparin (LOVENOX) injection  40 mg Subcutaneous Q24H  . feeding supplement (ENSURE ENLIVE)  237 mL Oral BID BM  . fluticasone furoate-vilanterol  1 puff Inhalation Daily  . linaclotide  145 mcg Oral QAC breakfast  . methimazole  10 mg Oral Daily  . methylPREDNISolone (SOLU-MEDROL) injection  40 mg Intravenous Q12H  . mycophenolate  500 mg Oral BID  . pantoprazole  40 mg Oral Daily  . polyethylene glycol  17 g Oral Daily  . polyvinyl alcohol  1 drop Both Eyes TID  . senna  2 tablet Oral Daily  . sucralfate  1 g Oral QID  . umeclidinium bromide  1 puff Inhalation Daily   Continuous Infusions: . ceFEPime (MAXIPIME) IV 2 g (07/23/19 0450)  . vancomycin 500 mg (07/23/19 0532)    Antimicrobials: Anti-infectives (From admission, onward)   Start     Dose/Rate Route Frequency Ordered Stop   07/23/19 0600  vancomycin (VANCOREADY) IVPB 500 mg/100 mL     500 mg 100 mL/hr over 60 Minutes Intravenous Every 24 hours 07/22/19 0433     07/22/19 0600  ceFEPIme (MAXIPIME) 2 g in sodium chloride 0.9 % 100 mL IVPB  Status:  Discontinued     2 g 200 mL/hr over 30 Minutes Intravenous Every 8 hours 07/22/19 0418 07/22/19 0427   07/22/19 0600  vancomycin (VANCOREADY) IVPB 1250 mg/250 mL      1,250 mg 166.7 mL/hr over 90 Minutes Intravenous  Once 07/22/19 0433 07/22/19 0800   07/22/19 0530  ceFEPIme (MAXIPIME) 2 g in sodium chloride 0.9 % 100 mL IVPB     2 g 200 mL/hr over 30 Minutes Intravenous Every 12 hours 07/22/19 0427         Objective: Vitals: Today's Vitals   07/22/19 2015 07/22/19 2117 07/22/19 2353 07/23/19 0402  BP: (!) 146/76  120/70 125/79  Pulse: 94  97 (!) 101  Resp:   19 20  Temp: 98.5 F (36.9 C)  97.8 F (36.6 C) 97.6 F (36.4 C)  TempSrc: Oral  Oral Oral  SpO2:   95% 100%  Weight:      Height:      PainSc:  0-No pain      Intake/Output Summary (Last 24 hours) at 07/23/2019 0736 Last data filed at 07/23/2019 0636 Gross per 24 hour  Intake 495.62 ml  Output 700 ml  Net -  204.38 ml   Filed Weights   07/22/19 1850  Weight: 67 kg   Weight change:    Intake/Output from previous day: 04/28 0701 - 04/29 0700 In: 495.6 [I.V.:90.9; IV Piggyback:404.8] Out: 700 [Urine:700] Intake/Output this shift: No intake/output data recorded.  Examination:  General exam: Alert awake visibly short of breath on nonrebreather and nasal cannula, coughing with deep breath.  In moderate distress on speaking HEENT:Oral mucosa moist, Ear/Nose WNL grossly,dentition normal. Respiratory system: bilaterally crackles present diminished breath sound unable to take a deep breath, use of accessory muscle, non tender. Cardiovascular system: S1 & S2 +, regular, No JVD. Gastrointestinal system: Abdomen soft, NT,ND, BS+. Nervous System:Alert, awake, moving all her extremities and grossly nonfocal Extremities: No edema, distal peripheral pulses palpable.  Skin: No rashes,no icterus. MSK: Normal muscle bulk,tone, power  Data Reviewed: I have personally reviewed following labs and imaging studies CBC: Recent Labs  Lab 07/22/19 0106 07/23/19 0153  WBC 17.1* 18.7*  NEUTROABS 15.0* 17.7*  HGB 14.6 13.4  HCT 47.2* 43.0  MCV 91.5 91.9  PLT 144* 762*   Basic Metabolic  Panel: Recent Labs  Lab 07/22/19 0106 07/23/19 0153  NA 136 137  K 3.3* 3.5  CL 100 104  CO2 25 25  GLUCOSE 118* 175*  BUN 20 17  CREATININE 0.53 0.40*  CALCIUM 8.2* 8.2*  MG  --  2.2   GFR: Estimated Creatinine Clearance: 41.1 mL/min (A) (by C-G formula based on SCr of 0.4 mg/dL (L)). Liver Function Tests: Recent Labs  Lab 07/22/19 0106 07/23/19 0153  AST 16 11*  ALT 40 27  ALKPHOS 64 61  BILITOT 0.9 0.7  PROT 6.5 5.7*  ALBUMIN 3.1* 2.5*   No results for input(s): LIPASE, AMYLASE in the last 168 hours. No results for input(s): AMMONIA in the last 168 hours. Coagulation Profile: No results for input(s): INR, PROTIME in the last 168 hours. Cardiac Enzymes: No results for input(s): CKTOTAL, CKMB, CKMBINDEX, TROPONINI in the last 168 hours. BNP (last 3 results) No results for input(s): PROBNP in the last 8760 hours. HbA1C: No results for input(s): HGBA1C in the last 72 hours. CBG: Recent Labs  Lab 07/17/19 1149 07/17/19 1556 07/17/19 2108 07/18/19 0813 07/18/19 1200  GLUCAP 203* 196* 114* 149* 183*   Lipid Profile: No results for input(s): CHOL, HDL, LDLCALC, TRIG, CHOLHDL, LDLDIRECT in the last 72 hours. Thyroid Function Tests: No results for input(s): TSH, T4TOTAL, FREET4, T3FREE, THYROIDAB in the last 72 hours. Anemia Panel: Recent Labs    07/22/19 1859 07/23/19 0153  FERRITIN 298 346*   Sepsis Labs: Recent Labs  Lab 07/22/19 0530 07/22/19 1859  PROCALCITON  --  0.55  LATICACIDVEN 2.5*  --     Recent Results (from the past 240 hour(s))  Blood Culture (routine x 2)     Status: None   Collection Time: 07/14/19  1:09 PM   Specimen: BLOOD RIGHT HAND  Result Value Ref Range Status   Specimen Description   Final    BLOOD RIGHT HAND Performed at [facility], [place] [address], [place], [place] [address]    Special Requests   Final    BOTTLES DRAWN AEROBIC AND ANAEROBIC Blood Culture adequate volume Performed at [place] [address], [place], [place] [address]    Culture   Final    NO GROWTH 5 DAYS Performed at [facility], [place] [address] [place], [place] [address]    Report Status 07/19/2019 FINAL  Final  Blood Culture (routine x 2)     Status: None   Collection Time: 07/14/19  3:59 PM   Specimen: BLOOD  Result Value Ref Range Status   Specimen Description   Final    BLOOD RIGHT ANTECUBITAL Performed at [facility], [address] [place], [place] [address]    Special Requests   Final    BOTTLES DRAWN AEROBIC AND ANAEROBIC Blood Culture results may not be optimal due to an inadequate volume of blood received in culture bottles Performed at [facility], [address] [place], [place] [address]    Culture   Final    NO GROWTH 5 DAYS Performed at [facility], [address], [place], [place] [address]    Report Status 07/19/2019 FINAL  Final  Culture, blood (routine x 2)     Status: None (Preliminary result)   Collection Time: 07/22/19  5:30 AM   Specimen: BLOOD RIGHT HAND  Result Value Ref Range Status   Specimen Description   Final    BLOOD RIGHT HAND Performed at [facility], [address] [place], [place] [address]    Special Requests   Final    BOTTLES DRAWN AEROBIC ONLY Blood Culture adequate volume Performed at [facility], [address], [place], [place] [address]    Culture   Final    NO GROWTH < 12 HOURS Performed at [facility], [address], [place], [place] [address]    Report Status PENDING  Incomplete  Culture, blood (routine x 2)     Status: None (Preliminary result)   Collection Time: 07/22/19  5:30 AM   Specimen: BLOOD  Result Value Ref Range Status   Specimen Description   Final    BLOOD BLOOD RIGHT FOREARM Performed at [facility], [address], [place], [place] [address]    Special Requests   Final    BOTTLES  DRAWN AEROBIC AND ANAEROBIC Blood Culture adequate volume Performed at [facility], [address] [place], [place] [address]    Culture   Final    NO GROWTH < 12 HOURS Performed at [facility], [address], [place], [place] [address]    Report Status PENDING  Incomplete      Radiology Studies: DG Chest Port 1 View  Result Date: 07/22/2019 CLINICAL DATA:  Cough and congestion. EXAM: PORTABLE CHEST 1 VIEW COMPARISON:  July 14, 2019 FINDINGS: Moderate to marked severity areas of atelectasis and/or infiltrate are seen within the bilateral lung bases, right greater than left. There are small bilateral pleural effusions. No pneumothorax is identified. The cardiac silhouette is mildly enlarged and unchanged in size. There is moderate severity calcification of the thoracic aorta. Degenerative changes seen throughout the thoracic spine. IMPRESSION: 1. Moderate to marked severity bibasilar atelectasis and/or infiltrate, right greater than left. 2. Small bilateral pleural effusions. Electronically Signed   By: Aram Candela M.D.   On: 07/22/2019 01:56     LOS: 1 day   Time spent: More than 50% of that time was spent in counseling and/or coordination of care.  Lanae Boast, MD [facility]  07/23/2019, 7:36 AM

## 2019-07-23 NOTE — TOC Progression Note (Addendum)
Transition of Care [facility]) - Progression Note    Patient Details  Name: Paula Bird MRN: [id number] Date of Birth: 03/24/1926  Transition of Care [facility]) CM/SW Contact  Erin Sons, [place] Phone Number: 07/23/2019, 9:38 AM  Clinical Narrative:     CSW notified French Ana at [facility] that palliative plans to consult with patient.   Expected Discharge Plan: Skilled Nursing Facility    Expected Discharge Plan and Services Expected Discharge Plan: Skilled Nursing Facility         Expected Discharge Date: (unknown)                                     Social Determinants of Health (SDOH) Interventions    Readmission Risk Interventions No flowsheet data found.

## 2019-07-23 NOTE — Consult Note (Signed)
Consultation Note Date: 07/23/2019   Patient Name: Paula Bird  DOB: 1926-03-09  MRN: [id number]  Age / Sex: 86 y.o., female  PCP: Merlene Laughter, MD Referring Physician: Antonieta Pert, MD  Reason for Consultation: Establishing goals of care  HPI/Patient Profile: 86 y.o. female  with past medical history of hypertension, atrial fibrillation, CKD, COPD, hypothyroidism, myasthenia gravis, iron deficiency anemia, irritable bowel syndrome, depression, impaired mobility, dysphagia, recent diagnosis COVID pneumonia admission 4/20-4/24 admitted from SNF on 07/15/2019 with hypoxia with likely COVID bacterial pneumonia now. She has high oxygen requirements and metabolic encephalopathy. Overall prognosis poor.   Clinical Assessment and Goals of Care: I met today at Paula Bird' bedside (no family present). She is awake, alert, oriented. She complains of pain in her right eye (I helped her to clean and sooth with warm washcloth), cough with secretions, and she tells me "I'm just so tired." We talked further about her illness and health trajectory and she shares with me that she feels like she is at the end of her life and she has lived a good long life. She is at peace and ready to go. She says she will miss her family and her children but they have good jobs and families or their own and she feels that her job is done and they are all in a good place. She would like to shift focus to keeping her comfortable and allowing her rest and peace. She would like to speak to a chaplain and during our conversation it is clear she has strong faith and she tells me that she is [facility]. She gives me permission to share this conversation with her son and daughter.   I called both son, Paula Bird, and daughter, Paula Bird, separately. They are both saddened but aware of their mother's wishes. They both endorse that she has had poor QOL for many  years and has been ready to die for many years. They agree with goal for comfort as they know this is what she wants and they do not want her to suffer.   All questions/concerns addressed. Emotional support provided.   Primary Decision Maker PATIENT    SUMMARY OF RECOMMENDATIONS   - Patient and family agree with shift to comfort care  Code Status/Advance Care Planning:  DNR   Symptom Management:   Scheduled morphine with as needed doses. May titrate up dosage/frequency as needed to achieve comfort. May also consider infusion if she is requiring frequent doses.   Orders changed to reflect comfort. I left a couple po medications that could potentially add to comfort but low threshold to d/c all po meds.   Palliative Prophylaxis:   Aspiration, Bowel Regimen, Delirium Protocol, Frequent Pain Assessment, Oral Care and Turn Reposition  Additional Recommendations (Limitations, Scope, Preferences):  Full Comfort Care  Psycho-social/Spiritual:   Desire for further Chaplaincy support:yes  Additional Recommendations: Grief/Bereavement Support  Prognosis:   Hours - Days  Discharge Planning: Likely hospital death.      Primary Diagnoses: Present on Admission: . Acute hypoxemic  respiratory failure ([place]) . Hypertension . Obstructive chronic bronchitis with acute exacerbation ([place]) . Paroxysmal atrial fibrillation ([facility]) . Respiratory distress . Hyperthyroidism . COVID-19 virus infection . Pneumonia   I have reviewed the medical record, interviewed the patient and family, and examined the patient. The following aspects are pertinent.  Past Medical History:  Diagnosis Date  . Acute and chronic respiratory failure, unspecified whether with hypoxia or hypercapnia ([place])   . Afib ([place])   . Allergic rhinitis   . Anemia   . CKD (chronic kidney disease)   . Constipation   . COPD (chronic obstructive pulmonary disease) ([place])   . Depression   . Diplopia   . Dysphagia   .  Edema, lower extremity   . Esophageal dysmotility   . Gait instability   . GERD (gastroesophageal reflux disease)   . Hypertension   . Hypothyroidism   . IBS (irritable bowel syndrome)   . IDA (iron deficiency anemia)   . Insomnia   . Leukocytosis   . Myasthenia gravis ([place])   . Oral thrush   . PAF (paroxysmal atrial fibrillation) ([place])   . Physical deconditioning   . Pneumonia 02/2017  . Protein calorie malnutrition ([place])   . Thyroid disease   . Thyroid mass   . Vertigo    Social History   Socioeconomic History  . Marital status: Widowed    Spouse name: Not on file  . Number of children: 2  . Years of education: HS  . Highest education level: Not on file  Occupational History  . Occupation: Retired  Tobacco Use  . Smoking status: Never Smoker  . Smokeless tobacco: Never Used  Substance and Sexual Activity  . Alcohol use: No  . Drug use: No  . Sexual activity: Not Currently  Other Topics Concern  . Not on file  Social History Narrative   Resides at [facility].   Left-handed.   2 cups caffeine per day.   Social Determinants of Health   Financial Resource Strain:   . Difficulty of Paying Living Expenses:   Food Insecurity:   . Worried About Charity fundraiser in the Last Year:   . Arboriculturist in the Last Year:   Transportation Needs:   . Film/video editor (Medical):   Marland Kitchen Lack of Transportation (Non-Medical):   Physical Activity:   . Days of Exercise per Week:   . Minutes of Exercise per Session:   Stress:   . Feeling of Stress :   Social Connections:   . Frequency of Communication with Friends and Family:   . Frequency of Social Gatherings with Friends and Family:   . Attends Religious Services:   . Active Member of Clubs or Organizations:   . Attends Archivist Meetings:   Marland Kitchen Marital Status:    Family History  Problem Relation Age of Onset  . Hypertension Mother   . Hypertension Son   . Diabetes Neg Hx   . Thyroid  disease Neg Hx    Scheduled Meds: . amiodarone  200 mg Oral Daily  . diltiazem  300 mg Oral Daily  . enoxaparin (LOVENOX) injection  40 mg Subcutaneous Q24H  . feeding supplement (ENSURE ENLIVE)  237 mL Oral BID BM  . fluticasone furoate-vilanterol  1 puff Inhalation Daily  . linaclotide  145 mcg Oral QAC breakfast  . methimazole  10 mg Oral Daily  . methylPREDNISolone (SOLU-MEDROL) injection  40 mg Intravenous Q12H  . mycophenolate  500 mg Oral BID  . pantoprazole  40 mg Oral Daily  . polyethylene glycol  17 g Oral Daily  . polyvinyl alcohol  1 drop Both Eyes TID  . senna  2 tablet Oral Daily  . sucralfate  1 g Oral QID  . umeclidinium bromide  1 puff Inhalation Daily   Continuous Infusions: . ceFEPime (MAXIPIME) IV 2 g (07/23/19 0450)  . vancomycin 500 mg (07/23/19 0532)   PRN Meds:.acetaminophen **OR** acetaminophen, bisacodyl, guaiFENesin-dextromethorphan, HYDROcodone-acetaminophen, Ipratropium-Albuterol, lip balm, magnesium citrate, ondansetron, SUMAtriptan No Known Allergies Review of Systems  Constitutional: Positive for activity change, appetite change and fatigue.  Respiratory: Positive for cough and shortness of breath.   Neurological: Positive for weakness.    Physical Exam Vitals and nursing note reviewed.  Constitutional:      General: She is not in acute distress.    Appearance: She is ill-appearing.  Cardiovascular:     Rate and Rhythm: Tachycardia present.  Pulmonary:     Effort: No tachypnea, accessory muscle usage or respiratory distress.     Comments: Increased work of breathing; short of breath when coughing or with any activity.  Abdominal:     Palpations: Abdomen is soft.  Neurological:     Mental Status: She is alert and oriented to person, place, and time.     Vital Signs: BP 125/79 (BP Location: Left Arm)   Pulse (!) 101   Temp 97.6 F (36.4 C) (Oral)   Resp 20   Ht 5' 2"  (1.575 m)   Wt 67 kg   SpO2 100%   BMI 27.02 kg/m  Pain Scale:  0-10   Pain Score: 0-No pain   SpO2: SpO2: 100 % O2 Device:SpO2: 100 % O2 Flow Rate: .O2 Flow Rate (L/min): 5 L/min(NRB 15)  IO: Intake/output summary:   Intake/Output Summary (Last 24 hours) at 07/23/2019 0813 Last data filed at 07/23/2019 0636 Gross per 24 hour  Intake 495.62 ml  Output 700 ml  Net -204.38 ml    LBM:   Baseline Weight: Weight: 67 kg Most recent weight: Weight: 67 kg     Palliative Assessment/Data:     Time In/Out: 1020-1115, 1300-1320 Time Total: 75 min Greater than 50%  of this time was spent counseling and coordinating care related to the above assessment and plan.  Signed by: Vinie Sill, NP Palliative Medicine Team Pager # [phone] (M-F 8a-5p) Team Phone # [phone] (Nights/Weekends)

## 2019-07-23 NOTE — Progress Notes (Signed)
Initial Nutrition Assessment  RD working remotely.   DOCUMENTATION CODES:   Not applicable  INTERVENTION:  - continue Ensure Enlive BID, each supplement provides 350 kcal and 20 grams of protein. - diet advancement as medically feasible. - will order daily multivitamin with minerals.    NUTRITION DIAGNOSIS:   Increased nutrient needs related to acute illness, catabolic illness(COVID-19 infection) as evidenced by estimated needs.  GOAL:   Patient will meet greater than or equal to 90% of their needs  MONITOR:   PO intake, Supplement acceptance, Labs, Weight trends  REASON FOR ASSESSMENT:   Malnutrition Screening Tool  ASSESSMENT:   86 y.o. female  with medical history of HTN, afib, CKD, COPD not on home O2, GERD, hypothyroidism, iron deficiency anemia, IBS, depression, and impaired mobility. She resides at a SNF and presented to the ED with SOB and low O2 sats. She was recently diagnosed with COVID-19 PNA and was discharged from the hospital on 07/18/19.  Patient has been NPO since admission. Ensure Enlive was ordered BID per ONS protocol at the time of admission.   Weight yesterday was 148 lb and is consistent with weights earlier in this month, which are all above all other weights recorded in the chart.   Per notes: - AMS, likely metabolic encephalopathy - acute hypoxic respiratory failure 2/2 COVID-19 infection - overall poor prognosis   Labs reviewed; creatinine: 0.4 mg/dl, Ca: 8.2 mg/dl. Medications reviewed; 40 mg solu-medrol BID, 40 mg oral protonix/day, 1 packet miralax/day, 10 mEq IV KCl x4 runs 4/28, 2 tablets senokot/day, 1 g carafate QID.    NUTRITION - FOCUSED PHYSICAL EXAM:  unable to complete at this time.   Diet Order:   Diet Order            Diet NPO time specified  Diet effective now              EDUCATION NEEDS:   No education needs have been identified at this time  Skin:  Skin Assessment: Reviewed RN Assessment  Last BM:   4/24  Height:   Ht Readings from Last 1 Encounters:  07/22/19 5\' 2"  (1.575 m)    Weight:   Wt Readings from Last 1 Encounters:  07/22/19 67 kg    Ideal Body Weight:  50 kg  BMI:  Body mass index is 27.02 kg/m.  Estimated Nutritional Needs:   Kcal:  07/24/19 kcal  Protein:  70-80 grams  Fluid:  >/= 1.8 L/day     [phone], MS, RD, LDN, CNSC Inpatient Clinical Dietitian RD pager # available in [facility]  After hours/weekend pager # available in [facility]

## 2019-07-23 NOTE — Progress Notes (Signed)
SLP Cancellation Note  Patient Details Name: Paula Bird MRN: [id number] DOB: 02-25-1926   Cancelled treatment:       Reason Eval/Treat Not Completed: Other (comment). Consulted with Palliative Care team, who recommends hold on swallow evaluation at this time, pending discussion with family. Will await direction.  Tonee Silverstein B. Murvin Natal, [facility], CCC-SLP Speech Language Pathologist Office: [phone] Pager: [phone]  Leigh Aurora 07/23/2019, 11:02 AM

## 2019-07-23 NOTE — Plan of Care (Signed)
  Problem: Education: Goal: Knowledge of General Education information will improve Description: Including pain rating scale, medication(s)/side effects and non-pharmacologic comfort measures Outcome: Progressing   Problem: Health Behavior/Discharge Planning: Goal: Ability to manage health-related needs will improve Outcome: Progressing   Problem: Clinical Measurements: Goal: Ability to maintain clinical measurements within normal limits will improve Outcome: Progressing Goal: Will remain free from infection Outcome: Progressing Goal: Diagnostic test results will improve Outcome: Progressing Goal: Respiratory complications will improve Outcome: Progressing Pt remains on NRB, sats @ 95%  Goal: Cardiovascular complication will be avoided Outcome: Progressing   Problem: Activity: Goal: Risk for activity intolerance will decrease Outcome: No progressing Pt able to talk in short choppy sentences, but has some significat desats with that  Problem: Nutrition: Goal: Adequate nutrition will be maintained Outcome: Progressing Pt eating and given ice chips for comfort   Problem: Coping: Goal: Level of anxiety will decrease Outcome: Progressing   Problem: Elimination: Goal: Will not experience complications related to bowel motility Outcome: Progressing Goal: Will not experience complications related to urinary retention Outcome: Progressing   Problem: Pain Managment: Goal: General experience of comfort will improve Outcome: Progressing Pt remains comfortable in bed, cal bell in reach    Problem: Safety: Goal: Ability to remain free from injury will improve Outcome: Progressing   Problem: Skin Integrity: Goal: Risk for impaired skin integrity will decrease Outcome: Progressing

## 2019-07-23 NOTE — Consult Note (Signed)
Chaplain responding to spiritual care consult from palliative.   Provided spiritual support at bedside - life review, empathic space and normalizing emotions, naming values and faith support / prayers.   Ms Paula Bird spoke of her family, her spouse, who died 20 years prior, as well as her s/o.  She spoke of her process of deciding goals of care and framed her life in context of createdness and God's love.   Chaplain shared prayers and held space while Ms Paula Bird prayed.   She wishes this chaplain to call [facility] in [place] to report that she is hospitalized.  Will follow for continued support through her admission.  Please page chaplain 24 hour oncall as needs arise.  [phone].     Clinical time:1.5 hrs.   Burnis Kingfisher, MDiv, [facility]  Lead Clinical Chaplain  Behavioral Health, [facility].

## 2019-07-23 NOTE — Progress Notes (Signed)
OT Cancellation Note  Patient Details Name: Paula Bird MRN: [id number] DOB: [birth year]   Cancelled Treatment:    Reason Eval/Treat Not Completed: Patient not medically ready(Rn Requests therapy hold at this time. Palliative care has been consulted and POC may not include therapy.)  [name] L [name] 07/23/2019, 12:48 PM

## 2019-07-24 DIAGNOSIS — Z515 Encounter for palliative care: Secondary | ICD-10-CM | POA: Diagnosis not present

## 2019-07-24 DIAGNOSIS — J9601 Acute respiratory failure with hypoxia: Secondary | ICD-10-CM | POA: Diagnosis not present

## 2019-07-24 MED ORDER — MORPHINE BOLUS VIA INFUSION
2.0000 mg | INTRAVENOUS | Status: DC | PRN
  Filled 2019-07-24: qty 2

## 2019-07-24 MED ORDER — MORPHINE 100MG IN NS 100ML (1MG/ML) PREMIX INFUSION
1.0000 mg/h | INTRAVENOUS | Status: DC
  Administered 2019-07-24: 3 mg/h via INTRAVENOUS
  Administered 2019-07-24: 2 mg/h via INTRAVENOUS
  Administered 2019-07-25: 3 mg/h via INTRAVENOUS
  Filled 2019-07-24 (×2): qty 100

## 2019-07-24 NOTE — Progress Notes (Signed)
Chaplain spoke with pt granddaughter to arrange facetime call at 11:00.  Will facilitate call with RNs

## 2019-07-24 NOTE — Progress Notes (Addendum)
Facilitated facetime call via hospital phone at 11:00 with grand daughter, Amy and family.   [phone]  Provided family and patient support via phone    Clinical time: .6 hrs.

## 2019-07-24 NOTE — Progress Notes (Signed)
Paged oncall MD Janyth Contes about patients comfort care status and that IV steroids still ordered. RN would like to hold them if possible. Pt gets extremely anxious on steroids and is unable to rest.  This is also an observation by this RN from previous admission. Pt has repeatedly stated she is tired and wants to rest. Pt has had 1 PO dose and 1 IV dose of  PRN haldol, in addition to the 2mg  scheduled IV morphine and a PRN dose of ativan to get her to a state of rest this shift.   RR 14-17 and SPO2@95 -97% on NRB IV steroids discontinued

## 2019-07-24 NOTE — Progress Notes (Signed)
Facilitated facetime call with grand daughter Revonda Standard [phone]  via hospital phone.   Provided support with family and patient at bedside via phone.    Clinical time .75 hrs.

## 2019-07-24 NOTE — Progress Notes (Signed)
   07/24/19 1500  Clinical Encounter Type  Visited With Patient and family together  Visit Type Initial;Psychological support;Spiritual support  Referral From Nurse;Chaplain  Consult/Referral To Chaplain  Spiritual Encounters  Spiritual Needs Emotional;Grief support;Other (Comment) (Spiritual Care Conversation/Support)  Stress Factors  Patient Stress Factors Health changes;Loss;Major life changes  Family Stress Factors Health changes;Major life changes;Loss   I visited with Paula Bird, her son and daughter-in-law. I helped them facetime one of her grandsons. I also helped them facetime other members of the family.  [name]'s son is worried that she will not make it through the night and is anxious for their family members to see her and talk to her.  I provided grief support and a comforting presence.   Please, contact Spiritual Care for further assistance.   Chaplain Clint Bolder M.Div., [facility]

## 2019-07-24 NOTE — Progress Notes (Signed)
Daily Progress Note   Patient Name: Paula Bird       Date: 07/24/2019 DOB: 02/21/1926  Age: 86 y.o. MRN#: [id number] Attending Physician: Shawna Clamp, MD Primary Care Physician: Merlene Laughter, MD Admit Date: 07/12/2019  Reason for Consultation/Follow-up: Terminal Care  Subjective: Chaplain at bedside - facetime calls with family, patient interactive with them but lethargic. Per chaplain, less alert and interactive today. She is on a nonrebreather. Continuously complaining of dry mouth. Slight increased work of breathing.  Length of Stay: 2  Current Medications: Scheduled Meds:  . glycopyrrolate  0.4 mg Intravenous QID  . polyvinyl alcohol  1 drop Both Eyes TID    Continuous Infusions: . morphine      PRN Meds: acetaminophen **OR** acetaminophen, antiseptic oral rinse, bisacodyl, [DISCONTINUED] glycopyrrolate **OR** [DISCONTINUED] glycopyrrolate **OR** glycopyrrolate, guaiFENesin-dextromethorphan, haloperidol **OR** haloperidol **OR** haloperidol lactate, Ipratropium-Albuterol, lip balm, LORazepam, morphine injection, morphine, ondansetron, phenol, polyvinyl alcohol, SUMAtriptan  Physical Exam Constitutional:      Comments: Lethargic - wakes to voice  Cardiovascular:     Rate and Rhythm: Normal rate.  Pulmonary:     Comments: Slightly labored, increased RR, on nonrebreather Skin:    General: Skin is warm and dry.  Neurological:     Comments: UTA - speech garbled             Vital Signs: BP (!) 146/80 (BP Location: Left Arm)   Pulse (!) 108   Temp 97.9 F (36.6 C) (Oral)   Resp 20   Ht 5\' 2"  (1.575 m)   Wt 67 kg   SpO2 91%   BMI 27.02 kg/m  SpO2: SpO2: 91 % O2 Device: O2 Device: NRB O2 Flow Rate: O2 Flow Rate (L/min): 15 L/min  Intake/output summary:    Intake/Output Summary (Last 24 hours) at 07/24/2019 1136 Last data filed at 07/24/2019 0700 Gross per 24 hour  Intake 116.19 ml  Output 250 ml  Net -133.81 ml   LBM: Last BM Date: (Prior to readmission ) Baseline Weight: Weight: 67 kg Most recent weight: Weight: 67 kg       Palliative Assessment/Data: PPS 20%      Patient Active Problem List   Diagnosis Date Noted  . Palliative care by specialist   . Acute hypoxemic respiratory failure ([place]) 07/22/2019  . Pneumonia 07/22/2019  . COVID-19 virus infection 07/14/2019  . Closed fracture of left ankle   . Closed fracture of right ankle   . Fibula fracture 10/19/2017  . Avulsion fracture of medial malleolus of left tibia 10/19/2017  . UTI (urinary tract infection) 07/17/2017  . Infection due to parainfluenza virus 4 03/06/2017  . Gait abnormality 06/07/2016  . Hyperthyroidism 04/24/2016  . Myasthenia gravis ([place]) 02/07/2016  . Ptosis of eyelid, right 01/31/2016  . Pain, eye, right 01/31/2016  . Falling 01/31/2016  . Vertigo 10/08/2015  . Colon polyp 08/30/2015  . Arthritis, degenerative 08/30/2015  . Acid indigestion 08/30/2015  . Psoriasis 08/30/2015  . Avitaminosis D 08/30/2015  . HCAP (healthcare-associated pneumonia) 08/08/2015  . Respiratory distress 08/08/2015  . Essential hypertension 08/08/2015  . COPD exacerbation ([place]) 08/08/2015  . Dyspnea 08/04/2015  . Hypertension 08/04/2015  . Depression 08/04/2015  . Insomnia 08/04/2015  .  Obstructive chronic bronchitis with acute exacerbation (HCC) 08/04/2015  . Thrombocytopenia (HCC) 08/04/2015  . Paroxysmal atrial fibrillation (HCC) 08/04/2015  . Acute exacerbation of chronic obstructive bronchitis (HCC) 08/04/2015  . Infection of urinary tract 07/01/2014  . Bradycardia 11/24/2013  . Fall in home 11/22/2013  . Pulmonary hypertension (HCC) 11/19/2013  . Excessive falling 09/07/2012  . Encounter for general adult medical examination without abnormal findings  03/24/2012  . Atrial paroxysmal tachycardia (HCC) 10/05/2011  . Family history of colon cancer 05/15/2011  . Mass of pelvis 05/15/2011    Palliative Care Assessment & Plan   HPI: 86 y.o. female  with past medical history of hypertension, atrial fibrillation, CKD, COPD, hypothyroidism, myasthenia gravis, iron deficiency anemia, irritable bowel syndrome, depression, impaired mobility, dysphagia, recent diagnosis COVID pneumonia admission 4/20-4/24 admitted from SNF on 08-01-2019 with hypoxia with likely COVID bacterial pneumonia now. She has high oxygen requirements and metabolic encephalopathy. Overall prognosis poor.   Assessment: Follow up today for symptom check and continued comfort care.  Per discussion with Yong Channel NP yesterday - patient made comfort care.  Today she is less interactive with increased work of breathing. Complaining of dry mouth. Discussed with her removing non-rebreather and replacing with nasal cannula as this may help with dry mouth and is not adding comfort. Also discontinued scheduled robinul. Initiated morphine infusion d/t increased work of breathing - discussed with patient and she agrees.   Discussed plan with RN. Family has seen patient via facetime this morning and son coming later today.   Recommendations/Plan:  Initiate morphine infusion  D/c scheduled robinul  Switch to nasal cannula instead of nonrebreather as this is only adding to discomfort  D/c oral meds - patient has been unable to take today  Goals of Care and Additional Recommendations:  Limitations on Scope of Treatment: Full Comfort Care  Code Status:  DNR  Prognosis:   Hours - Days  Discharge Planning:  Anticipated Hospital Death  Care plan was discussed with RN  Thank you for allowing the Palliative Medicine Team to assist in the care of this patient.   Total Time 35 minutes Prolonged Time Billed  no       Greater than 50%  of this time was spent counseling and  coordinating care related to the above assessment and plan.  Gerlean Ren, DNP, [facility] Palliative Medicine Team Team Phone # [phone]  Pager [phone]

## 2019-07-24 NOTE — Progress Notes (Addendum)
Facilitated facetime call via hospital phone at 10:30 with son, Selmer Dominion, and other family.   Provided family and patient support via phone.   Clinical time: .75 hrs,.

## 2019-07-24 NOTE — Progress Notes (Addendum)
PROGRESS NOTE    Paula Bird  DJM:[id number] DOB: Aug 24, 1925 DOA: 07/02/2019 PCP: Angela Cox, MD   Brief Narrative:  86 y.o.femalepatientwith recentdiagnosis of Covid pneumonia,hypertension, atrial fibrillation, chronic kidney disease, COPD not on home oxygen, GERD, hypothyroidism, iron deficiency anemia, irritable bowel syndrome, depression and impaired mobility currently at skilled nursing facility presented to the hospital with complaints of shortness of breath and low oxygen saturation at the facility. History was limited since patient was sent from the skilled nursing facility. There was also mention of some altered mental status. The nurse at the skilled nursing facility noted that she was altered and hypoxic and was placed on oxygen. Of note,patient was recently discharged from our hospital on 07/18/2019 after treatment for Covid pneumonia. She was weaned off oxygen prior to discharge and had received remdesivir and Solu-Medrol at that time for 4 days.At the time of my evaluation, patient denies any nausea vomiting abdominal pain. Feels thirsty. Denies any chest pain palpitation. Denies fever chills.  ED Course: In the ED,WBC was elevated at 17.1. Potassium 3.3. Lactate was elevated at 2.5. Patientwas put on nonrebreather mask. X-ray done in the ED showed worsening atelectasis and infiltrates.Blood cultures were sent from the ED.In room air,her pulse ox was 70% in the ED.  Patient was placed on nonrebreather and nasal cannula oxygen and admitted.    Assessment & Plan:   Principal Problem:   Acute hypoxemic respiratory failure (HCC) Active Problems:   Hypertension   Obstructive chronic bronchitis with acute exacerbation (HCC)   Paroxysmal atrial fibrillation (HCC)   Respiratory distress   Hyperthyroidism   COVID-19 virus infection   Pneumonia   Palliative care by specialist  Acute hypoxic respiratory failure secondary to recent Covid PNA  /suspected bacterial pneumonia:. She was treated with steroids and remdesivir for 4 days while in hospital and discharged home. Suspecting  post Covid bacterial pneumonia.  Patient with profound hypoxia needing nonrebreather with 15 L oxygen nasal cannula at 5 L.  Prognosis poor. Cont vancomycin cefepime, IV steroids , f/u blood culture. Sips and meds- N.p.o. pending speech eval unless patient is transitioned to comfort feeding.  Recently admitted and treated COVID Pneumonia. Inflammatory markers are high as below, continue on IV steroid as above. COVID-19 Labs  Recent Labs    07/22/19 1859 07/23/19 0153  FERRITIN 298 346*  CRP 32.9* 33.1*    Lab Results  Component Value Date   SARSCOV2NAA NEGATIVE 08/25/2018    Acute metabolic encephalopathy/mild confusion in the setting of hypoxia/pneumonia: Hold sedatives for now.  holld home Elavil,zolpidem.  Patient is alert, awake today appears to have improved.  Mild hypokalemia.resolved  Sepsis POA /lactic acidosis/leukocytosis likely from pneumonia and steroids, as well as profound hypoxia, possible sepsis POA: avoid excessive IV fluids due to underlying Covid pneumonia.  Blood pressure is stable.Cont vancomycin cefepime, IV steroids , f/u blood culture.  History of COPD with hypoxic respiratory failure: As above we will continue inhalers steroids supplemental oxygen bronchodilators and antibiotics.    History of chronic atrial fibrillation: Rate control continue home amiodarone, cardizem, not on AC.  History of myasthenia gravis.On mycophenolate.  Chronic kidney disease, stage II- creat stable  Hyperthyroidism: cont methimazole.  Anxiety/depression: Hold sedatives for now.  GERD.Protonix  Goals of care: Had extensive discussion by admitting physician patient is DNR, overall prognosis guarded to very poor, palliative care has been consulted.  DVT prophylaxis:lovenox Code Status: DNR  Family Communication: plan of care  discussed with palliative care team who are discussing with  son and daughter for comfort care.discussed with the nursing staff.    Family and the patient opted comfort care.  Status is: Inpatient Remains inpatient appropriate because: Hemodynamically unstable, IV treatments appropriate due to intensity of illness or inability to take PO and Inpatient level of care appropriate due to severity of illness   Consultants:    None.  Procedures:  Antimicrobials: Anti-infectives (From admission, onward)   Start     Dose/Rate Route Frequency Ordered Stop   07/23/19 0600  vancomycin (VANCOREADY) IVPB 500 mg/100 mL  Status:  Discontinued     500 mg 100 mL/hr over 60 Minutes Intravenous Every 24 hours 07/22/19 0433 07/23/19 1304   07/22/19 0600  ceFEPIme (MAXIPIME) 2 g in sodium chloride 0.9 % 100 mL IVPB  Status:  Discontinued     2 g 200 mL/hr over 30 Minutes Intravenous Every 8 hours 07/22/19 0418 07/22/19 0427   07/22/19 0600  vancomycin (VANCOREADY) IVPB 1250 mg/250 mL     1,250 mg 166.7 mL/hr over 90 Minutes Intravenous  Once 07/22/19 0433 07/22/19 0800   07/22/19 0530  ceFEPIme (MAXIPIME) 2 g in sodium chloride 0.9 % 100 mL IVPB  Status:  Discontinued     2 g 200 mL/hr over 30 Minutes Intravenous Every 12 hours 07/22/19 0427 07/23/19 1249      Subjective: Seen and examined at bedside.  She is alert, awake follows commands but very short of breath on speaking.  She appears tired,  Frail,  no overnight events.  She is on very high oxygen concentration 15 L nonrebreather.   Objective: Vitals:   07/23/19 0402 07/23/19 1226 07/23/19 1800 07/23/19 2026  BP: 125/79 (!) 142/90  (!) 146/80  Pulse: (!) 101   (!) 108  Resp: 20   20  Temp: 97.6 F (36.4 C) 98.1 F (36.7 C)  97.9 F (36.6 C)  TempSrc: Oral   Oral  SpO2: 100% (!) 86% 91%   Weight:      Height:        Intake/Output Summary (Last 24 hours) at 07/24/2019 1245 Last data filed at 07/24/2019 0700 Gross per 24 hour  Intake  116.19 ml  Output 250 ml  Net -133.81 ml   Filed Weights   07/22/19 1850  Weight: 67 kg    Examination:  General exam: Alert, awake, follows commands,  on nonrebreather. Respiratory system: diminished breath sound,  unable to take a deep breath, use of accessory muscle, non tender Cardiovascular system: S1 & S2 heard, RRR. No JVD, murmurs, rubs, gallops or clicks. No pedal edema. Gastrointestinal system: Abdomen is nondistended, soft and nontender. No organomegaly or masses felt. Normal bowel sounds heard. Central nervous system: Alert and oriented. No focal neurological deficits. Extremities: Symmetric 5 x 5 power. Skin: No rashes, lesions or ulcers Psychiatry: Judgement and insight appear normal. Mood & affect appropriate.     Data Reviewed: I have personally reviewed following labs and imaging studies  CBC: Recent Labs  Lab 07/22/19 0106 07/23/19 0153  WBC 17.1* 18.7*  NEUTROABS 15.0* 17.7*  HGB 14.6 13.4  HCT 47.2* 43.0  MCV 91.5 91.9  PLT 144* 124*   Basic Metabolic Panel: Recent Labs  Lab 07/22/19 0106 07/23/19 0153  NA 136 137  K 3.3* 3.5  CL 100 104  CO2 25 25  GLUCOSE 118* 175*  BUN 20 17  CREATININE 0.53 0.40*  CALCIUM 8.2* 8.2*  MG  --  2.2   GFR: Estimated Creatinine Clearance: 41.1 mL/min (A) (by  C-G formula based on SCr of 0.4 mg/dL (L)). Liver Function Tests: Recent Labs  Lab 07/22/19 0106 07/23/19 0153  AST 16 11*  ALT 40 27  ALKPHOS 64 61  BILITOT 0.9 0.7  PROT 6.5 5.7*  ALBUMIN 3.1* 2.5*   No results for input(s): LIPASE, AMYLASE in the last 168 hours. No results for input(s): AMMONIA in the last 168 hours. Coagulation Profile: No results for input(s): INR, PROTIME in the last 168 hours. Cardiac Enzymes: No results for input(s): CKTOTAL, CKMB, CKMBINDEX, TROPONINI in the last 168 hours. BNP (last 3 results) No results for input(s): PROBNP in the last 8760 hours. HbA1C: No results for input(s): HGBA1C in the last 72  hours. CBG: Recent Labs  Lab 07/17/19 1556 07/17/19 2108 07/18/19 0813 07/18/19 1200  GLUCAP 196* 114* 149* 183*   Lipid Profile: No results for input(s): CHOL, HDL, LDLCALC, TRIG, CHOLHDL, LDLDIRECT in the last 72 hours. Thyroid Function Tests: No results for input(s): TSH, T4TOTAL, FREET4, T3FREE, THYROIDAB in the last 72 hours. Anemia Panel: Recent Labs    07/22/19 1859 07/23/19 0153  FERRITIN 298 346*   Sepsis Labs: Recent Labs  Lab 07/22/19 0530 07/22/19 1859  PROCALCITON  --  0.55  LATICACIDVEN 2.5*  --     Recent Results (from the past 240 hour(s))  Blood Culture (routine x 2)     Status: None   Collection Time: 07/14/19  1:09 PM   Specimen: BLOOD RIGHT HAND  Result Value Ref Range Status   Specimen Description   Final    BLOOD RIGHT HAND Performed at [facility], [address] [place], [place] [address]    Special Requests   Final    BOTTLES DRAWN AEROBIC AND ANAEROBIC Blood Culture adequate volume Performed at [facility], [name] [place], [place] [address]    Culture   Final    NO GROWTH 5 DAYS Performed at [facility], [address], [place], [place] [address]    Report Status 07/19/2019 FINAL  Final  Blood Culture (routine x 2)     Status: None   Collection Time: 07/14/19  3:59 PM   Specimen: BLOOD  Result Value Ref Range Status   Specimen Description   Final    BLOOD RIGHT ANTECUBITAL Performed at [facility], [address], [place], [place] [address]    Special Requests   Final    BOTTLES DRAWN AEROBIC AND ANAEROBIC Blood Culture results may not be optimal due to an inadequate volume of blood received in culture bottles Performed at [facility], [address], [place], [place] [address]    Culture   Final    NO GROWTH 5 DAYS Performed at [facility], [address] [place], [place] [address]    Report Status 07/19/2019 FINAL  Final   Culture, blood (routine x 2)     Status: None (Preliminary result)   Collection Time: 07/22/19  5:30 AM   Specimen: BLOOD RIGHT HAND  Result Value Ref Range Status   Specimen Description   Final    BLOOD RIGHT HAND Performed at [facility], [address] [place], [place] [address]    Special Requests   Final    BOTTLES DRAWN AEROBIC ONLY Blood Culture adequate volume Performed at [facility], [address], [place], [place] [address]    Culture  Setup Time   Final    GRAM POSITIVE COCCI  IN CLUSTERS AEROBIC BOTTLE ONLY CRITICAL RESULT CALLED TO, READ BACK BY AND VERIFIED WITH: Christean Grief PharmD 16:25 07/23/19 ([email or web address]) Performed at [facility], [address], [place], [place] [address]    Culture GRAM POSITIVE COCCI  Final   Report Status PENDING  Incomplete  Culture, blood (routine x 2)     Status: None (Preliminary result)   Collection Time: 07/22/19  5:30 AM   Specimen: BLOOD  Result Value Ref Range Status   Specimen Description   Final    BLOOD BLOOD RIGHT FOREARM Performed at [place] [address] [place], [place] [address]    Special Requests   Final    BOTTLES DRAWN AEROBIC AND ANAEROBIC Blood Culture adequate volume Performed at [place] [address] [place], [place] [address]    Culture  Setup Time   Final    AEROBIC BOTTLE ONLY GRAM POSITIVE COCCI IN CLUSTERS CRITICAL VALUE NOTED.  VALUE IS CONSISTENT WITH PREVIOUSLY REPORTED AND CALLED VALUE. Performed at [facility], [place] [address] [place], [place] [address]    Culture GRAM POSITIVE COCCI  Final   Report Status PENDING  Incomplete  Blood Culture ID Panel (Reflexed)     Status: Abnormal   Collection Time: 07/22/19  5:30 AM  Result Value Ref Range Status   Enterococcus species NOT DETECTED NOT DETECTED Final   Listeria monocytogenes NOT DETECTED NOT DETECTED Final   Staphylococcus species DETECTED (A)  NOT DETECTED Final    Comment: Methicillin (oxacillin) resistant coagulase negative staphylococcus. Possible blood culture contaminant (unless isolated from more than one blood culture draw or clinical case suggests pathogenicity). No antibiotic treatment is indicated for blood  culture contaminants. CRITICAL RESULT CALLED TO, READ BACK BY AND VERIFIED WITH: Christean Grief PharmD 16:25 07/23/19 ([email or web address])    Staphylococcus aureus (BCID) NOT DETECTED NOT DETECTED Final   Methicillin resistance DETECTED (A) NOT DETECTED Final    Comment: CRITICAL RESULT CALLED TO, READ BACK BY AND VERIFIED WITH: Christean Grief PharmD 16:25 07/23/19 ([email or web address])    Streptococcus species NOT DETECTED NOT DETECTED Final   Streptococcus agalactiae NOT DETECTED NOT DETECTED Final   Streptococcus pneumoniae NOT DETECTED NOT DETECTED Final   Streptococcus pyogenes NOT DETECTED NOT DETECTED Final   Acinetobacter baumannii NOT DETECTED NOT DETECTED Final   Enterobacteriaceae species NOT DETECTED NOT DETECTED Final   Enterobacter cloacae complex NOT DETECTED NOT DETECTED Final   Escherichia coli NOT DETECTED NOT DETECTED Final   Klebsiella oxytoca NOT DETECTED NOT DETECTED Final   Klebsiella pneumoniae NOT DETECTED NOT DETECTED Final   Proteus species NOT DETECTED NOT DETECTED Final   Serratia marcescens NOT DETECTED NOT DETECTED Final   Haemophilus influenzae NOT DETECTED NOT DETECTED Final   Neisseria meningitidis NOT DETECTED NOT DETECTED Final   Pseudomonas aeruginosa NOT DETECTED NOT DETECTED Final   Candida albicans NOT DETECTED NOT DETECTED Final   Candida glabrata NOT DETECTED NOT DETECTED Final   Candida krusei NOT DETECTED NOT DETECTED Final   Candida parapsilosis NOT DETECTED NOT DETECTED Final   Candida tropicalis NOT DETECTED NOT DETECTED Final    Comment: Performed at [facility], [place]. [address] [place], [place] [address]     Radiology Studies: No results found.   Scheduled Meds: . polyvinyl alcohol  1  drop Both Eyes TID   Continuous Infusions: . morphine 2 mg/hr (07/24/19 1233)     LOS: 2 days    Time spent: 35 mins.  Shawna Clamp, MD [facility]   If 7PM-7AM, please contact night-coverage

## 2019-07-24 NOTE — Progress Notes (Signed)
Pt alert to self comfort measurers given. Increased Morphine drip to 3. Called Pt's grandson Trey Paula to given him and update. No new changes at this time. I will continue to monitor.

## 2019-07-25 DIAGNOSIS — J9601 Acute respiratory failure with hypoxia: Secondary | ICD-10-CM | POA: Diagnosis not present

## 2019-07-25 LAB — CULTURE, BLOOD (ROUTINE X 2)
Special Requests: ADEQUATE
Special Requests: ADEQUATE

## 2019-07-25 NOTE — Progress Notes (Signed)
PROGRESS NOTE    Paula Bird  SPQ:[id number] DOB: Apr 09, 1925 DOA: 06/28/2019 PCP: Angela Cox, MD   Brief Narrative:  86 y.o.femalepatientwith recentdiagnosis of Covid pneumonia,hypertension, atrial fibrillation, chronic kidney disease, COPD not on home oxygen, GERD, hypothyroidism, iron deficiency anemia, irritable bowel syndrome, depression and impaired mobility currently at skilled nursing facility presented to the hospital with complaints of shortness of breath and low oxygen saturation at the facility. History was limited since patient was sent from the skilled nursing facility. There was also mention of some altered mental status. The nurse at the skilled nursing facility noted that she was altered and hypoxic and was placed on oxygen. Of note,patient was recently discharged from our hospital on 07/18/2019 after treatment for Covid pneumonia. She was weaned off oxygen prior to discharge and had received remdesivir and Solu-Medrol at that time for 4 days.At the time of my evaluation, patient denies any nausea vomiting abdominal pain. Feels thirsty. Denies any chest pain palpitation. Denies fever chills.  ED Course: In the ED,WBC was elevated at 17.1. Potassium 3.3. Lactate was elevated at 2.5. Patientwas put on nonrebreather mask. X-ray done in the ED showed worsening atelectasis and infiltrates.Blood cultures were sent from the ED.In room air,her pulse ox was 70% in the ED.  Patient was placed on nonrebreather and nasal cannula oxygen and admitted.     Patient is not making any significant improvement.  Palliative consulted.  Patient and family has agreed for comfort care.  Patient is on morphine infusion for comfort care.  Assessment & Plan:   Principal Problem:   Acute hypoxemic respiratory failure (HCC) Active Problems:   Hypertension   Obstructive chronic bronchitis with acute exacerbation (HCC)   Paroxysmal atrial fibrillation (HCC)  Respiratory distress   Hyperthyroidism   COVID-19 virus infection   Pneumonia   Palliative care by specialist   Terminal care  Acute hypoxic respiratory failure secondary to recent Covid PNA /suspected bacterial pneumonia:. She was treated with steroids and remdesivir for 4 days while in hospital and discharged home. Suspecting  post Covid bacterial pneumonia.  Patient with profound hypoxia needing nonrebreather with 15 L oxygen nasal cannula at 5 L.  Prognosis poor. Cont vancomycin cefepime, IV steroids , f/u blood culture. Sips and meds- N.p.o. pending speech eval unless patient is transitioned to comfort feeding.  Palliative care consulted. Patient is very lethargic, has shallow breathing, on 100% non-rebreather. Patient is requiring very high amount of oxygen.  Family and patient has agreed for comfort care.  Recently admitted and treated COVID Pneumonia. Inflammatory markers are high as below, continue on IV steroid as above. COVID-19 Labs  Recent Labs    07/22/19 1859 07/23/19 0153  FERRITIN 298 346*  CRP 32.9* 33.1*    Lab Results  Component Value Date   SARSCOV2NAA NEGATIVE 08/25/2018    Acute metabolic Encephalopathy / mild confusion in the setting of hypoxia / pneumonia: Hold sedatives for now.  hold home Elavil,zolpidem.  Patient is very lethargic, has shallow breathing, on 100% non-rebreather.  Mild hypokalemia.resolved  Sepsis POA /lactic acidosis/leukocytosis likely from pneumonia and steroids, as well as profound hypoxia, possible sepsis POA: avoid excessive IV fluids due to underlying Covid pneumonia.  Blood pressure is stable. D/C vancomycin cefepime, IV steroids , f/u blood culture.  History of COPD with hypoxic respiratory failure: Consider DC inhalers steroids supplemental oxygen bronchodilators and antibiotics.    History of chronic atrial fibrillation: Rate control continue home amiodarone, cardizem, not on AC.  History of myasthenia gravis.On  mycophenolate.  Chronic kidney disease, stage II- creat stable  Hyperthyroidism: cont methimazole.  Anxiety/depression: Hold sedatives for now.  GERD.Protonix  Goals of care: Had extensive discussion by admitting physician patient is DNR, overall prognosis guarded to very poor, palliative care has been consulted.  DVT prophylaxis:lovenox Code Status: DNR/ Comfort care. Family Communication: Plan of care discussed with palliative care team who has discussed with son and daughter for comfort care. Family and the patient opted comfort care.  Status is: Inpatient Remains inpatient appropriate because: Hemodynamically unstable, comfort measures  Consultants:    None.  Procedures:  Antimicrobials: Anti-infectives (From admission, onward)   Start     Dose/Rate Route Frequency Ordered Stop   07/23/19 0600  vancomycin (VANCOREADY) IVPB 500 mg/100 mL  Status:  Discontinued     500 mg 100 mL/hr over 60 Minutes Intravenous Every 24 hours 07/22/19 0433 07/23/19 1304   07/22/19 0600  ceFEPIme (MAXIPIME) 2 g in sodium chloride 0.9 % 100 mL IVPB  Status:  Discontinued     2 g 200 mL/hr over 30 Minutes Intravenous Every 8 hours 07/22/19 0418 07/22/19 0427   07/22/19 0600  vancomycin (VANCOREADY) IVPB 1250 mg/250 mL     1,250 mg 166.7 mL/hr over 90 Minutes Intravenous  Once 07/22/19 0433 07/22/19 0800   07/22/19 0530  ceFEPIme (MAXIPIME) 2 g in sodium chloride 0.9 % 100 mL IVPB  Status:  Discontinued     2 g 200 mL/hr over 30 Minutes Intravenous Every 12 hours 07/22/19 0427 07/23/19 1249      Subjective: Seen and examined at bedside.  She is very lethargic, requiring100% nonrebreather.  She has very shallow breathing, on morphine infusion.   Objective: Vitals:   07/23/19 1226 07/23/19 1800 07/23/19 2026 07/24/19 2020  BP: (!) 142/90  (!) 146/80 136/76  Pulse:   (!) 108 (!) 107  Resp:   20 14  Temp: 98.1 F (36.7 C)  97.9 F (36.6 C) 98.2 F (36.8 C)  TempSrc:   Oral Oral    SpO2: (!) 86% 91%  (!) 88%  Weight:      Height:        Intake/Output Summary (Last 24 hours) at 07/25/2019 1141 Last data filed at 07/25/2019 0600 Gross per 24 hour  Intake 36 ml  Output 0 ml  Net 36 ml   Filed Weights   07/22/19 1850  Weight: 67 kg    Examination:  General exam: Lethargic,shallow breathing,  on nonrebreather. Respiratory system: diminished breath sound,  unable to take a deep breath, use of accessory muscle, non tender Cardiovascular system: S1 & S2 heard, RRR. No JVD, murmurs, rubs, gallops or clicks. No pedal edema. Gastrointestinal system: Abdomen is nondistended, soft and nontender. No organomegaly or masses felt. Normal bowel sounds heard. Central nervous system: Alert and oriented. No focal neurological deficits. Extremities: Not assessed. Skin: No rashes, lesions or ulcers Psychiatry: Judgement and insight appear normal. Mood & affect appropriate.     Data Reviewed: I have personally reviewed following labs and imaging studies  CBC: Recent Labs  Lab 07/22/19 0106 07/23/19 0153  WBC 17.1* 18.7*  NEUTROABS 15.0* 17.7*  HGB 14.6 13.4  HCT 47.2* 43.0  MCV 91.5 91.9  PLT 144* 124*   Basic Metabolic Panel: Recent Labs  Lab 07/22/19 0106 07/23/19 0153  NA 136 137  K 3.3* 3.5  CL 100 104  CO2 25 25  GLUCOSE 118* 175*  BUN 20 17  CREATININE 0.53 0.40*  CALCIUM 8.2* 8.2*  MG  --  2.2   GFR: Estimated Creatinine Clearance: 41.1 mL/min (A) (by C-G formula based on SCr of 0.4 mg/dL (L)). Liver Function Tests: Recent Labs  Lab 07/22/19 0106 07/23/19 0153  AST 16 11*  ALT 40 27  ALKPHOS 64 61  BILITOT 0.9 0.7  PROT 6.5 5.7*  ALBUMIN 3.1* 2.5*   No results for input(s): LIPASE, AMYLASE in the last 168 hours. No results for input(s): AMMONIA in the last 168 hours. Coagulation Profile: No results for input(s): INR, PROTIME in the last 168 hours. Cardiac Enzymes: No results for input(s): CKTOTAL, CKMB, CKMBINDEX, TROPONINI in the last  168 hours. BNP (last 3 results) No results for input(s): PROBNP in the last 8760 hours. HbA1C: No results for input(s): HGBA1C in the last 72 hours. CBG: Recent Labs  Lab 07/18/19 1200  GLUCAP 183*   Lipid Profile: No results for input(s): CHOL, HDL, LDLCALC, TRIG, CHOLHDL, LDLDIRECT in the last 72 hours. Thyroid Function Tests: No results for input(s): TSH, T4TOTAL, FREET4, T3FREE, THYROIDAB in the last 72 hours. Anemia Panel: Recent Labs    07/22/19 1859 07/23/19 0153  FERRITIN 298 346*   Sepsis Labs: Recent Labs  Lab 07/22/19 0530 07/22/19 1859  PROCALCITON  --  0.55  LATICACIDVEN 2.5*  --     Recent Results (from the past 240 hour(s))  Culture, blood (routine x 2)     Status: Abnormal   Collection Time: 07/22/19  5:30 AM   Specimen: BLOOD RIGHT HAND  Result Value Ref Range Status   Specimen Description   Final    BLOOD RIGHT HAND Performed at [facility], [address], [place], [place] [address]    Special Requests   Final    BOTTLES DRAWN AEROBIC ONLY Blood Culture adequate volume Performed at [facility], [address], [place], [place] [address]    Culture  Setup Time   Final    GRAM POSITIVE COCCI IN CLUSTERS AEROBIC BOTTLE ONLY CRITICAL RESULT CALLED TO, READ BACK BY AND VERIFIED WITH: Azzie Glatter PharmD 16:25 07/23/19 ([email or web address]) Performed at [facility], [address] [place], [place] [address]    Culture STAPHYLOCOCCUS WARNERI (A)  Final   Report Status 07/25/2019 FINAL  Final   Organism ID, Bacteria STAPHYLOCOCCUS WARNERI  Final      Susceptibility   Staphylococcus warneri - MIC*    CIPROFLOXACIN >=8 RESISTANT Resistant     ERYTHROMYCIN >=8 RESISTANT Resistant     GENTAMICIN <=0.5 SENSITIVE Sensitive     OXACILLIN >=4 RESISTANT Resistant     TETRACYCLINE 2 SENSITIVE Sensitive     VANCOMYCIN 1 SENSITIVE Sensitive     TRIMETH/SULFA <=10 SENSITIVE Sensitive     CLINDAMYCIN RESISTANT Resistant      RIFAMPIN <=0.5 SENSITIVE Sensitive     Inducible Clindamycin POSITIVE Resistant     * STAPHYLOCOCCUS WARNERI  Culture, blood (routine x 2)     Status: Abnormal   Collection Time: 07/22/19  5:30 AM   Specimen: BLOOD  Result Value Ref Range Status   Specimen Description   Final    BLOOD BLOOD RIGHT FOREARM Performed at [facility], [address] [place], [place] [address]    Special Requests   Final    BOTTLES DRAWN AEROBIC AND ANAEROBIC Blood Culture adequate volume Performed at [facility], [address], [place], [place] [address]    Culture  Setup Time   Final    AEROBIC BOTTLE  ONLY GRAM POSITIVE COCCI IN CLUSTERS CRITICAL VALUE NOTED.  VALUE IS CONSISTENT WITH PREVIOUSLY REPORTED AND CALLED VALUE.    Culture (A)  Final    STAPHYLOCOCCUS WARNERI SUSCEPTIBILITIES PERFORMED ON PREVIOUS CULTURE WITHIN THE LAST 5 DAYS. Performed at [facility], [place] [address], [place], [place] [address]    Report Status 07/25/2019 FINAL  Final  Blood Culture ID Panel (Reflexed)     Status: Abnormal   Collection Time: 07/22/19  5:30 AM  Result Value Ref Range Status   Enterococcus species NOT DETECTED NOT DETECTED Final   Listeria monocytogenes NOT DETECTED NOT DETECTED Final   Staphylococcus species DETECTED (A) NOT DETECTED Final    Comment: Methicillin (oxacillin) resistant coagulase negative staphylococcus. Possible blood culture contaminant (unless isolated from more than one blood culture draw or clinical case suggests pathogenicity). No antibiotic treatment is indicated for blood  culture contaminants. CRITICAL RESULT CALLED TO, READ BACK BY AND VERIFIED WITH: Christean Grief PharmD 16:25 07/23/19 ([email or web address])    Staphylococcus aureus (BCID) NOT DETECTED NOT DETECTED Final   Methicillin resistance DETECTED (A) NOT DETECTED Final    Comment: CRITICAL RESULT CALLED TO, READ BACK BY AND VERIFIED WITH: Christean Grief PharmD 16:25 07/23/19 ([email or web address])     Streptococcus species NOT DETECTED NOT DETECTED Final   Streptococcus agalactiae NOT DETECTED NOT DETECTED Final   Streptococcus pneumoniae NOT DETECTED NOT DETECTED Final   Streptococcus pyogenes NOT DETECTED NOT DETECTED Final   Acinetobacter baumannii NOT DETECTED NOT DETECTED Final   Enterobacteriaceae species NOT DETECTED NOT DETECTED Final   Enterobacter cloacae complex NOT DETECTED NOT DETECTED Final   Escherichia coli NOT DETECTED NOT DETECTED Final   Klebsiella oxytoca NOT DETECTED NOT DETECTED Final   Klebsiella pneumoniae NOT DETECTED NOT DETECTED Final   Proteus species NOT DETECTED NOT DETECTED Final   Serratia marcescens NOT DETECTED NOT DETECTED Final   Haemophilus influenzae NOT DETECTED NOT DETECTED Final   Neisseria meningitidis NOT DETECTED NOT DETECTED Final   Pseudomonas aeruginosa NOT DETECTED NOT DETECTED Final   Candida albicans NOT DETECTED NOT DETECTED Final   Candida glabrata NOT DETECTED NOT DETECTED Final   Candida krusei NOT DETECTED NOT DETECTED Final   Candida parapsilosis NOT DETECTED NOT DETECTED Final   Candida tropicalis NOT DETECTED NOT DETECTED Final    Comment: Performed at [facility], [place]. [address], [place], [place] [address]     Radiology Studies: No results found.   Scheduled Meds: . polyvinyl alcohol  1 drop Both Eyes TID   Continuous Infusions: . morphine 3 mg/hr (07/24/19 2017)     LOS: 3 days    Time spent: 25 mins.    Shawna Clamp, MD [facility]   If 7PM-7AM, please contact night-coverage

## 2019-07-25 DEATH — deceased

## 2019-07-26 DIAGNOSIS — N182 Chronic kidney disease, stage 2 (mild): Secondary | ICD-10-CM

## 2019-07-26 DIAGNOSIS — G7000 Myasthenia gravis without (acute) exacerbation: Secondary | ICD-10-CM

## 2019-07-26 DIAGNOSIS — A419 Sepsis, unspecified organism: Secondary | ICD-10-CM

## 2019-07-26 DIAGNOSIS — G9341 Metabolic encephalopathy: Secondary | ICD-10-CM

## 2019-07-26 DIAGNOSIS — K219 Gastro-esophageal reflux disease without esophagitis: Secondary | ICD-10-CM

## 2019-07-26 DIAGNOSIS — I482 Chronic atrial fibrillation, unspecified: Secondary | ICD-10-CM

## 2019-07-26 DIAGNOSIS — F418 Other specified anxiety disorders: Secondary | ICD-10-CM

## 2019-07-26 DIAGNOSIS — E872 Acidosis: Secondary | ICD-10-CM

## 2019-07-26 DIAGNOSIS — D72829 Elevated white blood cell count, unspecified: Secondary | ICD-10-CM

## 2019-07-27 NOTE — Progress Notes (Signed)
At pt request, phoned [facility] and reported that pt is hospitalized.  Left voicemail in church office.

## 2019-07-27 NOTE — Progress Notes (Signed)
42 mg/ml morphine drip wasted in Stericycle witness Charise Carwin RN

## 2019-07-30 ENCOUNTER — Ambulatory Visit: Payer: Medicare Other | Admitting: Neurology

## 2019-08-25 NOTE — Progress Notes (Signed)
MD notified of pt with no pulse,HR resp, or BP at 2120. Request order for 2 nurses to pronounce. Will notify family.

## 2019-08-25 NOTE — Progress Notes (Signed)
Reached out to Pallative care MD about pt condidtion. Morhine drip titrated down to 1mg /hr. Will continue to assess.

## 2019-08-25 NOTE — Progress Notes (Signed)
PMT no charge note  Chart reviewed, discussed with NP Ms Jimmey Ralph and Ms Arlyss Repress last week. Med history noted. Discussed with [facility] MD on 5-1 and with bedside RN on 5-2. Appreciate chaplain follow up as well.   Patient is noted to be comfortable on current opioids and on comfort feeds. O2 requirements seem to be decreasing and her awake ness/alertness continues to improve.   Chart reviewed in detail, overall, at this juncture, would recommend continuation of comfort care and morphine infusion. I worry that this may be a rally. PMT to follow up again on 5-3 for additional goals of care and disposition discussions with family.   No charge.  Rosalin Hawking MD [place] palliative [phone].

## 2019-08-25 NOTE — NC FL2 (Signed)
[place] MEDICAID FL2 LEVEL OF CARE SCREENING TOOL     IDENTIFICATION  Patient Name: Paula Bird Birthdate: 04-Jul-1925 Sex: female Admission Date (Current Location): Aug 12, 2019  [facility] and [place] Number:  Producer, television/film/video and Address:  [facility],  [address] [place], [place] [address]      Provider Number: [id number]  Attending Physician Name and Address:  Reva Bores, MD  Relative Name and Phone Number:  Valisha, Heslin [phone]  or Ricks,Jane Daughter [phone]  [phone] or Ricks,Wayne Other   [phone]    Current Level of Care: Hospital Recommended Level of Care: Skilled Nursing Facility Prior Approval Number:    Date Approved/Denied:   PASRR Number: [id number]  Discharge Plan: SNF    Current Diagnoses: Patient Active Problem List   Diagnosis Date Noted  . Terminal care   . Palliative care by specialist   . Acute hypoxemic respiratory failure (HCC) 07/22/2019  . Pneumonia 07/22/2019  . COVID-19 virus infection 07/14/2019  . Closed fracture of left ankle   . Closed fracture of right ankle   . Fibula fracture 10/19/2017  . Avulsion fracture of medial malleolus of left tibia 10/19/2017  . UTI (urinary tract infection) 07/17/2017  . Infection due to parainfluenza virus 4 03/06/2017  . Gait abnormality 06/07/2016  . Hyperthyroidism 04/24/2016  . Myasthenia gravis (HCC) 02/07/2016  . Ptosis of eyelid, right 01/31/2016  . Pain, eye, right 01/31/2016  . Falling 01/31/2016  . Vertigo 10/08/2015  . Colon polyp 08/30/2015  . Arthritis, degenerative 08/30/2015  . Acid indigestion 08/30/2015  . Psoriasis 08/30/2015  . Avitaminosis D 08/30/2015  . HCAP (healthcare-associated pneumonia) 08/08/2015  . Respiratory distress 08/08/2015  . Essential hypertension 08/08/2015  . COPD exacerbation (HCC) 08/08/2015  . Dyspnea 08/04/2015  . Hypertension 08/04/2015  . Depression 08/04/2015  . Insomnia 08/04/2015  . Obstructive chronic  bronchitis with acute exacerbation (HCC) 08/04/2015  . Thrombocytopenia (HCC) 08/04/2015  . Paroxysmal atrial fibrillation (HCC) 08/04/2015  . Acute exacerbation of chronic obstructive bronchitis (HCC) 08/04/2015  . Infection of urinary tract 07/01/2014  . Bradycardia 11/24/2013  . Fall in home 11/22/2013  . Pulmonary hypertension (HCC) 11/19/2013  . Excessive falling 09/07/2012  . Encounter for general adult medical examination without abnormal findings 03/24/2012  . Atrial paroxysmal tachycardia (HCC) 10/05/2011  . Family history of colon cancer 05/15/2011  . Mass of pelvis 05/15/2011    Orientation RESPIRATION BLADDER Height & Weight     Self, Time, Situation  O2 Continent Weight: 147 lb 11.3 oz (67 kg) Height:  5\' 2"  (157.5 cm)  BEHAVIORAL SYMPTOMS/MOOD NEUROLOGICAL BOWEL NUTRITION STATUS      Continent Diet  AMBULATORY STATUS COMMUNICATION OF NEEDS Skin   Limited Assist Verbally Normal                       Personal Care Assistance Level of Assistance  Bathing, Feeding, Dressing Bathing Assistance: Limited assistance Feeding assistance: Independent Dressing Assistance: Limited assistance     Functional Limitations Info  Sight, Hearing, Speech Sight Info: Adequate Hearing Info: Adequate Speech Info: Adequate    SPECIAL CARE FACTORS FREQUENCY  PT (By licensed PT), OT (By licensed OT)     PT Frequency: 5x per week OT Frequency: 5x per week            Contractures Contractures Info: Not present    Additional Factors Info  Code Status, Allergies, Insulin Sliding Scale, Isolation Precautions Code Status Info: DNR Allergies Info: NKA  Insulin Sliding Scale Info: INSULIN APART (NOVOLOG) INJECTION 0-5 Units min 3x a day with meals Isolation Precautions Info: COVID + on Air/Con Precautions     Current Medications (08/04/2019):  This is the current hospital active medication list Current Facility-Administered Medications  Medication Dose Route Frequency  Provider Last Rate Last Admin  . acetaminophen (TYLENOL) tablet 650 mg  650 mg Oral Q6H PRN Pokhrel, Laxman, MD       Or  . acetaminophen (TYLENOL) suppository 650 mg  650 mg Rectal Q6H PRN Pokhrel, Laxman, MD      . antiseptic oral rinse (BIOTENE) solution 15 mL  15 mL Topical PRN Pershing Proud, NP      . bisacodyl (DULCOLAX) suppository 10 mg  10 mg Rectal Daily PRN Pokhrel, Laxman, MD      . glycopyrrolate (ROBINUL) injection 0.4 mg  0.4 mg Intravenous Z6X PRN Pershing Proud, NP      . guaiFENesin-dextromethorphan (ROBITUSSIN DM) 100-10 MG/5ML syrup 10 mL  10 mL Oral Q4H PRN Pokhrel, Laxman, MD   10 mL at 07/22/19 1538  . haloperidol (HALDOL) tablet 0.5 mg  0.5 mg Oral W9U PRN Pershing Proud, NP   0.5 mg at 07/23/19 2104   Or  . haloperidol (HALDOL) 2 MG/ML solution 0.5 mg  0.5 mg Sublingual E4V PRN Pershing Proud, NP       Or  . haloperidol lactate (HALDOL) injection 0.5 mg  0.5 mg Intravenous W0J PRN Pershing Proud, NP   0.5 mg at 07/24/19 0108  . Ipratropium-Albuterol (COMBIVENT) respimat 1 puff  1 puff Inhalation Q6H PRN Pokhrel, Laxman, MD   1 puff at 07/23/19 1436  . lip balm (CARMEX) ointment   Topical PRN Pokhrel, Laxman, MD      . LORazepam (ATIVAN) injection 1 mg  1 mg Intravenous W1X PRN Pershing Proud, NP   1 mg at 07/24/19 0453  . morphine 100mg  in NS 16mL (1mg /mL) infusion - premix  2-5 mg/hr Intravenous Continuous Philis Pique, NP 3 mL/hr at 07/25/19 1750 3 mg/hr at 07/25/19 1750  . morphine 2 MG/ML injection 2 mg  2 mg Intravenous B1Y PRN Pershing Proud, NP   2 mg at 07/24/19 1028  . morphine bolus via infusion 2 mg  2 mg Intravenous Q15 min PRN Philis Pique, NP      . ondansetron (ZOFRAN) tablet 8 mg  8 mg Oral Q8H PRN Pokhrel, Laxman, MD      . phenol (CHLORASEPTIC) mouth spray 1 spray  1 spray Mouth/Throat PRN Kc, Ramesh, MD      . polyvinyl alcohol (LIQUIFILM TEARS) 1.4 % ophthalmic solution 1 drop  1 drop Both Eyes TID Pokhrel, Laxman, MD   1  drop at 07/25/19 2103  . polyvinyl alcohol (LIQUIFILM TEARS) 1.4 % ophthalmic solution 1 drop  1 drop Both Eyes QID PRN Pershing Proud, NP   1 drop at 07/24/19 0453  . SUMAtriptan (IMITREX) tablet 50 mg  50 mg Oral Daily PRN Pokhrel, Laxman, MD         Discharge Medications: Please see discharge summary for a list of discharge medications.  Relevant Imaging Results:  Relevant Lab Results:   Additional Information COVID + SSN [id number]  Greg Cutter, LCSW

## 2019-08-25 NOTE — Progress Notes (Addendum)
Patient ID: Paula Bird, female   DOB: Mar 14, 1926, 86 y.o.   MRN: [id number]  PROGRESS NOTE    Paula Bird  LFY:[id number] DOB: [birth year] DOA: 07/23/2019 PCP: Angela Cox, MD    Brief Narrative:  86 y.o.femalepatientwith recentdiagnosis of Covid pneumonia,hypertension, atrial fibrillation, chronic kidney disease, COPD not on home oxygen, GERD, hypothyroidism, iron deficiency anemia, irritable bowel syndrome, depression and impaired mobility currently at skilled nursing facility presented to the hospital with complaints of shortness of breath and low oxygen saturation at the facility. History was limited since patient was sent from the skilled nursing facility. There was also mention of some altered mental status. The nurse at the skilled nursing facility noted that she was altered and hypoxic and was placed on oxygen. Of note,patient was recently discharged from our hospital on 07/18/2019 after treatment for Covid pneumonia. She was weaned off oxygen prior to discharge and had received remdesivir and Solu-Medrol at that time for 4 days.At the time of my evaluation, patient denies any nausea vomiting abdominal pain. Feels thirsty. Denies any chest pain palpitation. Denies fever chills.  ED Course: In the ED,WBC was elevated at 17.1. Potassium 3.3. Lactate was elevated at 2.5. Patientwas put on nonrebreather mask. X-ray done in the ED showed worsening atelectasis and infiltrates.Blood cultures were sent from the ED.In room air,her pulse ox was 70% in the ED. Patient was placed on nonrebreather and nasal cannula oxygen and admitted.   Patient is not making any significant improvement.  Palliative consulted.  Patient and family has agreed for comfort care.  Patient is on morphine infusion for comfort care.   Assessment & Plan:   Principal Problem:   Acute hypoxemic respiratory failure (HCC) Active Problems:   Hypertension   Obstructive chronic  bronchitis with acute exacerbation (HCC)   Paroxysmal atrial fibrillation (HCC)   Respiratory distress   Hyperthyroidism   COVID-19 virus infection   Pneumonia   Palliative care by specialist   Terminal care  Acute hypoxic respiratory failuresecondary to recent Covid PNA /suspected bacterial pneumonia:.She was treated with steroids and remdesivir for 4 days while in hospitaland discharged home.Suspectingpost Covid bacterial pneumonia.Patient with profound hypoxia needing nonrebreather with 15 L oxygen nasal cannula at 5 L.Prognosis poor. Contvancomycin cefepime, IV steroids, f/ublood culture.Sips and meds-N.p.o. pending speech eval unless patient is transitioned to comfort feeding. Palliative care consulted. Patient is very lethargic, has shallow breathing, on 100% non-rebreather. Patient is requiring very high amount of oxygen. Family and patient has agreed for comfort care. Oxygen requirements are weaning down.  Recently admitted and treatedCOVID Pneumonia. Inflammatory markers are high on no treatment for now.  Acute metabolic Encephalopathy / mild confusionin the setting of hypoxia / pneumonia: Hold sedatives for now.hold homeElavil,zolpidem.Patient is very lethargic, has shallow breathing, on 100% non-rebreather.  Mild hypokalemia.resolved  SepsisPOA /lactic acidosis/leukocytosislikely from pneumonia and steroids,as well as profound hypoxia,possible sepsis ENI:DPOEU excessive IV fluids due to underlying Covid pneumonia.Blood pressure is stable. D/Cvancomycin cefepime, IV steroids, f/ublood culture.  History of COPD with hypoxic respiratory failure:Consider DC inhalers steroids supplemental oxygen bronchodilators and antibiotics.   History of chronic atrial fibrillation:Rate control continue home amiodarone, cardizem, not on AC.  History of myasthenia gravis.On mycophenolate.  Chronic kidney disease, stage II- creat  stable.  Hyperthyroidism:cont methimazole.  Anxiety/depression:Hold sedatives for now.  GERD.Protonix  Goals of care:Had extensive discussion by admitting physician patient is DNR, overall prognosis guarded to very poor, palliative care has been consulted.   DVT prophylaxis: Lovenox SQ Code Status: DNR/comfort care Family Communication:  Daughter, no answer on son's phone.  Consultants:   Palleative care  Procedures:  None  Antimicrobials: Anti-infectives (From admission, onward)   Start     Dose/Rate Route Frequency Ordered Stop   07/23/19 0600  vancomycin (VANCOREADY) IVPB 500 mg/100 mL  Status:  Discontinued     500 mg 100 mL/hr over 60 Minutes Intravenous Every 24 hours 07/22/19 0433 07/23/19 1304   07/22/19 0600  ceFEPIme (MAXIPIME) 2 g in sodium chloride 0.9 % 100 mL IVPB  Status:  Discontinued     2 g 200 mL/hr over 30 Minutes Intravenous Every 8 hours 07/22/19 0418 07/22/19 0427   07/22/19 0600  vancomycin (VANCOREADY) IVPB 1250 mg/250 mL     1,250 mg 166.7 mL/hr over 90 Minutes Intravenous  Once 07/22/19 0433 07/22/19 0800   07/22/19 0530  ceFEPIme (MAXIPIME) 2 g in sodium chloride 0.9 % 100 mL IVPB  Status:  Discontinued     2 g 200 mL/hr over 30 Minutes Intravenous Every 12 hours 07/22/19 0427 07/23/19 1249       Subjective: Awake and alert. Is thirsty. Remains confused.  Objective: Vitals:   07/23/19 2026 07/24/19 2020 07/25/19 2112 08/24/2019 0517  BP: (!) 146/80 136/76    Pulse: (!) 108 (!) 107 (!) 126 (!) 130  Resp: 20 14    Temp: 97.9 F (36.6 C) 98.2 F (36.8 C)    TempSrc: Oral Oral    SpO2:  (!) 88%  (!) 69%  Weight:      Height:        Intake/Output Summary (Last 24 hours) at 08/10/2019 1259 Last data filed at 08/08/2019 0225 Gross per 24 hour  Intake 0 ml  Output 0 ml  Net 0 ml   Filed Weights   07/22/19 1850  Weight: 67 kg    Examination:  General exam: Appears calm and comfortable  Respiratory system: rhonchi throughout.  Increased WOB Cardiovascular system: S1 & S2 heard, RRR.  Gastrointestinal system: Abdomen is nondistended, soft and nontender.  Central nervous system: Alert and confused. No focal neurological deficits. Extremities: Symmetric  Skin: No rashes .   Data Reviewed: I have personally reviewed following labs and imaging studies  CBC: Recent Labs  Lab 07/22/19 0106 07/23/19 0153  WBC 17.1* 18.7*  NEUTROABS 15.0* 17.7*  HGB 14.6 13.4  HCT 47.2* 43.0  MCV 91.5 91.9  PLT 144* 124*   Basic Metabolic Panel: Recent Labs  Lab 07/22/19 0106 07/23/19 0153  NA 136 137  K 3.3* 3.5  CL 100 104  CO2 25 25  GLUCOSE 118* 175*  BUN 20 17  CREATININE 0.53 0.40*  CALCIUM 8.2* 8.2*  MG  --  2.2   GFR: Estimated Creatinine Clearance: 41.1 mL/min (A) (by C-G formula based on SCr of 0.4 mg/dL (L)). Liver Function Tests: Recent Labs  Lab 07/22/19 0106 07/23/19 0153  AST 16 11*  ALT 40 27  ALKPHOS 64 61  BILITOT 0.9 0.7  PROT 6.5 5.7*  ALBUMIN 3.1* 2.5*   Sepsis Labs: Recent Labs  Lab 07/22/19 0530 07/22/19 1859  PROCALCITON  --  0.55  LATICACIDVEN 2.5*  --     Recent Results (from the past 240 hour(s))  Culture, blood (routine x 2)     Status: Abnormal   Collection Time: 07/22/19  5:30 AM   Specimen: BLOOD RIGHT HAND  Result Value Ref Range Status   Specimen Description   Final    BLOOD RIGHT HAND Performed at [facility],  [place] [address] [place], [place] [address]    Special Requests   Final    BOTTLES DRAWN AEROBIC ONLY Blood Culture adequate volume Performed at [facility] [address] [place], [place] [address]    Culture  Setup Time   Final    GRAM POSITIVE COCCI IN CLUSTERS AEROBIC BOTTLE ONLY CRITICAL RESULT CALLED TO, READ BACK BY AND VERIFIED WITH: Christean Grief PharmD 16:25 07/23/19 ([email or web address]) Performed at [facility], [place] [address], [place], [place] [address]    Culture STAPHYLOCOCCUS WARNERI (A)  Final    Report Status 07/25/2019 FINAL  Final   Organism ID, Bacteria STAPHYLOCOCCUS WARNERI  Final      Susceptibility   Staphylococcus warneri - MIC*    CIPROFLOXACIN >=8 RESISTANT Resistant     ERYTHROMYCIN >=8 RESISTANT Resistant     GENTAMICIN <=0.5 SENSITIVE Sensitive     OXACILLIN >=4 RESISTANT Resistant     TETRACYCLINE 2 SENSITIVE Sensitive     VANCOMYCIN 1 SENSITIVE Sensitive     TRIMETH/SULFA <=10 SENSITIVE Sensitive     CLINDAMYCIN RESISTANT Resistant     RIFAMPIN <=0.5 SENSITIVE Sensitive     Inducible Clindamycin POSITIVE Resistant     * STAPHYLOCOCCUS WARNERI  Culture, blood (routine x 2)     Status: Abnormal   Collection Time: 07/22/19  5:30 AM   Specimen: BLOOD  Result Value Ref Range Status   Specimen Description   Final    BLOOD BLOOD RIGHT FOREARM Performed at [facility] [address], [place], [place] [address]    Special Requests   Final    BOTTLES DRAWN AEROBIC AND ANAEROBIC Blood Culture adequate volume Performed at [facility] [address] [place], [place] [address]    Culture  Setup Time   Final    AEROBIC BOTTLE ONLY GRAM POSITIVE COCCI IN CLUSTERS CRITICAL VALUE NOTED.  VALUE IS CONSISTENT WITH PREVIOUSLY REPORTED AND CALLED VALUE.    Culture (A)  Final    STAPHYLOCOCCUS WARNERI SUSCEPTIBILITIES PERFORMED ON PREVIOUS CULTURE WITHIN THE LAST 5 DAYS. Performed at [facility], [place] [address], [place], [place] [address]    Report Status 07/25/2019 FINAL  Final  Blood Culture ID Panel (Reflexed)     Status: Abnormal   Collection Time: 07/22/19  5:30 AM  Result Value Ref Range Status   Enterococcus species NOT DETECTED NOT DETECTED Final   Listeria monocytogenes NOT DETECTED NOT DETECTED Final   Staphylococcus species DETECTED (A) NOT DETECTED Final    Comment: Methicillin (oxacillin) resistant coagulase negative staphylococcus. Possible blood culture contaminant (unless isolated from more than one blood  culture draw or clinical case suggests pathogenicity). No antibiotic treatment is indicated for blood  culture contaminants. CRITICAL RESULT CALLED TO, READ BACK BY AND VERIFIED WITH: Christean Grief PharmD 16:25 07/23/19 ([email or web address])    Staphylococcus aureus (BCID) NOT DETECTED NOT DETECTED Final   Methicillin resistance DETECTED (A) NOT DETECTED Final    Comment: CRITICAL RESULT CALLED TO, READ BACK BY AND VERIFIED WITH: Christean Grief PharmD 16:25 07/23/19 ([email or web address])    Streptococcus species NOT DETECTED NOT DETECTED Final   Streptococcus agalactiae NOT DETECTED NOT DETECTED Final   Streptococcus pneumoniae NOT DETECTED NOT DETECTED Final   Streptococcus pyogenes NOT DETECTED NOT DETECTED Final   Acinetobacter baumannii NOT DETECTED NOT DETECTED Final   Enterobacteriaceae species NOT DETECTED NOT DETECTED Final   Enterobacter cloacae complex NOT DETECTED NOT DETECTED Final   Escherichia coli NOT DETECTED  NOT DETECTED Final   Klebsiella oxytoca NOT DETECTED NOT DETECTED Final   Klebsiella pneumoniae NOT DETECTED NOT DETECTED Final   Proteus species NOT DETECTED NOT DETECTED Final   Serratia marcescens NOT DETECTED NOT DETECTED Final   Haemophilus influenzae NOT DETECTED NOT DETECTED Final   Neisseria meningitidis NOT DETECTED NOT DETECTED Final   Pseudomonas aeruginosa NOT DETECTED NOT DETECTED Final   Candida albicans NOT DETECTED NOT DETECTED Final   Candida glabrata NOT DETECTED NOT DETECTED Final   Candida krusei NOT DETECTED NOT DETECTED Final   Candida parapsilosis NOT DETECTED NOT DETECTED Final   Candida tropicalis NOT DETECTED NOT DETECTED Final    Comment: Performed at [facility], [address] [place], [place] [address]     Scheduled Meds: . polyvinyl alcohol  1 drop Both Eyes TID   Continuous Infusions: . morphine 3 mg/hr (07/25/19 1750)     LOS: 4 days    Reva Bores, MD 08/08/2019 12:59 PM [phone] [facility] If 7PM-7AM, please contact  night-coverage 07/31/2019, 12:59 PM

## 2019-08-25 NOTE — Death Summary Note (Signed)
DEATH SUMMARY   Patient Details  Name: Paula Bird MRN: [id number] DOB: July 23, 1925  Admission/Discharge Information   Admit Date:  08-18-19  Date of Death: Date of Death: 23-Aug-2019  Time of Death: Time of Death: 08-03-2118  Length of Stay: 5  Referring Physician: Angela Cox, MD   Reason(s) for Hospitalization  complications of  COVID pneumonia  Diagnoses  Preliminary cause of death:  Secondary Diagnoses (including complications and co-morbidities):  Principal Problem:   Acute hypoxemic respiratory failure (HCC) Active Problems:   Hypertension   Obstructive chronic bronchitis with acute exacerbation (HCC)   Paroxysmal atrial fibrillation (HCC)   Respiratory distress   Hyperthyroidism   COVID-19 virus infection   Pneumonia   Palliative care by specialist   Terminal care   Brief Hospital Course (including significant findings, care, treatment, and services provided and events leading to death)  Paula Bird is a 86 y.o. year old female with recentdiagnosis of Covid pneumonia,hypertension, atrial fibrillation, chronic kidney disease, COPD not on home oxygen, GERD, hypothyroidism, iron deficiency anemia, irritable bowel syndrome, depression and impaired mobility currently at skilled nursing facility presented to the hospital with complaints of shortness of breath and low oxygen saturation at the facility. History was limited since patient was sent from the skilled nursing facility. There was also mention of some altered mental status. The nurse at the skilled nursing facility noted that she was altered and hypoxic and was placed on oxygen. Of note,patient was recently discharged from our hospital on 07/18/2019 after treatment for Covid pneumonia. She was weaned off oxygen prior to discharge and had received remdesivir and Solu-Medrol at that time for 4 days.At the time of my evaluation, patient denies any nausea vomiting abdominal pain. Feels thirsty. Denies any  chest pain palpitation. Denies fever chills.  ED Course: In the ED,WBC was elevated at 17.1. Potassium 3.3. Lactate was elevated at 2.5. Patientwas put on nonrebreather mask. X-ray done in the ED showed worsening atelectasis and infiltrates.Blood cultures were sent from the ED.In room air,her pulse ox was 70% in the ED. Patient was placed on nonrebreather and nasal cannula oxygen and admitted. Patient is not making any significant improvement. Palliative consulted. Patient and family has agreed for comfort care. Patient is on morphine infusion for comfort care.  Acute hypoxic respiratory failuresecondary to recent Covid PNA /suspected bacterial pneumonia:.She was treated with steroids and remdesivir for 4 days while in hospitaland discharged home.Suspectingpost Covid bacterial pneumonia.Patient with profound hypoxia needing nonrebreather with 15 L oxygen nasal cannula at 5 L.Prognosis poor. Contvancomycin cefepime, IV steroids, f/ublood culture.Sips and meds-N.p.o. pending speech eval unless patient is transitioned to comfort feeding.Palliative care consulted. Patient isvery lethargic,hasshallowbreathing,on 100% non-rebreather. Patient is requiring very high amount of oxygen.Family and patient has agreed for comfort care. Oxygen requirements are weaning down  Recently admitted and treatedCOVID Pneumonia. Inflammatory markers are high on no treatment for now.  Acute metabolicEncephalopathy / mild confusionin the setting of hypoxia / pneumonia: Hold sedatives for now.hold homeElavil,zolpidem.Patient isvery lethargic,hasshallowbreathing,on 100% non-rebreather.  Mild hypokalemia.resolved  SepsisPOA /lactic acidosis/leukocytosislikely from pneumonia and steroids,as well as profound hypoxia,possible sepsis DXI:PJASN excessive IV fluids due to underlying Covid pneumonia.Blood pressure is stable.D/Cvancomycin cefepime, IV steroids,  f/ublood culture.  History of COPD with hypoxic respiratory failure:Consider DCinhalers steroids supplemental oxygen bronchodilators and antibiotics.   History of chronic atrial fibrillation:Rate control continue home amiodarone, cardizem, not on AC.  History of myasthenia gravis.On mycophenolate.  Chronic kidney disease, stage II- creat stable.  Hyperthyroidism:cont methimazole.  Anxiety/depression:Hold sedatives for now.  GERD.Protonix  Goals of care:Had extensive discussion by admitting physician patient is DNR, overall prognosis guarded to very poor, palliative care has been consulted.  Patient became alert with less oxygen needs. She ate some lunch and visited with her son. She then expired a few hours later.   Pertinent Labs and Studies  Significant Diagnostic Studies DG Chest Port 1 View  Result Date: 07/22/2019 CLINICAL DATA:  Cough and congestion. EXAM: PORTABLE CHEST 1 VIEW COMPARISON:  July 14, 2019 FINDINGS: Moderate to marked severity areas of atelectasis and/or infiltrate are seen within the bilateral lung bases, right greater than left. There are small bilateral pleural effusions. No pneumothorax is identified. The cardiac silhouette is mildly enlarged and unchanged in size. There is moderate severity calcification of the thoracic aorta. Degenerative changes seen throughout the thoracic spine. IMPRESSION: 1. Moderate to marked severity bibasilar atelectasis and/or infiltrate, right greater than left. 2. Small bilateral pleural effusions. Electronically Signed   By: Virgina Norfolk M.D.   On: 07/22/2019 01:56   DG Chest Port 1 View  Result Date: 07/14/2019 CLINICAL DATA:  Cough. EXAM: PORTABLE CHEST 1 VIEW COMPARISON:  August 25, 2018. FINDINGS: Stable cardiomegaly. No pneumothorax or pleural effusion is noted. Minimal bibasilar subsegmental atelectasis is noted. Bony thorax is unremarkable. IMPRESSION: Minimal bibasilar subsegmental atelectasis.  Electronically Signed   By: Marijo Conception M.D.   On: 07/14/2019 15:03    Microbiology Recent Results (from the past 240 hour(s))  Culture, blood (routine x 2)     Status: Abnormal   Collection Time: 07/22/19  5:30 AM   Specimen: BLOOD RIGHT HAND  Result Value Ref Range Status   Specimen Description   Final    BLOOD RIGHT HAND Performed at [facility], [place] [address], [place], [place] [address]    Special Requests   Final    BOTTLES DRAWN AEROBIC ONLY Blood Culture adequate volume Performed at [facility] [address] [place], [place] [address]    Culture  Setup Time   Final    GRAM POSITIVE COCCI IN CLUSTERS AEROBIC BOTTLE ONLY CRITICAL RESULT CALLED TO, READ BACK BY AND VERIFIED WITH: Christean Grief PharmD 16:25 07/23/19 ([email or web address]) Performed at [facility], [place] [address], [place], [place] [address]    Culture STAPHYLOCOCCUS WARNERI (A)  Final   Report Status 07/25/2019 FINAL  Final   Organism ID, Bacteria STAPHYLOCOCCUS WARNERI  Final      Susceptibility   Staphylococcus warneri - MIC*    CIPROFLOXACIN >=8 RESISTANT Resistant     ERYTHROMYCIN >=8 RESISTANT Resistant     GENTAMICIN <=0.5 SENSITIVE Sensitive     OXACILLIN >=4 RESISTANT Resistant     TETRACYCLINE 2 SENSITIVE Sensitive     VANCOMYCIN 1 SENSITIVE Sensitive     TRIMETH/SULFA <=10 SENSITIVE Sensitive     CLINDAMYCIN RESISTANT Resistant     RIFAMPIN <=0.5 SENSITIVE Sensitive     Inducible Clindamycin POSITIVE Resistant     * STAPHYLOCOCCUS WARNERI  Culture, blood (routine x 2)     Status: Abnormal   Collection Time: 07/22/19  5:30 AM   Specimen: BLOOD  Result Value Ref Range Status   Specimen Description   Final    BLOOD BLOOD RIGHT FOREARM Performed at [facility] [address] [place], [place] [address]    Special Requests   Final    BOTTLES DRAWN AEROBIC AND ANAEROBIC Blood Culture adequate volume Performed at [facility] [address] [place], [place] [address]    Culture  Setup Time   Final    AEROBIC BOTTLE ONLY GRAM POSITIVE COCCI IN CLUSTERS CRITICAL VALUE NOTED.  VALUE IS CONSISTENT WITH PREVIOUSLY REPORTED AND CALLED VALUE.    Culture (A)  Final    STAPHYLOCOCCUS WARNERI SUSCEPTIBILITIES PERFORMED ON PREVIOUS CULTURE WITHIN THE LAST 5 DAYS. Performed at [facility], [address] [place], [place] [address]    Report Status 07/25/2019 FINAL  Final  Blood Culture ID Panel (Reflexed)     Status: Abnormal   Collection Time: 07/22/19  5:30 AM  Result Value Ref Range Status   Enterococcus species NOT DETECTED NOT DETECTED Final   Listeria monocytogenes NOT DETECTED NOT DETECTED Final   Staphylococcus species DETECTED (A) NOT DETECTED Final    Comment: Methicillin (oxacillin) resistant coagulase negative staphylococcus. Possible blood culture contaminant (unless isolated from more than one blood culture draw or clinical case suggests pathogenicity). No antibiotic treatment is indicated for blood  culture contaminants. CRITICAL RESULT CALLED TO, READ BACK BY AND VERIFIED WITH: Azzie Glatter PharmD 16:25 07/23/19 ([email or web address])    Staphylococcus aureus (BCID) NOT DETECTED NOT DETECTED Final   Methicillin resistance DETECTED (A) NOT DETECTED Final    Comment: CRITICAL RESULT CALLED TO, READ BACK BY AND VERIFIED WITH: Azzie Glatter PharmD 16:25 07/23/19 ([email or web address])    Streptococcus species NOT DETECTED NOT DETECTED Final   Streptococcus agalactiae NOT DETECTED NOT DETECTED Final   Streptococcus pneumoniae NOT DETECTED NOT DETECTED Final   Streptococcus pyogenes NOT DETECTED NOT DETECTED Final   Acinetobacter baumannii NOT DETECTED NOT DETECTED Final   Enterobacteriaceae species NOT DETECTED NOT DETECTED Final   Enterobacter cloacae complex NOT DETECTED NOT DETECTED Final   Escherichia coli NOT DETECTED NOT DETECTED Final   Klebsiella oxytoca NOT DETECTED NOT DETECTED Final   Klebsiella pneumoniae  NOT DETECTED NOT DETECTED Final   Proteus species NOT DETECTED NOT DETECTED Final   Serratia marcescens NOT DETECTED NOT DETECTED Final   Haemophilus influenzae NOT DETECTED NOT DETECTED Final   Neisseria meningitidis NOT DETECTED NOT DETECTED Final   Pseudomonas aeruginosa NOT DETECTED NOT DETECTED Final   Candida albicans NOT DETECTED NOT DETECTED Final   Candida glabrata NOT DETECTED NOT DETECTED Final   Candida krusei NOT DETECTED NOT DETECTED Final   Candida parapsilosis NOT DETECTED NOT DETECTED Final   Candida tropicalis NOT DETECTED NOT DETECTED Final    Comment: Performed at [facility], [address] [place], [place] [address]    Lab Basic Metabolic Panel: Recent Labs  Lab 07/22/19 0106 07/23/19 0153  NA 136 137  K 3.3* 3.5  CL 100 104  CO2 25 25  GLUCOSE 118* 175*  BUN 20 17  CREATININE 0.53 0.40*  CALCIUM 8.2* 8.2*  MG  --  2.2   Liver Function Tests: Recent Labs  Lab 07/22/19 0106 07/23/19 0153  AST 16 11*  ALT 40 27  ALKPHOS 64 61  BILITOT 0.9 0.7  PROT 6.5 5.7*  ALBUMIN 3.1* 2.5*   CBC: Recent Labs  Lab 07/22/19 0106 07/23/19 0153  WBC 17.1* 18.7*  NEUTROABS 15.0* 17.7*  HGB 14.6 13.4  HCT 47.2* 43.0  MCV 91.5 91.9  PLT 144* 124*   Sepsis Labs: Recent Labs  Lab 07/22/19 0106 07/22/19 0530 07/22/19 1859 07/23/19 0153  PROCALCITON  --   --  0.55  --   WBC 17.1*  --   --  18.7*  LATICACIDVEN  --  2.5*  --   --  Procedures/Operations  None   Reva Bores 07/28/2019, 9:56 AM

## 2019-08-25 NOTE — Plan of Care (Signed)
°  Problem: Coping: °Goal: Level of anxiety will decrease °Outcome: Progressing °  °

## 2019-08-25 DEATH — deceased

## 2019-09-01 IMAGING — CT CT ABD-PELV W/ CM
2 of 5 series · 16 of 46 positions shown, 18 images · IV contrast (ISOVUE)
Comparison: None.

CLINICAL DATA: Intermittent nausea, vomiting, and diarrhea for the
past 2 weeks, worsened over the past 3 days. Generalized abdominal
pain.

EXAM:
CT ABDOMEN AND PELVIS WITH CONTRAST
TECHNIQUE: Multidetector CT imaging of the abdomen and pelvis was performed
using the standard protocol following bolus administration of
intravenous contrast.
CONTRAST:  100mL RWJCEJ-IXX IOPAMIDOL (RWJCEJ-IXX) INJECTION 61%

[Series 2: axial st · axial · 0.81mm/px · z∈[-753,-408]mm · 13 of 81 slices shown, 15 images]
[im 6/81  soft-tissue]
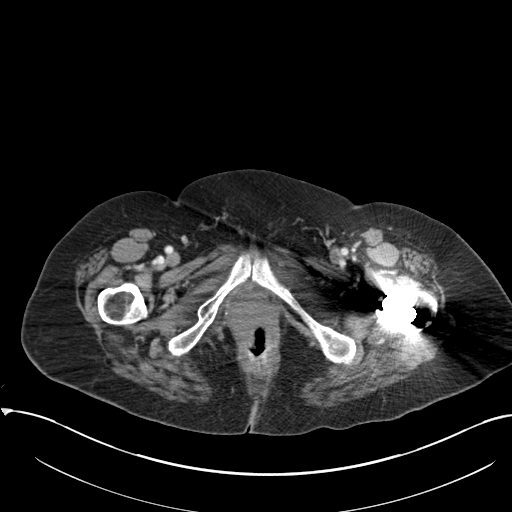
[im 6/81  bone]
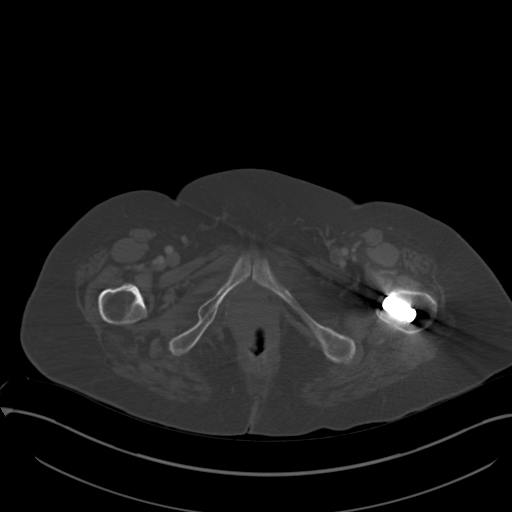
[im 11/81  soft-tissue]
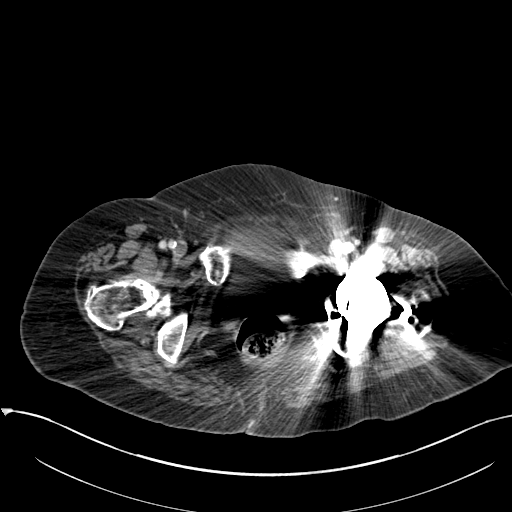
[im 17/81  soft-tissue]
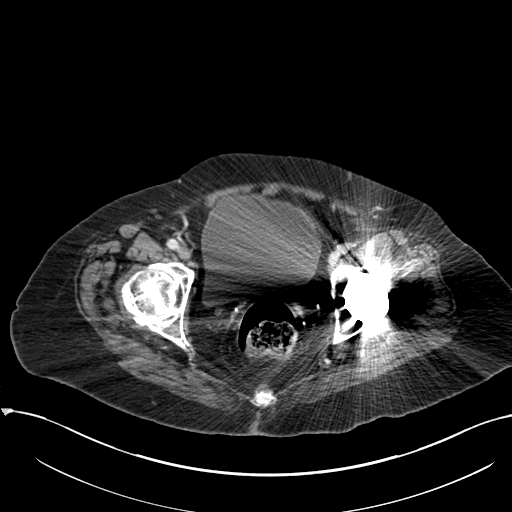
[im 22/81  soft-tissue]
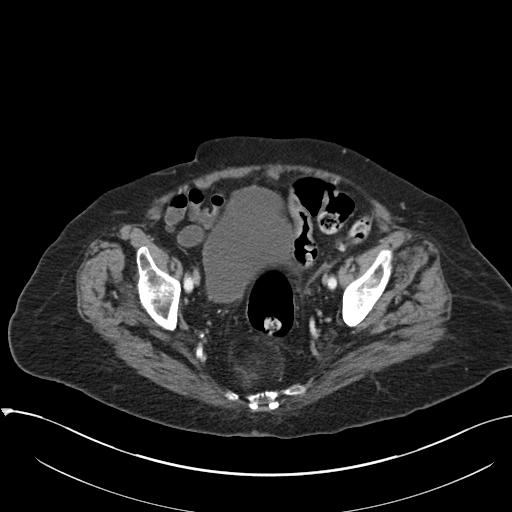
[im 27/81  soft-tissue]
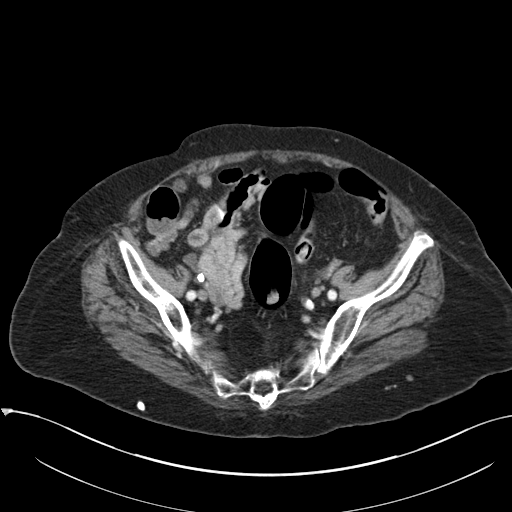
[im 33/81  soft-tissue]
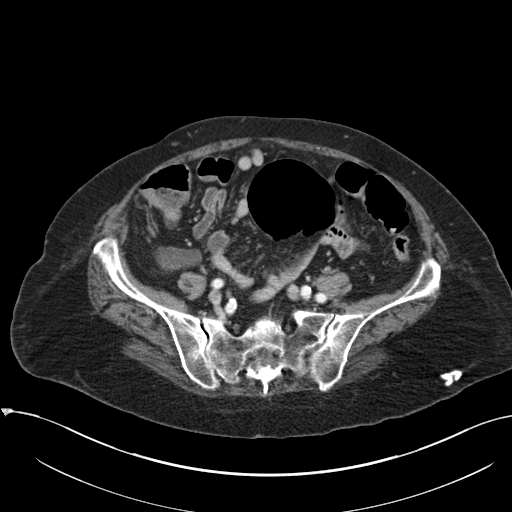
[im 43/81  soft-tissue]
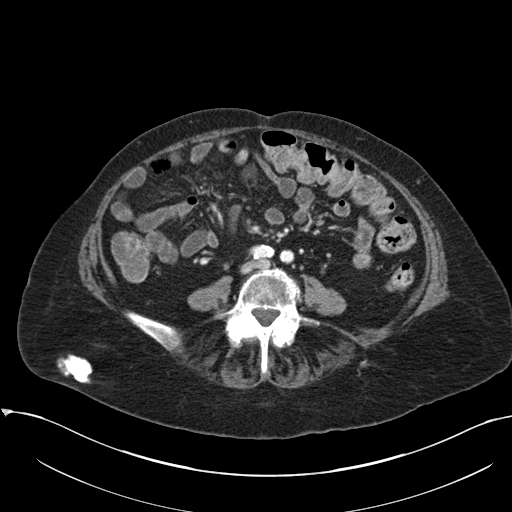
[im 49/81  soft-tissue]
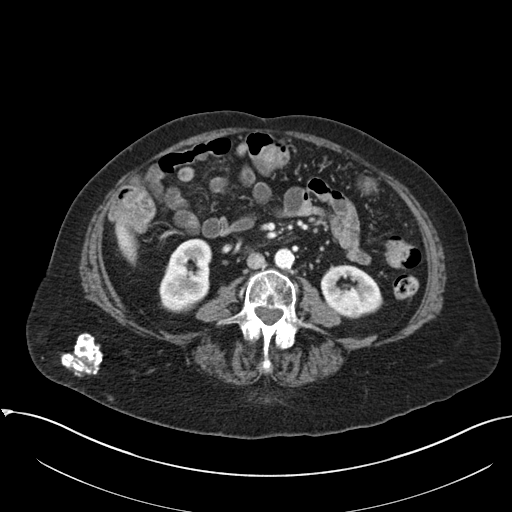
[im 54/81  soft-tissue]
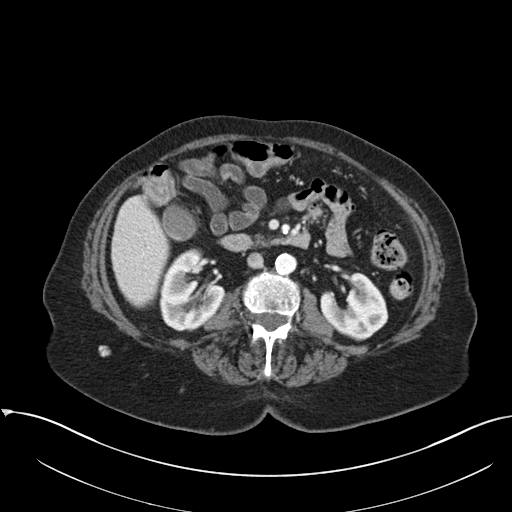
[im 54/81  bone]
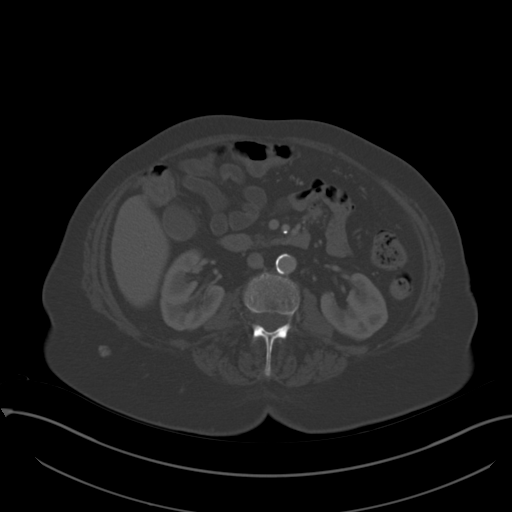
[im 59/81  soft-tissue]
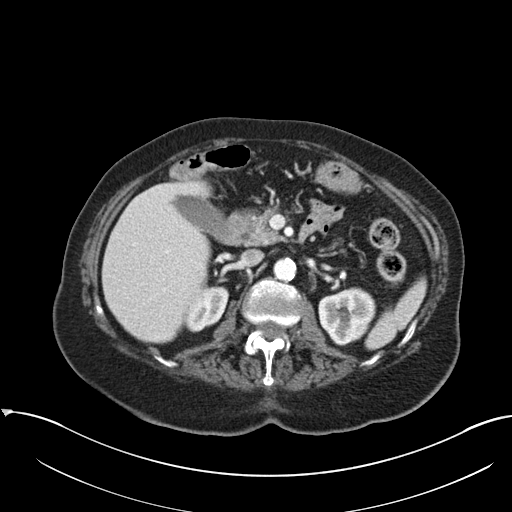
[im 65/81  soft-tissue]
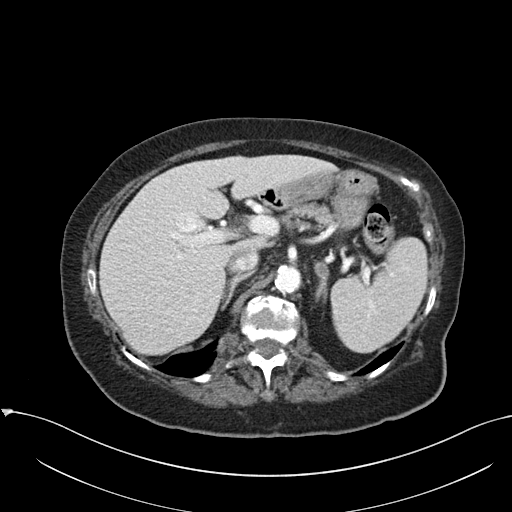
[im 70/81  soft-tissue]
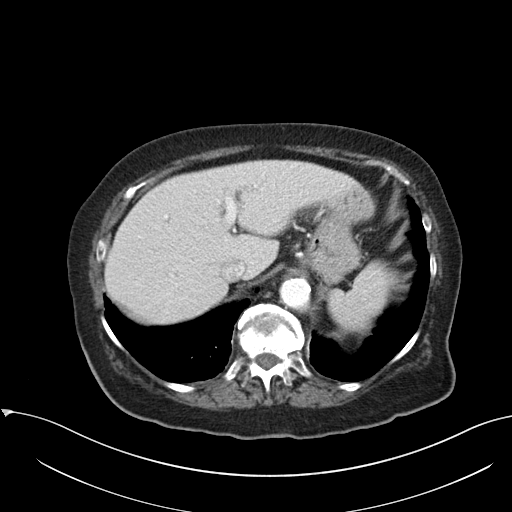
[im 75/81  soft-tissue]
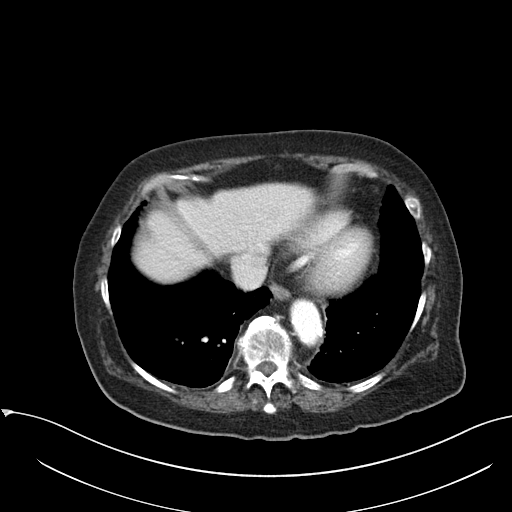

[Series 5: coronal st · coronal · 0.78mm/px · 3 of 92 slices shown]
[im 31/92  soft-tissue]
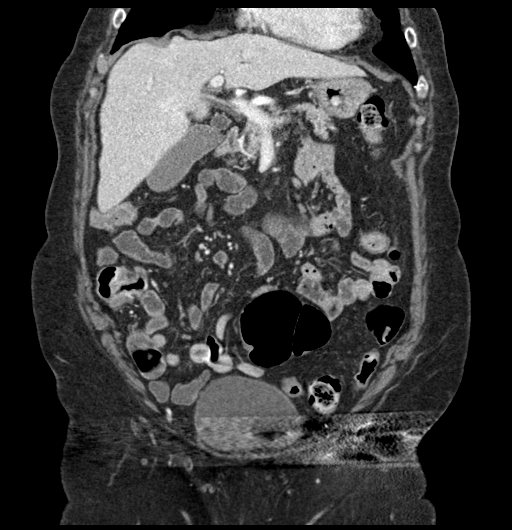
[im 41/92  soft-tissue]
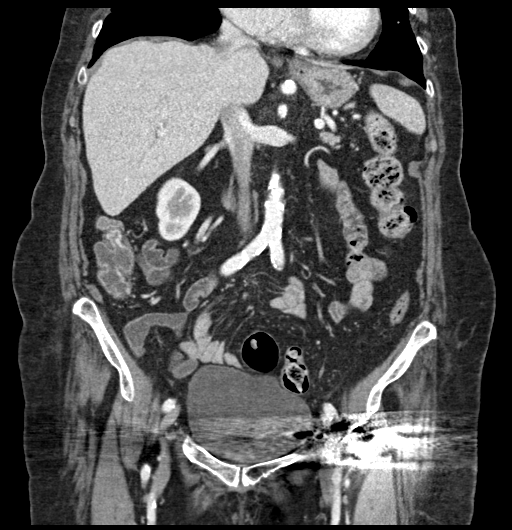
[im 51/92  soft-tissue]
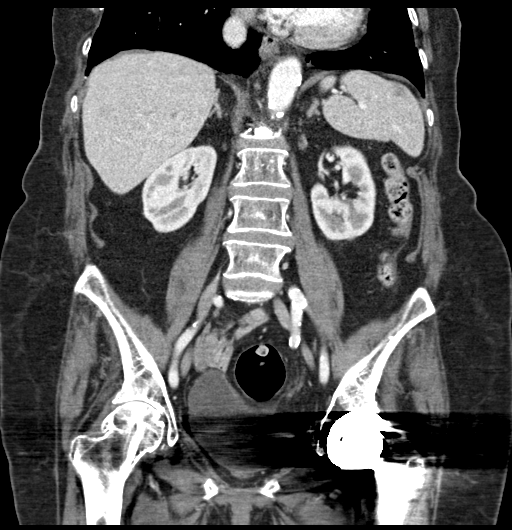

[16 of 46 positions shown; findings below may reference images not displayed]

FINDINGS: Lower chest: No acute abnormality.  Scarring at the lung bases.

Hepatobiliary: No focal liver abnormality is seen. No gallstones,
gallbladder wall thickening, or biliary dilatation.

Pancreas: Mild atrophy. No ductal dilatation or surrounding
inflammatory changes.

Spleen: Normal in size. There is a 1.9 cm indeterminate, enhancing
lesion within the spleen.

Adrenals/Urinary Tract: There is a 1.1 cm indeterminate lesion
within the left adrenal gland. The right adrenal gland is
unremarkable. The bilateral kidneys are unremarkable. No renal or
ureteral calculi. No hydronephrosis. The bladder is unremarkable.

Stomach/Bowel: Stomach is within normal limits. Appendix is not
visualized in this patient with a history of prior appendectomy. No
evidence of bowel wall thickening, distention, or inflammatory
changes.

Vascular/Lymphatic: Aortic atherosclerosis. No enlarged abdominal or
pelvic lymph nodes.

Reproductive: Status post hysterectomy. No adnexal masses.

Other: No abdominal wall hernia or abnormality. No abdominopelvic
ascites. No pneumoperitoneum.

Musculoskeletal: Prior left total hip arthroplasty. Old healed right
superior and inferior pubic rami fractures. Chronic compression
deformities of T12 and L1. Prior cement augmentation of T12 and L2.
Moderate osteoarthritis of the right hip.
IMPRESSION: 1.  No acute intra-abdominal process.
2. 1.9 cm indeterminate lesion within the spleen. Recommend
follow-up contrast-enhanced MRI in 6-12 months for further
evaluation. This recommendation follows ACR consensus guidelines:
White Paper of the ACR Incidental Findings Committee II on Splenic
and Nodal Findings. [HOSPITAL] 4229;[DATE].
3. Indeterminate 1.1 cm nodule within the left adrenal gland,
probably a benign adenoma. Consider follow-up adrenal protocol CT in
12 months for further evaluation. This recommendation follows ACR
consensus guidelines: Management of Incidental Adrenal Masses: A
White Paper of the ACR Incidental Findings Committee. [HOSPITAL] 0689;14:[id number].
4.  Aortic atherosclerosis ([id number]).
